# Patient Record
Sex: Female | Born: 1965
Health system: Northeastern US, Community
[De-identification: ages and names within clinical notes are randomized; demographics above are authoritative.]

## PROBLEM LIST (undated history)

## (undated) ENCOUNTER — Ambulatory Visit (HOSPITAL_BASED_OUTPATIENT_CLINIC_OR_DEPARTMENT_OTHER): Payer: Self-pay | Source: Ambulatory Visit | Admitting: Gastroenterology

## (undated) DIAGNOSIS — I471 Supraventricular tachycardia: Secondary | ICD-10-CM

## (undated) DIAGNOSIS — H5200 Hypermetropia, unspecified eye: Secondary | ICD-10-CM

## (undated) DIAGNOSIS — Z973 Presence of spectacles and contact lenses: Secondary | ICD-10-CM

## (undated) DIAGNOSIS — K219 Gastro-esophageal reflux disease without esophagitis: Secondary | ICD-10-CM

## (undated) DIAGNOSIS — N926 Irregular menstruation, unspecified: Secondary | ICD-10-CM

## (undated) DIAGNOSIS — H524 Presbyopia: Secondary | ICD-10-CM

## (undated) DIAGNOSIS — Z331 Pregnant state, incidental: Secondary | ICD-10-CM

## (undated) DIAGNOSIS — I519 Heart disease, unspecified: Secondary | ICD-10-CM

## (undated) DIAGNOSIS — H52203 Unspecified astigmatism, bilateral: Secondary | ICD-10-CM

## (undated) DIAGNOSIS — H31012 Macula scars of posterior pole (postinflammatory) (post-traumatic), left eye: Principal | ICD-10-CM

## (undated) DIAGNOSIS — H5203 Hypermetropia, bilateral: Secondary | ICD-10-CM

## (undated) HISTORY — DX: Supraventricular tachycardia: I47.1

## (undated) HISTORY — DX: Presbyopia: H52.4

## (undated) HISTORY — DX: Unspecified astigmatism, bilateral: H52.203

## (undated) HISTORY — DX: Hypermetropia, bilateral: H52.03

## (undated) HISTORY — DX: Macula scars of posterior pole (postinflammatory) (post-traumatic), left eye: H31.012

## (undated) HISTORY — DX: Hypermetropia, unspecified eye: H52.00

## (undated) HISTORY — PX: MA BREAST SPECIMEN: RAD3008

## (undated) HISTORY — DX: Heart disease, unspecified: I51.9

## (undated) HISTORY — PX: SEPTOPLASTY/SUBMUCOUS RESECJ W/WO CARTILAGE GRF: ENT31

## (undated) HISTORY — PX: BREAST MASS EXCISION: SHX1267

## (undated) HISTORY — PX: CESAREAN DELIVERY ONLY: REP300

## (undated) HISTORY — DX: Pregnant state, incidental: Z33.1

## (undated) HISTORY — DX: Irregular menstruation, unspecified: N92.6

## (undated) HISTORY — DX: Presence of spectacles and contact lenses: Z97.3

---
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
## 1999-04-20 ENCOUNTER — Ambulatory Visit (HOSPITAL_BASED_OUTPATIENT_CLINIC_OR_DEPARTMENT_OTHER): Payer: Self-pay

## 1999-04-20 LAB — URINALYSIS
BILIRUBIN, URINE: NEGATIVE
GLUCOSE, URINE: NEGATIVE MG/DL
KETONE, URINE: NEGATIVE MG/DL
LEUKOCYTE ESTERASE: NEGATIVE
NITRITE, URINE: NEGATIVE
OCCULT BLOOD, URINE: NEGATIVE
PH URINE: 5 (ref 5.0–8.0)
PROTEIN, URINE: NEGATIVE MG/DL
SPECIFIC GRAVITY URINE: 1.015 (ref 1.003–1.030)

## 1999-04-20 LAB — AUTOMATED WBC DIFF
BASOPHIL %: 0.5 % (ref 0–2)
EOSINOPHIL %: 0.8 % (ref 0–7)
LYMPHOCYTE %: 25.1 % (ref 12–39)
MONOCYTE %: 3.7 % (ref 1–12)
NEUTROPHIL %: 69.9 % (ref 46–79)

## 1999-04-20 LAB — BLOOD COUNT COMPLETE AUTOMATED
HEMATOCRIT: 40.9 % (ref 37.0–47.0)
HEMOGLOBIN: 14 g/dL (ref 12.0–16.0)
MEAN CORP HGB CONC: 34.3 g/dL (ref 32.0–36.0)
MEAN CORPUSCULAR HGB: 31.3 pg — ABNORMAL HIGH (ref 27.0–31.0)
MEAN CORPUSCULAR VOL: 91.3 fL (ref 81.0–99.0)
MEAN PLATELET VOLUME: 9.3 fL (ref 8.0–12.0)
PLATELET COUNT: 213 10*3/uL (ref 150–400)
RBC DISTRIBUTION WIDTH: 11.6 % (ref 11.5–14.3)
RED BLOOD CELL COUNT: 4.48 MIL/uL (ref 4.20–5.40)
WHITE BLOOD CELL COUNT: 9.4 10*3/uL (ref 4.8–10.8)

## 1999-04-20 LAB — HCG QUALITATIVE SERUM: HCG QUALITATIVE SERUM: NEGATIVE

## 1999-04-20 LAB — CHOLESTEROL SERUM/WHOLE BLOOD TOTAL: Cholesterol: 180 mg/dl (ref ?–200)

## 1999-04-20 LAB — RPR: RPR: NONREACTIVE

## 1999-04-20 LAB — THYROID SCREEN TSH REFLEX FT4: THYROID SCREEN TSH REFLEX FT4: 0.38 u[IU]/mL (ref 0.34–5.60)

## 1999-04-20 LAB — URINE CULTURE/COLONY COUNT: URINE CULTURE/COLONY COUNT: NO GROWTH

## 1999-04-27 ENCOUNTER — Ambulatory Visit (HOSPITAL_BASED_OUTPATIENT_CLINIC_OR_DEPARTMENT_OTHER): Payer: Self-pay

## 1999-04-27 LAB — DHEA SULFATE  (SENDOUT): DHEA SULFATE: 128 ug/dL (ref 45–270)

## 1999-04-27 LAB — IADNA CHLAMYDIA TRACHOMATIS DIRECT PROBE TQ: GENPROBE CHLAMYDIA: NEGATIVE

## 1999-04-27 LAB — ASSAY OF TESTOSTERONE  (SENDOUT)

## 1999-04-27 LAB — IADNA NEISSERIA GONORRHOEAE DIRECT PROBE TQ: GENPROBE GC: NEGATIVE

## 1999-04-27 LAB — CHG CYTP SLIDES CERV/VAG MNL SCRN PHYSICIAN SUPV

## 1999-04-27 LAB — ESTRADIOL  (SENDOUT): ESTRADIOL: 148 pg/ml (ref 19–528)

## 1999-04-27 LAB — CHG GONADOTROPIN LUTEINIZING HORMONE: LUTEINIZING HORMONE (LH): 75.81 m[IU]/mL

## 1999-04-27 LAB — HCG QUALITATIVE SERUM: HCG QUALITATIVE SERUM: NEGATIVE

## 1999-04-27 LAB — CHG GONADOTROPIN FOLLICLE STIMULATING HORMONE: FOLLICLE STIMULATING HORMONE: 33.34 m[IU]/mL

## 1999-10-06 ENCOUNTER — Ambulatory Visit (HOSPITAL_BASED_OUTPATIENT_CLINIC_OR_DEPARTMENT_OTHER): Payer: Self-pay

## 1999-10-06 LAB — RUBELLA IGG ANTIBODY: RUBELLA: NON-IMMUNE/NOT IMMUNE

## 1999-10-06 LAB — RPR: RPR: NONREACTIVE

## 2000-01-04 ENCOUNTER — Other Ambulatory Visit: Payer: Self-pay

## 2000-01-04 ENCOUNTER — Emergency Department (HOSPITAL_BASED_OUTPATIENT_CLINIC_OR_DEPARTMENT_OTHER): Payer: Self-pay

## 2000-01-04 LAB — AUTOMATED WBC DIFF
BASOPHIL %: 0.5 % (ref 0–2)
EOSINOPHIL %: 1.3 % (ref 0–7)
LYMPHOCYTE %: 27.2 % (ref 12–39)
MONOCYTE %: 4.5 % (ref 1–12)
NEUTROPHIL %: 66.5 % (ref 46–79)

## 2000-01-04 LAB — COMPLETE BLOOD COUNT
HEMATOCRIT: 35.5 % — ABNORMAL LOW (ref 37.0–47.0)
HEMOGLOBIN: 12.5 g/dL (ref 12.0–16.0)
MEAN CORP HGB CONC: 35.2 g/dL (ref 32.0–36.0)
MEAN CORPUSCULAR HGB: 32.2 pg — ABNORMAL HIGH (ref 27.0–31.0)
MEAN CORPUSCULAR VOL: 91.5 fL (ref 81.0–99.0)
RBC DISTRIBUTION WIDTH: 11.6 % (ref 11.5–14.3)
RED BLOOD CELL COUNT: 3.88 MIL/uL — ABNORMAL LOW (ref 4.20–5.40)
WHITE BLOOD CELL COUNT: 11.5 10*3/uL — ABNORMAL HIGH (ref 4.8–10.8)

## 2000-01-04 LAB — BASIC METABOLIC PANEL
BUN (UREA NITROGEN): 13 mg/dl (ref 10–20)
CALCIUM: 9.4 mg/dl (ref 8.5–10.5)
CARBON DIOXIDE: 23 mEQ/L (ref 22–32)
CHLORIDE: 106 mEQ/L (ref 98–110)
CREATININE: 0.5 mg/dl — ABNORMAL LOW (ref 0.8–1.2)
Glucose Random: 98 mg/dl (ref 65–160)
POTASSIUM: 3.8 mEQ/L (ref 3.5–5.0)
SODIUM: 137 mEQ/L (ref 135–145)

## 2000-01-04 LAB — TYPE AND SCREEN

## 2000-01-04 LAB — HCG QUALITATIVE SERUM: HCG QUALITATIVE SERUM: POSITIVE — ABNORMAL HIGH

## 2000-01-04 LAB — PATIENT ABO/RH CONFIRM

## 2000-01-04 LAB — HCG QUANTITATIVE: HCG QUANTITATIVE: 30654 m[IU]/mL — ABNORMAL HIGH (ref 1–3)

## 2000-01-05 ENCOUNTER — Other Ambulatory Visit: Payer: Self-pay | Admitting: Obstetrics & Gynecology

## 2000-01-11 ENCOUNTER — Ambulatory Visit (HOSPITAL_BASED_OUTPATIENT_CLINIC_OR_DEPARTMENT_OTHER): Payer: Self-pay | Admitting: Obstetrics & Gynecology

## 2000-01-11 LAB — IADNA CHLAMYDIA TRACHOMATIS DIRECT PROBE TQ: GENPROBE CHLAMYDIA: NEGATIVE

## 2000-01-11 LAB — CHG CYTP SLIDES CERV/VAG MNL SCRN PHYSICIAN SUPV

## 2000-01-11 LAB — URINE CULTURE/COLONY COUNT: PROCEDURE PROMPT: NO GROWTH

## 2000-01-11 LAB — IADNA NEISSERIA GONORRHOEAE DIRECT PROBE TQ: GENPROBE GC: NEGATIVE

## 2000-01-20 ENCOUNTER — Ambulatory Visit (HOSPITAL_BASED_OUTPATIENT_CLINIC_OR_DEPARTMENT_OTHER): Payer: Self-pay | Admitting: Obstetrics & Gynecology

## 2000-01-20 LAB — ALPHA FETOPROTEIN PLUS PROFILE
AFP MoM VALUE: 0.49
AFP Value (EIA): 16.6 ng/ml
DOWN SYND 2ND TRIMESTER RISK: 1:1218 {titer}
DOWN SYND MATERNAL AGE RISK: 1:223 {titer}
GESTATIONAL AGE: 15.3 WEEKS
HCG MoM: 0.59
HCG Value: 21351 m[IU]/mL
MATERNAL AGE AT EDD: 36.3 YEARS
MATERNAL WEIGHT: 157 [lb_av]
OPEN SPINA BIFIDA RISK FACTOR: 1:10000 {titer}
UNCONJ ESTRIOL MoM: 0.43
UNCONJUGATED ESTRIOL VALUE: 1 ng/ml

## 2000-01-20 LAB — URINE CULTURE/COLONY COUNT: URINE CULTURE/COLONY COUNT: NO GROWTH

## 2000-01-27 ENCOUNTER — Ambulatory Visit (HOSPITAL_BASED_OUTPATIENT_CLINIC_OR_DEPARTMENT_OTHER): Payer: Self-pay | Admitting: Obstetrics & Gynecology

## 2000-02-02 ENCOUNTER — Ambulatory Visit (HOSPITAL_BASED_OUTPATIENT_CLINIC_OR_DEPARTMENT_OTHER): Payer: Self-pay | Admitting: Obstetrics & Gynecology

## 2000-02-15 ENCOUNTER — Ambulatory Visit (HOSPITAL_BASED_OUTPATIENT_CLINIC_OR_DEPARTMENT_OTHER): Payer: Self-pay | Admitting: Obstetrics & Gynecology

## 2000-03-14 ENCOUNTER — Ambulatory Visit (HOSPITAL_BASED_OUTPATIENT_CLINIC_OR_DEPARTMENT_OTHER): Payer: Self-pay | Admitting: Obstetrics & Gynecology

## 2000-04-13 ENCOUNTER — Ambulatory Visit (HOSPITAL_BASED_OUTPATIENT_CLINIC_OR_DEPARTMENT_OTHER): Payer: Self-pay | Admitting: Obstetrics & Gynecology

## 2000-04-25 ENCOUNTER — Ambulatory Visit (HOSPITAL_BASED_OUTPATIENT_CLINIC_OR_DEPARTMENT_OTHER): Payer: Self-pay | Admitting: Obstetrics & Gynecology

## 2000-05-07 ENCOUNTER — Ambulatory Visit: Payer: Self-pay | Admitting: Orthopaedic Surgery

## 2000-05-23 ENCOUNTER — Ambulatory Visit (HOSPITAL_BASED_OUTPATIENT_CLINIC_OR_DEPARTMENT_OTHER): Payer: Self-pay | Admitting: Obstetrics & Gynecology

## 2000-06-06 ENCOUNTER — Other Ambulatory Visit (HOSPITAL_BASED_OUTPATIENT_CLINIC_OR_DEPARTMENT_OTHER): Payer: Self-pay | Admitting: Obstetrics & Gynecology

## 2000-06-08 ENCOUNTER — Ambulatory Visit (HOSPITAL_BASED_OUTPATIENT_CLINIC_OR_DEPARTMENT_OTHER): Payer: Self-pay | Admitting: Obstetrics & Gynecology

## 2000-06-13 ENCOUNTER — Ambulatory Visit (HOSPITAL_BASED_OUTPATIENT_CLINIC_OR_DEPARTMENT_OTHER): Payer: Self-pay | Admitting: Obstetrics & Gynecology

## 2000-06-15 ENCOUNTER — Ambulatory Visit (HOSPITAL_BASED_OUTPATIENT_CLINIC_OR_DEPARTMENT_OTHER): Payer: Self-pay | Admitting: Obstetrics & Gynecology

## 2000-06-22 ENCOUNTER — Ambulatory Visit (HOSPITAL_BASED_OUTPATIENT_CLINIC_OR_DEPARTMENT_OTHER): Payer: Self-pay | Admitting: Obstetrics & Gynecology

## 2000-06-27 ENCOUNTER — Ambulatory Visit (HOSPITAL_BASED_OUTPATIENT_CLINIC_OR_DEPARTMENT_OTHER): Payer: Self-pay | Admitting: Obstetrics & Gynecology

## 2000-07-02 ENCOUNTER — Ambulatory Visit (HOSPITAL_BASED_OUTPATIENT_CLINIC_OR_DEPARTMENT_OTHER): Payer: Self-pay | Admitting: Obstetrics & Gynecology

## 2000-07-02 ENCOUNTER — Other Ambulatory Visit (HOSPITAL_BASED_OUTPATIENT_CLINIC_OR_DEPARTMENT_OTHER): Payer: Self-pay | Admitting: Obstetrics & Gynecology

## 2000-07-02 LAB — TYPE AND SCREEN

## 2000-07-02 LAB — ~~LOC~~-OPERATIVE REPORT

## 2000-07-06 LAB — PERFUSION LUNG IMAGES

## 2000-07-06 LAB — XR CHEST 2 VIEWS

## 2000-07-10 ENCOUNTER — Ambulatory Visit (HOSPITAL_BASED_OUTPATIENT_CLINIC_OR_DEPARTMENT_OTHER): Payer: Self-pay

## 2000-07-11 ENCOUNTER — Ambulatory Visit (HOSPITAL_BASED_OUTPATIENT_CLINIC_OR_DEPARTMENT_OTHER): Payer: Self-pay | Admitting: Plastic Surgery

## 2000-07-13 ENCOUNTER — Ambulatory Visit (HOSPITAL_BASED_OUTPATIENT_CLINIC_OR_DEPARTMENT_OTHER): Payer: Self-pay | Admitting: Obstetrics & Gynecology

## 2000-07-13 ENCOUNTER — Ambulatory Visit (HOSPITAL_BASED_OUTPATIENT_CLINIC_OR_DEPARTMENT_OTHER): Payer: Self-pay

## 2000-07-16 LAB — XR CHEST 2 VIEWS

## 2000-07-18 ENCOUNTER — Ambulatory Visit (HOSPITAL_BASED_OUTPATIENT_CLINIC_OR_DEPARTMENT_OTHER): Payer: Self-pay | Admitting: Obstetrics & Gynecology

## 2000-07-31 ENCOUNTER — Ambulatory Visit (HOSPITAL_BASED_OUTPATIENT_CLINIC_OR_DEPARTMENT_OTHER): Payer: Self-pay

## 2000-07-31 ENCOUNTER — Emergency Department (HOSPITAL_BASED_OUTPATIENT_CLINIC_OR_DEPARTMENT_OTHER): Payer: Self-pay | Admitting: Emergency Medical Services

## 2000-08-01 ENCOUNTER — Ambulatory Visit (HOSPITAL_BASED_OUTPATIENT_CLINIC_OR_DEPARTMENT_OTHER): Payer: Self-pay | Admitting: Plastic Surgery

## 2000-08-15 ENCOUNTER — Other Ambulatory Visit (HOSPITAL_BASED_OUTPATIENT_CLINIC_OR_DEPARTMENT_OTHER): Payer: Self-pay

## 2000-08-15 ENCOUNTER — Ambulatory Visit (HOSPITAL_BASED_OUTPATIENT_CLINIC_OR_DEPARTMENT_OTHER): Payer: Self-pay | Admitting: Obstetrics & Gynecology

## 2000-08-15 LAB — IADNA NEISSERIA GONORRHOEAE DIRECT PROBE TQ: GENPROBE GC: NEGATIVE

## 2000-08-15 LAB — CYTOPATH, C/V, THIN LAYER

## 2000-08-15 LAB — IADNA CHLAMYDIA TRACHOMATIS DIRECT PROBE TQ: GENPROBE CHLAMYDIA: NEGATIVE

## 2000-08-22 ENCOUNTER — Ambulatory Visit (HOSPITAL_BASED_OUTPATIENT_CLINIC_OR_DEPARTMENT_OTHER): Payer: Self-pay | Admitting: Plastic Surgery

## 2000-08-24 ENCOUNTER — Ambulatory Visit (HOSPITAL_BASED_OUTPATIENT_CLINIC_OR_DEPARTMENT_OTHER): Payer: Self-pay

## 2000-09-19 ENCOUNTER — Ambulatory Visit (HOSPITAL_BASED_OUTPATIENT_CLINIC_OR_DEPARTMENT_OTHER): Payer: Self-pay | Admitting: Plastic Surgery

## 2000-10-19 ENCOUNTER — Ambulatory Visit (HOSPITAL_BASED_OUTPATIENT_CLINIC_OR_DEPARTMENT_OTHER): Payer: Self-pay

## 2001-01-03 ENCOUNTER — Ambulatory Visit (HOSPITAL_BASED_OUTPATIENT_CLINIC_OR_DEPARTMENT_OTHER): Payer: Self-pay

## 2001-01-15 ENCOUNTER — Ambulatory Visit (HOSPITAL_BASED_OUTPATIENT_CLINIC_OR_DEPARTMENT_OTHER): Payer: Self-pay

## 2001-03-04 ENCOUNTER — Ambulatory Visit (HOSPITAL_BASED_OUTPATIENT_CLINIC_OR_DEPARTMENT_OTHER): Payer: Self-pay | Admitting: Plastic Surgery

## 2001-07-30 ENCOUNTER — Ambulatory Visit (HOSPITAL_BASED_OUTPATIENT_CLINIC_OR_DEPARTMENT_OTHER): Payer: Self-pay

## 2001-07-30 LAB — IADNA CHLAMYDIA TRACHOMATIS DIRECT PROBE TQ: GENPROBE CHLAMYDIA: NEGATIVE

## 2001-07-30 LAB — CYTOPATH, C/V, THIN LAYER

## 2001-07-30 LAB — IADNA NEISSERIA GONORRHOEAE DIRECT PROBE TQ: GENPROBE GC: NEGATIVE

## 2001-08-15 ENCOUNTER — Ambulatory Visit (HOSPITAL_BASED_OUTPATIENT_CLINIC_OR_DEPARTMENT_OTHER): Payer: Self-pay | Admitting: Internal Medicine

## 2001-08-16 ENCOUNTER — Emergency Department (HOSPITAL_BASED_OUTPATIENT_CLINIC_OR_DEPARTMENT_OTHER): Payer: Self-pay

## 2001-08-26 ENCOUNTER — Ambulatory Visit (HOSPITAL_BASED_OUTPATIENT_CLINIC_OR_DEPARTMENT_OTHER): Payer: Self-pay

## 2001-08-30 ENCOUNTER — Other Ambulatory Visit (HOSPITAL_BASED_OUTPATIENT_CLINIC_OR_DEPARTMENT_OTHER): Payer: Self-pay

## 2001-09-03 LAB — US PELVIC NON-PREGNANT

## 2001-09-03 LAB — US TRANSVAGINAL NON-OB

## 2001-12-11 ENCOUNTER — Ambulatory Visit (HOSPITAL_BASED_OUTPATIENT_CLINIC_OR_DEPARTMENT_OTHER): Payer: Self-pay | Admitting: Plastic Surgery

## 2001-12-25 ENCOUNTER — Ambulatory Visit (HOSPITAL_BASED_OUTPATIENT_CLINIC_OR_DEPARTMENT_OTHER): Payer: Self-pay | Admitting: Plastic Surgery

## 2002-01-02 ENCOUNTER — Ambulatory Visit (HOSPITAL_BASED_OUTPATIENT_CLINIC_OR_DEPARTMENT_OTHER): Payer: Self-pay | Admitting: Family Medicine

## 2002-01-02 ENCOUNTER — Encounter (HOSPITAL_BASED_OUTPATIENT_CLINIC_OR_DEPARTMENT_OTHER): Payer: Self-pay

## 2002-01-02 ENCOUNTER — Encounter (HOSPITAL_BASED_OUTPATIENT_CLINIC_OR_DEPARTMENT_OTHER): Payer: Self-pay | Admitting: Family Medicine

## 2002-01-10 ENCOUNTER — Ambulatory Visit: Payer: Self-pay

## 2002-01-22 ENCOUNTER — Ambulatory Visit (HOSPITAL_BASED_OUTPATIENT_CLINIC_OR_DEPARTMENT_OTHER): Payer: Self-pay | Admitting: Plastic Surgery

## 2002-04-15 ENCOUNTER — Other Ambulatory Visit (HOSPITAL_BASED_OUTPATIENT_CLINIC_OR_DEPARTMENT_OTHER): Payer: Self-pay

## 2002-05-17 LAB — BLOOD COUNT COMPLETE AUTO&AUTO DIFRNTL WBC
BASOPHIL %: 0.1 % (ref 0–2)
EOSINOPHIL %: 3.1 % (ref 0–7)
HEMATOCRIT: 39.3 % (ref 37.0–47.0)
HEMOGLOBIN: 12.7 g/dL (ref 12.0–16.0)
LYMPHOCYTE %: 25.4 % (ref 12–39)
MEAN CORP HGB CONC: 32.4 g/dL (ref 32.0–36.0)
MEAN CORPUSCULAR HGB: 28.1 pg (ref 27.0–31.0)
MEAN CORPUSCULAR VOL: 86.8 fL (ref 81.0–99.0)
MEAN PLATELET VOLUME: 8.5 fL (ref 8.0–12.0)
MONOCYTE %: 12.6 % — ABNORMAL HIGH (ref 1–12)
NEUTROPHIL %: 58.8 % (ref 46–79)
PLATELET COUNT: 240 10*3/uL (ref 150–400)
RBC DISTRIBUTION WIDTH: 12.6 % (ref 11.5–14.3)
RED BLOOD CELL COUNT: 4.52 MIL/uL (ref 4.20–5.40)
WHITE BLOOD CELL COUNT: 6.2 10*3/uL (ref 4.8–10.8)

## 2002-05-17 LAB — GLUCOSE 1 HR POST 50 GRAM DOSE: GLUCOSE 1 HR POST 50 GRAM DOSE: 108 mg/dl (ref 65–135)

## 2002-05-17 LAB — DIFFERENTIAL WBC COUNT
ATYPICAL LYMPHOCYTES %: 10 % (ref 0–12)
BAND NEUTROPHILS %: 2 % (ref 0–8)
EOSINOPHILS %: 1 % (ref 0–6)
LYMPHOCYTES %: 22 % (ref 12–40)
MONOCYTES %: 2 % (ref 1–11)
PLATELET ESTIMATE: NORMAL
POLYMORPHONUCLEAR (SEGS) %: 63 % (ref 41–77)

## 2002-05-17 LAB — CHG HEPATIC FUNCTION PANEL
ALANINE AMINOTRANSFERASE: 25 IU/L (ref 3–36)
ALBUMIN: 4 g/dl (ref 3.5–5.0)
ALKALINE PHOSPHATASE: 96 IU/L (ref 50–136)
ASPARTATE AMINOTRANSFERASE: 28 U/L (ref 7–29)
BILIRUBIN DIRECT: 0 mg/dl (ref 0–0.36)
BILIRUBIN TOTAL: 0.4 mg/dl (ref 0.15–1.0)
TOTAL PROTEIN: 7.3 g/dl (ref 6.2–8.5)

## 2002-05-17 LAB — COMPLETE BLOOD COUNT
HEMATOCRIT: 35.3 % — ABNORMAL LOW (ref 37.0–47.0)
HEMATOCRIT: 39.4 % (ref 37.0–47.0)
HEMOGLOBIN: 12.2 g/dL (ref 12.0–16.0)
HEMOGLOBIN: 13.8 g/dL (ref 12.0–16.0)
MEAN CORP HGB CONC: 34.7 g/dL (ref 32.0–36.0)
MEAN CORP HGB CONC: 35 g/dL (ref 32.0–36.0)
MEAN CORPUSCULAR HGB: 29.9 pg (ref 27.0–31.0)
MEAN CORPUSCULAR HGB: 30.6 pg (ref 27.0–31.0)
MEAN CORPUSCULAR VOL: 85.5 fL (ref 81.0–99.0)
MEAN CORPUSCULAR VOL: 88.2 fL (ref 81.0–99.0)
RBC DISTRIBUTION WIDTH: 12.7 % (ref 11.5–14.3)
RBC DISTRIBUTION WIDTH: 13 % (ref 11.5–14.3)
RED BLOOD CELL COUNT: 4 MIL/uL — ABNORMAL LOW (ref 4.20–5.40)
RED BLOOD CELL COUNT: 4.6 MIL/uL (ref 4.20–5.40)
WHITE BLOOD CELL COUNT: 12.3 10*3/uL — ABNORMAL HIGH (ref 4.8–10.8)
WHITE BLOOD CELL COUNT: 6.4 10*3/uL (ref 4.8–10.8)

## 2002-05-17 LAB — BLOOD COUNT COMPLETE AUTOMATED
HEMATOCRIT: 31.1 % — ABNORMAL LOW (ref 37.0–47.0)
HEMATOCRIT: 45.2 % (ref 37.0–47.0)
HEMOGLOBIN: 10.9 g/dL — ABNORMAL LOW (ref 12.0–16.0)
HEMOGLOBIN: 15.3 g/dL (ref 12.0–16.0)
MEAN CORP HGB CONC: 33.8 g/dL (ref 32.0–36.0)
MEAN CORP HGB CONC: 34.9 g/dL (ref 32.0–36.0)
MEAN CORPUSCULAR HGB: 30.2 pg (ref 27.0–31.0)
MEAN CORPUSCULAR HGB: 31.9 pg — ABNORMAL HIGH (ref 27.0–31.0)
MEAN CORPUSCULAR VOL: 89.3 fL (ref 81.0–99.0)
MEAN CORPUSCULAR VOL: 91.1 fL (ref 81.0–99.0)
MEAN PLATELET VOLUME: 8.5 fL (ref 8.0–12.0)
MEAN PLATELET VOLUME: 9.6 fL (ref 6.4–10.8)
PLATELET COUNT: 232 10*3/uL (ref 150–400)
PLATELET COUNT: 233 10*3/uL (ref 150–400)
RBC DISTRIBUTION WIDTH: 11.3 % — ABNORMAL LOW (ref 11.5–14.3)
RBC DISTRIBUTION WIDTH: 12.2 % (ref 11.5–14.3)
RED BLOOD CELL COUNT: 3.41 MIL/uL — ABNORMAL LOW (ref 4.20–5.40)
RED BLOOD CELL COUNT: 5.06 MIL/uL (ref 4.20–5.40)
WHITE BLOOD CELL COUNT: 11 10*3/uL — ABNORMAL HIGH (ref 4.8–10.8)
WHITE BLOOD CELL COUNT: 11.5 10*3/uL — ABNORMAL HIGH (ref 4.8–10.8)

## 2002-05-17 LAB — BASIC METABOLIC PANEL
BUN (UREA NITROGEN): 13 mg/dl (ref 10–20)
BUN (UREA NITROGEN): 8 mg/dl — ABNORMAL LOW (ref 10–20)
CALCIUM: 9.1 mg/dl (ref 8.5–10.5)
CALCIUM: 9.7 mg/dl (ref 8.5–10.5)
CARBON DIOXIDE: 22 mEQ/L (ref 22–32)
CARBON DIOXIDE: 26 mEQ/L (ref 22–32)
CHLORIDE: 108 mEQ/L (ref 98–110)
CHLORIDE: 108 mEQ/L (ref 98–110)
CREATININE: 0.7 mg/dl — ABNORMAL LOW (ref 0.8–1.2)
CREATININE: 0.7 mg/dl — ABNORMAL LOW (ref 0.8–1.2)
Glucose Random: 100 mg/dl (ref 65–160)
Glucose Random: 89 mg/dl (ref 65–160)
POTASSIUM: 3.5 mEQ/L (ref 3.5–5.0)
POTASSIUM: 4.3 mEQ/L (ref 3.5–5.0)
SODIUM: 137 mEQ/L (ref 135–145)
SODIUM: 138 mEQ/L (ref 135–145)

## 2002-05-17 LAB — ASSAY OF PHOSPHATASE ALKALINE: ALKALINE PHOSPHATASE: 100 IU/L (ref 50–136)

## 2002-05-17 LAB — HELICOBACTER PYLORI IGG: HELICOBACTER PYLORI IGG: NEGATIVE

## 2002-05-17 LAB — CHG SEDIMENTATION RATE RBC NON-AUTOMATED: RBC SEDIMENTATION RATE: 1 MM/HR (ref 0–15)

## 2002-05-17 LAB — AMYLASE
AMYLASE: 47 U/L (ref 25–130)
AMYLASE: 52 U/L (ref 25–130)

## 2002-05-17 LAB — CHG ASSAY OF FERRITIN: FERRITIN: 32 ng/ml (ref 11–307)

## 2002-05-17 LAB — HCG QUALITATIVE SERUM: HCG QUALITATIVE SERUM: NEGATIVE

## 2002-05-17 LAB — ANTINUCLEAR ANTIBODY: ANTINUCLEAR ANTIBODY: NEGATIVE

## 2002-05-17 LAB — TRANSFERASE ALANINE AMINO ALT SGPT: ALANINE AMINOTRANSFERASE: 26 IU/L (ref 3–36)

## 2002-05-17 LAB — HOLD BLUE TOP TUBE

## 2002-05-17 LAB — TSH (THYROID STIMULATING HORMONE): TSH (THYROID STIM HORMONE): 0.72 u[IU]/mL (ref 0.34–5.60)

## 2002-05-17 LAB — RPR: RPR: NONREACTIVE

## 2002-05-17 LAB — LIPASE: LIPASE: 22 U/L (ref 8–57)

## 2002-07-15 ENCOUNTER — Encounter (HOSPITAL_BASED_OUTPATIENT_CLINIC_OR_DEPARTMENT_OTHER): Payer: Self-pay

## 2002-07-17 ENCOUNTER — Encounter (HOSPITAL_BASED_OUTPATIENT_CLINIC_OR_DEPARTMENT_OTHER): Payer: Self-pay | Admitting: Internal Medicine

## 2002-07-17 ENCOUNTER — Ambulatory Visit (HOSPITAL_BASED_OUTPATIENT_CLINIC_OR_DEPARTMENT_OTHER): Payer: Self-pay | Admitting: Internal Medicine

## 2002-07-17 NOTE — Progress Notes (Signed)
abstracted by [email or web address] 09/03/02

## 2002-08-05 ENCOUNTER — Ambulatory Visit (HOSPITAL_BASED_OUTPATIENT_CLINIC_OR_DEPARTMENT_OTHER): Payer: Self-pay

## 2002-08-05 LAB — HEMOGLOBIN: HEMOGLOBIN: 13.1 g/dL (ref 12.0–16.0)

## 2002-08-05 LAB — CHOLESTEROL SERUM/WHOLE BLOOD TOTAL: Cholesterol: 194 mg/dl (ref 0–200)

## 2002-08-05 LAB — CYTOPATH, C/V, THIN LAYER

## 2002-08-05 LAB — BASIC METABOLIC PANEL
BUN (UREA NITROGEN): 10 mg/dl (ref 10–20)
CALCIUM: 9.3 mg/dl (ref 8.5–10.5)
CARBON DIOXIDE: 27 mEQ/L (ref 22–32)
CHLORIDE: 107 mEQ/L (ref 98–110)
CREATININE: 0.7 mg/dl — ABNORMAL LOW (ref 0.8–1.2)
Glucose Random: 77 mg/dl (ref 65–160)
POTASSIUM: 4.1 mEQ/L (ref 3.5–5.0)
SODIUM: 139 mEQ/L (ref 135–145)

## 2002-08-05 LAB — HEMATOCRIT: HEMATOCRIT: 37.6 % (ref 37.0–47.0)

## 2002-08-05 LAB — CHG LIPOPROTEIN DIR MEAS HIGH DENSITY CHOLESTEROL: HIGH DENSITY LIPOPROTEIN: 51 mg/dl (ref 35–95)

## 2002-08-05 LAB — CHG LIPOPROTEIN DIRECT MEASUREMENT LDL CHOLESTEROL: LOW DENSITY LIPOPROTEIN DIRECT: 127 mg/dl (ref ?–130)

## 2002-08-06 LAB — IADNA CHLAMYDIA TRACHOMATIS DIRECT PROBE TQ: GENPROBE CHLAMYDIA: NEGATIVE

## 2002-08-06 LAB — IADNA NEISSERIA GONORRHOEAE DIRECT PROBE TQ: GENPROBE GC: NEGATIVE

## 2002-09-03 ENCOUNTER — Encounter (HOSPITAL_BASED_OUTPATIENT_CLINIC_OR_DEPARTMENT_OTHER): Payer: Self-pay | Admitting: Internal Medicine

## 2002-10-20 ENCOUNTER — Ambulatory Visit (HOSPITAL_BASED_OUTPATIENT_CLINIC_OR_DEPARTMENT_OTHER): Payer: Self-pay | Admitting: Nurse Practitioner

## 2002-10-21 LAB — BLOOD COUNT COMPLETE AUTOMATED
HEMATOCRIT: 37.8 % (ref 37.0–47.0)
HEMOGLOBIN: 12.9 g/dL (ref 12.0–16.0)
MEAN CORP HGB CONC: 34.2 g/dL (ref 32.0–36.0)
MEAN CORPUSCULAR HGB: 31 pg (ref 27.0–31.0)
MEAN CORPUSCULAR VOL: 90.8 fL (ref 81.0–99.0)
MEAN PLATELET VOLUME: 9 fL (ref 6.4–10.8)
PLATELET COUNT: 216 10*3/uL (ref 150–400)
RBC DISTRIBUTION WIDTH: 11.1 % — ABNORMAL LOW (ref 11.5–14.3)
RED BLOOD CELL COUNT: 4.16 MIL/uL — ABNORMAL LOW (ref 4.20–5.40)
WHITE BLOOD CELL COUNT: 8.5 10*3/uL (ref 4.8–10.8)

## 2002-10-21 LAB — TSH (THYROID STIMULATING HORMONE): TSH (THYROID STIM HORMONE): 0.79 u[IU]/mL (ref 0.34–5.60)

## 2002-11-24 ENCOUNTER — Ambulatory Visit (HOSPITAL_BASED_OUTPATIENT_CLINIC_OR_DEPARTMENT_OTHER): Payer: Self-pay | Admitting: Nurse Practitioner

## 2002-12-22 ENCOUNTER — Encounter (HOSPITAL_BASED_OUTPATIENT_CLINIC_OR_DEPARTMENT_OTHER): Payer: Self-pay

## 2003-02-07 ENCOUNTER — Emergency Department (HOSPITAL_BASED_OUTPATIENT_CLINIC_OR_DEPARTMENT_OTHER): Payer: Self-pay | Admitting: Emergency Medicine

## 2003-11-03 ENCOUNTER — Encounter (HOSPITAL_BASED_OUTPATIENT_CLINIC_OR_DEPARTMENT_OTHER): Payer: Self-pay

## 2003-11-03 ENCOUNTER — Ambulatory Visit (HOSPITAL_BASED_OUTPATIENT_CLINIC_OR_DEPARTMENT_OTHER): Payer: Charity

## 2003-11-03 VITALS — BP 102/78 | HR 90 | Ht 61.0 in | Wt 159.0 lb

## 2003-11-03 DIAGNOSIS — Z01419 Encounter for gynecological examination (general) (routine) without abnormal findings: Secondary | ICD-10-CM

## 2003-11-03 DIAGNOSIS — N912 Amenorrhea, unspecified: Secondary | ICD-10-CM

## 2003-11-03 LAB — CYTOPATH, C/V, THIN LAYER

## 2003-11-03 LAB — URINE PREGNANCY TEST (POINT OF CARE): HCG QUALITATIVE URINE: NEGATIVE

## 2003-11-03 NOTE — Progress Notes (Signed)
Due to language barrier, an interpreter was present during the history-taking and subsequent discussion (and for part of the physical exam) with this patient.    SUBJECTIVE:   38 year old female for annual routine pap and checkup. Stopped taking OCP's two months ago and has had no menstual period yet. This happened last year too and resolved spontaneously.    Current outpatient prescriptions:  PROTONIX 40 MG OR TBEC, 1 q hs, Disp: 30, Rfl: 0  MOTRIN 800 MG OR TABS, 1 t.i.d. p.r.n., Disp: 90, Rfl: 0  ALESSE (21) 0.1-20 MG-MCG OR TABS, 1 TABLET DAILY FOR 21 DAYS, Disp: 3, Rfl: 0      Allergies: Fluconazole   Patient's last menstrual period was 09/09/2003.    ROS: Feeling well. No dyspnea or chest pain on exertion. No abdominal pain, change in bowel habits, black or bloody stools. No urinary tract symptoms. GYN ROS: no breast pain or new or enlarging lumps on self exam, no discharge or pelvic pain, amenorhea.    OBJECTIVE:   The patient appears well, in NAD.   BP 102/78   Pulse 90   Ht 5\' 1"  (1.29m)   Wt 159 lbs (72.1kg)   LMP 09/09/2003  ENT normal. Neck supple. No adenopathy or thyromegaly. PERLA. Clear, good air entry, no wheezes, rhonci or rales. S1 and S2 normal, no murmurs, regular rate and rhythm. No edema. Abdomen soft without tenderness, guarding, mass or organomegaly.    BREAST EXAM: normal without suspicious masses, skin or nipple changes or axillary nodes, symmetric fibrous changes in both upper outer quadrants, self-exam is taught and encouraged    PELVIC EXAM: exam chaperoned by nurse, EGBUS within normal limits, normal cervix without lesions, polyps or tenderness, uterus normal size, shape, consistency, no mass or tenderness, adnexa normal in size without mass or tenderness    Urine preg- negative    ASSESSMENT:   well woman    PLAN:     pap smear  return annually or prn  If amenorhea continues beyond another month RTC.

## 2003-11-20 ENCOUNTER — Encounter (HOSPITAL_BASED_OUTPATIENT_CLINIC_OR_DEPARTMENT_OTHER): Payer: Self-pay

## 2004-02-18 ENCOUNTER — Ambulatory Visit (HOSPITAL_BASED_OUTPATIENT_CLINIC_OR_DEPARTMENT_OTHER): Payer: Self-pay | Admitting: Registered Nurse

## 2004-02-18 VITALS — Temp 99.6°F

## 2004-02-18 DIAGNOSIS — Z23 Encounter for immunization: Secondary | ICD-10-CM

## 2004-02-25 ENCOUNTER — Other Ambulatory Visit (HOSPITAL_BASED_OUTPATIENT_CLINIC_OR_DEPARTMENT_OTHER): Payer: Self-pay | Admitting: Family Medicine

## 2004-04-26 ENCOUNTER — Encounter (HOSPITAL_BASED_OUTPATIENT_CLINIC_OR_DEPARTMENT_OTHER): Payer: Self-pay

## 2004-04-26 ENCOUNTER — Ambulatory Visit (HOSPITAL_BASED_OUTPATIENT_CLINIC_OR_DEPARTMENT_OTHER): Payer: Charity

## 2004-04-26 VITALS — BP 112/82

## 2004-04-26 DIAGNOSIS — Z3009 Encounter for other general counseling and advice on contraception: Secondary | ICD-10-CM | POA: Insufficient documentation

## 2004-04-26 MED ORDER — ALESSE (28) 0.1-20 MG-MCG PO TABS
ORAL_TABLET | ORAL | Status: DC
Start: 2004-04-26 — End: 2004-11-10

## 2004-04-26 NOTE — Progress Notes (Signed)
Progress note and plan reviewed  Approved  Daivik Overley, MD

## 2004-04-26 NOTE — Progress Notes (Signed)
ORAL CONTRACEPTIVE REVISIT  Counseling Notes     Since last annual visit, any significant changes in medical history (hospitalizations or surgery (past or planned), serious illnesses, new medications, smoking habits, social or sexual history, etc.)? No    Any problems/side effects? No.    Warning signs ("A C H E S") reviewed: yes    Taking pills correctly and consistently? Yes  Satisfied with method/wants to continue? Yes  Any questions or concerns about the method? No  Method specific counseling done at previous visit (s)? yes  STD/HIV counseling and risk assessment done/updated: yes  Consent forms signed and up-to-date: yes  Emergency contraception education/counseling done: yes  EC consent signed? N/A  Comments: Deborah [name] is a 39 year old female  requesting OCP refill. Pt. denies any problem or side-effects.  Pt. states late start OCP. Supposed start new pack last Sunday 04/24/2004. Pt. was instructed to start new OCP pack today and doubled for two days after take one pill every day at same time and use condoms as a back up method for one week.  Pt. Last pap 11/03/2003 - negative  non smoker - quit 2 yrs ago  Pt. case discussed with Dr. Ihor Dow  7 packs of Alesse were given to the pt.   Pt. can call [facility] if has any questions or concerns.

## 2004-06-13 ENCOUNTER — Ambulatory Visit (HOSPITAL_BASED_OUTPATIENT_CLINIC_OR_DEPARTMENT_OTHER): Payer: Charity

## 2004-06-13 ENCOUNTER — Ambulatory Visit (HOSPITAL_BASED_OUTPATIENT_CLINIC_OR_DEPARTMENT_OTHER): Payer: Self-pay

## 2004-06-13 ENCOUNTER — Encounter (HOSPITAL_BASED_OUTPATIENT_CLINIC_OR_DEPARTMENT_OTHER): Payer: Self-pay

## 2004-06-13 VITALS — BP 110/80 | HR 83 | Wt 161.0 lb

## 2004-06-13 DIAGNOSIS — M549 Dorsalgia, unspecified: Secondary | ICD-10-CM

## 2004-06-13 LAB — XR LUMBAR SPINE 2 OR 3 VIEWS

## 2004-06-13 MED ORDER — INDOMETHACIN 50 MG PO CAPS
ORAL_CAPSULE | ORAL | Status: DC
Start: 2004-06-13 — End: 2004-11-10

## 2004-06-13 NOTE — Progress Notes (Signed)
Due to language barrier, an interpreter was present during the history-taking and subsequent discussion (and for part of the physical exam) with this patient.    SUBJECTIVE: Deborah [name] is a 39 year old female who complains of back pain for the last 2 weeks.    SUBJECTIVE:   Deborah [name] is a 39 year old female who complains of low back pain for 2 weeks, positional with bending or lifting, without radiation down the legs. If she doesn't move around it seems to get worse. Precipitating factors: none recalled by the patient. Prior history of back problems: recurrent self limited episodes of low back pain in the past. There is no numbness in the legs. Pain meds- ibuprofen 400mg  TID, doesn't help much. 9.5/10. 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    OBJECTIVE:  BP 110/70   Pulse 83   Wt 161 lbs (73.0kg)   LMP 06/10/2004   Patient appears to be in mild to moderate pain, antalgic gait noted. Lumbosacral spine area reveals no local tenderness or mass. Painful and reduced LS ROM noted. Straight leg raise is negative at 45 degrees on bilateral. DTR's, motor strength and sensation normal, including heel and toe gait. Peripheral pulses are palpable. X-Ray: ordered, but results not yet available.    ASSESSMENT:   lumbar strain    PLAN:  For acute pain, rest, intermittent application of heat (do not sleep on heating pad), analgesics and muscle relaxants are recommended. Discussed longer term treatment plan of prn NSAID's and discussed a home back care exercise program with flexion exercise routine. Proper lifting with avoidance of heavy lifting discussed. Consider Physical Therapy if not improving. Call or return to clinic prn if these symptoms worsen or fail to improve as anticipated.

## 2004-06-14 ENCOUNTER — Encounter (HOSPITAL_BASED_OUTPATIENT_CLINIC_OR_DEPARTMENT_OTHER): Payer: Self-pay

## 2004-06-21 ENCOUNTER — Ambulatory Visit (HOSPITAL_BASED_OUTPATIENT_CLINIC_OR_DEPARTMENT_OTHER): Payer: Charity

## 2004-06-21 ENCOUNTER — Encounter (HOSPITAL_BASED_OUTPATIENT_CLINIC_OR_DEPARTMENT_OTHER): Payer: Self-pay

## 2004-06-21 VITALS — BP 102/70 | HR 72

## 2004-06-21 DIAGNOSIS — K219 Gastro-esophageal reflux disease without esophagitis: Secondary | ICD-10-CM

## 2004-06-21 DIAGNOSIS — G43919 Migraine, unspecified, intractable, without status migrainosus: Secondary | ICD-10-CM

## 2004-06-21 DIAGNOSIS — Z0000 Encounter for general adult medical examination without abnormal findings: Secondary | ICD-10-CM

## 2004-06-21 MED ORDER — PROTONIX 40 MG PO TBEC
DELAYED_RELEASE_TABLET | ORAL | Status: DC
Start: 2004-06-21 — End: 2004-07-05

## 2004-06-21 MED ORDER — BUTALBITAL-APAP-CAFFEINE 50-325-40 MG PO TABS
ORAL_TABLET | ORAL | Status: DC
Start: 2004-06-21 — End: 2004-11-10

## 2004-06-21 NOTE — Progress Notes (Signed)
Due to language barrier, an interpreter was present during the history-taking and subsequent discussion (and for part of the physical exam) with this patient.    Deborah [name] is a 39 year old female who presents with headaches 4-5 days in duration. Headache "throbbing" in Frontal lobe with localization over L eye 10/10. Advil 2 tabs without much improvement. Denies prev Hx of significant migranes. Denies visual Sx including photophobia, Flashing lights. Unable to move around or do necessary ADLs d/t migrane.     Also c/o heartburn Sx not taking any meds for it. Not taking Protonix as listed in the computer. Heartburn has worsened since beginning Indomethacin for back pain and taking Ibuprofen for back pain as well. Denies emesis of any kind or dark or tarry stools. Denies TOB, ETOH, irriative spicy foods.     Social History   Marital Status: Single Spouse Name:    Years of Education: Number of children:   Occupational History   None on file    Social History Main Topics   Tobacco Use: Quit Packs/Day: .5 Years: 61    Quit date: 06/28/2002   Alcohol Use: No    Drug Use: Not on file    Sexual Activity: Yes Partners with: Female   Birth Control/Protection: Pill    Other Topics Concern   None on file    Social History Narrative   Works as a Financial trader. Married.       Does wear glasses. Last eye exam > 1 year. Does not wear them that often. Family Hx of glaucoma.     Patient Active Problem List:   CONTRACEPTIVE MANGMT NEC [V25.09]    Review of Patient's Allergies indicates:   Fluconazole   Current outpatient prescriptions:  PROTONIX 40 MG OR TBEC, 1 q hs, Disp: 30, Rfl: 0  MOTRIN 800 MG OR TABS, 1 t.i.d. p.r.n., Disp: 90, Rfl: 0  ALESSE (21) 0.1-20 MG-MCG OR TABS, 1 TABLET DAILY FOR 21 DAYS, Disp: 3, Rfl: 0  INDOMETHACIN 50 MG OR CAPS, 1 CAPSULE TWICE DAILY, Disp: 60, Rfl: 1  ALESSE (28) 0.1-20 MG-MCG OR TABS, 1 TABLET DAILY, Disp: 7 packs, Rfl: 0    OBJECTIVE:  BP 102/70   Pulse 72 RR 18  Gen- Alert, pleasant,  woman in NAD.   HEENT- TMs pearly, nares patent, Perrla, non-enlarged thyroid.  CV- RRR, no murmur, clicks, gallops, rubs. No JVD/LLE. 2+ radial, fem, distal pulses bil.  R- vesicular BS bil. No cough adventitious BS.   Neuro- CN2-12 intact, nonfocal  Psych- normal insight, and affect. Denies depressive Sx.     ASSESSMENT:  Lumbago.   GERD.  Migrane intractable.     PLAN:  346.91 MIGRAINE NOS INTRACTABLE (primary encounter diagnosis)  Note: Plan to try rescue therapy. Unsure of etiology of migranes. Per Up to Date, indomethacin is often used to treat migranes as abortive therapy, cause could also be organic. Screen vision to rule out and try meds, re-eval next week. Cautioned warning SSx to seek medical attn.   Plan: BUTALBITAL-APAP-CAFFEINE 50-325-40 MG OR TABS,    REFERRAL TO OPHTHALMOLOGY ([facility])       530.81 ESOPHAGEAL REFLUX  Note: Hx of GERD< but not taking med for it. Also now on Indomethacin daily with exacerbation of Sx. Plan to maintain on PPI to prevent erosive esoph or further complications.   Plan: PROTONIX 40 MG OR TBEC     V70.0 ROUTINE MEDICAL EXAM  Note: No recent record of these tests screen for baseline.  Plan: LIPID PANEL, ROUTINE VENIPUNCTURE,    COMPREHENSIVE METABOLIC PANEL, THYROID STIM    HORMONE, COMPLETE CBC W/AUTO DIFF WBC       RTC next week for eval of lumbago, migranes, GERD.   Will get screening labs prior to that visit.

## 2004-06-24 ENCOUNTER — Ambulatory Visit: Payer: Self-pay | Admitting: Ophthalmology

## 2004-06-24 ENCOUNTER — Other Ambulatory Visit (HOSPITAL_BASED_OUTPATIENT_CLINIC_OR_DEPARTMENT_OTHER): Payer: Charity

## 2004-06-24 DIAGNOSIS — Z0000 Encounter for general adult medical examination without abnormal findings: Secondary | ICD-10-CM

## 2004-06-24 LAB — BLOOD COUNT COMPLETE AUTO&AUTO DIFRNTL WBC
BASOPHIL %: 0.4 % (ref 0–2)
EOSINOPHIL %: 1.1 % (ref 0–7)
HEMATOCRIT: 37.4 % (ref 37.0–47.0)
HEMOGLOBIN: 12.6 g/dL (ref 12.0–16.0)
LYMPHOCYTE %: 35.5 % (ref 12–39)
MEAN CORP HGB CONC: 33.8 g/dL (ref 32.0–36.0)
MEAN CORPUSCULAR HGB: 30.3 pg (ref 27.0–31.0)
MEAN CORPUSCULAR VOL: 89.8 fL (ref 81.0–99.0)
MEAN PLATELET VOLUME: 9.2 fL (ref 6.4–10.8)
MONOCYTE %: 6.1 % (ref 1–12)
NEUTROPHIL %: 56.9 % (ref 46–79)
PLATELET COUNT: 263 10*3/uL (ref 150–400)
RBC DISTRIBUTION WIDTH: 11.6 % (ref 11.5–14.3)
RED BLOOD CELL COUNT: 4.17 MIL/uL — ABNORMAL LOW (ref 4.20–5.40)
WHITE BLOOD CELL COUNT: 8.4 10*3/uL (ref 4.8–10.8)

## 2004-06-24 LAB — COMPREHENSIVE METABOLIC PANEL
ALANINE AMINOTRANSFERASE: 15 IU/L (ref 7–35)
ALBUMIN: 4 g/dl (ref 3.4–4.8)
ALKALINE PHOSPHATASE: 68 IU/L (ref 25–106)
ASPARTATE AMINOTRANSFERASE: 19 IU/L (ref 8–34)
BILIRUBIN TOTAL: 0.3 mg/dl (ref 0.2–1.1)
BUN (UREA NITROGEN): 14 mg/dl (ref 6–20)
CALCIUM: 9.7 mg/dl (ref 8.6–10.0)
CARBON DIOXIDE: 27 mmol/L (ref 22–32)
CHLORIDE: 105 mmol/L (ref 101–111)
CREATININE: 0.7 mg/dl (ref 0.4–1.2)
Glucose Random: 82 mg/dl (ref 74–160)
POTASSIUM: 4.5 mmol/L (ref 3.5–5.1)
SODIUM: 139 mmol/L (ref 135–144)
TOTAL PROTEIN: 7.2 g/dl (ref 5.9–7.5)

## 2004-06-24 LAB — CHG LIPID PANEL
Cholesterol: 188 mg/dl (ref 0–200)
HIGH DENSITY LIPOPROTEIN: 53 mg/dl (ref 35–85)
LOW DENSITY LIPOPROTEIN DIRECT: 131 mg/dl — ABNORMAL HIGH (ref 0–100)
RISK FACTOR: 3.6 (ref ?–4.4)
TRIGLYCERIDES: 99 mg/dl (ref 0–150)

## 2004-06-24 LAB — TSH (THYROID STIMULATING HORMONE): TSH (THYROID STIM HORMONE): 0.84 u[IU]/mL (ref 0.34–5.60)

## 2004-06-24 NOTE — Nursing Note (Signed)
>>   [name], Deborah     06/24/2004   10:12 am  LABS DRAW CM.

## 2004-06-27 ENCOUNTER — Encounter (HOSPITAL_BASED_OUTPATIENT_CLINIC_OR_DEPARTMENT_OTHER): Payer: Self-pay

## 2004-07-04 ENCOUNTER — Other Ambulatory Visit (HOSPITAL_BASED_OUTPATIENT_CLINIC_OR_DEPARTMENT_OTHER): Payer: Charity

## 2004-07-05 ENCOUNTER — Ambulatory Visit (HOSPITAL_BASED_OUTPATIENT_CLINIC_OR_DEPARTMENT_OTHER): Payer: Charity

## 2004-07-05 ENCOUNTER — Encounter (HOSPITAL_BASED_OUTPATIENT_CLINIC_OR_DEPARTMENT_OTHER): Payer: Self-pay

## 2004-07-05 ENCOUNTER — Emergency Department (HOSPITAL_BASED_OUTPATIENT_CLINIC_OR_DEPARTMENT_OTHER): Payer: Self-pay | Admitting: Emergency Medicine

## 2004-07-05 VITALS — BP 110/70 | HR 76

## 2004-07-05 DIAGNOSIS — K219 Gastro-esophageal reflux disease without esophagitis: Secondary | ICD-10-CM

## 2004-07-05 MED ORDER — PROTONIX 40 MG PO TBEC
DELAYED_RELEASE_TABLET | ORAL | Status: DC
Start: 2004-07-05 — End: 2006-04-04

## 2004-07-05 NOTE — Progress Notes (Signed)
Due to language barrier, an interpreter was present during the history-taking and subsequent discussion (and for part of the physical exam) with this patient.    Deborah [name] is a 39 year old female who presents s/p ED visit yesterday for 9/10 stomach pain. Compliant taking Protonix qhs, compliant taking Indomethacin qd, not bid as prescribed, but the back pain has resolved with qd dosing of med. Nausea since taking hydrocodone s/p ED last night. Denies bloody stools, emesis, guiac neg at ED yest, per pt.       Patient Active Problem List:   CONTRACEPTIVE MANGMT NEC [V25.09]    Review of Patient's Allergies indicates:   Fluconazole   Current outpatient prescriptions:  PROTONIX 40 MG OR TBEC, 1 q hs, Disp: 30, Rfl: 12  BUTALBITAL-APAP-CAFFEINE 50-325-40 MG OR TABS, 1 OR 2 TABLETS EVERY 4 HOURS AS NEEDED, Disp: 28, Rfl: 0  INDOMETHACIN 50 MG OR CAPS, 1 CAPSULE TWICE DAILY, Disp: 60, Rfl: 1  ALESSE (28) 0.1-20 MG-MCG OR TABS, 1 TABLET DAILY, Disp: 7 packs, Rfl: 0  MOTRIN 800 MG OR TABS, 1 t.i.d. p.r.n., Disp: 90, Rfl: 0  ALESSE (21) 0.1-20 MG-MCG OR TABS, 1 TABLET DAILY FOR 21 DAYS, Disp: 3, Rfl: 0    OBJECTIVE:  BP 110/70   Pulse 76   LMP 06/14/2004 RR 18  Gen- Alert, pleasant, ill appearing woman in NAD.   CV- RRR, no murmur, clicks, gallops, rubs. No JVD/LLE. 2+ radial, fem, distal pulses bil.  R- vesicular BS bil. No cough adventitious BS.   ABD- + BS protuberant, no tenderness to light deep palp, no HSM, no hernias. No CVA tenderness.   Muscle- normal tone, full ROM, no C/C/E  Skin- pale, no rash, lesions, ulcers  Neuro- CN2-12 intact, nonfocal  Psych- normal insight, and affect.       ASSESSMENT:  GERD.  H Pylori.  Lumbago  Hyperlipidemia.     PLAN:  530.81 ESOPHAGEAL REFLUX (primary encounter diagnosis)  Note: Sx consistent with GERD, presence of heartburn without emesis, mild nausea since taking Hydrocodone from ED last evening. Screen for H Py and admin PPI BID, abstain from irritating foods, TOB,  Caffeine. Advised of SSx for emergent care.   Plan: PROTONIX 40 MG OR TBEC, HELICOBACTER PYLORI,    IGG, ROUTINE VENIPUNCTURE     Hyperlipidemia  3/06 labs reveal Cholesterol 188 TRIGLYCERIDES 99 HDL 53 LDL 131  Educated and provided info about low chol diet and exercise.     Pt verbalizes agreement with this Plan of Care.   RTC next week for lab review.

## 2004-07-07 LAB — HELICOBACTER PYLORI IGG: HELICOBACTER PYLORI IGG: NEGATIVE

## 2004-07-08 ENCOUNTER — Other Ambulatory Visit (HOSPITAL_BASED_OUTPATIENT_CLINIC_OR_DEPARTMENT_OTHER): Payer: Charity

## 2004-07-11 NOTE — Progress Notes (Addendum)
Quick Note:    Rev'd result.   Will notify pt at f up appt.     ______

## 2004-07-14 ENCOUNTER — Other Ambulatory Visit (HOSPITAL_BASED_OUTPATIENT_CLINIC_OR_DEPARTMENT_OTHER): Payer: Charity

## 2004-07-22 ENCOUNTER — Ambulatory Visit (HOSPITAL_BASED_OUTPATIENT_CLINIC_OR_DEPARTMENT_OTHER): Payer: Charity

## 2004-07-22 VITALS — BP 100/72 | HR 87 | Wt 158.0 lb

## 2004-07-22 DIAGNOSIS — M549 Dorsalgia, unspecified: Secondary | ICD-10-CM

## 2004-07-22 MED ORDER — FLEXERIL 10 MG PO TABS
ORAL_TABLET | ORAL | Status: DC
Start: 2004-07-22 — End: 2005-06-21

## 2004-07-22 NOTE — Progress Notes (Signed)
Due to language barrier, an interpreter was present during the history-taking and subsequent discussion (and for part of the physical exam) with this patient.      SUBJECTIVE:   Deborah [name] is a 39 year old female who complains of low back pain starting 6 weeks ago, positional with bending or lifting, without radiation down the legs. It's not as acute at her visit last month, but still having significant pain. Started getting stomach pain after starting the indocin. Went to ED, started on PPI, continues to have intermittent pain. Precipitating factors: none recalled by the patient. Prior history of back problems: recurrent self limited episodes of low back pain in the past. There is no numbness in the legs. Pain meds- none now. Pain with abrupt movement, 8.5/10. 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      OBJECTIVE:   BP 100/72   Pulse 87   Wt 158 lbs (71.7kg)   LMP 06/17/2004   Patient appears to be in mild to moderate pain, antalgic gait noted. Lumbosacral spine area reveals no local tenderness or mass. Painful and reduced LS ROM noted. Straight leg raise is negative at 45 degrees on bilateral. DTR's, motor strength and sensation normal, including heel and toe gait. Peripheral pulses are palpable. X-Ray: Normal     ASSESSMENT:   lumbar strain      PLAN:   For acute pain, rest, intermittent application of heat (do not sleep on heating pad), analgesics and muscle relaxants are recommended. Discussed longer term treatment plan of prn NSAID's and discussed a home back care exercise program with flexion exercise routine. Proper lifting with avoidance of heavy lifting discussed. Physical Therapy. RTC in 2 weeks for re-eval.

## 2004-08-05 ENCOUNTER — Other Ambulatory Visit (HOSPITAL_BASED_OUTPATIENT_CLINIC_OR_DEPARTMENT_OTHER): Payer: Charity

## 2004-08-10 ENCOUNTER — Ambulatory Visit (HOSPITAL_BASED_OUTPATIENT_CLINIC_OR_DEPARTMENT_OTHER): Payer: Charity

## 2004-08-10 DIAGNOSIS — M545 Low back pain, unspecified: Secondary | ICD-10-CM

## 2004-08-16 ENCOUNTER — Ambulatory Visit (HOSPITAL_BASED_OUTPATIENT_CLINIC_OR_DEPARTMENT_OTHER): Payer: Charity

## 2004-08-16 DIAGNOSIS — M545 Low back pain, unspecified: Secondary | ICD-10-CM

## 2004-08-22 ENCOUNTER — Ambulatory Visit (HOSPITAL_BASED_OUTPATIENT_CLINIC_OR_DEPARTMENT_OTHER): Payer: Charity | Admitting: Rehabilitative and Restorative Service Providers"

## 2004-08-23 ENCOUNTER — Other Ambulatory Visit (HOSPITAL_BASED_OUTPATIENT_CLINIC_OR_DEPARTMENT_OTHER): Payer: Charity

## 2004-08-24 ENCOUNTER — Ambulatory Visit (HOSPITAL_BASED_OUTPATIENT_CLINIC_OR_DEPARTMENT_OTHER): Payer: Charity

## 2004-08-24 DIAGNOSIS — M545 Low back pain, unspecified: Secondary | ICD-10-CM

## 2004-09-02 ENCOUNTER — Ambulatory Visit (HOSPITAL_BASED_OUTPATIENT_CLINIC_OR_DEPARTMENT_OTHER): Payer: Charity

## 2004-09-06 ENCOUNTER — Ambulatory Visit (HOSPITAL_BASED_OUTPATIENT_CLINIC_OR_DEPARTMENT_OTHER): Payer: Charity

## 2004-09-09 ENCOUNTER — Ambulatory Visit (HOSPITAL_BASED_OUTPATIENT_CLINIC_OR_DEPARTMENT_OTHER): Payer: Charity

## 2004-09-09 VITALS — BP 100/80 | HR 72

## 2004-09-09 DIAGNOSIS — M549 Dorsalgia, unspecified: Secondary | ICD-10-CM

## 2004-09-09 NOTE — Progress Notes (Signed)
Due to language barrier, an interpreter was present during the history-taking and subsequent discussion (and for part of the physical exam) with this patient.      SUBJECTIVE:   Deborah [name] is a 39 year old female who is here for f/u for back pain. Improved with NSAIDs and PT, but now for the last 2 weeks has had worsening pain. Had good effect when taking flexeril over the first couple of weeks. Had some stomach pain with NSAIDs.     OBJECTIVE:   BP 100/72   Pulse 87   Wt 158 lbs (71.7kg)   LMP 06/17/2004   Patient appears to be in mild to moderate pain, antalgic gait noted. Lumbosacral spine area reveals no local tenderness or mass. Painful and reduced LS ROM noted. Straight leg raise is negative at 45 degrees on bilateral. DTR's, motor strength and sensation normal, including heel and toe gait. Peripheral pulses are palpable. X-Ray: Normal      ASSESSMENT:   lumbar strain      PLAN:   Continue with PT. Restart flexeril 10mg  BID and NSAID the same. May also take PPI with NSAID to prevent stomach sx, but if they start regardless should d/c NSAID. RTC in 3 weeks for f/u.

## 2004-09-16 ENCOUNTER — Ambulatory Visit (HOSPITAL_BASED_OUTPATIENT_CLINIC_OR_DEPARTMENT_OTHER): Payer: Charity | Admitting: Rehabilitative and Restorative Service Providers"

## 2004-09-21 ENCOUNTER — Ambulatory Visit (HOSPITAL_BASED_OUTPATIENT_CLINIC_OR_DEPARTMENT_OTHER): Payer: Charity

## 2004-09-21 DIAGNOSIS — M545 Low back pain, unspecified: Secondary | ICD-10-CM

## 2004-09-29 ENCOUNTER — Ambulatory Visit (HOSPITAL_BASED_OUTPATIENT_CLINIC_OR_DEPARTMENT_OTHER): Payer: Charity

## 2004-09-29 DIAGNOSIS — M545 Low back pain, unspecified: Secondary | ICD-10-CM

## 2004-10-03 ENCOUNTER — Ambulatory Visit (HOSPITAL_BASED_OUTPATIENT_CLINIC_OR_DEPARTMENT_OTHER): Payer: Charity

## 2004-10-03 DIAGNOSIS — M545 Low back pain, unspecified: Secondary | ICD-10-CM

## 2004-10-06 ENCOUNTER — Ambulatory Visit (HOSPITAL_BASED_OUTPATIENT_CLINIC_OR_DEPARTMENT_OTHER): Payer: Charity

## 2004-10-07 ENCOUNTER — Ambulatory Visit (HOSPITAL_BASED_OUTPATIENT_CLINIC_OR_DEPARTMENT_OTHER): Payer: Charity

## 2004-10-07 VITALS — BP 110/70 | HR 72 | Resp 18 | Wt 158.0 lb

## 2004-10-07 DIAGNOSIS — M549 Dorsalgia, unspecified: Secondary | ICD-10-CM

## 2004-10-07 NOTE — Progress Notes (Signed)
Due to language barrier, an interpreter was present during the history-taking and subsequent discussion (and for part of the physical exam) with this patient.      SUBJECTIVE:   Deborah [name] is a 39 year old female who is here for f/u for back pain. Improved with NSAIDs and PT, but now for the last 5 weeks has had worsening pain. Unable to lay down flat. Legs feel very 'tired', no numbness or weakness. Does not radiate to legs.     OBJECTIVE:   BP 110/70   Pulse 72   Resp 18   Wt 158 lbs (71.7kg)   LMP 10/07/2004   Patient appears to be in mild to moderate pain, antalgic gait noted. Lumbosacral spine area reveals no local tenderness or mass. Painful and reduced LS ROM noted. Straight leg raise is negative at 45 degrees on bilateral. DTR's, motor strength and sensation normal. X-Ray: degenerative changes of facet joints in L4 and L5     ASSESSMENT:   724.5 BACKACHE NOS (primary encounter diagnosis)  Note: worsening pain without radiculopathy, possibly due to degenerative changes seen on x-ray  Plan: REFERRAL TO ORTHOPEDICS ([facility])   For eval and treatment.

## 2004-10-17 ENCOUNTER — Ambulatory Visit (HOSPITAL_BASED_OUTPATIENT_CLINIC_OR_DEPARTMENT_OTHER): Payer: Charity

## 2004-10-19 ENCOUNTER — Ambulatory Visit (HOSPITAL_BASED_OUTPATIENT_CLINIC_OR_DEPARTMENT_OTHER): Payer: Charity

## 2004-10-20 ENCOUNTER — Encounter (HOSPITAL_BASED_OUTPATIENT_CLINIC_OR_DEPARTMENT_OTHER): Payer: Charity | Admitting: Orthopaedic Surgery

## 2004-10-20 DIAGNOSIS — Q762 Congenital spondylolisthesis: Secondary | ICD-10-CM

## 2004-10-24 ENCOUNTER — Ambulatory Visit (HOSPITAL_BASED_OUTPATIENT_CLINIC_OR_DEPARTMENT_OTHER): Payer: Charity

## 2004-10-24 LAB — ORTHOPEDIC OFFICE NOTE

## 2004-10-25 ENCOUNTER — Other Ambulatory Visit (HOSPITAL_BASED_OUTPATIENT_CLINIC_OR_DEPARTMENT_OTHER): Payer: Self-pay | Admitting: Orthopaedic Surgery

## 2004-10-25 DIAGNOSIS — M519 Unspecified thoracic, thoracolumbar and lumbosacral intervertebral disc disorder: Secondary | ICD-10-CM

## 2004-10-25 DIAGNOSIS — M5137 Other intervertebral disc degeneration, lumbosacral region: Secondary | ICD-10-CM

## 2004-10-25 DIAGNOSIS — M545 Low back pain, unspecified: Secondary | ICD-10-CM

## 2004-10-25 DIAGNOSIS — M51379 Other intervertebral disc degeneration, lumbosacral region without mention of lumbar back pain or lower extremity pain: Secondary | ICD-10-CM

## 2004-10-25 LAB — CT LUMBAR SPINE WO CONTRAST

## 2004-10-26 ENCOUNTER — Ambulatory Visit (HOSPITAL_BASED_OUTPATIENT_CLINIC_OR_DEPARTMENT_OTHER): Payer: Charity

## 2004-10-27 ENCOUNTER — Ambulatory Visit (HOSPITAL_BASED_OUTPATIENT_CLINIC_OR_DEPARTMENT_OTHER): Payer: Charity | Admitting: Orthopaedic Surgery

## 2004-10-27 DIAGNOSIS — M545 Low back pain, unspecified: Secondary | ICD-10-CM

## 2004-10-29 LAB — ORTHOPEDIC OFFICE NOTE

## 2004-10-31 ENCOUNTER — Encounter (HOSPITAL_BASED_OUTPATIENT_CLINIC_OR_DEPARTMENT_OTHER): Payer: Charity

## 2004-11-02 ENCOUNTER — Ambulatory Visit (HOSPITAL_BASED_OUTPATIENT_CLINIC_OR_DEPARTMENT_OTHER): Payer: Charity

## 2004-11-02 VITALS — BP 108/76

## 2004-11-02 DIAGNOSIS — Z3009 Encounter for other general counseling and advice on contraception: Secondary | ICD-10-CM

## 2004-11-02 MED ORDER — ALESSE (21) 0.1-20 MG-MCG OR TABS
ORAL_TABLET | ORAL | Status: DC
Start: 2004-11-02 — End: 2004-11-10

## 2004-11-02 NOTE — Progress Notes (Signed)
Agree with plan; dispense one pack of Alesse  J. Chidera Thivierge,CNM

## 2004-11-02 NOTE — Progress Notes (Signed)
ORAL CONTRACEPTIVE REVISIT  Counseling Notes     Since last annual visit, any significant changes in medical history (hospitalizations or surgery (past or planned), serious illnesses, new medications, smoking habits, social or sexual history, etc.)? No    Any problems/side effects? No.    Warning signs ("A C H E S") reviewed: yes    Taking pills correctly and consistently? Yes  Satisfied with method/wants to continue? Yes  Any questions or concerns about the method? No  Method specific counseling done at previous visit (s)? yes  STD/HIV counseling and risk assessment done/updated: yes  Consent forms signed and up-to-date: yes  Emergency contraception education/counseling done: yes  EC consent signed? N/A    Comments: Deborah [name] is a 39 year old female requesting OCP refill. Taking Alesse x 4 yrs, denies any problems or side effects. Happy with method, wants to continue.  Married x 5 yrs, monogamous  G1 P1, has a 51 yo son  Non smoker  Last Pap 11/04/03 - neg, has appt for Pe/Pap on 11/10/04.    1 pack of Alesse given, F/UP as scheduled for Pe/Pap.

## 2004-11-09 ENCOUNTER — Encounter (HOSPITAL_BASED_OUTPATIENT_CLINIC_OR_DEPARTMENT_OTHER): Payer: Charity | Admitting: Physical Medicine & Rehabilitation

## 2004-11-09 DIAGNOSIS — M545 Low back pain, unspecified: Secondary | ICD-10-CM

## 2004-11-09 DIAGNOSIS — M5380 Other specified dorsopathies, site unspecified: Secondary | ICD-10-CM

## 2004-11-10 ENCOUNTER — Encounter (HOSPITAL_BASED_OUTPATIENT_CLINIC_OR_DEPARTMENT_OTHER): Payer: Self-pay

## 2004-11-10 ENCOUNTER — Ambulatory Visit (HOSPITAL_BASED_OUTPATIENT_CLINIC_OR_DEPARTMENT_OTHER): Payer: Charity

## 2004-11-10 VITALS — BP 100/60 | HR 76

## 2004-11-10 DIAGNOSIS — N63 Unspecified lump in unspecified breast: Secondary | ICD-10-CM

## 2004-11-10 DIAGNOSIS — Z0000 Encounter for general adult medical examination without abnormal findings: Secondary | ICD-10-CM

## 2004-11-10 DIAGNOSIS — Z01419 Encounter for gynecological examination (general) (routine) without abnormal findings: Secondary | ICD-10-CM

## 2004-11-10 DIAGNOSIS — Z3009 Encounter for other general counseling and advice on contraception: Secondary | ICD-10-CM

## 2004-11-10 HISTORY — DX: Unspecified lump in unspecified breast: N63.0

## 2004-11-10 LAB — BLOOD COUNT COMPLETE AUTO&AUTO DIFRNTL WBC
BASOPHIL %: 0.4 % (ref 0–2)
EOSINOPHIL %: 0.6 % (ref 0–7)
HEMATOCRIT: 36.7 % — ABNORMAL LOW (ref 37.0–47.0)
HEMOGLOBIN: 12.3 g/dL (ref 12.0–16.0)
LYMPHOCYTE %: 31.7 % (ref 12–39)
MEAN CORP HGB CONC: 33.6 g/dL (ref 32.0–36.0)
MEAN CORPUSCULAR HGB: 30.3 pg (ref 27.0–31.0)
MEAN CORPUSCULAR VOL: 90.1 fL (ref 81.0–99.0)
MEAN PLATELET VOLUME: 9.2 fL (ref 6.4–10.8)
MONOCYTE %: 6 % (ref 1–12)
NEUTROPHIL %: 61.3 % (ref 46–79)
PLATELET COUNT: 265 10*3/uL (ref 150–400)
RBC DISTRIBUTION WIDTH: 12.1 % (ref 11.5–14.3)
RED BLOOD CELL COUNT: 4.07 MIL/uL — ABNORMAL LOW (ref 4.20–5.40)
WHITE BLOOD CELL COUNT: 10.3 10*3/uL (ref 4.8–10.8)

## 2004-11-10 LAB — TSH (THYROID STIMULATING HORMONE): TSH (THYROID STIM HORMONE): 1.05 u[IU]/mL (ref 0.34–5.60)

## 2004-11-10 LAB — CYTOPATH, C/V, THIN LAYER

## 2004-11-10 LAB — ORTHOPEDIC OFFICE NOTE

## 2004-11-10 MED ORDER — PAROXETINE HCL 20 MG PO TABS
ORAL_TABLET | ORAL | Status: DC
Start: 2004-11-10 — End: 2005-06-21

## 2004-11-10 MED ORDER — ALESSE (28) 0.1-20 MG-MCG PO TABS
ORAL_TABLET | ORAL | Status: DC
Start: 2004-11-10 — End: 2005-04-12

## 2004-11-10 NOTE — Progress Notes (Signed)
Due to language barrier, an interpreter was present during the history-taking and subsequent discussion (and for part of the physical exam) with this patient.    Deborah [name] is a 39 year old female who presents for PE PAP.     Patient Active Problem List:   CONTRACEPTIVE MANGMT NEC [V25.09]    Review of Patient's Allergies indicates:  Ibuprofen   Current outpatient prescriptions:  ALESSE (21) 0.1-20 MG-MCG OR TABS, 1 TABLET DAILY FOR 21 DAYS, Disp: 1 pack, Rfl: 0  FLEXERIL 10 MG OR TABS, 1/2 to 1 TABLET 3 TIMES DAILY, Disp: 90, Rfl: 0  PROTONIX 40 MG OR TBEC, 1 tab bid, 1 por manana e 1 por noche, Disp: 60, Rfl: 12    Review Of Systems  Gen: doing "okay today"  Skin: neg rash, lesions, open areas  Eyes: Glasses yes Last eye exam 4 mos  Ears/Nose/Throat: neg ear pain, neg throat pain  Respiratory: neg SOB, cough, wheezing  Cardiovascular: h/a improved without medications. denies fatigue neg CP, palps, dizziness, syncope.   Breast- 1 small lump noted in each breast for the past few months, at 6 o'clock about 1" below nipple, tender with bathing and touch, s/p fibroadenoma in the L breast in the past.   Gastrointestinal: Protonix daily for heartburn, takes as needed. neg cramping, constipation, diarrhea, blood in stools  Genitourinary: G1 P1 No Abn PAP or STIs, neg dysuria, hematuria, polyuria, menarche 10 First live birth 16y. Breastfed 10 mos, OCPs ~20 y.   Musculoskeletal: Saw a spine specialist yesterday and was given a medication, unsure of the name, pain is ongoing for the spine. CT revealed Minimal degenerative changes of the L4-L5 and L5-S1 facet joints. Mild disc bulging L4-L5 and L5-S1.   Denies radiculopathy or BB Sx today.   Neurologic: neg numbness tingling fingers/toes, memory problems,   Psychiatric: + supports include husband. Notes intense irritability with menses, prior to menses. Reports that it is diff to control mood" Denies SI HI teariness helpless hopeless feelings. neg depressive Sx  including diet, exercise sleep changes.   Hematologic/Lymphatic/Immunologic: neg bleeding problems, no lymph nodes noted  Endocrine: neg excess fatigue or excitability, neg wt changes,     Social History   Marital Status: Single Spouse Name:    Years of Education: Number of children:     Occupational History   None on file    Social History Main Topics   Tobacco Use: Quit Packs/Day: .5 Years: 52    Quit date: 06/28/2002   Alcohol Use: No    Drug Use: No    Sexual Activity: Yes Partners with: Female   Birth Control/Protection: Pill    Other Topics Concern   None on file    Social History Narrative   Works as a Financial trader. Married.   Review of patient's family history indicates:   Hypertension Mother    Comment: alive.    Hypertension Father     OBJECTIVE:  BP 100/60   Pulse 76   LMP 11/01/04 RR 20   Gen- Alert, pleasant, woman in NAD.   HEENT- TMs pearly, nares patent, Perrla, non-enlarged thyroid.  CV- RRR, no murmur, clicks, gallops, rubs. No JVD/LLE. 2+ radial, fem, distal pulses bil.  R- vesicular BS bil. No cough adventitious BS.   Breasts- Reviewed SBE. Diffusely dense cystic breasts with 0.2cm mobile tender lumps palpated at approx 6 o'clock inferior to the nipple 1" bil. no masses b/l, no ax nodes, nipples nl, everted b/l.  GU: EGBUS  gross nl, no lesions,   VAG: tone nl, scant white d/c  CX: pink, closed no CMT   UT: sm, midplane, n/t  AD: n/t, no masses b/l  ABD- + BS, no HSM, no hernias. No CVA Tenderness.   Lymph- no supra, axillary, inguinal nodes.  Muscle- normal tone, full ROM, no C/C/E  Skin- no rash, lesions, ulcers  Neuro- CN2-12 intact, nonfocal  Psych- normal insight, and affect. Coping well     ASSESSMENT:  39 yo woman with contraception needs, h/o fibroadenoma L, with new lumps palpated by pt on exam.     PLAN:  V70.0 ROUTINE MEDICAL EXAM (primary encounter diagnosis)  Note: screen for baseline evalulation.   Plan: COMPLETE CBC W/AUTO DIFF WBC, THYROID STIM    HORMONE     V25.09 CONTRACEPTIVE  MANGMT NEC  Note: Stable on OCP x 1 week, refill 5 mos. Encouraged barrier method until 1 mos completed.   Plan: ALESSE (28) 0.1-20 MG-MCG OR TABS     V72.31 ROUTINE GYN EXAMINATION  Note: screen for baseline evalulation. For Premenstrual syndrome, provided SSRIs for the week prior to and during menses.   Plan: AMPLIFIED GENPROBE CHLAM/GC   PAP was obtained and sent.     611.72 LUMP OR MASS IN BREAST  Note: h/o fibroadenoma L, dense cystic breasts on exam with new lumps palpated by pt. Tried to sched appt, but [facility] closed at 1645 11/10/04.  Plan: REFERRAL TO [facility] - [facility] ([facility])    Mammogram referral.      Pt verbalizes agreement with this Plan of Care.   RTC after appt with breast center.

## 2004-11-11 LAB — CHLAMYDIA GC NAAT
GENPROBE CHLAMYDIA: NEGATIVE
GENPROBE GC: NEGATIVE

## 2004-11-15 NOTE — Progress Notes (Addendum)
Quick Note:    Reviewed Result. No new orders.     ______

## 2004-11-16 ENCOUNTER — Encounter (HOSPITAL_BASED_OUTPATIENT_CLINIC_OR_DEPARTMENT_OTHER): Payer: Self-pay

## 2004-11-16 ENCOUNTER — Ambulatory Visit (HOSPITAL_BASED_OUTPATIENT_CLINIC_OR_DEPARTMENT_OTHER): Payer: Self-pay | Admitting: Surgery

## 2004-11-16 DIAGNOSIS — Z1231 Encounter for screening mammogram for malignant neoplasm of breast: Secondary | ICD-10-CM

## 2004-11-16 DIAGNOSIS — N63 Unspecified lump in unspecified breast: Secondary | ICD-10-CM

## 2004-11-16 LAB — MA SCREENING MAMMO BILATERAL WITH CAD

## 2004-11-16 NOTE — Progress Notes (Signed)
Email abstraction from 11/16/04     Saw MCS in clinic today for bilateral breast masses. On physical exam, I did not appreciate any discrete masses although she has rather nodular breasts. She had a bilateral dx mammogram today that was benign (BIRADS 3) with a recommendation for a follow-up 6 month study. I reassured her and have scheduled this. We will see her the same day for results.    Curt Jews, M.D.    Breast Surgery Fellow  [facility]  [facility]  [address], [place] [address]    Phone: [phone]  Fax: [phone]  Pager: [phone]  Mobile: [phone]

## 2004-11-22 LAB — TCH BREAST CTR CLINIC NOTE

## 2004-12-14 ENCOUNTER — Encounter (HOSPITAL_BASED_OUTPATIENT_CLINIC_OR_DEPARTMENT_OTHER): Payer: Self-pay

## 2005-01-13 NOTE — Progress Notes (Signed)
Pt left without being seen.d

## 2005-01-20 ENCOUNTER — Ambulatory Visit (HOSPITAL_BASED_OUTPATIENT_CLINIC_OR_DEPARTMENT_OTHER): Payer: Self-pay

## 2005-01-30 ENCOUNTER — Ambulatory Visit (HOSPITAL_BASED_OUTPATIENT_CLINIC_OR_DEPARTMENT_OTHER): Payer: Self-pay | Admitting: Internal Medicine

## 2005-02-13 ENCOUNTER — Ambulatory Visit (HOSPITAL_BASED_OUTPATIENT_CLINIC_OR_DEPARTMENT_OTHER): Payer: Charity

## 2005-02-13 VITALS — Temp 97.7°F

## 2005-02-13 DIAGNOSIS — Z23 Encounter for immunization: Secondary | ICD-10-CM

## 2005-02-13 NOTE — Progress Notes (Signed)
Influenza Vaccine Procedure  February 13, 2005    1. Has the patient received the information for the influenza vaccine? Yes    2. Does the patient have any of the following contraindications?  Allergy to eggs? No  Allergic reaction to previous influenza vaccines? No  Any other problems to previous influenza vaccines? No  Paralyzed by Guillain-Barre syndrome? No  Currently pregnant? No  Current moderate or severe illness? No  Allergy to contact lens solution? No    3. The vaccine has been administered in the usual fashion and the patient/guardian was instructed to wait 20 minutes before leaving the building in the event of an allergic reaction:     Immunization information and VIS for flu vaccine(s) for 2006-2007 reviewed; verbal consent given by patient/guardian.

## 2005-04-12 ENCOUNTER — Ambulatory Visit (HOSPITAL_BASED_OUTPATIENT_CLINIC_OR_DEPARTMENT_OTHER): Payer: Charity

## 2005-04-12 ENCOUNTER — Other Ambulatory Visit (HOSPITAL_BASED_OUTPATIENT_CLINIC_OR_DEPARTMENT_OTHER): Payer: Self-pay

## 2005-04-12 ENCOUNTER — Encounter (HOSPITAL_BASED_OUTPATIENT_CLINIC_OR_DEPARTMENT_OTHER): Payer: Self-pay

## 2005-04-12 VITALS — BP 110/64

## 2005-04-12 DIAGNOSIS — Z3009 Encounter for other general counseling and advice on contraception: Secondary | ICD-10-CM

## 2005-04-12 MED ORDER — LEVLITE (28) 0.1-20 MG-MCG PO TABS
ORAL_TABLET | ORAL | Status: DC
Start: 2005-04-12 — End: 2006-04-04

## 2005-04-12 NOTE — Progress Notes (Signed)
ORAL CONTRACEPTIVE REVISIT  Counseling Notes     Since last annual visit, any significant changes in medical history (hospitalizations or surgery (past or planned), serious illnesses, new medications, smoking habits, social or sexual history, etc.)? No    Any problems/side effects? No.    Warning signs ("A C H E S") reviewed: yes    Taking pills correctly and consistently? Yes  Satisfied with method/wants to continue? Yes  Any questions or concerns about the method? No  Method specific counseling done at previous visit (s)? yes  STD/HIV counseling and risk assessment done/updated: yes  Consent forms signed and up-to-date: yes  Emergency contraception education/counseling done: yes  EC consent signed? N/A  Comments: Mattie Nordell is a 40 year old female requesting ocp refill. Has been taking Alesse for about 5 yrs, denies any problems or side effects. Pt informed that we don't carry samples of Alesse anymore - given the option to get a generic at [facility] or change to a different sample. Pt wants generic.  Married x 5.5 yrs, monogamous  G1 P1 has a 109.5 yo son.  Non smoker.  Last Pap 11/10/04 - neg.    Discussed with Orvan July, ok to switch to Levlite - request sent to [facility] (please see orders only encounter by Orvan July - 3 packs, 4 refills). Pt to RTC Aug/07 for Pe/Pap, sooner if needed.

## 2005-04-12 NOTE — Progress Notes (Signed)
Discussed with Family Planning specialist and agree with Plan.

## 2005-05-17 ENCOUNTER — Ambulatory Visit (HOSPITAL_BASED_OUTPATIENT_CLINIC_OR_DEPARTMENT_OTHER): Payer: Self-pay | Admitting: Surgery

## 2005-05-17 DIAGNOSIS — Z1231 Encounter for screening mammogram for malignant neoplasm of breast: Secondary | ICD-10-CM

## 2005-05-17 LAB — MA DIAGNOSTIC MAMMO BILATERAL WITH CAD

## 2005-05-20 ENCOUNTER — Emergency Department (HOSPITAL_BASED_OUTPATIENT_CLINIC_OR_DEPARTMENT_OTHER): Payer: Self-pay | Admitting: Emergency Medicine

## 2005-05-20 LAB — COMPREHENSIVE METABOLIC PANEL
ALANINE AMINOTRANSFERASE: 17 IU/L (ref 7–35)
ALBUMIN: 3.9 g/dl (ref 3.4–4.8)
ALKALINE PHOSPHATASE: 65 IU/L (ref 25–106)
ASPARTATE AMINOTRANSFERASE: 31 IU/L (ref 8–34)
BILIRUBIN TOTAL: 1.5 mg/dl — ABNORMAL HIGH (ref 0.2–1.1)
BUN (UREA NITROGEN): 13 mg/dl (ref 6–20)
CALCIUM: 8.8 mg/dl (ref 8.6–10.0)
CARBON DIOXIDE: 26 mmol/L (ref 22–32)
CHLORIDE: 105 mmol/L (ref 98–107)
CREATININE: 0.6 mg/dl (ref 0.4–1.2)
Glucose Random: 108 mg/dl (ref 74–160)
POTASSIUM: 4.9 mmol/L (ref 3.5–5.1)
SODIUM: 139 mmol/L (ref 135–144)
TOTAL PROTEIN: 6.8 g/dl (ref 5.9–7.5)

## 2005-05-20 LAB — BLOOD COUNT COMPLETE AUTO&AUTO DIFRNTL WBC
BASOPHIL %: 0.4 % (ref 0–2)
EOSINOPHIL %: 1 % (ref 0–7)
HEMATOCRIT: 40.4 % (ref 37.0–47.0)
HEMOGLOBIN: 13.6 g/dL (ref 12.0–16.0)
LYMPHOCYTE %: 16.8 % (ref 12–39)
MEAN CORP HGB CONC: 33.7 g/dL (ref 32.0–36.0)
MEAN CORPUSCULAR HGB: 30 pg (ref 27.0–31.0)
MEAN CORPUSCULAR VOL: 89.1 fL (ref 81.0–99.0)
MEAN PLATELET VOLUME: 8.2 fL (ref 6.4–10.8)
MONOCYTE %: 7.1 % (ref 1–12)
NEUTROPHIL %: 74.7 % (ref 46–79)
PLATELET COUNT: 230 10*3/uL (ref 150–400)
RBC DISTRIBUTION WIDTH: 11.1 % — ABNORMAL LOW (ref 11.5–14.3)
RED BLOOD CELL COUNT: 4.53 MIL/uL (ref 4.20–5.40)
WHITE BLOOD CELL COUNT: 7.9 10*3/uL (ref 4.8–10.8)

## 2005-05-20 LAB — LIPASE: LIPASE: 21 U/L (ref 10–50)

## 2005-05-20 LAB — AMYLASE: AMYLASE: 79 U/L (ref 15–133)

## 2005-05-30 ENCOUNTER — Encounter (HOSPITAL_BASED_OUTPATIENT_CLINIC_OR_DEPARTMENT_OTHER): Payer: Self-pay

## 2005-06-21 ENCOUNTER — Ambulatory Visit (HOSPITAL_BASED_OUTPATIENT_CLINIC_OR_DEPARTMENT_OTHER): Payer: Charity

## 2005-06-21 ENCOUNTER — Encounter (HOSPITAL_BASED_OUTPATIENT_CLINIC_OR_DEPARTMENT_OTHER): Payer: Self-pay

## 2005-06-21 VITALS — BP 112/68 | HR 76 | Temp 98.5°F | Wt 148.0 lb

## 2005-06-21 DIAGNOSIS — Z0000 Encounter for general adult medical examination without abnormal findings: Secondary | ICD-10-CM

## 2005-06-21 DIAGNOSIS — E785 Hyperlipidemia, unspecified: Secondary | ICD-10-CM | POA: Insufficient documentation

## 2005-06-21 DIAGNOSIS — K219 Gastro-esophageal reflux disease without esophagitis: Secondary | ICD-10-CM

## 2005-06-21 DIAGNOSIS — Z3009 Encounter for other general counseling and advice on contraception: Secondary | ICD-10-CM

## 2005-06-21 NOTE — Progress Notes (Signed)
Due to language barrier, an interpreter was present during the history-taking and subsequent discussion (and for part of the physical exam) with this patient.    Deborah [name] is a 40 year old female who presents for stomach issues.   1. GI- reports long Hx stomach issues, constant pain 12/10 with like someone is punching in the stomach, pain does not vary with or without food. Has made diet changed, less acidic foods, and has also recently went to the ED Was given meds from the ED, taking qod, last dose 2 days ago. Unsure of the medications- one was for pain and has remained on the PPI qd. Denies bowel changes wt loss. Stress in family includes the loss of job for spouse and large mortgage    2. Contraception- taking Levlite without SE.     3. Hyperlipidemia- stable no diet or exercise changes.   Denies CP palps h/a dizziness nausea vomiting diarr constip fever chills.   Review of patient's family history indicates:   Hypertension Mother    Comment: alive.    Hypertension Father     Social History   Marital Status: Single Spouse Name:    Years of Education: Number of children:     Occupational History   None on file    Social History Main Topics   Tobacco Use: Quit Packs/Day: .5 Years: 12    Quit date: 06/28/2002   Comment: stopped 2005 Had smoked for 27 years!   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      Past Surgical History:   CESAREAN DELIVERY ONLY    Comment: breech   BREAST BIOPSY SPECIMEN    Comment: s/p fibroadenoma on the L,     atient Active Problem List:   CONTRACEPTIVE MANGMT NEC [V25.09]   LUMP OR MASS IN BREAST [611.72]    Review of Patient's Allergies  indicates:   Ibuprofen Swelling  Current outpatient prescriptions:  LEVLITE (28) 0.1-20 MG-MCG OR TABS, 1 TABLET DAILY, Disp: 3 packs, Rfl: 4  PROTONIX 40 MG OR TBEC, 1 tab bid, 1 por manana e 1 por noche, Disp: 60, Rfl: 12    OBJECTIVE:  BP 112/68   Pulse 76   Temp (Src) 98.5 (Temporal)   Wt 148 lbs (67.1kg)   LMP 06/09/2005 RR 18  Most Recent Weight Reading(s)   Date: Wt:   06/21/2005 148 lbs (67.132 kg)   11/10/2004 129 lbs 8.0 oz (58.741 kg)   10/07/2004 158 lbs (71.668 kg)   07/22/2004 158 lbs (71.668 kg)   06/13/2004 161 lbs (73.029 kg)  Gen- Alert, pleasant middle aged woman in NAD.   CV- RRR, no murmur, clicks, gallops, rubs.  R- vesicular BS bil. No cough adventitious BS.   ABD- fatty abd + BS, no HSM, no hernias. Neg murphys no rebound tenderness. No CVA Tenderness.   Lymph- no supra, axillary, inguinal nodes.  Muscle- normal tone, full ROM, no C/C/E  Skin- no rash, lesions, ulcers  Neuro- CN2-12 intact, nonfocal  Psych-  normal insight, and pleasant affect. Coping well     ASSESSMENT:  40 yo woman with > 5 year Hx of stomach Sx, recently worsened, hyperlipidemia contraception needs.        PLAN:  530.81 ESOPHAGEAL REFLUX (primary encounter diagnosis)  Note: 5 year hx stomach pain with no imaging, needs endoscopy. Screening for H Py Neg with blood recently, rescreen stool for specificity and guiac cards also provided. Sig wt gain since last appt, stomach pain is certainly not limiting appetite or bowel changes.   Plan: REFERRAL TO GENERAL SURGERY (INT), HELICOBACTER   PYLORI STOOL AG (INHOUSE)     V25.09 CONTRACEPTIVE MANGMT NEC  Note Plan: Asymptomatic and stable on OCPs, cont.     V70.0 ROUTINE MEDICAL EXAM  Note: screen for baseline evalulation.   Plan: REFERRAL TO OPHTHALMOLOGY (INT)     272.4 HYPERLIPIDEMIA NEC/NOS  Note: mild lipidemia  06/24/2004 Cholestel: 188 TRIS: 99 HDL: 53 LDL: 131  Plan: Reviewed importance of healthy lifestyle including diet low cholesterol foods an Saturated fat, aerobic  exercise >3 x weekly.      Pt verbalizes agreement with this Plan of Care.   RTC 2 mos for eval of Stomach Sx, sooner prn.   Will likely be in touch sooner about results.

## 2005-06-22 ENCOUNTER — Other Ambulatory Visit (HOSPITAL_BASED_OUTPATIENT_CLINIC_OR_DEPARTMENT_OTHER): Payer: Charity

## 2005-06-22 DIAGNOSIS — K219 Gastro-esophageal reflux disease without esophagitis: Secondary | ICD-10-CM

## 2005-06-22 NOTE — Nursing Note (Signed)
>>   Deborah [name], Deborah [name]     06/22/2005   11:42 am  Sent stool OE

## 2005-06-23 LAB — HELICOBACTER PYLORI STOOL AG

## 2005-07-04 ENCOUNTER — Ambulatory Visit (HOSPITAL_BASED_OUTPATIENT_CLINIC_OR_DEPARTMENT_OTHER): Payer: Charity

## 2005-07-04 ENCOUNTER — Other Ambulatory Visit (HOSPITAL_BASED_OUTPATIENT_CLINIC_OR_DEPARTMENT_OTHER): Payer: Self-pay

## 2005-07-04 VITALS — BP 110/64

## 2005-07-04 DIAGNOSIS — Z3009 Encounter for other general counseling and advice on contraception: Secondary | ICD-10-CM

## 2005-07-04 NOTE — Progress Notes (Signed)
ORAL CONTRACEPTIVE REVISIT  Counseling Notes     Since last annual visit, any significant changes in medical history (hospitalizations or surgery (past or planned), serious illnesses, new medications, smoking habits, social or sexual history, etc.)? No    Any problems/side effects? No.    Warning signs ("A C H E S") reviewed: yes    Taking pills correctly and consistently? Yes  Satisfied with method/wants to continue? Yes  Any questions or concerns about the method? No  Method specific counseling done at previous visit (s)? yes  STD/HIV counseling and risk assessment done/updated: yes  Consent forms signed and up-to-date: yes  Emergency contraception education/counseling done: yes  EC consent signed? N/A  Comments: Deborah [name] is a 40 year old female  Requesting OCP Rx. Pt. Is taking Levlite and PCP gave to pt. Rx for [facility] last visit. Pt. Free care is not covering OCP and pt. Wants Rx to a regular Pharmacy.  Nurse will call [facility] for [employer] on [address].  Pt. Last pap : 11/10/2004 - negative  Pt. Can call [facility] if has any question or concerns.

## 2005-07-04 NOTE — Telephone Encounter (Signed)
PER PT. REQUEST, LEVLITE 28 CALLED INTO [facility] IN [place], [phone].

## 2005-07-05 ENCOUNTER — Other Ambulatory Visit (HOSPITAL_BASED_OUTPATIENT_CLINIC_OR_DEPARTMENT_OTHER): Payer: Charity

## 2005-07-05 DIAGNOSIS — Z1212 Encounter for screening for malignant neoplasm of rectum: Secondary | ICD-10-CM

## 2005-07-05 LAB — FECAL OCCULT (POINT OF CARE) HOME TEST 3 CARDS
LOT #: 1160
OCCULT BLOOD: NEGATIVE
OCCULT BLOOD: NEGATIVE
OCCULT BLOOD: NEGATIVE

## 2005-07-05 NOTE — Nursing Note (Signed)
>>   [name], Deborah     07/05/2005   11:50 am  Received guiac cards x3, all negative.

## 2005-07-07 ENCOUNTER — Telehealth (HOSPITAL_BASED_OUTPATIENT_CLINIC_OR_DEPARTMENT_OTHER): Payer: Self-pay

## 2005-07-07 NOTE — Telephone Encounter (Signed)
TC to pt, reports that she's been having pain in her stomach "for a long time", saw Marchelle Folks Opdyke 2 wks ago and was sent to a specialist. Appt for specialist not until May.  Pt reports that pt is on stomach and is very sharp.  Pt is able to eat, but sometimes it hurts a lot after eating.  Per pt was given Pepcid at ER and it was helpful.  Pain scale 11/10.  Denies any N/V, not blood or anything else, but very painful.  Advised pt to seek Emergency Care.  Advised to call back directly if there are further questions, or if these symptoms fail to improve as anticipated or worsen.  The patient indicates understanding of these issues and agrees with the plan.

## 2005-07-07 NOTE — Telephone Encounter (Signed)
Staff Message copied by Lia Foyer on 07/07/2005 at 3:36 PM  ------   Message from: Alleen Borne   Created: 07/07/2005 at 3:27 PM   Regarding: Triage   Contact: [phone]    Deborah [name] [id number], 40 year old, female, [phone] (home) [phone] (work)    [name] NUMBER: [phone]  Cell phone:   Other phone:    Available times:    Patient's language of care: [place]    Patient needs a [place] interpreter.    Patient's PCP: .pcp    Person calling on behalf of patient: patient (self)    Calls today with questions and concerns. Pt has a lot of stomach pain.    Tx,  Lewie Chamber

## 2005-07-10 ENCOUNTER — Encounter (HOSPITAL_BASED_OUTPATIENT_CLINIC_OR_DEPARTMENT_OTHER): Payer: Self-pay

## 2005-07-10 ENCOUNTER — Telehealth (HOSPITAL_BASED_OUTPATIENT_CLINIC_OR_DEPARTMENT_OTHER): Payer: Self-pay | Admitting: Registered Nurse

## 2005-07-10 NOTE — Telephone Encounter (Signed)
Its fine with me if she goes to ED

## 2005-07-10 NOTE — Telephone Encounter (Signed)
TC to pt. Reports has seen her PCP already for her stomach issues and does have f/u with specialist in 2 mos but can not wait until then. Reports pain "is like a ball in my stomach". Reports is taking Protonix BID with NO relief of sx. Was seen in ER recently and given Cimetidine with some relief. Requesting script for same. Does not have health insurance. Will pay out of pocket. RN to d/w PCP and call pt with plan.    Deborah [name]:  Pls advise of plan. Shall we give her a little Cimetidine for now? Tx

## 2005-07-10 NOTE — Telephone Encounter (Signed)
Deborah [name]-   1. Be sure she is taking the protonix as ordered, 15-30 min prior to meal twice daily.   2. I know she stopped tob in 2005, reconfirm this since tob can worsen gerd  3. Caution for caffeine products since these can worsen gerd- coffee tea sodas  4. Have her try freq dosing of TUMs or rolaids    If not better in one week, she can see me.

## 2005-07-10 NOTE — Telephone Encounter (Signed)
Pt reports is already doing all as below. Thinking of going to ER now b/c "pain is too strong". Will again d/w PCP and call pt.    Amanda:  Pt already doing all as below (no tobacco in 3+yrs, NO caffeine etc.) Wants URGENT eval. ER? Tx

## 2005-07-10 NOTE — Progress Notes (Addendum)
Quick Note:    Reviewed Result. No new orders.     ______

## 2005-07-10 NOTE — Telephone Encounter (Signed)
Pt informed. Will go to ER for urgent eval and call this RN after w/plan.

## 2005-07-10 NOTE — Telephone Encounter (Signed)
Staff Message copied by Westly Pam, Temitope Griffing on 07/10/2005 at 9:20 AM  ------   Message from: Adele Dan   Created: 07/10/2005 at 9:13 AM   Regarding: triage   Contact: [phone]    Meriel Kelliher [id number], 40 year old, female, [phone] (home) [phone] (work)    [name] NUMBER: [phone]  Cell phone:   Other phone:    Available times:    Patient's language of care: [place]    Patient needs a [place] interpreter.    Patient's PCP: Orvan July, APRN, ANP    Person calling on behalf of patient: patient (self)    Calls today with a sick call.Pt. needs to talk to the nurse about medication.  Thanks,  Elease Hashimoto.

## 2005-07-18 ENCOUNTER — Other Ambulatory Visit (HOSPITAL_BASED_OUTPATIENT_CLINIC_OR_DEPARTMENT_OTHER): Payer: Charity | Admitting: Ophthalmology

## 2005-08-14 ENCOUNTER — Other Ambulatory Visit (HOSPITAL_BASED_OUTPATIENT_CLINIC_OR_DEPARTMENT_OTHER): Payer: Charity | Admitting: Surgery

## 2005-08-21 ENCOUNTER — Other Ambulatory Visit (HOSPITAL_BASED_OUTPATIENT_CLINIC_OR_DEPARTMENT_OTHER): Payer: Charity

## 2005-08-21 LAB — SURG SPEC CLINIC NOTE

## 2005-08-29 ENCOUNTER — Ambulatory Visit (HOSPITAL_BASED_OUTPATIENT_CLINIC_OR_DEPARTMENT_OTHER): Payer: Self-pay | Admitting: Internal Medicine

## 2005-09-07 ENCOUNTER — Encounter: Payer: Self-pay | Admitting: Surgery

## 2005-09-07 LAB — WHIDDEN-OPERATIVE REPORT

## 2005-09-07 LAB — HCG QUALITATIVE SERUM: HCG QUALITATIVE SERUM: NEGATIVE

## 2005-09-11 LAB — SURGICAL PATH SPECIMEN

## 2005-09-19 ENCOUNTER — Encounter (HOSPITAL_BASED_OUTPATIENT_CLINIC_OR_DEPARTMENT_OTHER): Payer: Self-pay

## 2005-09-19 ENCOUNTER — Ambulatory Visit (HOSPITAL_BASED_OUTPATIENT_CLINIC_OR_DEPARTMENT_OTHER): Payer: Charity

## 2005-09-19 VITALS — BP 100/60 | HR 94 | Temp 98.3°F | Wt 150.0 lb

## 2005-09-19 DIAGNOSIS — K219 Gastro-esophageal reflux disease without esophagitis: Secondary | ICD-10-CM

## 2005-09-19 DIAGNOSIS — Z0000 Encounter for general adult medical examination without abnormal findings: Secondary | ICD-10-CM

## 2005-09-19 DIAGNOSIS — N943 Premenstrual tension syndrome: Secondary | ICD-10-CM

## 2005-09-19 DIAGNOSIS — E785 Hyperlipidemia, unspecified: Secondary | ICD-10-CM

## 2005-09-19 MED ORDER — CALTRATE 600 + D 600-200 MG-IU OR TABS
ORAL_TABLET | ORAL | Status: DC
Start: 2005-09-19 — End: 2006-04-04

## 2005-09-19 MED ORDER — FLUOXETINE HCL 10 MG PO CAPS
ORAL_CAPSULE | ORAL | Status: DC
Start: 2005-09-19 — End: 2006-04-04

## 2005-09-19 NOTE — Progress Notes (Signed)
Deborah [name] is a 40 year old female who presents for     1. GERD- taking protonix 2-3 x daily, but does not take every day. Does NOT drink coffee or tea, occ sodas. - TOB. Reports occ heartburn type Sx for which she takes an extra PPI tablet, does have TUMS at home, but does not use them.     2. Is not taking calcium- not really exercising, not really eating healthier.     3. Contraception- taking OCPs with + SE PMS the 3rd week of the pills, - TOB, ACHES, denies SE. Monogamous denies DV risk.     Denies CP palps h/a dizziness nausea vomiting diarr constip fever chills.     Patient Active Problem List:   CONTRACEPTIVE MANGMT NEC [V25.09]   LUMP OR MASS IN BREAST [611.72]   HYPERLIPIDEMIA NEC/NOS [272.4]    Review of Patient's Allergies indicates:   Ibuprofen Swelling  Current outpatient prescriptions:  LEVLITE (28) 0.1-20 MG-MCG OR TABS, 1 TABLET DAILY, Disp: 3 packs, Rfl: 4  PROTONIX 40 MG OR TBEC, 1 tab bid, 1 por manana e 1 por noche, Disp: 60, Rfl: 12  CALTRATE 600 + D 600-200 MG-IU OR TABS, 1 TABLET DAILY, Disp: 30, Rfl: 0  FLUOXETINE HCL 10 MG OR CAPS, 1 pill daily the week prior to the menses, Disp: 30, Rfl: 3    Review of patient's family history indicates:   Hypertension Mother    Comment: alive.    Hypertension Father     Social History   Marital Status: Married Spouse Name:    Years of Education: Number of children:     Occupational History   None on file    Social History Main Topics   Tobacco Use: Quit Packs/Day: .5 Years: 10    Quit date: 06/28/2002   Comment: stopped 2005 Had smoked for 27 years!   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             Past  Surgical History:   CESAREAN DELIVERY ONLY    Comment: breech   BREAST BIOPSY SPECIMEN    Comment: s/p fibroadenoma on the L,   OBJECTIVE:  BP 100/60   Pulse 94   Temp (Src) 98.3 (Temporal)   Wt 150 lbs (68.0kg)   LMP 08/30/2005  Repeat BP 126/66 66 18  Gen- Alert, pleasant overweight woman in NAD.   HEENT- TMs pearly, nares patent, Perrla, non-enlarged thyroid.  CV- RRR, no murmur, clicks, gallops, rubs. No JVD/LLE. 2+ radial, fem, distal pulses bil.  R- vesicular BS bil. No cough adventitious BS.   AbD- soft rounded neg HSM, + BS, No CVA tenderness.   Skin- no rash, lesions, ulcers  Neuro- CN2-12 intact, nonfocal  Psych- normal insight, and affect. Coping well     ASSESSMENT:  40 yo woman with gerd s/p endoscopy pertinent only for benign gastritis, hyperlipidemia and PMS    PLAN:  530.81 ESOPHAGEAL REFLUX (primary encounter diagnosis)  OPERATIVE FINDINGS:  1. Generalized  mild gastritis, more prominent in the antrum without evidence of ulcerations. 2. No evidence of hiatal hernia. 3. No evidence of duodenitis or esophagitis.  DICTATED BY: [name]  ASSESSMENT AND PLAN  This 40 year old woman has a history and exam that is consistent with gastritis or ulcer disease. She does not have any concerning risk factors for malignancy. Given that the Helicobacter pylori test recently and was negative, I recommend proceeding with an upper endoscopy with biopsy, both to evaluate for any ulcer disease as well as to re-biopsy for Helicobacter pylori. Results for KENZEY, BIRKLAND CRISTINA (ZO#[id number]) as of 09/19/2005 3:48:50 PM  07/05/2004 serum HELICOBACTER PYLORI IGG: NEGATIVE  Reviewed and Electronically Signed By: [name]  Plan: cont on PPI bid, reviewed irritant triggers, and advised TUMS instead of extra dosing PPI.     272.4 HYPERLIPIDEMIA NEC/NOS  Note: mild lipidemia  06/24/2004 Cholesterol: 188 TRIS: 99 HDL: 53 LDL: 131  Plan: LIPID PANEL, HEMOGLOBIN A1C, BASIC METABOLIC    PANEL, ROUTINE  VENIPUNCTURE     V70.0 ROUTINE MEDICAL EXAM  Note: screen for baseline evalulation.   Plan: CALTRATE 600 + D 600-200 MG-IU OR TABS     625.4 PREMENSTRUAL TENSION  Note: Asymptomatic and stable except for anxiety prior to menses.   Plan: FLUOXETINE HCL 10 MG OR CAPS     Pt verbalizes agreement with this Plan of Care.   RTC 3 mos for PE PAP, sooner as needed.  Fasting labs prior to appt.

## 2005-09-20 ENCOUNTER — Ambulatory Visit: Payer: Charity | Admitting: Ophthalmology

## 2005-09-20 ENCOUNTER — Other Ambulatory Visit (HOSPITAL_BASED_OUTPATIENT_CLINIC_OR_DEPARTMENT_OTHER): Payer: Charity

## 2005-09-20 DIAGNOSIS — H5200 Hypermetropia, unspecified eye: Secondary | ICD-10-CM

## 2005-09-20 DIAGNOSIS — E785 Hyperlipidemia, unspecified: Secondary | ICD-10-CM

## 2005-09-20 DIAGNOSIS — H31009 Unspecified chorioretinal scars, unspecified eye: Secondary | ICD-10-CM

## 2005-09-20 DIAGNOSIS — H01009 Unspecified blepharitis unspecified eye, unspecified eyelid: Secondary | ICD-10-CM

## 2005-09-20 LAB — BASIC METABOLIC PANEL
BUN (UREA NITROGEN): 13 mg/dl (ref 6–20)
CALCIUM: 9.7 mg/dl (ref 8.6–10.0)
CARBON DIOXIDE: 28 mmol/L (ref 22–32)
CHLORIDE: 102 mmol/L (ref 101–111)
CREATININE: 0.7 mg/dl (ref 0.4–1.2)
Glucose Random: 83 mg/dl (ref 74–160)
POTASSIUM: 4.3 mmol/L (ref 3.5–5.1)
SODIUM: 136 mmol/L (ref 135–144)

## 2005-09-20 LAB — HEMOGLOBIN A1C: HEMOGLOBIN A1C: 4.9 % (ref 0–6.0)

## 2005-09-20 LAB — CHG LIPID PANEL
Cholesterol: 202 mg/dl — ABNORMAL HIGH (ref 0–200)
HIGH DENSITY LIPOPROTEIN: 51 mg/dl (ref 35–85)
LOW DENSITY LIPOPROTEIN DIRECT: 135 mg/dl — ABNORMAL HIGH (ref 0–100)
RISK FACTOR: 4 (ref ?–4.4)
TRIGLYCERIDES: 149 mg/dl (ref 0–150)

## 2005-09-20 NOTE — Nursing Note (Signed)
>>   Deborah [name], Deborah [name]     09/20/2005   8:54 am  Labs drawn.  By MR

## 2005-09-27 NOTE — Progress Notes (Addendum)
Quick Note:    Hyperlipidemiea education provided, plan to retest ~ 12 mos  ______

## 2005-10-10 ENCOUNTER — Other Ambulatory Visit (HOSPITAL_BASED_OUTPATIENT_CLINIC_OR_DEPARTMENT_OTHER): Payer: Self-pay

## 2005-10-10 ENCOUNTER — Telehealth (HOSPITAL_BASED_OUTPATIENT_CLINIC_OR_DEPARTMENT_OTHER): Payer: Self-pay

## 2005-10-10 DIAGNOSIS — K219 Gastro-esophageal reflux disease without esophagitis: Secondary | ICD-10-CM

## 2005-10-10 MED ORDER — PROTONIX 40 MG PO TBEC
DELAYED_RELEASE_TABLET | ORAL | Status: DC
Start: 2005-10-10 — End: 2006-04-04

## 2005-10-10 NOTE — Telephone Encounter (Signed)
With interpreter assistance, advised pt of the following:    1. Good stomach test results.   2. Dr Veto Kemps recommends twice daily stomach medication Protonix which I sent to the [facility].   3. Take before meals morning and evening.   4. See me November for eval stomach.     Pt verbalizes agreement with this Plan of Care.

## 2005-12-04 ENCOUNTER — Encounter (HOSPITAL_BASED_OUTPATIENT_CLINIC_OR_DEPARTMENT_OTHER): Payer: Self-pay

## 2005-12-12 ENCOUNTER — Ambulatory Visit (HOSPITAL_BASED_OUTPATIENT_CLINIC_OR_DEPARTMENT_OTHER): Payer: Charity

## 2005-12-12 ENCOUNTER — Encounter (HOSPITAL_BASED_OUTPATIENT_CLINIC_OR_DEPARTMENT_OTHER): Payer: Self-pay

## 2005-12-12 VITALS — BP 108/60 | HR 63 | Temp 98.3°F | Ht 61.5 in | Wt 154.5 lb

## 2005-12-12 DIAGNOSIS — Z01419 Encounter for gynecological examination (general) (routine) without abnormal findings: Secondary | ICD-10-CM

## 2005-12-12 NOTE — Progress Notes (Signed)
Declined interpreter.     Deborah [name] is a 40 year old female who presents for   Review Of Systems  Gen: doing ok  Skin: neg rash, lesions, open areas  Eyes: Glasses yes Last eye exam 2 mos ago.   Ears/Nose/Throat: neg ear pain, neg throat pain  Respiratory: neg SOB, cough, wheezing  Cardiovascular: denies fatigue neg CP, palps, headaches, dizziness, syncope.   Gastrointestinal: Takes PPI with relief of Sx, neg cramping, constipation, diarrhea, blood in stools  Genitourinary: G 1 P 1 normal menses on OCPs, neg dysuria, hematuria, polyuria  Musculoskeletal: neg joint complaints, physical limitations  Neurologic: neg numbness tingling fingers/toes, memory problems,   Psychiatric: PHQ 9 = 0  Emotional supports include family, friends, spouse. Does NOT exercise Sleeping Well   Hematologic/Lymphatic/Immunologic: neg bleeding problems, no lymph nodes noted  Endocrine: neg excess fatigue or excitability, neg wt changes,      Patient Active Problem List:   CONTRACEPTIVE MANGMT NEC [V25.09]   LUMP OR MASS IN BREAST [611.72]   HYPERLIPIDEMIA NEC/NOS [272.4]   ESOPHAGEAL REFLUX [530.81]    Review of Patient's Allergies indicates:   Ibuprofen Swelling  Current outpatient prescriptions:  PROTONIX 40 MG OR TBEC, 1 tablet morning and 1 tablet evening, Disp: 60, Rfl: 4  LEVLITE (28) 0.1-20 MG-MCG OR TABS, 1 TABLET DAILY, Disp: 3 packs, Rfl: 4  PROTONIX 40 MG OR TBEC, 1 tab bid, 1 por manana e 1 por noche, Disp: 60, Rfl: 12  CALTRATE 600 + D 600-200 MG-IU OR TABS, 1 TABLET DAILY, Disp: 30, Rfl: 0  FLUOXETINE HCL 10 MG OR CAPS, 1 pill daily the week prior to the menses, Disp: 30, Rfl: 3    Review of patient's family history indicates:   Hypertension Mother    Comment: alive.    Hypertension Father     Social History   Marital Status: Married Spouse Name:    Years of Education: Number of children:     Occupational History   None on file    Social History Main Topics   Tobacco Use: Quit Packs/Day: .5 Years: 73    Quit date:  06/28/2002   Comment: stopped 2005 Had smoked for 27 years!   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          Past Surgical History:   CESAREAN DELIVERY ONLY    Comment: breech   BREAST BIOPSY SPECIMEN    Comment: s/p fibroadenoma on the L,   OBJECTIVE:  BP 108/60   Pulse 63   Temp (Src) 98.3 (Temporal)   Ht 5' 1.5" (1.20m)   Wt 154 lbs 8.0 oz (70.1kg)   LMP 11/18/2005  Repeat BP 126/76 68 20  Most Recent Weight Reading(s)   Date: Wt:   12/12/2005 154 lbs 8.0 oz (70.081 kg)   09/19/2005 150 lbs (68.040 kg)   06/21/2005 148 lbs (67.132 kg)   11/10/2004 129 lbs 8.0 oz (58.741 kg)   10/07/2004 158 lbs (71.668 kg)    Gen-  Alert, pleasant, woman in NAD.   HEENT- TMs pearly, nares patent, Perrla, non-enlarged thyroid.  CV- RRR, no murmur, clicks, gallops, rubs.  R- vesicular BS bil. No cough adventitious BS.   GU: EGBUS gross nl, no lesions,   VAG: tone nl, scant white d/c  CX: pink, no CMT, closed   UT: sm, midplane, n/t  AD: n/t, no masses b/l  ABD- + BS, no HSM, no hernias. No CVA Tenderness.   Lymph- no supra, axillary, inguinal nodes.  Muscle- normal tone, full ROM, no C/C/E  Skin- no rash, lesions, ulcers  Neuro- CN2-12 intact, nonfocal  Psych- normal insight, and affect. Coping well    ASSESSMENT:  40 yo woman here for GYN exam.     PLAN:  V72.31 ROUTINE GYN EXAMINATION (primary encounter diagnosis)  Note: screen for baseline evalulation.   Plan: AMPLIFIED GENPROBE CHLAM/GC, PAP SMEAR THIN    PREP       Pt verbalizes agreement with this Plan of Care.   RTC as needed.

## 2005-12-14 LAB — CHLAMYDIA GC NAAT
GENPROBE CHLAMYDIA: NEGATIVE
GENPROBE GC: NEGATIVE

## 2005-12-14 NOTE — Progress Notes (Addendum)
Quick Note:    Reviewed Result. No new orders.     ______

## 2006-01-01 LAB — CYTOPATH, C/V, THIN LAYER

## 2006-04-04 ENCOUNTER — Ambulatory Visit (HOSPITAL_BASED_OUTPATIENT_CLINIC_OR_DEPARTMENT_OTHER): Payer: No Typology Code available for payment source | Admitting: Internal Medicine

## 2006-04-04 ENCOUNTER — Encounter (HOSPITAL_BASED_OUTPATIENT_CLINIC_OR_DEPARTMENT_OTHER): Payer: Self-pay | Admitting: Internal Medicine

## 2006-04-04 VITALS — BP 110/60 | HR 73 | Temp 97.2°F | Wt 157.0 lb

## 2006-04-04 DIAGNOSIS — T783XXA Angioneurotic edema, initial encounter: Secondary | ICD-10-CM

## 2006-04-04 DIAGNOSIS — Z3009 Encounter for other general counseling and advice on contraception: Secondary | ICD-10-CM

## 2006-04-04 DIAGNOSIS — K2960 Other gastritis without bleeding: Secondary | ICD-10-CM | POA: Insufficient documentation

## 2006-04-04 DIAGNOSIS — K219 Gastro-esophageal reflux disease without esophagitis: Secondary | ICD-10-CM

## 2006-04-04 DIAGNOSIS — R1013 Epigastric pain: Secondary | ICD-10-CM

## 2006-04-04 DIAGNOSIS — Z886 Allergy status to analgesic agent status: Secondary | ICD-10-CM | POA: Insufficient documentation

## 2006-04-04 HISTORY — DX: Epigastric pain: R10.13

## 2006-04-04 MED ORDER — LEVLITE (28) 0.1-20 MG-MCG PO TABS
ORAL_TABLET | ORAL | Status: DC
Start: 2006-04-04 — End: 2006-09-11

## 2006-04-04 MED ORDER — PROTONIX 40 MG PO TBEC
DELAYED_RELEASE_TABLET | ORAL | Status: DC
Start: 2006-04-04 — End: 2007-07-19

## 2006-04-04 NOTE — Progress Notes (Signed)
Deborah [name] is a 40 year old female    Review of Patient's Allergies indicates:   Ibuprofen Swelling   Nsaids Rash, Swelling   Comment: Labial edema with alleve, rash as well    Current outpatient prescriptions:  LEVLITE (28) 0.1-20 MG-MCG OR TABS, 1 TABLET DAILY, Disp: 3 packs, Rfl: 4    Patient Active Problem List:   CONTRACEPTIVE MANGMT NEC [V25.09]   LUMP OR MASS IN BREAST [611.72]   HYPERLIPIDEMIA NEC/NOS [272.4]   ESOPHAGEAL REFLUX [530.81]    Social History   Marital Status: Married Spouse Name:    Years of Education: Number of children:     Occupational History   None on file    Social History Main Topics   Tobacco Use: Quit Packs/Day: .5 Years: 44    Quit date: 06/28/2002   Comment: stopped 2005 Had smoked for 27 years!   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       Previous pt of A Opdyke, APRN here for urgent care visit.    Notes epigastric pain x 1 day, intermittent, 5-6/10, "colic like", non-radiating, without N/V/D or blood in stool. Notes previous epigastric pain with endoscopy in 5/07. Using PPI irregularly only, with dose today, with pt noting little effect once pain starts. Also notes bloating at abdomen.    Notes that this pain is different than previous, feeling "inflamed when I stand." notes history of unclear type of hepatitis with unclear treatment in [place]. Denies any EtOH use.    Also concerned about back pain, s/p PT with question of vert fx with pt opting against repair.    Filed Vitals:   ----------------------       04/04/2006        2:20 PM     ----------------------     BP:   110/60      Pulse:   73      Temp:   97.2 F      TempSrc:  Temporal       Weight:   157 lbs     ----------------------   PE: WN WD in NAD  HEENT - NCAT EOMI orophar nml no oral lesions, upper lip mildly edematous  Neck - supple no LAD no masses  CV - RRR s1s2 no MRGs  Abd - soft NT ND bs nml x 4 no G/R or masses  Neuro - A&O x3      Results for REBECAH, DANGERFIELD CRISTINA (ZO#[id number]) as of 04/04/2006 3:32:18 PM    07/05/2004 2:34:00 PM  HELICOBACTER PYLORI IGG: NEGATIVE    ---  PREOPERATIVE DIAGNOSIS(ES):  Epigastric pain.    POSTOPERATIVE DIAGNOSIS(ES):  Gastritis.    OPERATION:  Upper endoscopy with biopsy.    SURGEON:  Jonny Ruiz, MD    ASSISTANT:  None.    ANESTHESIA:  Nursing conscious sedation.    INDICATIONS:  This is a 40 year old woman who was seen and evaluated in the clinic for a   history of epigastric and left upper  quadrant abdominal pain. She had been   H. pylori tested and was negative. The risks and benefits of an upper   endoscopy with biopsy were discussed with her and she wished to proceed.  OPERATIVE FINDINGS:  1. Generalized mild gastritis, more prominent in the antrum without   evidence of ulcerations. 2. No evidence of hiatal hernia.  3. No evidence of duodenitis or esophagitis.    PROCEDURE DESCRIPTION:  After having obtained the proper consent and after routine identification   of the patient, she was brought to the endoscopy suite and the back of her   throat was anesthetized with viscus lidocaine and topical Cetacaine. She   was placed under nursing conscious sedation without difficulty. The   endoscope was passed through a right block and down into the esophagus under  direct vision. It was advanced into the stomach, which was insufflated   with air. It was then passed through the pylorus into the first portion of   the duodenum. The duodenum appeared normal and there was no evidence of   ulceration or inflammation. The scope was withdrawn back into the antrum.   Biopsies were taken in the antrum to rule out H. pylori. There was some   generalized mild  gastritis in the stomach, which was more prominent in the   antrum. Retroflexion showed a normal appearing general endotracheal   junction without evidence of hiatal hernia. There was no evidence of   ulceration. The scope was withdrawn to the GE junction and two biopsies   were taken of the GE junction. The Z-line appeared to have a few very   mildly prominent tongues of mucosa extending more proximally, but clinically  did not appear suspicious for Barrett's esophagus. The scope was then   withdrawn, completely and the stomach was desufflated there. The scope was   withdrawn and to brought to the recovery room in excellent condition. There  were no complications. The patient tolerated the procedure well. Dr.   Veto Kemps was present to perform the entire procedure.    ZO:[id number]  D: 09/07/05 11:54 T: 09/07/05 12:29 DOCUMENT: [id number]  DICTATED BY: [name]  ----    789.06 ABDOMINAL PAIN EPIGASTRIC (primary encounter diagnosis)  Note: concern for gastritis, untreated with pt not using PPI regularly vs. Possible hepatitis or sequelae with unclear history vs. Less likely pancreatitis vs. Less likely malignancy  Plan: HEP B SURFACE ANTIGEN, HEP B SURFACE ANTIBODY,    HEP A ANTIBODY, IGM, HEPATITIS C ANTIBODY,    HEPATIC FUNCTION PANEL, AMYLASE, ROUTINE    VENIPUNCTURE   Advised to use PPI daily; agrees    530.81 ESOPHAGEAL REFLUX  Note: as above  Plan: PROTONIX 40 MG OR TBEC       535.40 GASTRITIS NEC W/O HEMORRH  Note: as above  Plan: as above    995.1 ANGIONEUROTIC EDEMA  Note: sxs, now resolving  Plan: advised to avoid all NSAIDs; if needs meds for back pain, to use acetaminophen only once HFTs resulted as normal; agrees    Back pain; will review chart further for history of previously treated back pain    Renewed OCP    F/u c PCP prn

## 2006-04-05 ENCOUNTER — Encounter (HOSPITAL_BASED_OUTPATIENT_CLINIC_OR_DEPARTMENT_OTHER): Payer: Self-pay | Admitting: Internal Medicine

## 2006-04-14 LAB — HEPATITIS B SURFACE ANTIBODY: HEPATITIS B SURFACE ANTIBODY: NEGATIVE

## 2006-04-14 LAB — CHG HEPATIC FUNCTION PANEL
ALANINE AMINOTRANSFERASE: 15 IU/L (ref 7–35)
ALBUMIN: 4.1 g/dl (ref 3.4–4.8)
ALKALINE PHOSPHATASE: 64 IU/L (ref 25–106)
ASPARTATE AMINOTRANSFERASE: 21 IU/L (ref 8–34)
BILIRUBIN DIRECT: 0.1 mg/dl (ref 0.0–0.2)
BILIRUBIN TOTAL: 0.2 mg/dl (ref 0.2–1.1)
INDIRECT BILIRUBIN: 0.1 mg/dl
TOTAL PROTEIN: 7.4 g/dl (ref 5.9–7.5)

## 2006-04-14 LAB — HEPATITIS A ANTIBODY IGM: HEPATITIS A ANTIBODY IGM: NEGATIVE

## 2006-04-14 LAB — HEPATITIS B SURFACE ANTIGEN: HEPATITIS B SURFACE ANTIGEN: NONREACTIVE

## 2006-04-14 LAB — AMYLASE: AMYLASE: 80 U/L (ref 15–133)

## 2006-04-14 LAB — HEPATITIS C ANTIBODY: HEPATITIS C ANTIBODY: NEGATIVE

## 2006-04-17 ENCOUNTER — Encounter (HOSPITAL_BASED_OUTPATIENT_CLINIC_OR_DEPARTMENT_OTHER): Payer: Self-pay | Admitting: Internal Medicine

## 2006-04-17 ENCOUNTER — Ambulatory Visit (HOSPITAL_BASED_OUTPATIENT_CLINIC_OR_DEPARTMENT_OTHER): Payer: No Typology Code available for payment source | Admitting: Internal Medicine

## 2006-04-17 VITALS — BP 100/62 | HR 81 | Temp 99.4°F | Wt 155.0 lb

## 2006-04-17 DIAGNOSIS — R1013 Epigastric pain: Secondary | ICD-10-CM

## 2006-04-17 DIAGNOSIS — K2960 Other gastritis without bleeding: Secondary | ICD-10-CM

## 2006-04-17 NOTE — Progress Notes (Signed)
Deborah [name] is a 41 year old female    Review of Patient's Allergies indicates:   Ibuprofen Swelling   Nsaids Rash, Swelling   Comment: Labial edema with alleve, rash as well   Acetomenaphthone (m* Rash   Comment: Rash at face and torso    Current outpatient prescriptions:  PROTONIX 40 MG OR TBEC, 1 tablet morning and 1 tablet evening, Disp: 30, Rfl: 11  LEVLITE (28) 0.1-20 MG-MCG OR TABS, 1 TABLET DAILY, Disp: 1 pack, Rfl: 5    Patient Active Problem List:   CONTRACEPTIVE MANGMT NEC [V25.09]   LUMP OR MASS IN BREAST [611.72]   HYPERLIPIDEMIA NEC/NOS [272.4]   ESOPHAGEAL REFLUX [530.81]   GASTRITIS NEC W/O HEMORRH [535.40]   ABDOMINAL PAIN EPIGASTRIC [789.06]   ANGIONEUROTIC EDEMA [995.1]    Social History   Marital Status: Married Spouse Name:    Years of Education: Number of children:     Occupational History   None on file    Social History Main Topics   Tobacco Use: Quit Packs/Day: .5 Years: 55    Quit date: 06/28/2002   Comment: stopped 2005 Had smoked for 27 years!   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       Pt returns today with continued abdominal pain.    Notes that she took tylenol recently and soon after developed erythema with itching at R face and below R breast that resolved with benadryl. She also developed worsening epigastric pain. Notes baseline 5/10, "contraction like", at epigastrium that is intermittently punctuated by more severe pain that is similar. Denies N/V/D. Denies any PO triggers for sxs. Taking PPI regularly. Wondering if Lansoprazole, which she took previously, might help more than this. Notes pain so bad that she considered going to the ED  for evaluation. Denies any exacerbation or improvement with PO intake. COncerned about GB dx.    Filed Vitals:   ----------------------       04/17/2006        4:00 PM     ----------------------     BP:   100/62      Pulse:   81      Temp:   99.4 F      TempSrc:  Temporal      Weight:   155 lbs     ----------------------   PE: WN WD in NAD  HEENT - NCAT EOMI orophar nml  Abd - soft NT ND bs nml x 4 no HSM, G/R or masses  Neuro - A&O x 3    Results for Deborah [name] (ZO#[id number]) as of 04/17/2006 4:51:11 PM    07/05/2004 2:34:00 PM  HELICOBACTER PYLORI IGG: NEGATIVE      Results for Deborah [name] (WJ#[id number]) as of 04/17/2006 4:51:11 PM    04/04/2006 3:47:00 PM  TOTAL PROTEIN: 7.4  ALBUMIN: 4.1  BILIRUBIN TOTAL: 0.2  ALKALINE PHOSPHATASE: 64  ASPARTATE AMINOTRANSFERAS: 21  ALANINE AMINOTRANSFERASE: 15  AMYLASE: 80  BILIRUBIN DIRECT: 0.1  BILIRUBIN INDIRECT:  0.1      -------  [facility] REPORT:   [facility]  [address]  [place], [place] [address]    [facility]  Medical Records Department    [facility] REPORT    Status: Draft    Name : Deborah [name]  Account #: [id number]  Unit #: [id number]  DOB: Aug 23, 1965 Sex: F  Location: [facility] Status: REG SDC    CC List:  [name]      DATE OF OPERATION:  Sep 07, 2005    PREOPERATIVE DIAGNOSIS(ES):  Epigastric pain.    POSTOPERATIVE DIAGNOSIS(ES):  Gastritis.    OPERATION:  Upper endoscopy with biopsy.    SURGEON:  Jonny Ruiz, MD    ASSISTANT:  None.    ANESTHESIA:  Nursing conscious sedation.    INDICATIONS:  This is a 41 year old woman who was seen and evaluated in the clinic for a   history of epigastric and left upper quadrant abdominal pain. She had been   H. pylori tested and was negative. The risks and benefits of an upper   endoscopy with biopsy were discussed with her and she wished to proceed.  OPERATIVE FINDINGS:  1. Generalized mild gastritis, more prominent in the antrum  without   evidence of ulcerations. 2. No evidence of hiatal hernia.  3. No evidence of duodenitis or esophagitis.    PROCEDURE DESCRIPTION:  After having obtained the proper consent and after routine identification   of the patient, she was brought to the endoscopy suite and the back of her   throat was anesthetized with viscus lidocaine and topical Cetacaine. She   was placed under nursing conscious sedation without difficulty. The   endoscope was passed through a right block and down into the esophagus under  direct vision. It was advanced into the stomach, which was insufflated   with air. It was then passed through the pylorus into the first portion of   the duodenum. The duodenum appeared normal and there was no evidence of   ulceration or inflammation. The scope was withdrawn back into the antrum.   Biopsies were taken in the antrum to rule out H. pylori. There was some   generalized mild gastritis in the stomach, which was more prominent in the   antrum. Retroflexion showed a normal appearing general endotracheal   junction without evidence of hiatal hernia. There was no evidence of   ulceration. The scope was withdrawn to the GE junction and two biopsies   were taken of the GE junction. The Z-line appeared to have a few very   mildly prominent tongues of mucosa extending more proximally, but clinically  did not appear suspicious for Barrett's esophagus. The scope was then   withdrawn, completely and the stomach was desufflated there. The scope was   withdrawn and to brought to the recovery room in excellent condition. There  were no complications. The patient tolerated the procedure well. Dr.   Veto Kemps was present to perform the entire procedure.    UE:[id number]  D: 09/07/05 11:54 T: 09/07/05 12:29 DOCUMENT: [id number]  DICTATED BY: [name]  -----    789.06 ABDOMINAL PAIN EPIGASTRIC (primary encounter diagnosis)  Note: unclear etiology with no improvement with PPI bid - gastritis, mild  on endoscopy, nml LFTs - no exacerbation with or without PO intake - evaluate for possible biliary tract disease  Plan: REFERRAL TO GASTROENTEROLOGY (INT)   U/S RUQ    535.40 GASTRITIS NEC W/O HEMORRH  Note:  as above  Plan: REFERRAL TO GASTROENTEROLOGY (INT)   As above    F/u c PCP prn

## 2006-04-18 ENCOUNTER — Other Ambulatory Visit (HOSPITAL_BASED_OUTPATIENT_CLINIC_OR_DEPARTMENT_OTHER): Payer: Self-pay | Admitting: Internal Medicine

## 2006-04-18 ENCOUNTER — Telehealth (HOSPITAL_BASED_OUTPATIENT_CLINIC_OR_DEPARTMENT_OTHER): Payer: Self-pay | Admitting: Internal Medicine

## 2006-04-18 LAB — US ABDOMEN COMPLETE

## 2006-04-18 NOTE — Telephone Encounter (Signed)
Please inform patients of RUQ U/S results 04/18/06:    Normal    Thanks,    Rob

## 2006-04-19 ENCOUNTER — Encounter (HOSPITAL_BASED_OUTPATIENT_CLINIC_OR_DEPARTMENT_OTHER): Payer: Self-pay | Admitting: Internal Medicine

## 2006-04-25 ENCOUNTER — Encounter (HOSPITAL_BASED_OUTPATIENT_CLINIC_OR_DEPARTMENT_OTHER): Payer: Self-pay | Admitting: Internal Medicine

## 2006-05-21 ENCOUNTER — Other Ambulatory Visit (HOSPITAL_BASED_OUTPATIENT_CLINIC_OR_DEPARTMENT_OTHER): Payer: Self-pay

## 2006-05-21 DIAGNOSIS — Z1231 Encounter for screening mammogram for malignant neoplasm of breast: Secondary | ICD-10-CM

## 2006-05-21 LAB — MA SCREENING MAMMO BILATERAL WITH CAD

## 2006-06-27 ENCOUNTER — Ambulatory Visit (HOSPITAL_BASED_OUTPATIENT_CLINIC_OR_DEPARTMENT_OTHER): Payer: PRIVATE HEALTH INSURANCE | Admitting: Gastroenterology

## 2006-06-27 LAB — CHG HEPATIC FUNCTION PANEL
ALANINE AMINOTRANSFERASE: 13 IU/L (ref 7–35)
ALBUMIN: 3.7 g/dl (ref 3.4–4.8)
ALKALINE PHOSPHATASE: 65 IU/L (ref 25–106)
ASPARTATE AMINOTRANSFERASE: 19 IU/L (ref 8–34)
BILIRUBIN DIRECT: 0.1 mg/dl (ref 0.0–0.2)
BILIRUBIN TOTAL: 0.4 mg/dl (ref 0.2–1.1)
INDIRECT BILIRUBIN: 0.3 mg/dl
TOTAL PROTEIN: 7 g/dl (ref 5.9–7.5)

## 2006-07-01 ENCOUNTER — Encounter (HOSPITAL_BASED_OUTPATIENT_CLINIC_OR_DEPARTMENT_OTHER): Payer: Self-pay | Admitting: Internal Medicine

## 2006-07-02 LAB — GI CENTER CLINIC NOTE

## 2006-07-03 ENCOUNTER — Ambulatory Visit (HOSPITAL_BASED_OUTPATIENT_CLINIC_OR_DEPARTMENT_OTHER): Payer: Self-pay

## 2006-07-03 NOTE — Progress Notes (Signed)
41 year old woman presents with recurrrence of intermittent left heel pain x 3 days.    Last episode was last year and resolved spontaneously.   Presently, pain is moderate and is helped with tylenol 2/day (she denies allergy to tylenol)    She wears sneakers most of the time with good heel support. Unclear as to why this would come on.    Current outpatient prescriptions prior to encounter:  PROTONIX 40 MG OR TBEC, 1 tablet morning and 1 tablet evening, Disp: 30, Rfl: 11  LEVLITE (28) 0.1-20 MG-MCG OR TABS, 1 TABLET DAILY, Disp: 1 pack, Rfl: 5      Patient Active Problem List:   CONTRACEPTIVE MANGMT NEC [V25.09]   LUMP OR MASS IN BREAST [611.72]   HYPERLIPIDEMIA NEC/NOS [272.4]   ESOPHAGEAL REFLUX [530.81]   GASTRITIS NEC W/O HEMORRH [535.40]   ABDOMINAL PAIN EPIGASTRIC [789.06]   ANGIONEUROTIC EDEMA [995.1]        Px    Left foot appears normal - no inflammation, no tenderness    I/P    Left heel pain recurrence - cause unclear  Xray foot, refer to podiatry, consider heel pad prn, tylenol up to max of 8 regular tabs or 4 extra-strength.

## 2006-07-12 ENCOUNTER — Other Ambulatory Visit (HOSPITAL_BASED_OUTPATIENT_CLINIC_OR_DEPARTMENT_OTHER): Payer: Self-pay | Admitting: Internal Medicine

## 2006-07-12 LAB — XR FOOT LEFT MINIMUM 3 VIEWS

## 2006-07-25 ENCOUNTER — Other Ambulatory Visit (HOSPITAL_BASED_OUTPATIENT_CLINIC_OR_DEPARTMENT_OTHER): Payer: PRIVATE HEALTH INSURANCE

## 2006-07-26 ENCOUNTER — Ambulatory Visit (HOSPITAL_BASED_OUTPATIENT_CLINIC_OR_DEPARTMENT_OTHER): Payer: PRIVATE HEALTH INSURANCE | Admitting: Gastroenterology

## 2006-07-27 ENCOUNTER — Encounter (HOSPITAL_BASED_OUTPATIENT_CLINIC_OR_DEPARTMENT_OTHER): Payer: Self-pay | Admitting: Internal Medicine

## 2006-07-27 DIAGNOSIS — M722 Plantar fascial fibromatosis: Secondary | ICD-10-CM

## 2006-07-27 HISTORY — DX: Plantar fascial fibromatosis: M72.2

## 2006-07-27 LAB — SURG SPEC CLINIC NOTE

## 2006-08-22 ENCOUNTER — Ambulatory Visit (HOSPITAL_BASED_OUTPATIENT_CLINIC_OR_DEPARTMENT_OTHER): Payer: PRIVATE HEALTH INSURANCE

## 2006-09-11 ENCOUNTER — Ambulatory Visit (HOSPITAL_BASED_OUTPATIENT_CLINIC_OR_DEPARTMENT_OTHER): Payer: PRIVATE HEALTH INSURANCE

## 2006-09-11 VITALS — BP 104/68 | HR 77 | Temp 98.1°F | Wt 158.0 lb

## 2006-09-11 DIAGNOSIS — R531 Weakness: Secondary | ICD-10-CM

## 2006-09-11 DIAGNOSIS — IMO0001 Reserved for inherently not codable concepts without codable children: Secondary | ICD-10-CM

## 2006-09-11 LAB — BLOOD COUNT COMPLETE AUTO&AUTO DIFRNTL WBC
BASOPHIL %: 0.3 % (ref 0.0–2.0)
EOSINOPHIL %: 1.6 % (ref 0.0–7.0)
HEMATOCRIT: 37.9 % (ref 36.0–48.0)
HEMOGLOBIN: 13 g/dl (ref 12.0–16.0)
LYMPHOCYTE %: 30.7 % (ref 13.0–39.0)
MEAN CORP HGB CONC: 34.2 g/dl (ref 32.0–36.0)
MEAN CORPUSCULAR HGB: 31.7 pg (ref 27.0–33.0)
MEAN CORPUSCULAR VOL: 92.6 fl (ref 80.0–100.0)
MEAN PLATELET VOLUME: 8.9 fl (ref 6.4–10.8)
MONOCYTE %: 6.9 % (ref 1.0–12.0)
NEUTROPHIL %: 60.5 % (ref 46.0–79.0)
PLATELET COUNT: 287 10*3/uL (ref 150–400)
RBC DISTRIBUTION WIDTH: 12.1 % (ref 11.5–14.3)
RED BLOOD CELL COUNT: 4.1 M/uL — ABNORMAL LOW (ref 4.50–5.10)
WHITE BLOOD CELL COUNT: 10.8 10*3/uL (ref 4.0–10.8)

## 2006-09-11 LAB — URINE DIP (POINT OF CARE)
BILIRUBIN, URINE: NEGATIVE mg/dl (ref 0–0)
GLUCOSE, URINE: NORMAL mg/dl (ref 0–0)
KETONE, URINE: NEGATIVE mg/dl (ref 0–0)
LEUKOCYTE ESTERASE: 75 Leu/mcl — AB (ref 0–0)
NITRITE, URINE: NEGATIVE
PH URINE: 5.5 (ref 5.0–8.0)
SPECIFIC GRAVITY URINE: >1.03 (ref 1.003–1.030)
UROBILINOGEN URINE: NORMAL mg/dl (ref 0.2–1.0)

## 2006-09-11 LAB — BASIC METABOLIC PANEL
ANION GAP: 10 mmol/L (ref 2–25)
BUN (UREA NITROGEN): 12 mg/dl (ref 6–20)
CALCIUM: 9.8 mg/dl (ref 8.6–10.0)
CARBON DIOXIDE: 27 mmol/L (ref 22–32)
CHLORIDE: 102 mmol/L (ref 101–111)
CREATININE: 0.6 mg/dl (ref 0.4–1.2)
ESTIMATED GLOMERULAR FILT RATE: >60 mL/min (ref 60–116)
Glucose Random: 88 mg/dl (ref 74–160)
POTASSIUM: 4.3 mmol/L (ref 3.5–5.1)
SODIUM: 139 mmol/L (ref 135–144)

## 2006-09-11 LAB — THYROID SCREEN TSH REFLEX FT4: THYROID SCREEN TSH REFLEX FT4: 1.45 u[IU]/mL (ref 0.34–5.60)

## 2006-09-11 LAB — URINE PREGNANCY TEST (POINT OF CARE): HCG QUALITATIVE URINE: NEGATIVE

## 2006-09-11 NOTE — Progress Notes (Signed)
41 year old Deborah [name] presents to primary care clinic reporting generalized and "complete" weakness x 2 weeks, feeling "faint." Reports stomach pains before this sensation. Usually self-limiting. Last time happened 3 days ago, at times can happen 2-3x day.    No recent illness. Only notes seasonal allergies. No ear pain, dizziness or light lightheaded.    Not skipping meal/no diet pills. Reports dringing 4-5cups fluid a day.    LMP 08/29/06, reports taking OCPs QD regularly. Currently not sexually active.    Works 7 days a week as a [employer]. Not sleeping well, does not feel rested.    ROS  Denies fevers, chills, chest pain, nausea, vomiting, dysuria, or changes in bowel pattern.    Patient Active Problem List:   CONTRACEPTIVE MANGMT NEC [V25.09]   LUMP OR MASS IN BREAST [611.72]   HYPERLIPIDEMIA NEC/NOS [272.4]   ESOPHAGEAL REFLUX [530.81]   GASTRITIS NEC W/O HEMORRH [535.40]   ABDOMINAL PAIN EPIGASTRIC [789.06]   ANGIONEUROTIC EDEMA [995.1]   Plantar Fasciitis, L [728.39F]    Review of Patient's Allergies indicates:   Ibuprofen Swelling   Nsaids Rash, Swelling   Comment: Labial edema with alleve, rash as well   Pollen extract/tree* Runny Nose   Comment: HA, itchy watery eyes    Current outpatient prescriptions:   [facility] OCP- "nordette 1 tab pO QD" since 3/08  PROTONIX 40 MG OR TBEC, 1 tablet morning and 1 tablet evening    PHYSICAL EXAM:  GENERAL SURVEY/PSYCH: 41 year old [name], NAD. Alert, cooperative, & appropriately dressed for weather. Maintains eye contact. Appears to be a reliable historian.     VS: BP 104/68   Pulse 77   Temp (Src) 98.1 F (36.7 C) (Temporal)   Wt 158 lb (71.668 kg)   SpO2 99%   LMP 08/29/2006    HEAD: AT/NC, head erect & midline; scalp pink, freely movable & without lesions or tenderness; well-spaced, symmetric facial features without edema or puffiness. No frontal or maxillary sinus tenderness with palpation bil.    EYES: Bil lids & globes  symmetrical, without ptosis. Sclera white, conjunctiva pink & moist. Vision fields full by confrontation. Bil EOMs intact & full without nystagmus. Bil irides brown with PERRL OU.     EARS: Auricles in proper alignment without lesions, masses or tenderness; auditory canals with small amount of cerumen. TMs-grey, translucent with light reflex & bony prominences present bil. No perforations, retractions, fluid or bulging noted to bil TMs. Conversational hearing appropriate.     NOSE: Skin intact, septum midline & patent bil, turbinates unremarkable with mucosa pink & moist; no polyps or discharge noted bil.     MOUTH & THROAT: Lips intact. MMM, pink & intact without ulcerations. Teeth in good repair; tongue midline without deviation, tremor, fasciculation or lesions; Pharynx without erythema, uvula midline with elevation of soft palate with phonation; tonsils 1+ bil without exudates.     NECK: Neck supple with trachea midline. Thyroid & cartilage move with swallowing. No TM, or nodules noted bil. FROM with appropriate strength.     LYMPH: No palpable auricular, cervical, supraclavicular, axillary, or inguinal lymphadenopathy noted bil.    RESP: Breathing even, quiet, & unlabored. No cough observed. (A:P) LS CTA bil without adventitious breath sounds upon auscultation.     CV: HR-RRR, No M/R/G.     PLAN:  780.79G Weakness Generalized (primary encounter diagnosis)  Comment: Needs further w/u. R/O pregnancy, dehydration, thyroid dsfxn, electrolyte imbalance or anemia.  Plan: RTC 2-3 weeks.  1) drink plenty of fluids AT LEAST 2 liters/day [warm fluids/tea/soup]  2) get as much REST as possible  3) hand hygiene  4) diet, high in fruits and veggies    V25.9N Contraception- SELF-MEDICATING OCP from [place]  Comment: updated med list. MAde aware may not be for everyone-   Most Recent BP Reading(s)   Date: BP:   09/11/2006 104/68  Plan: assess BP regularly.    RTC: 2-3 weeks to reassess weakness if NO improvement / sooner  PRN    OK to call pt if lab results ABN  Telephone Information:  Home Phone [phone]  Work Phone [phone]

## 2006-09-11 NOTE — Patient Instructions (Signed)
1) drink plenty of fluids AT LEAST 2 liters/day [warm fluids/tea/soup]  2) get as much REST as possible  3) hand hygiene  4) diet, high in fruits and veggies

## 2006-09-11 NOTE — Progress Notes (Addendum)
Addended by: Harle Battiest on: 09/11/2006 8:21:52 PM     Modules accepted: Orders

## 2006-09-12 ENCOUNTER — Ambulatory Visit (HOSPITAL_BASED_OUTPATIENT_CLINIC_OR_DEPARTMENT_OTHER): Payer: PRIVATE HEALTH INSURANCE | Admitting: Gastroenterology

## 2006-09-12 LAB — URINE CULTURE/COLONY COUNT: URINE CULTURE/COLONY COUNT: NO GROWTH

## 2006-09-17 ENCOUNTER — Encounter (HOSPITAL_BASED_OUTPATIENT_CLINIC_OR_DEPARTMENT_OTHER): Payer: Self-pay

## 2006-09-26 ENCOUNTER — Ambulatory Visit (HOSPITAL_BASED_OUTPATIENT_CLINIC_OR_DEPARTMENT_OTHER): Payer: PRIVATE HEALTH INSURANCE

## 2006-11-13 ENCOUNTER — Encounter (HOSPITAL_BASED_OUTPATIENT_CLINIC_OR_DEPARTMENT_OTHER): Payer: Self-pay | Admitting: Internal Medicine

## 2006-11-13 ENCOUNTER — Ambulatory Visit (HOSPITAL_BASED_OUTPATIENT_CLINIC_OR_DEPARTMENT_OTHER): Payer: PRIVATE HEALTH INSURANCE | Admitting: Internal Medicine

## 2006-11-13 VITALS — BP 96/62 | HR 63 | Temp 98.3°F | Ht 62.01 in | Wt 152.5 lb

## 2006-11-13 DIAGNOSIS — M79606 Pain in leg, unspecified: Secondary | ICD-10-CM

## 2006-11-13 DIAGNOSIS — Z0000 Encounter for general adult medical examination without abnormal findings: Secondary | ICD-10-CM

## 2006-11-13 DIAGNOSIS — Z3009 Encounter for other general counseling and advice on contraception: Secondary | ICD-10-CM

## 2006-11-13 DIAGNOSIS — Z1159 Encounter for screening for other viral diseases: Secondary | ICD-10-CM

## 2006-11-13 DIAGNOSIS — K0889 Other specified disorders of teeth and supporting structures: Secondary | ICD-10-CM

## 2006-11-13 DIAGNOSIS — Z124 Encounter for screening for malignant neoplasm of cervix: Secondary | ICD-10-CM

## 2006-11-13 LAB — CHG SEDIMENTATION RATE RBC NON-AUTOMATED: RBC SEDIMENTATION RATE: 36 MM/HR — ABNORMAL HIGH (ref 0–15)

## 2006-11-13 LAB — FECAL OCCULT (POINT OF CARE) OFFICE/INPATIENT TEST 1 CARD
LOT #: 1170
OCCULT BLOOD: NEGATIVE

## 2006-11-13 MED ORDER — SRONYX 0.1-20 MG-MCG PO TABS
ORAL_TABLET | ORAL | Status: DC
Start: 2006-11-13 — End: 2007-07-19

## 2006-11-13 NOTE — Patient Instructions (Signed)
[facility]  [facility]  [address]  [place], [place] [address]  Clinical Nutrition Services    Calcium and Your Health    Calcium is a mineral that helps build strong bones and teeth, helps prevent brittle bones (osteoporosis) and hip fractures, and helps maintain a normal blood pressure. It is very important to get enough calcium each day.    * CALCIUM NEEDS AT ALL AGES: (RDI 2000)  Infants birth to 12 months  is 210 -270 mg/day          Child 1-8 years  is 500-.800 mg/day                     Adolescent 9-18years is 1300 mg/day                   Adults 19-30 years is 1000 mg/day  Adults 31-50 years is 1000 mg/day  Adults 51 and older is 1200 mg/day    * TO MAINTAIN GOOD BONE HEALTH, EAT A CALCIUM RICH DIET:    FOODS                                      Yogurt, plain, nonfat: 1 cup serving is 450 mgs of calcium    Ricotta cheese, part skim: 1/2 cup serving is 340 mgs of calcium    Milk, skim: 1 cup serving is 300 mgs of calcium    Milk, whole: 1 cup serving is 290 mgs of calcium    Sardines, canned, with bones: 3 oz serving is 280 mgs of calcium    Yogurt, plain, whole milk:1 cup serving is 275 mgs of calcium    Orange juice, calcium fortified:1 cup serving is 270 mgs of calcium    Swiss cheese: 1 oz serving is 270 mgs of calcium    Spinach, cooked: 1 cup serving is 240 mgs of calcium   Turnip greens, rhubarb, cooked:1 cup serving is 200 mgs of calcium   Salmon, canned, with bones: 3 oz serving is 180 mgs of calcium   Ice Cream: 1 cup serving is 175 mgs of calcium   Pudding, instant mix: 1/2 cup serving is 150 mgs of calcium   Almonds:  /2 cup serving is 150 mgs of calcium   Kale, cooked: 1 cup serving is 100 mgs of calcium   Lobster, cooked: 6 oz serving is 100 mgs of calcium   Tofu, with calcium sulfate:  3oz serving is 100 mgs of calcium     EXERCISE: Weight bearing exercises, such as walking, jogging, racquet sports, aerobics, and weight training  help make bones stronger.     VITAMIN D:  Vitamin D is essential to help your body absorb calcium. A healthy body can make its own vitamin D with the help of sunlight. But in the winter months, you may not get enough sun, and should make   sure you get it from another source, like milk or a multivitamin.     HORMONES: The hormone estrogen helps the female body and bones to use calcium. After menopause women need to discuss hormone treatment with their doctor.    * This is just a first step in helping improve your health.  * For help with a meal plan for you, make an appointment with the Nutritionist.  * For more information, you can also contact the National Nutrition Hotline   1-800-366-1655                                                              9/95 MAS 1/96, 12/97eqj, 01/02, 4/02

## 2006-11-13 NOTE — Progress Notes (Addendum)
Chief Complaint:  Le Ferraz is a 41 year old female who presents for a physical exam and c/o mxl pain. Patient notes intermittent global leg pain with accompanying subjective weakness, occurring on average every few weeks, lasting a few days when it occurs, x 4 months or so. No prior episodes. Denies any accompanying weight loss, sweats, falls, trauma, or change in bowel or bladder habits. No med therapy attempted. Notes last episode started 11/12/06 and now resolving.    Notes previous dental bone infection at maxilla with TTP at jaw below L nostril. Concerned about possible continued infection. Denies fever.    Patient Active Problem List:   CONTRACEPTIVE MANGMT NEC [V25.09]   LUMP OR MASS IN BREAST [611.72]   HYPERLIPIDEMIA NEC/NOS [272.4]   ESOPHAGEAL REFLUX [530.81]   GASTRITIS NEC W/O HEMORRH [535.40]   ABDOMINAL PAIN EPIGASTRIC [789.06]   ANGIONEUROTIC EDEMA [995.1]   Plantar Fasciitis, L [728.22F]      Current outpatient prescriptions:  OTHER MEDICATION, [facility] OCP- "nordette 1 tab pO QD" since 3/08, Disp: 0, Rfl: 0  PROTONIX 40 MG OR TBEC, 1 tablet morning and 1 tablet evening, Disp: 30, Rfl: 11      Allergies:  Review of Patient's Allergies indicates:   Ibuprofen Swelling   Nsaids Rash, Swelling   Comment: Labial edema with alleve, rash as well   Pollen extract/tree* Runny Nose   Comment: HA, itchy watery eyes    Health Maintenance:  PHYSICAL EXAM (AGE 56-49) due on 11/11/2006   PAP SMEAR due on 12/13/2006   MAMMOGRAPHY( YEARLY) due on 05/22/2007   TETANUS (16 AND OVER) due on 11/12/2009     Immunizations:    Immunization History   FLU VACCINE,3 YEARS AND ABOVE 0.50 ML   02/18/2004 02/13/2005     Histories:  Past Medical History:   Irregular Menstrual Cycle    Pregnant State, Incidental    Comment: c/s breech  Past Surgical History:   CESAREAN DELIVERY ONLY    Comment: breech   BREAST BIOPSY SPECIMEN    Comment: s/p fibroadenoma on the L,   Social History   Marital Status: Single Spouse Name:     Years of Education: Number of children:     Occupational History   None on file    Social History Main Topics   Tobacco Use: Quit Packs/Day: 0.5 Years: 37    Quit date: 06/28/2003   Comment: stopped 2005 Had smoked for 27 years!   Alcohol Use: No    Drug Use: No    Sexual Activity: Not Currently Partners with: Female   Birth Control/Protection: Pill   Comment: no STI hx; HIV neg 2002    Other Topics Concern  STRESS CONCERN Yes   Comment: rev  WEIGHT CONCERN Yes   Comment: rev  DIET Yes   Comment: rev  SEAT BELT Yes   Comment: rev  SELF EXAMS Yes   Comment: rev    Social History Narrative   Works as a Financial trader. Married.      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       Review of patient's family history indicates:   Hypertension Mother    Hypertension Father    Diabetes Mother    Diabetes Maternal Uncle    Diabetes Maternal Uncle    Cancer - Other FamHxNeg    Cancer - Breast FamHxNeg    Cancer - Colon FamHxNeg    Cancer - Lung FamHxNeg    Cancer - Ovarian FamHxNeg    Heart  Maternal Grandmother    Heart Maternal Uncle    Heart Maternal Uncle       Review of Systems:   Skin: negative  Eyes: glasses or contacts  Ears/Nose/Throat: negative; see HPI re mouth  Respiratory: when has leg pain/weakness  Cardiovascular: negative  Gastrointestinal: stable on PPI  Genitourinary: negative  Gyn:  normal menses, no abnormal bleeding, pelvic pain or discharge  no breast pain or new or enlarging lumps on self exam  Musculoskeletal: see HPI  Neurologic: negative  Endocrine: negative  Psychiatric: negative  Hematologic/Lymphatic/Immunologic: negative    Physical:  BP 96/62   Pulse 63   Temp (Src) 98.3 F (36.8 C) (Temporal)   Ht 5' 2.01" (1.575 m)   Wt 152 lb 8 oz (69.174 kg)   SpO2 99%   LMP 10/17/2006  General appearance: healthy, alert, well developed, well nourished  Eyes: conjunctivae/corneas clear. PERRL, EOM's intact. Fundi with questionable dilated vessels  Skin: skin color, texture, turgor are normal  Head: Normocephalic. No masses, lesions,  tenderness or abnormalities  Ears: External ears normal. Canals clear. TM's normal.  Nose/Sinuses: Nares normal. Septum midline. Mucosa normal. No drainage or sinus tenderness.  Oropharynx: Lips, mucosa, and tongue normal. Teeth and gums normal. Oropharynx moist and without lesion  Neck: Neck supple. No adenopathy. Thyroid symmetric, normal size, and without nodularity  Back: Back symmetric, no curvature. ROM normal. No CVA tenderness.  Lungs: Percussion normal. Good diaphragmatic excursion. Lungs clear to auscultation bilaterally  Heart: PMI normal. No lifts, heaves, or thrills. RRR. No murmurs, clicks, gallops or rubs  Breast: breasts symmetric, no dominant or suspicious mass, no skin or nipple changes, no axillary adenopathy and self exam in taught and encouraged  Abdomen: Abdomen soft, non-tender. BS normal. No masses, no organomegaly  Extremities: Extremities normal. No deformities, edema, or skin discoloration  Musculoskeletal: Spine ROM normal. Muscular strength intact.  Peripheral pulses: radial=4/4, dorsalis pedis=4/4  Neuro: Gait normal. Reflexes normal and symmetric. Sensation grossly normal   Pelvic: External genitalia and vagina normal. Bimanual and rectovaginal exam normal  Rectal: negative, no tenderness noted, QCOK neg hemoccult 1170 6 r 4/10    Health Counseling:  Smoking: Reviewed and Discussed  Substance Use Issues: Reviewed and Discussed   Seat Belts: Reviewed and Discussed  Smoke Dectectors: Reviewed and Discussed  Diet, Exercise, Wt. Control: Reviewed and Discussed  Dental Health: Reviewed and Discussed  Vision Health: Reviewed and Discussed  Mental Health: Reviewed and Discussed  Domestic Violence: Reviewed and Discussed  Sexual Health: Reviewed and Discussed      ASSESSMENT/PLAN:  V70.0 Routine General Medical Examination at a Health Care Facility (primary encounter diagnosis)  Comment: well female  Plan: next CPE >11/13/07    V25.09H General Counseling and Advice for Contraceptive  Management  Comment: previously on same medication without problem  Plan: SRONYX 0.1-20 MG-MCG OR TABS    V73.89 Special Screening Examination for Other Specified Viral Diseases  Comment: asx, neg pap  Plan: HUMAN PAPILLOMAVIRUS       V76.2 Screening for Malignant Neoplasm of the Cervix  Comment: asx, nml pap in past  Plan: AMPLIFIED GENPROBE CHLAM/GC, PAP SMEAR THIN    PREP       729.5H Leg Pain  Comment: unclear etiology with concern for systemic inflammation with nml TSH vs. Primary mxl d/o less likely vs. Vasculitis vs. AI dx vs. Other systemic illness   Plan: ROUTINE VENIPUNCTURE, ALDOLASE, ASSAY OF    FERRITIN, RBC SED RATE, NONAUTOMATED, C    REACTIVE  PROTEIN, ANTINUCLEAR ANTIBODY; CXR to evaluate for hilar LAD       525.8F Dental Disorder NEC  Comment: unclear etiology with concern for chronic bone infection  Plan: REFERRAL TO DENTAL (INT)       I have reviewed the past medical, surgical, social and family history and updated these sections of EpicCare as relevant. All interim labs, test results, and consult notes were reviewed and discussed with Jerilynn Mages. Medications were reconciled during this visit and a current medication list was given to the patient at the end of the visit.      Spent >50% of >36mins visit counseling re possible etiologies of current weakness/fatigue, need for dental evaluation

## 2006-11-14 ENCOUNTER — Encounter (HOSPITAL_BASED_OUTPATIENT_CLINIC_OR_DEPARTMENT_OTHER): Payer: Self-pay | Admitting: Internal Medicine

## 2006-11-14 DIAGNOSIS — M79606 Pain in leg, unspecified: Secondary | ICD-10-CM | POA: Insufficient documentation

## 2006-11-14 HISTORY — DX: Pain in leg, unspecified: M79.606

## 2006-11-14 LAB — CHLAMYDIA GC NAAT
GENPROBE CHLAMYDIA: NEGATIVE
GENPROBE GC: NEGATIVE

## 2006-11-15 LAB — ALDOLASE: ALDOLASE: 4.5 U/L (ref 1.2–7.6)

## 2006-11-15 LAB — C-REACTIVE PROTEIN: C-REACTIVE PROTEIN: 0.8 mg/dL (ref 0–1.8)

## 2006-11-15 LAB — ANTINUCLEAR ANTIBODY: ANTINUCLEAR ANTIBODY: NEGATIVE

## 2006-11-15 LAB — CHG ASSAY OF FERRITIN: FERRITIN: 139 ng/mL (ref 11–307)

## 2006-11-20 ENCOUNTER — Ambulatory Visit (HOSPITAL_BASED_OUTPATIENT_CLINIC_OR_DEPARTMENT_OTHER): Payer: Self-pay | Admitting: Internal Medicine

## 2006-11-20 LAB — HUMAN PAPILLOMAVIRUS (HPV): HUMAN PAPILLOMAVIRUS: NEGATIVE

## 2006-11-20 LAB — XR CHEST 2 VIEWS

## 2006-11-22 ENCOUNTER — Telehealth (HOSPITAL_BASED_OUTPATIENT_CLINIC_OR_DEPARTMENT_OTHER): Payer: Self-pay | Admitting: Internal Medicine

## 2006-11-22 NOTE — Telephone Encounter (Signed)
Staff Message copied by Gwenith Spitz on Thu Nov 22, 2006 5:28 PM  ------   Message from: Dineen Kid   Created: Thu Nov 22, 2006 9:54 AM    Called req. Lab results, please advise. Has a F\U in 9\9,but doesn't want to wait for results. Darel Hong

## 2006-11-22 NOTE — Telephone Encounter (Signed)
Chart reviewed. No lab letter sent to patient as not all resulted (pap smear).    Nml CXR result sent to pt by mail     Ferritin - normal  ESR - mildly elevated (non-specific)  CRP - nml  ANA - negative  Chlamydia/GC - negative  Aldolase - negative    Will send full lab letter when pap resulted.    Thanks,    Rob

## 2006-11-26 LAB — CYTOPATH, C/V, THIN LAYER

## 2006-11-30 NOTE — Telephone Encounter (Signed)
Staff Message copied by Alvino Blood on Fri Nov 30, 2006 10:33 AM  ------   Message from: Thea Silversmith   Created: Wed Nov 21, 2006 2:50 PM   Regarding: test results    Deborah [name] [id number], 41 year old, female, Telephone Information:  Home Phone [phone]  Work Phone [phone]      [name] NUMBER: [phone]      Available times:all day    Patient's language of care: [place]      Patient's PCP: Gwenith Spitz, MD    Person calling on behalf of patient: self    Calls today: pt had exam done @ [facility] and she would like to have her test results    Patient's Preferred Pharmacy:   No Pharmacies Listed

## 2006-11-30 NOTE — Telephone Encounter (Signed)
TC   NO answer. LVM to let pt knoe MD sent out a letter with reults

## 2006-12-18 ENCOUNTER — Ambulatory Visit (HOSPITAL_BASED_OUTPATIENT_CLINIC_OR_DEPARTMENT_OTHER): Payer: PRIVATE HEALTH INSURANCE | Admitting: Internal Medicine

## 2007-07-19 ENCOUNTER — Ambulatory Visit (HOSPITAL_BASED_OUTPATIENT_CLINIC_OR_DEPARTMENT_OTHER): Payer: PRIVATE HEALTH INSURANCE | Admitting: Internal Medicine

## 2007-07-19 ENCOUNTER — Encounter (HOSPITAL_BASED_OUTPATIENT_CLINIC_OR_DEPARTMENT_OTHER): Payer: Self-pay | Admitting: Internal Medicine

## 2007-07-19 VITALS — BP 102/60 | HR 84 | Temp 98.6°F | Wt 150.0 lb

## 2007-07-19 DIAGNOSIS — K219 Gastro-esophageal reflux disease without esophagitis: Secondary | ICD-10-CM

## 2007-07-19 DIAGNOSIS — R1013 Epigastric pain: Secondary | ICD-10-CM

## 2007-07-19 DIAGNOSIS — K2960 Other gastritis without bleeding: Secondary | ICD-10-CM

## 2007-07-19 DIAGNOSIS — M25561 Pain in right knee: Secondary | ICD-10-CM

## 2007-07-19 DIAGNOSIS — F17201 Nicotine dependence, unspecified, in remission: Secondary | ICD-10-CM

## 2007-07-19 DIAGNOSIS — N951 Menopausal and female climacteric states: Secondary | ICD-10-CM

## 2007-07-19 NOTE — Progress Notes (Signed)
Deborah [name] is a 42 year old female    Review of Patient's Allergies indicates:   Ibuprofen               Swelling   Nsaids                  Rash, Swelling    Comment: Labial edema with alleve, rash as well   Pollen extract/tree*    Runny Nose    Comment: HA, itchy watery eyes      No current outpatient prescriptions on file.  Patient Active Problem List:     CONTRACEPTIVE MANGMT NEC [V25.09]     LUMP OR MASS IN BREAST [611.72]     HYPERLIPIDEMIA NEC/NOS [272.4]     ESOPHAGEAL REFLUX [530.81]     GASTRITIS NEC W/O HEMORRH [535.40]     ABDOMINAL PAIN EPIGASTRIC [789.06]     ANGIONEUROTIC EDEMA [995.1]     Plantar Fasciitis, L [728.53F]     Leg Pain [729.5H]     Tobacco Abuse, in Remission [305.1AH]      Social History   Marital Status: Single  Spouse Name: N/A    Years of Education: N/A  Number of Children: 1     Occupational History  housecleaning       Social History Main Topics   Tobacco Use: Quit  0.5 Packs/Day  For 27 Years     Quit date:  06/28/2003      Alcohol Use: No    Drug Use: No    Sexually Active: Not Currently  Partner(s): Female    Birth Control/ Protection: Pill      Comment: no STI hx; HIV neg 2002       Other Topics Concern    STRESS CONCERN Yes    Comment: rev    WEIGHT CONCERN Yes    Comment: rev    DIET Yes    Comment: rev    SEAT BELT Yes    Comment: rev    SELF EXAMS Yes    Comment: rev     Social History Narrative    Deborah [name], [place]. To [place] 2000.    Lives with her son and her parents. Divorced from husband.    Denies DV. No guns in home.     Pt here with multiple concerns.    Notes that she has not menstruated x 3 months, with no interval bleeding, and has been experiencing cold drenching sweats at night, with a sensation of extreme heat. Hot flushes associated with transient episodes of feeling as if she is going to faint, but without presyncopal or syncopal episodes.    Epigastric pain resolved after divorce finalized, with pt not using and not requiring any med therapy for  GERD.    Notes x 15 days or recurrent R knee pain, exacerbated by descending stairs, rising up from chair, at inframedial region, as well as post knee radiating up post thigh. No prior trauma. No swelling. Tylenol OTC attempted with some relief at night.    Filed Vitals:    07/19/2007   2:40 PM   BP: 102/60   Pulse: 84   Temp: 98.6 F (37 C)   TempSrc: Temporal   Weight: 150 lb (68.04 kg)       PE: WN WD in NAD  HEENT - NCAT EOMI  Ext - R knee: no TTP at joint lines lat/med, no TTP post, no edema, no skin changes, no crepitus, decreased ROM due to pain at  infralat region  Neuro - A&O x 3    719.46X Right Knee Pain  (primary encounter diagnosis)  Comment: cocnern for internal derangement vs. DJD  Plan: REFERRAL TO ORTHOPEDICS (INT)        Tylenol OTC only    305.1AH Tobacco Abuse, in Remission  Comment: x yrs  Plan: counseled for cont cessation    535.40 GASTRITIS NEC W/O HEMORRH  Comment: sxs resolved  Plan: advised to RTC for recurrence    789.06 ABDOMINAL PAIN EPIGASTRIC  Comment: as above  Plan: as above    530.81 Esophageal Reflux  Comment: as above  Plan: as above    627.2AK Menopausal Syndrome (Hot Flushes)  Comment: relatively young age, likely vs. Ovarian failure  Plan: REFERRAL TO GYNECOLOGY (INT)          I have reviewed the past medical, surgical, social and family history and updated these sections of EpicCare as relevant. All interim labs, test results, and consult notes were reviewed and discussed with Jerilynn Mages. Medications were reconciled during this visit and a current medication list was given to the patient at the end of the visit.    F/u c PCP prn

## 2007-07-24 ENCOUNTER — Ambulatory Visit (HOSPITAL_BASED_OUTPATIENT_CLINIC_OR_DEPARTMENT_OTHER): Payer: Self-pay | Admitting: Internal Medicine

## 2007-08-08 ENCOUNTER — Ambulatory Visit (HOSPITAL_BASED_OUTPATIENT_CLINIC_OR_DEPARTMENT_OTHER): Payer: PRIVATE HEALTH INSURANCE | Admitting: Orthopaedic Surgery

## 2007-08-08 ENCOUNTER — Encounter (HOSPITAL_BASED_OUTPATIENT_CLINIC_OR_DEPARTMENT_OTHER): Payer: Self-pay | Admitting: Internal Medicine

## 2007-08-08 DIAGNOSIS — M2240 Chondromalacia patellae, unspecified knee: Secondary | ICD-10-CM | POA: Insufficient documentation

## 2007-08-08 DIAGNOSIS — M545 Low back pain, unspecified: Secondary | ICD-10-CM | POA: Insufficient documentation

## 2007-08-08 DIAGNOSIS — M6530 Trigger finger, unspecified finger: Secondary | ICD-10-CM | POA: Insufficient documentation

## 2007-08-08 DIAGNOSIS — M25511 Pain in right shoulder: Secondary | ICD-10-CM | POA: Insufficient documentation

## 2007-08-08 HISTORY — DX: Pain in right shoulder: M25.511

## 2007-08-08 HISTORY — DX: Trigger finger, unspecified finger: M65.30

## 2007-08-09 LAB — XR KNEE RIGHT MINIMUM 4 VIEWS

## 2007-08-09 LAB — ORTHOPEDIC OFFICE NOTE

## 2007-08-09 LAB — XR KNEES STANDING AP ONLY BILATERAL

## 2007-08-13 LAB — MA SCREENING MAMMO BILATERAL WITH CAD

## 2007-09-17 ENCOUNTER — Ambulatory Visit (HOSPITAL_BASED_OUTPATIENT_CLINIC_OR_DEPARTMENT_OTHER): Payer: Self-pay | Admitting: Hand Surgery

## 2007-10-22 ENCOUNTER — Ambulatory Visit (HOSPITAL_BASED_OUTPATIENT_CLINIC_OR_DEPARTMENT_OTHER): Payer: Self-pay | Admitting: Physical Medicine & Rehabilitation

## 2007-10-23 ENCOUNTER — Encounter (HOSPITAL_BASED_OUTPATIENT_CLINIC_OR_DEPARTMENT_OTHER): Payer: Self-pay | Admitting: Internal Medicine

## 2007-10-23 ENCOUNTER — Ambulatory Visit (HOSPITAL_BASED_OUTPATIENT_CLINIC_OR_DEPARTMENT_OTHER): Payer: PRIVATE HEALTH INSURANCE | Admitting: Physical Medicine & Rehabilitation

## 2007-10-23 LAB — BLOOD COUNT COMPLETE AUTO&AUTO DIFRNTL WBC
BASOPHIL %: 0.3 % (ref 0.0–2.0)
EOSINOPHIL %: 1.3 % (ref 0.0–7.0)
HEMATOCRIT: 39.6 % (ref 36.0–48.0)
HEMOGLOBIN: 13.6 g/dl (ref 12.0–16.0)
LYMPHOCYTE %: 34.6 % (ref 13.0–39.0)
MEAN CORP HGB CONC: 34.3 g/dl (ref 32.0–36.0)
MEAN CORPUSCULAR HGB: 30.5 pg (ref 27.0–33.0)
MEAN CORPUSCULAR VOL: 88.9 fl (ref 80.0–100.0)
MEAN PLATELET VOLUME: 8.5 fl (ref 6.4–10.8)
MONOCYTE %: 10 % (ref 1.0–12.0)
NEUTROPHIL %: 53.8 % (ref 46.0–79.0)
PLATELET COUNT: 232 10*3/uL (ref 150–400)
RBC DISTRIBUTION WIDTH: 11.5 % (ref 11.5–14.3)
RED BLOOD CELL COUNT: 4.45 M/uL — ABNORMAL LOW (ref 4.50–5.10)
WHITE BLOOD CELL COUNT: 8.9 10*3/uL (ref 4.0–10.8)

## 2007-10-23 LAB — XR LUMBAR SPINE WITH OBLIQUES, FLEXION & EXTENSION

## 2007-10-23 LAB — XR SHOULDER RIGHT MINIMUM 2 VIEWS

## 2007-10-23 NOTE — Progress Notes (Signed)
Abstraction of email from physiatry provider.    Apparently, pt was prescribed Medrol dosepak previously by ortho in 4/09 and pharmacy was continuing to give refills. Physiatry provider canceled the rx.

## 2007-10-24 ENCOUNTER — Ambulatory Visit (HOSPITAL_BASED_OUTPATIENT_CLINIC_OR_DEPARTMENT_OTHER): Payer: PRIVATE HEALTH INSURANCE

## 2007-10-24 ENCOUNTER — Telehealth (HOSPITAL_BASED_OUTPATIENT_CLINIC_OR_DEPARTMENT_OTHER): Payer: Self-pay | Admitting: Internal Medicine

## 2007-10-24 VITALS — BP 92/70 | Wt 156.0 lb

## 2007-10-24 DIAGNOSIS — N912 Amenorrhea, unspecified: Secondary | ICD-10-CM

## 2007-10-24 LAB — TSH (THYROID STIMULATING HORMONE): TSH (THYROID STIM HORMONE): 0.62 u[IU]/mL (ref 0.34–5.60)

## 2007-10-24 LAB — URINE PREGNANCY TEST (POINT OF CARE): HCG QUALITATIVE URINE: NEGATIVE

## 2007-10-24 LAB — CHG GONADOTROPIN FOLLICLE STIMULATING HORMONE: FOLLICLE STIMULATING HORMONE: 80.18 m[IU]/mL

## 2007-10-24 NOTE — Telephone Encounter (Signed)
Pt returned call to phone staff. Appt booked for 10/29/07 w/NP.

## 2007-10-24 NOTE — Telephone Encounter (Signed)
Pt seen by physiatry 10/23/07 with provider noting that pt had been on Medrol dosepak for approximately 3 months in error (should not have been refilled).    At physiatrist's request, pls contact pt to schedule an appt next week with PCP or NP (if PCP has no openings) to check for any SEs. Pls confirm with pt that she has not experienced any change in mental status or infections.    Thanks,    Rob

## 2007-10-24 NOTE — Telephone Encounter (Signed)
TC to pt.  LM on VM to return [facility] RN to book f/u appt w/PCP/covering provider per physiatrist's request.

## 2007-10-24 NOTE — Progress Notes (Signed)
42 yo G1P1001  Referred for  secondary amenorrhea      ObHx: C/S X 1    GynHx:  Pt was on OC's for 10 years with reg menses; she had peiods where she would go off and menses were regular or twice monthly  Now with 7 months with no menses  SA  Last SA 1 year ago  No FP  ++ hot flashes  Diff sleeping secondary to hot flashes    ROS: normal BM; no dry skin; did have hair loss when period stopped but no longer; pt feels fatigued      Patient Active Problem List:     CONTRACEPTIVE MANGMT NEC [V25.09]     LUMP OR MASS IN BREAST [611.72]     HYPERLIPIDEMIA NEC/NOS [272.4]     ESOPHAGEAL REFLUX [530.81]     GASTRITIS NEC W/O HEMORRH [535.40]     ABDOMINAL PAIN EPIGASTRIC [789.06]     ANGIONEUROTIC EDEMA [995.1]     Plantar Fasciitis, L [728.49F]     Leg Pain [729.5H]     Tobacco Abuse, in Remission [305.1AH]     Triggering of Finger [727.03G]     Right Shoulder Pain [719.41X]     Chondromalacia Patellae [717.7A]     Lower Back Pain [724.2U]    Reviewed pathophys of secondary amenorrhea    A/P  1)amenorrhea: TSH/FSH                            RTC for results and plan/mgmt of vasomotor sx's    > 50% of 30 min visit spent counseling

## 2007-10-25 LAB — BASIC METABOLIC PANEL
ANION GAP: 14 mmol/L (ref 2–25)
BUN (UREA NITROGEN): 16 mg/dl (ref 6–20)
CALCIUM: 9.7 mg/dl (ref 8.6–10.3)
CARBON DIOXIDE: 23 mmol/L (ref 22–32)
CHLORIDE: 105 mmol/L (ref 101–111)
CREATININE: 0.7 mg/dl (ref 0.4–1.2)
ESTIMATED GLOMERULAR FILT RATE: >60 mL/min (ref 60–116)
Glucose Random: 83 mg/dl (ref 74–160)
POTASSIUM: 4.4 mmol/L (ref 3.5–5.1)
SODIUM: 142 mmol/L (ref 135–144)

## 2007-10-25 LAB — VITAMIN D,25 HYDROXY: VITAMIN D,25 HYDROXY: 25.9 ng/ml — ABNORMAL LOW (ref 30.0–100.0)

## 2007-10-25 LAB — CHG RHEUMATOID FACTOR QUALITATIVE: RHEUMATOID FACTOR: 12.4 IU/mL (ref 0.0–13.9)

## 2007-10-28 LAB — CYCLIC CITRULLINATED PEPT AB: CYCLIC CITRULLINATED PEPTIDE IGG ANTIBODY: 1 U/mL (ref 0–5)

## 2007-10-29 ENCOUNTER — Ambulatory Visit (HOSPITAL_BASED_OUTPATIENT_CLINIC_OR_DEPARTMENT_OTHER): Payer: PRIVATE HEALTH INSURANCE

## 2007-10-29 VITALS — BP 110/70 | HR 89 | Temp 97.7°F | Wt 160.0 lb

## 2007-10-29 DIAGNOSIS — M79606 Pain in leg, unspecified: Secondary | ICD-10-CM

## 2007-10-29 DIAGNOSIS — M545 Low back pain, unspecified: Secondary | ICD-10-CM

## 2007-10-29 DIAGNOSIS — T887XXA Unspecified adverse effect of drug or medicament, initial encounter: Secondary | ICD-10-CM

## 2007-10-29 NOTE — Progress Notes (Signed)
Has questions about orthopedic Dr. Boneta Lucks and medication that was Rx ,  She is on her 6th medrol dose pack. After the apt with Dr. Danelle Earthly she stopped the medicine.( the note states she was to continue that week)  chief complaint  She was asked to come to review medications  Patient Active Problem List:     CONTRACEPTIVE MANGMT NEC [V25.09]     LUMP OR MASS IN BREAST [611.72]     HYPERLIPIDEMIA NEC/NOS [272.4]     ESOPHAGEAL REFLUX [530.81]     GASTRITIS NEC W/O HEMORRH [535.40]     ABDOMINAL PAIN EPIGASTRIC [789.06]     ANGIONEUROTIC EDEMA [995.1]     Plantar Fasciitis, L [728.71F]     Leg Pain [729.5H]     Tobacco Abuse, in Remission [305.1AH]     Triggering of Finger [727.03G]     Right Shoulder Pain [719.41X]     Chondromalacia Patellae [717.7A]     Lower Back Pain [724.2U]      Current outpatient prescriptions prior to encounter:  OTHER MEDICATION prednisone Disp:  Rfl:        SUBJECTIVE:  Deborah [name] is here as her primary care requested her to come about her mediations. She has been taking medrol pack, she was on the 6th pack last week when she saw Dr. Danelle Earthly who told her to stop after last week and she has done so. She is tired, she describes this as  her mind wants to do things but her body is too tired to do them. She also notes she feels swollen and like her breasts are full  Her joints feel swollen, she points to her hands. She has the pain in the back and the knee,although the knee is  Better than it was. She has had no fever, n, vomiting, b and b are normal, no sob, palpitations  Her stomach is bothering her some with the medication that Dr. Danelle Earthly gave her (diclofenac 75 mg 1 tablet twice )   OBJECTIVE:  BP 110/70   Pulse 89   Temp (Src) 97.7 F (36.5 C) (Temporal)   Wt 160 lb (72.576 kg)   SpO2 99%   LMP Unsure of Dates  10 lbs weight gain since April  She is in distress, gait nl and she is able to climb on the exam table without a problem  Skin is warm and dry  HEENT is neg  Neck is supple  Chest is clear  RRR  without murmur  Abd exam neg  There is no visible swelling in joints, no tenderness or warmth in fingers, wrists, ankles  ASSESSMENT:  995.20B Medication Side Effects  (primary encounter diagnosis)  Comment:  There are no acute side effect noted of the longer than planned prednisone treatment  Plan: discussed with Dr. Maggie Schwalbe  She will keep f/u with Dr. Danelle Earthly  She will go to PT  Encouraged to call heath center for any worrisome symptoms, to ask to speak to nurse if there are no appointment or if she has any questions or concerns. She may also leave a message for Dr. Maggie Schwalbe or myself.

## 2007-11-01 ENCOUNTER — Other Ambulatory Visit (HOSPITAL_BASED_OUTPATIENT_CLINIC_OR_DEPARTMENT_OTHER): Payer: Self-pay

## 2007-11-01 DIAGNOSIS — K219 Gastro-esophageal reflux disease without esophagitis: Secondary | ICD-10-CM

## 2007-11-01 NOTE — Telephone Encounter (Signed)
Chart reviewed. Rx declined initially, requiring more information from pt, urgently.    PPI was previously stopped by GI provider as previous pain evaluated as not c/w gastritis/ulcer, etc...    However, pt canceled f/u GI appt and then [facility] a further GI appt.    Concern for GI ulceration HIGH given recent extended erroneous use of medrol dosepak. Pt to clarify current pain level, location, associated sxs ( N/V/D, blood in stool, etc...).    Placing referral back to GI and pt encouraged to keep this appt (to call [facility] to schedule appt).    Once pt provides above information, PCP will determine therapy for current sxs.

## 2007-11-01 NOTE — Telephone Encounter (Signed)
Staff Message copied by Alvino Blood on Fri Nov 01, 2007 10:01 AM  ------   Message from: Larena Glassman   Created: Fri Nov 01, 2007 9:23 AM   Regarding: Stomach pain   Contact: [phone]    Deborah [name] [id number], 42 year old, female, Telephone Information:  Home Phone [phone]  Work Phone [phone]      [name] NUMBER: [phone]  Best time to call back:   Cell phone:   Other phone:    Available times:    Patient's language of care: [place]    Patient does not need an interpreter.    Patient's PCP: Gwenith Spitz, MD    Person calling on behalf of patient: Patient (self)    Calls today   Stomach pain,wants to know if pcp can give her a rx for Protonix?  Has taken medication before.    Patient's Preferred Pharmacy:   [facility]  Phone: [phone] Fax: [phone]

## 2007-11-01 NOTE — Telephone Encounter (Signed)
Pt is calling c/o stomachache, out of PROTONIX forgeot to ask NP to given RX  Pt is asking to get refill today. She uses [facility]

## 2007-11-12 LAB — ORTHOPEDIC OFFICE NOTE

## 2007-11-25 ENCOUNTER — Ambulatory Visit (HOSPITAL_BASED_OUTPATIENT_CLINIC_OR_DEPARTMENT_OTHER): Payer: Self-pay | Admitting: Physical Medicine & Rehabilitation

## 2007-12-18 ENCOUNTER — Ambulatory Visit (HOSPITAL_BASED_OUTPATIENT_CLINIC_OR_DEPARTMENT_OTHER): Payer: PRIVATE HEALTH INSURANCE

## 2007-12-18 VITALS — BP 100/76 | Wt 156.0 lb

## 2007-12-18 DIAGNOSIS — K219 Gastro-esophageal reflux disease without esophagitis: Secondary | ICD-10-CM

## 2007-12-18 DIAGNOSIS — N951 Menopausal and female climacteric states: Secondary | ICD-10-CM

## 2007-12-18 MED ORDER — PROTONIX 40 MG PO TBEC
DELAYED_RELEASE_TABLET | ORAL | Status: DC
Start: 2007-12-18 — End: 2008-06-30

## 2007-12-18 MED ORDER — CALCIUM CARBONATE-VIT D-MIN 600-400 MG-UNIT PO CHEW
CHEWABLE_TABLET | ORAL | Status: DC
Start: 2007-12-18 — End: 2009-05-19

## 2007-12-18 MED ORDER — PREMPRO 0.3-1.5 MG PO TABS
ORAL_TABLET | ORAL | Status: DC
Start: 2007-12-18 — End: 2008-06-30

## 2007-12-18 NOTE — Progress Notes (Signed)
Pt here for results  Reviewed labs c/w menopause  Pt reports that hot flashes were better this last month  Still no menses  Still with sleeping difficulty    Reviewed menopause and overall health consequences    A/P  1)Menopause: start HRT; B/SE/C reviewed                          RTC 3 months for f/u                          Calcium                           exercise

## 2008-02-10 ENCOUNTER — Ambulatory Visit (HOSPITAL_BASED_OUTPATIENT_CLINIC_OR_DEPARTMENT_OTHER): Payer: PRIVATE HEALTH INSURANCE | Admitting: Family Medicine

## 2008-02-10 VITALS — BP 90/70 | HR 80 | Temp 97.8°F | Wt 155.0 lb

## 2008-02-10 DIAGNOSIS — G5600 Carpal tunnel syndrome, unspecified upper limb: Secondary | ICD-10-CM

## 2008-02-10 NOTE — Progress Notes (Signed)
Pt of Dr Theophilus Kinds, here c/o b/l hand pain and numbness x approx several months, now worsening. Worse at noc, awakens  W/ numbness , esp in  First 2 fingers and thumb on both.  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 Feels hands are weak at noc.    Review of Patient's Allergies indicates:   Ibuprofen               Swelling   Nsaids                  Rash, Swelling    Comment: Labial edema with alleve, rash as well   Pollen extract/tree*    Runny Nose    Comment: HA, itchy watery eyes      Current outpatient prescriptions:  PROTONIX 40 MG OR TBEC 1 tablet morning and 1 tablet evening Disp: 30 Rfl: 0   PREMPRO 0.3-1.5 MG OR TABS one daily Disp: 100 Rfl: 6   CALCIUM CARBONATE-VIT D-MIN 600-400 MG-UNIT OR CHEW two tablets daily Disp: 60 Rfl: 11   TYLENOL EXTRA STRENGTH 500 MG OR TABS per ortho Disp:  Rfl: 0   OTHER MEDICATION prednisone Disp:  Rfl:        Patient Active Problem List:     CONTRACEPTIVE MANGMT NEC [V25.09]     LUMP OR MASS IN BREAST [611.72]     HYPERLIPIDEMIA NEC/NOS [272.4]     ESOPHAGEAL REFLUX [530.81]     GASTRITIS NEC W/O HEMORRH [535.40]     ABDOMINAL PAIN EPIGASTRIC [789.06]     ANGIONEUROTIC EDEMA [995.1]     Plantar Fasciitis, L [728.87F]     Leg Pain [729.5H]     Tobacco Abuse, in Remission [V15.82K]     Triggering of Finger [727.03G]     Right Shoulder Pain [719.41X]     Chondromalacia Patellae [717.7A]     Lower Back Pain [724.2U]    ZOX:WRUEA but well looking young woman, NAD  HEENT: Eyes clear, +PERRLA, EOMIS, thyroid gross nl  EXTREMS: Nl CSM ,-c/c/e, +2 distal pulses, +5/5 strength UEs, hand grasps, +Phelan's, neg Finkelstein's, neg Tinel's tests    A:B/L hand numbness  Likely CTS    P: Rest from rep mvmt, NSAIDs.Trial cocks up splint. Ibuprofen up to 800 mg po tid w / food prn pain  Protonix 40 mg po qd while on NSIADs, separate from other meds x 2-4 hrs  Med ed,se, worry sx, supp meas disc'd fully  RTC PRN concerns, unimproved, worse, worry sx (if hand weakness, inc pain, discoloration,  seek urg med attn advised) disc'd  Referral ortho hand specialist if no improvement or worse disc'd.  Pt stated she understood and agreed to do.

## 2008-02-21 ENCOUNTER — Ambulatory Visit (HOSPITAL_BASED_OUTPATIENT_CLINIC_OR_DEPARTMENT_OTHER): Payer: PRIVATE HEALTH INSURANCE | Admitting: Physician Assistant

## 2008-02-21 DIAGNOSIS — M654 Radial styloid tenosynovitis [de Quervain]: Secondary | ICD-10-CM

## 2008-02-21 DIAGNOSIS — M6530 Trigger finger, unspecified finger: Secondary | ICD-10-CM

## 2008-02-21 DIAGNOSIS — G5600 Carpal tunnel syndrome, unspecified upper limb: Secondary | ICD-10-CM

## 2008-02-21 LAB — XR HAND RIGHT MINIMUM 3 VIEWS

## 2008-02-21 MED ORDER — BUPIVACAINE HCL 0.5 % IJ SOLN
INTRAMUSCULAR | Status: AC
Start: 2008-02-21 — End: 2008-02-22

## 2008-02-21 MED ORDER — DEPO-MEDROL 20 MG/ML IJ SUSP
INTRAMUSCULAR | Status: AC
Start: 2008-02-21 — End: 2008-02-22

## 2008-02-21 NOTE — Progress Notes (Signed)
The primary progress note for this visit has been dictated through E-Scription. It can be viewed as an attachment to this encounter or through Chart Review under the Other Tab as an Orthopedic Office Note.      MT ACCT #:  [id number] PATIENT/PROCEDURE VERIFICATION DOCUMENTATION    Correct patient: Yes  Correct procedure: Yes  Correct site, mark visible if applicable: Yes    Pre-procedure Vital Signs:  BP: N/A  P: N/A  R: N/A    Risks and Benefits reviewed: Yes  Side: Right middle trigger  Correct position: Yes  Special equipment/implant(s) present, if applicable: Yes    Post-procedure Vitals Signs:  BP: N/A  P: N/A  R: N/A    MEDICATION DOSE VOLUME   Xylocaine 1% - epinephrine free ------------- ml   Bupivicaine 0.5% - epinephrine free ------------- 0.25 ml   Depo-medrol 20mg /ml mg 0.25 ml   Depo-medrol 40mg /ml mg ml   Depo-medrol 80mg /ml mg ml   Kenalog 40mg /ml mg ml   Dexamethasone 10mg /ml mg ml   Other:  mg ml       Time-out completed, documented by provider doing procedure or designated team member:  Harlon Flor    02/21/2008    3:38 PM

## 2008-02-22 ENCOUNTER — Encounter (HOSPITAL_BASED_OUTPATIENT_CLINIC_OR_DEPARTMENT_OTHER): Payer: Self-pay | Admitting: Internal Medicine

## 2008-02-22 DIAGNOSIS — M654 Radial styloid tenosynovitis [de Quervain]: Secondary | ICD-10-CM | POA: Insufficient documentation

## 2008-02-22 DIAGNOSIS — G5600 Carpal tunnel syndrome, unspecified upper limb: Secondary | ICD-10-CM

## 2008-02-22 HISTORY — DX: Carpal tunnel syndrome, unspecified upper limb: G56.00

## 2008-02-22 HISTORY — DX: Radial styloid tenosynovitis (de quervain): M65.4

## 2008-03-16 ENCOUNTER — Ambulatory Visit (HOSPITAL_BASED_OUTPATIENT_CLINIC_OR_DEPARTMENT_OTHER): Payer: PRIVATE HEALTH INSURANCE | Admitting: Rehabilitative and Restorative Service Providers"

## 2008-03-16 DIAGNOSIS — M654 Radial styloid tenosynovitis [de Quervain]: Secondary | ICD-10-CM

## 2008-03-16 NOTE — Progress Notes (Signed)
BASE EVALUATION - UPPER EXTREMITY    AGE: 42 year old  SEX: female  CHILDREN?: Yes     RELATIONSHIP: Divorced ETHNICITY: [place] INTERPRETER?:  Yes     LEISURE/HOBBIES: sing in church, fishing     DIAGNOSIS/REASON FOR REFERRAL: R Dequervains and B CTS     Hx OF PRESENT ILLNESS: 6 months ago onset of B hand numbness and R wrist pain. Patient reports 4 1/2 years ago, had cortisone injection for R dequervians with no problems until 6 months ago.    MEDICAL Hx: none   SURGICAL Hx: csection   SOCIAL HISTORY/HOME SITUATION: Patient lives w/ family  Works as Land   DOES PATIENT FEEL SAFE AT HOME: Yes LEARNS BEST: verbal   PAIN AT WORST: 7/10 PAIN AT BEST: 0/10 AVERAGE: 4/10   PAIN DESCRIPTION: Aching   LOCATION OF PAIN (Use Images activity for visual diagram):  R 1st dorsal compartment   SKIN INTEGRITY/COLOR: WNL   SUTURES: OTHER: none MEASUREMENTS: na   SENSATION: WNL? No  Patient c/o B hand numbness at night at times   DOMINANCE: right   EDEMA: none noted     Please Note: Only populated fields were assessed by provider, fields left blank were not assessed.    GIRTH RIGHT LEFT GIRTH RIGHT LEFT GIRTH RIGHT LEFT   WRIST   MP CIRCUMF.   ELBOWS       SPECIAL TEST RIGHT LEFT SPECIAL TEST RIGHT LEFT SPECIAL TEST RIGHT LEFT   FINKELSTEINS neg  PHALENS + + LONG FINGER     TINELS TEST             STRENGTH AND ROM:  RIGHT RIGHT LEFT LEFT COMMENTS    ROM MMT ROM MMT    SHOULD FLEX 0-180     BUE AROM is WNL    EXT 0-50         ABDUCTION 0-160         INTERNAL ROTATION 0-70         EXTERNAL ROTATION 0-90        ELBOW FLEX/EXT 0-145        FOREAR PRONATION 0-85         SUPINATION 0-85        WRIST FLEX 0-70         EXT 0-70         ULNAR DEVIATION 0-30         RADIAL DEVIATION 0-20          ROM:  RIGHT RIGHT LEFT LEFT COMMENTS    ROM MMT ROM MMT    THUMB CM FLEX 0-15     B UE AROM is WNL but pain in 1st dorsal compartment on R with thumb extension    CM EXT 0-20         MP FLEX/EXT 0-50         IP FLEX/EXT 0-80          ABDUCTION 0-70         OPPOSITION (CM)        D2 MP FLEX 0-90         PIP FLEX/EXT 0-100         DIP FLEX/EXT 0-90        D3 MP FLEX 0-90         PIP FLEX/EXT 0-100         DIP FLEX/EXT 0-90        D4 MP FLEX 0-90  PIP FLEX/EXT 0-100         DIP FLEX/EXT 0-90        D5 MP FLEX 0-90         PIP FLEX/EXT 0-100         DIP FLEX/EXT 0-90        GRIP GRIP 50 lbs  45 lbs     PINCH LATERAL 6  10      TRIPOD 5  12      TIP 5  9                FUNCTIONAL STATUS  COMMENTS   Difficulty with all ADLs when numbness present     FEEDING INDEPENDENT Difficulty  With opening jars   PERSONAL CARE INDEPENDENT    DRESSING INDEPENDENT    LIFTING INDEPENDENT    HOUSEHOLD CHORES INDEPENDENT      Occupational Therapy Plan of Care    ZO:[id number] Marlin, MD  Referring Provider: Michela Pitcher, MD  Diagnosis: R radial styloid tenosynovitis    Assessment/Objective Findings: 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 Patient currently presents with average pain 4/10 although no pain today except with R thumb extension. Finklestein negative on R. Phalen's test positive bilaterally.  Pinch strength on R limited vs. L. Patient is I with all ADL/IADL tasks but has difficulty with all tasks when numbness is present. See OT database above for details of evaluation.   Patient will benefit from skilled OT to address the rehabilitation goals outlined below.    Rehabilitation Goals  Patient to be Independent with Home Exercise Program.  Duration: 2 weeks  Average pain will be reduced from a 4/10 to a 0/10. Duration: 6 weeks  Pinch on R will ^ by 4-5 lbs to ^ ease of ADLs. Duration: 6 weeks    Long Term Goal: Reduce or eliminate pain. Duration: 6 weeks    Treatment Plan:   ** Stretching/ROM Exercise  ** Therapeutic Exercise  ** Home Exercise Program  ** Soft Tissue Mobilization  ** Ultrasound  ** Iontophoresis 4mg /ml Dex.  ** Hot/Cold Rx  ** Functional  Activities  ** Patient Education  ** Splinting      Recommend Occupational Therapy be continued 2 times per week for 6 weeks.  The rehabilitation potential for this patient is good    Patient is agreeable to treatment plan and is aware of attendance policy.    Floydene Flock, OTR/L

## 2008-03-18 LAB — ORTHOPEDIC OFFICE NOTE

## 2008-03-18 NOTE — Patient Instructions (Signed)
Rehabilitation Treatment Flowsheet    Precautions: none    Date: 03/16/08         Initials: DB         Visit #: 1         Time:          HEP X wrist and thumb stretches         Treatment(s)            EVALUATION     x           splinting     R long opponens splint and L volar wrist splint           R wrist and thumb stretches     x           MHP R wrist                [place] 1.0 w/cm2, cont, 3 mHz, 5-7 min R 1st dorsal compartment                R hand strengthening

## 2008-03-19 NOTE — Progress Notes (Addendum)
I certify that the documented Treatment Plan is reasonable and necessary.    03/19/2008  Ka-Yi Aryani Daffern, PA-C

## 2008-03-20 ENCOUNTER — Ambulatory Visit (HOSPITAL_BASED_OUTPATIENT_CLINIC_OR_DEPARTMENT_OTHER): Payer: PRIVATE HEALTH INSURANCE | Admitting: Rehabilitative and Restorative Service Providers"

## 2008-03-20 DIAGNOSIS — M654 Radial styloid tenosynovitis [de Quervain]: Secondary | ICD-10-CM

## 2008-03-20 NOTE — Patient Instructions (Addendum)
Rehabilitation Treatment Flowsheet    Precautions: none    Date: 03/16/08 03/20/08        Initials: DB DB        Visit #: 1 2        Time:          HEP X wrist and thumb stretches x        Treatment(s)            EVALUATION     x           splinting     R long opponens splint and L volar wrist splint R splint "helpful" with numbnessNo problems-neoprene worn at work          R wrist and thumb stretches     x x          MHP R wrist      x          [place] 1.0 w/cm2, cont, 3 mHz, 5-7 min R 1st dorsal compartment                R hand strengthening                Ionto R 1st dorsal compartment  2cc dex      x        MHP R wrist        x

## 2008-03-23 ENCOUNTER — Ambulatory Visit (HOSPITAL_BASED_OUTPATIENT_CLINIC_OR_DEPARTMENT_OTHER): Payer: PRIVATE HEALTH INSURANCE | Admitting: Hand Surgery

## 2008-03-23 ENCOUNTER — Ambulatory Visit (HOSPITAL_BASED_OUTPATIENT_CLINIC_OR_DEPARTMENT_OTHER): Payer: PRIVATE HEALTH INSURANCE | Admitting: Rehabilitative and Restorative Service Providers"

## 2008-03-23 DIAGNOSIS — M654 Radial styloid tenosynovitis [de Quervain]: Secondary | ICD-10-CM

## 2008-03-25 ENCOUNTER — Ambulatory Visit (HOSPITAL_BASED_OUTPATIENT_CLINIC_OR_DEPARTMENT_OTHER): Payer: PRIVATE HEALTH INSURANCE

## 2008-03-26 ENCOUNTER — Ambulatory Visit (HOSPITAL_BASED_OUTPATIENT_CLINIC_OR_DEPARTMENT_OTHER): Payer: PRIVATE HEALTH INSURANCE

## 2008-03-26 VITALS — Temp 97.6°F

## 2008-03-26 DIAGNOSIS — Z23 Encounter for immunization: Secondary | ICD-10-CM

## 2008-03-26 NOTE — Progress Notes (Signed)
Influenza Vaccine Procedure  March 26, 2008    1. Has the patient received the information for the influenza vaccine? Yes    2. Does the patient have any of the following contraindications?  Allergy to eggs? No  Allergic reaction to previous influenza vaccines? No  Any other problems to previous influenza vaccines? No  Paralyzed by Guillain-Barre syndrome?  No  Current moderate or severe illness? No  Allergy to contact lens solution? No    3. The vaccine has been administered in the usual fashion.     Immunization information reviewed. Current VIS reviewed and given to patient/ guardian. Verbal assent obtained from patient/ guardian.  See immunization/Injection module or chart review for date of publication and additional information. Verbal assent obtained from patient/guardian. Comfort measures for possible side effects reviewed.

## 2008-03-30 ENCOUNTER — Ambulatory Visit (HOSPITAL_BASED_OUTPATIENT_CLINIC_OR_DEPARTMENT_OTHER): Payer: PRIVATE HEALTH INSURANCE | Admitting: Rehabilitative and Restorative Service Providers"

## 2008-03-30 DIAGNOSIS — M654 Radial styloid tenosynovitis [de Quervain]: Secondary | ICD-10-CM

## 2008-03-30 NOTE — Progress Notes (Signed)
S: "The splint helps with numbness but not my thumb."  O: Refer to Rehabilitation Treatment Flowsheet  A: Pain persists in R thumb but numbness improved with splint wear.   P: Continue with OT per POC.

## 2008-03-30 NOTE — Progress Notes (Signed)
S: "I still have pain in my thumb and wrist."  O: Refer to Rehabilitation Treatment Flowsheet  A: Pain persists in R thumb and wrist. Patient seems compliant with splint wear including neoprene at work.   P: Continue with OT per POC.

## 2008-03-30 NOTE — Patient Instructions (Addendum)
Rehabilitation Treatment Flowsheet    Precautions: none    Date: 03/16/08 03/20/08 03/23/08       Initials: DB DB DB       Visit #: 1 2 3        Time:          HEP X wrist and thumb stretches x x       Treatment(s)            EVALUATION     x           splinting     R long opponens splint and L volar wrist splint R splint "helpful" with numbnessNo problems-neoprene worn at work x         R wrist and thumb stretches     x x x         MHP R wrist      x x         [place] 1.0 w/cm2, cont, 3 mHz, 5-7 min R 1st dorsal compartment                R hand strengthening                Ionto R 1st dorsal compartment  2cc dex      x x       MHP R wrist        x x

## 2008-04-01 ENCOUNTER — Ambulatory Visit (HOSPITAL_BASED_OUTPATIENT_CLINIC_OR_DEPARTMENT_OTHER): Payer: PRIVATE HEALTH INSURANCE | Admitting: Rehabilitative and Restorative Service Providers"

## 2008-04-06 ENCOUNTER — Ambulatory Visit (HOSPITAL_BASED_OUTPATIENT_CLINIC_OR_DEPARTMENT_OTHER): Payer: PRIVATE HEALTH INSURANCE | Admitting: Rehabilitative and Restorative Service Providers"

## 2008-04-06 DIAGNOSIS — M654 Radial styloid tenosynovitis [de Quervain]: Secondary | ICD-10-CM

## 2008-04-06 NOTE — Progress Notes (Signed)
S: "I had bad pain last night. I couldn't sleep it was so bad."  O: Refer to Rehabilitation Treatment Flowsheet. + Finklestein test on R remains.  A: Pain persists in R wrist and thumb but patient states the machine is helpful when the sessions are close together. Will continue ionto rx.  P: Continue with OT per POC.

## 2008-04-06 NOTE — Progress Notes (Signed)
S: "My thumb hurts and goes up my arm." Patient referring to volar FA as well as 1st dorsal compartment.  O: Refer to Rehabilitation Treatment Flowsheet  A: Pain R thumb/wrist continues and FA symptoms today. Patient has appointment with physiatry in February to do EMG. Patient continues with work and splint wear.  P: Continue with OT per POC.

## 2008-04-06 NOTE — Patient Instructions (Signed)
Rehabilitation Treatment Flowsheet    Precautions: none    Date: 03/16/08 03/20/08 03/23/08 03/30/08 04/06/08     Initials: DB DB DB DB DB     Visit #: 1 2 3 4 5      Time:          HEP X wrist and thumb stretches x x x x     Treatment(s)            EVALUATION     x           splinting     R long opponens splint and L volar wrist splint R splint "helpful" with numbnessNo problems-neoprene worn at work x x x       R wrist and thumb stretches     x x x x x       MHP R wrist      x x x x       [place] 1.0 w/cm2, cont, 3 mHz, 5-7 min R 1st dorsal compartment                R hand strengthening                Ionto R 1st dorsal compartment  2cc dex      x x x x     MHP R wrist        x x x x

## 2008-04-06 NOTE — Patient Instructions (Signed)
Rehabilitation Treatment Flowsheet    Precautions: none    Date: 03/16/08 03/20/08 03/23/08 03/30/08      Initials: DB DB DB DB      Visit #: 1 2 3 4       Time:          HEP X wrist and thumb stretches x x x      Treatment(s)            EVALUATION     x           splinting     R long opponens splint and L volar wrist splint R splint "helpful" with numbnessNo problems-neoprene worn at work x x        R wrist and thumb stretches     x x x x        MHP R wrist      x x x        [place] 1.0 w/cm2, cont, 3 mHz, 5-7 min R 1st dorsal compartment                R hand strengthening                Ionto R 1st dorsal compartment  2cc dex      x x x      MHP R wrist        x x x

## 2008-04-08 ENCOUNTER — Ambulatory Visit (HOSPITAL_BASED_OUTPATIENT_CLINIC_OR_DEPARTMENT_OTHER): Payer: PRIVATE HEALTH INSURANCE | Admitting: Rehabilitative and Restorative Service Providers"

## 2008-04-08 DIAGNOSIS — M654 Radial styloid tenosynovitis [de Quervain]: Secondary | ICD-10-CM

## 2008-04-13 ENCOUNTER — Ambulatory Visit (HOSPITAL_BASED_OUTPATIENT_CLINIC_OR_DEPARTMENT_OTHER): Payer: PRIVATE HEALTH INSURANCE | Admitting: Rehabilitative and Restorative Service Providers"

## 2008-04-13 DIAGNOSIS — M654 Radial styloid tenosynovitis [de Quervain]: Secondary | ICD-10-CM

## 2008-04-13 NOTE — Patient Instructions (Signed)
Rehabilitation Treatment Flowsheet    Precautions: none    Date: 03/16/08 03/20/08 03/23/08 03/30/08 04/06/08 04/08/08    Initials: DB DB DB DB DB DB    Visit #: 1 2 3 4 5 6     Time:          HEP X wrist and thumb stretches x x x x x    Treatment(s)            EVALUATION     x           splinting     R long opponens splint and L volar wrist splint R splint "helpful" with numbnessNo problems-neoprene worn at work x x x x      R wrist and thumb stretches     x x x x x x      MHP R wrist      x x x x x      [place] 1.0 w/cm2, cont, 3 mHz, 5-7 min R 1st dorsal compartment                R hand strengthening                Ionto R 1st dorsal compartment  2cc dex      x x x x x

## 2008-04-13 NOTE — Progress Notes (Signed)
S: "I am good. I have no pain." Patient reports no pain over holiday weekend except when extended pinching.  O: Refer to Rehabilitation Treatment Flowsheet.  A: Pain continues to improve. Will initiate strengthening next visit if pain remains controlled.   P: Continue with OT per POC.

## 2008-04-13 NOTE — Progress Notes (Signed)
S: "It is no too bad today but it hurts in the night."  O: Refer to Rehabilitation Treatment Flowsheet.  A: Pain improved today. Patient seems pleased. Ionto seems beneficial.  P: Continue with OT per POC.

## 2008-04-13 NOTE — Patient Instructions (Signed)
Rehabilitation Treatment Flowsheet    Precautions: none    Date: 03/16/08 03/20/08 03/23/08 03/30/08 04/06/08 04/08/08 04/13/08   Initials: DB DB DB DB DB DB DB   Visit #: 1 2 3 4 5 6 7    Time:          HEP X wrist and thumb stretches x x x x x x   Treatment(s)            EVALUATION     x           splinting     R long opponens splint and L volar wrist splint R splint "helpful" with numbnessNo problems-neoprene worn at work x x x x x     R wrist and thumb stretches     x x x x x x x     MHP R wrist      x x x x x x     [place] 1.0 w/cm2, cont, 3 mHz, 5-7 min R 1st dorsal compartment                R hand strengthening                Ionto R 1st dorsal compartment  2cc dex      x x x x x x

## 2008-04-15 ENCOUNTER — Ambulatory Visit (HOSPITAL_BASED_OUTPATIENT_CLINIC_OR_DEPARTMENT_OTHER): Payer: PRIVATE HEALTH INSURANCE | Admitting: Rehabilitative and Restorative Service Providers"

## 2008-04-20 ENCOUNTER — Ambulatory Visit (HOSPITAL_BASED_OUTPATIENT_CLINIC_OR_DEPARTMENT_OTHER): Payer: PRIVATE HEALTH INSURANCE | Admitting: Rehabilitative and Restorative Service Providers"

## 2008-04-20 DIAGNOSIS — M654 Radial styloid tenosynovitis [de Quervain]: Secondary | ICD-10-CM

## 2008-04-20 NOTE — Patient Instructions (Addendum)
Rehabilitation Treatment Flowsheet    Precautions: none    Date: 03/16/08 03/20/08 03/23/08 03/30/08 04/06/08 04/08/08 04/13/08 04/20/08   Initials: DB DB DB DB DB DB DB DB   Visit #: 1 2 3 4 5 6 7 8    Time:           HEP X wrist and thumb stretches x x x x x x x   Treatment(s)             EVALUATION     x            splinting     R long opponens splint and L volar wrist splint R splint "helpful" with numbnessNo problems-neoprene worn at work x x x x x X worn at night     R wrist and thumb stretches     x x x x x x x x     MHP R wrist      x x x x x x x     [place] 1.0 w/cm2, cont, 3 mHz, 5-7 min R 1st dorsal compartment                 R hand strengthening                 Ionto R 1st dorsal compartment  2cc dex      x x x x x x X #7                                       Rehabilitation Treatment Flowsheet    Precautions:    Date:          Initials:          Visit #:          Time:          HEP          Treatment(s)

## 2008-04-20 NOTE — Progress Notes (Signed)
S: "I am good but I can't pinch to turn a page without getting pain."   O: Refer to Rehabilitation Treatment Flowsheet. Patient unable to stay for complete ex portion today due to time constraints. Strengthening deferred.  A: Pain limited to pinching only but has had good results from ionto. Patient progressing toward goal achievement.   P: Continue with OT per POC.

## 2008-04-22 ENCOUNTER — Ambulatory Visit (HOSPITAL_BASED_OUTPATIENT_CLINIC_OR_DEPARTMENT_OTHER): Payer: PRIVATE HEALTH INSURANCE | Admitting: Rehabilitative and Restorative Service Providers"

## 2008-04-29 ENCOUNTER — Ambulatory Visit (HOSPITAL_BASED_OUTPATIENT_CLINIC_OR_DEPARTMENT_OTHER): Payer: PRIVATE HEALTH INSURANCE | Admitting: Rehabilitative and Restorative Service Providers"

## 2008-04-29 DIAGNOSIS — M654 Radial styloid tenosynovitis [de Quervain]: Secondary | ICD-10-CM

## 2008-05-04 ENCOUNTER — Ambulatory Visit (HOSPITAL_BASED_OUTPATIENT_CLINIC_OR_DEPARTMENT_OTHER): Payer: PRIVATE HEALTH INSURANCE | Admitting: Rehabilitative and Restorative Service Providers"

## 2008-05-08 NOTE — Progress Notes (Signed)
S: "I get pain when I pinch for a long time."   O: Refer to Rehabilitation Treatment Flowsheet. Strengtheing initiated.  A: Pain limited to prolonged pinching only. Patient approaching goal achievement.   P: Continue with OT per POC.

## 2008-05-08 NOTE — Patient Instructions (Signed)
Rehabilitation Treatment Flowsheet    Precautions: none    Date: 03/16/08 03/20/08 03/23/08 03/30/08 04/06/08 04/08/08 04/13/08 04/20/08   Initials: DB DB DB DB DB DB DB DB   Visit #: 1 2 3 4 5 6 7 8    Time:           HEP X wrist and thumb stretches x x x x x x x   Treatment(s)             EVALUATION     x            splinting     R long opponens splint and L volar wrist splint R splint "helpful" with numbnessNo problems-neoprene worn at work x x x x x X worn at night     R wrist and thumb stretches     x x x x x x x x     MHP R wrist      x x x x x x x     [place] 1.0 w/cm2, cont, 3 mHz, 5-7 min R 1st dorsal compartment                 R hand strengthening                 Ionto R 1st dorsal compartment  2cc dex      x x x x x x X #7                                       Rehabilitation Treatment Flowsheet    Precautions:    Date: 04/29/08         Initials: DB         Visit #: 9         Time:          HEP X putty ex         Treatment(s)            Re-evaluation                MHP R wrist     x           R wrist/thumb stretches     x           R hand strengthening     Red putty ex         Iontophoresis  R 1st dorsal compartment       X # 8

## 2008-05-11 ENCOUNTER — Ambulatory Visit (HOSPITAL_BASED_OUTPATIENT_CLINIC_OR_DEPARTMENT_OTHER): Payer: PRIVATE HEALTH INSURANCE | Admitting: Rehabilitative and Restorative Service Providers"

## 2008-05-11 DIAGNOSIS — M654 Radial styloid tenosynovitis [de Quervain]: Secondary | ICD-10-CM

## 2008-05-11 NOTE — Patient Instructions (Signed)
Rehabilitation Treatment Flowsheet    Precautions: none    Date: 03/16/08 03/20/08 03/23/08 03/30/08 04/06/08 04/08/08 04/13/08 04/20/08   Initials: DB DB DB DB DB DB DB DB   Visit #: 1 2 3 4 5 6 7 8    Time:           HEP X wrist and thumb stretches x x x x x x x   Treatment(s)             EVALUATION     x            splinting     R long opponens splint and L volar wrist splint R splint "helpful" with numbnessNo problems-neoprene worn at work x x x x x X worn at night     R wrist and thumb stretches     x x x x x x x x     MHP R wrist      x x x x x x x     [place] 1.0 w/cm2, cont, 3 mHz, 5-7 min R 1st dorsal compartment                 R hand strengthening                 Ionto R 1st dorsal compartment  2cc dex      x x x x x x X #7                                       Rehabilitation Treatment Flowsheet    Precautions:    Date: 04/29/08 05/11/08        Initials: DB DB        Visit #: 9 10        Time:          HEP X putty ex X review        Treatment(s)            Re-evaluation                MHP R wrist     x x          R wrist/thumb stretches     x x          R hand strengthening     Red putty ex Red putty ex.        Iontophoresis  R 1st dorsal compartment       X # 8 X #9

## 2008-05-11 NOTE — Progress Notes (Signed)
S: "I get pain when I pinch thin or small things...like turning the pages of a book."   O: Refer to Nurse, adult.   A: Pain limited to prolonged pinching only. Anticipate d/c with ionto completion in one more visit. Patient approaching goal achievement.   P: Continue with OT per POC.

## 2008-05-18 ENCOUNTER — Ambulatory Visit (HOSPITAL_BASED_OUTPATIENT_CLINIC_OR_DEPARTMENT_OTHER): Payer: PRIVATE HEALTH INSURANCE | Admitting: Rehabilitative and Restorative Service Providers"

## 2008-05-19 ENCOUNTER — Ambulatory Visit (HOSPITAL_BASED_OUTPATIENT_CLINIC_OR_DEPARTMENT_OTHER): Payer: PRIVATE HEALTH INSURANCE | Admitting: Physical Medicine & Rehabilitation

## 2008-05-27 ENCOUNTER — Ambulatory Visit (HOSPITAL_BASED_OUTPATIENT_CLINIC_OR_DEPARTMENT_OTHER): Payer: PRIVATE HEALTH INSURANCE | Admitting: Rehabilitative and Restorative Service Providers"

## 2008-05-27 DIAGNOSIS — M654 Radial styloid tenosynovitis [de Quervain]: Secondary | ICD-10-CM

## 2008-05-28 NOTE — Progress Notes (Signed)
OCCUPATIONAL THERAPY  DISCHARGE REPORT    DIAGNOSIS: R radial styloid tenosynovitis  MD: Michela Pitcher, MD    Your patient, Deborah [name], has been discharged from [facility] after a total of 11 visits.    REASON(S) FOR DISCHARGE:    This patient has: Met therapy goals and returned to prior functional level.    RECOMMENDATIONS:    This patient should: Continue with HEP and use pain control measures as instructed upon return of pain.    COMMENTS:    If you have any questions please feel free to contact the department at [facility], [place] .    Therapist: Floydene Flock, OTR/L

## 2008-05-28 NOTE — Patient Instructions (Signed)
Rehabilitation Treatment Flowsheet    Precautions: none    Date: 03/16/08 03/20/08 03/23/08 03/30/08 04/06/08 04/08/08 04/13/08 04/20/08   Initials: DB DB DB DB DB DB DB DB   Visit #: 1 2 3 4 5 6 7 8    Time:           HEP X wrist and thumb stretches x x x x x x x   Treatment(s)             EVALUATION     x            splinting     R long opponens splint and L volar wrist splint R splint "helpful" with numbnessNo problems-neoprene worn at work x x x x x X worn at night     R wrist and thumb stretches     x x x x x x x x     MHP R wrist      x x x x x x x     [place] 1.0 w/cm2, cont, 3 mHz, 5-7 min R 1st dorsal compartment                 R hand strengthening                 Ionto R 1st dorsal compartment  2cc dex      x x x x x x X #7                                       Rehabilitation Treatment Flowsheet    Precautions:    Date: 04/29/08 05/11/08 05/27/08       Initials: DB DB DB       Visit #: 9 10 11        Time:          HEP X putty ex X review X review HEP        Treatment(s)            Re-evaluation                MHP R wrist     x x x         R wrist/thumb stretches     x x X review for HEP          R hand strengthening     Red putty ex Red putty ex. X to continue with HEP       Iontophoresis  R 1st dorsal compartment       X # 8 X #9 Deferred as pain resolved         splinting       Discussed use if pain returns

## 2008-06-08 ENCOUNTER — Ambulatory Visit (HOSPITAL_BASED_OUTPATIENT_CLINIC_OR_DEPARTMENT_OTHER): Payer: PRIVATE HEALTH INSURANCE | Admitting: Physical Medicine & Rehabilitation

## 2008-06-08 DIAGNOSIS — I471 Supraventricular tachycardia, unspecified: Secondary | ICD-10-CM

## 2008-06-08 HISTORY — DX: Supraventricular tachycardia: I47.1

## 2008-06-08 HISTORY — DX: Supraventricular tachycardia, unspecified: I47.10

## 2008-06-15 ENCOUNTER — Inpatient Hospital Stay (HOSPITAL_BASED_OUTPATIENT_CLINIC_OR_DEPARTMENT_OTHER)
Admission: RE | Admit: 2008-06-15 | Disposition: A | Discharge status: Home / Self Care | Payer: Self-pay | Source: Emergency Department | Attending: Emergency Medicine | Admitting: Emergency Medicine

## 2008-06-15 ENCOUNTER — Encounter (HOSPITAL_BASED_OUTPATIENT_CLINIC_OR_DEPARTMENT_OTHER): Payer: Self-pay

## 2008-06-15 HISTORY — DX: Gastro-esophageal reflux disease without esophagitis: K21.9

## 2008-06-15 LAB — HOLD BLUE TOP TUBE

## 2008-06-15 LAB — SERUM DRUG SCREEN 6 DRUGS
ACETAMINOPHEN: <10 ug/ml — ABNORMAL LOW (ref 10–30)
BARBITURATE: NEGATIVE
BENZODIAZEPINE: NEGATIVE
ETHANOL: <10 mg/dl (ref <10)
SALICYLATE: <4 mg/dl — ABNORMAL LOW (ref 4–29)
TRICYCLIC ANTIDEPRESSANTS: NEGATIVE

## 2008-06-15 LAB — BLOOD COUNT COMPLETE AUTO&AUTO DIFRNTL WBC
BASOPHIL %: 0.4 % (ref 0.0–2.0)
EOSINOPHIL %: 0.6 % (ref 0.0–7.0)
HEMATOCRIT: 38.2 % (ref 36.0–48.0)
HEMOGLOBIN: 13.1 g/dl (ref 12.0–16.0)
LYMPHOCYTE %: 25.3 % (ref 13.0–39.0)
MEAN CORP HGB CONC: 34.3 g/dl (ref 32.0–36.0)
MEAN CORPUSCULAR HGB: 31.4 pg (ref 27.0–33.0)
MEAN CORPUSCULAR VOL: 91.3 fl (ref 80.0–100.0)
MEAN PLATELET VOLUME: 8.8 fl (ref 6.4–10.8)
MONOCYTE %: 8.2 % (ref 1.0–12.0)
NEUTROPHIL %: 65.5 % (ref 46.0–79.0)
PLATELET COUNT: 212 10*3/uL (ref 150–400)
RBC DISTRIBUTION WIDTH: 11.8 % (ref 11.5–14.3)
RED BLOOD CELL COUNT: 4.18 M/uL — ABNORMAL LOW (ref 4.50–5.10)
WHITE BLOOD CELL COUNT: 10.1 10*3/uL (ref 4.0–10.8)

## 2008-06-15 LAB — URINE DRUG SCREEN 7 DRUGS
AMPHETAMINES URINE: NEGATIVE
BARBITURATES URINE: NEGATIVE
BENZODIAZEPINES URINE: NEGATIVE
CANNABINOIDS URINE: NEGATIVE
COCAINE METABOLITES URINE: NEGATIVE
ETHANOL URINE: NEGATIVE
OPIATES URINE: NEGATIVE

## 2008-06-15 LAB — TSH (THYROID STIMULATING HORMONE): TSH (THYROID STIM HORMONE): 2.49 u[IU]/mL (ref 0.34–5.60)

## 2008-06-15 LAB — HCG QUALITATIVE SERUM: HCG QUALITATIVE SERUM: NEGATIVE

## 2008-06-15 LAB — BASIC METABOLIC PANEL
ANION GAP: 11 mmol/L (ref 2–25)
BUN (UREA NITROGEN): 17 mg/dl (ref 6–20)
CALCIUM: 9.3 mg/dl (ref 8.6–10.3)
CARBON DIOXIDE: 26 mmol/L (ref 22–32)
CHLORIDE: 104 mmol/L (ref 101–111)
CREATININE: 0.7 mg/dl (ref 0.4–1.2)
ESTIMATED GLOMERULAR FILT RATE: >60 mL/min (ref 60–116)
Glucose Random: 111 mg/dl (ref 74–160)
POTASSIUM: 3.6 mmol/L (ref 3.5–5.1)
SODIUM: 141 mmol/L (ref 135–144)

## 2008-06-15 LAB — XR CHEST PORTABLE

## 2008-06-15 LAB — PROTHROMBIN TIME
INR: 1 — ABNORMAL LOW (ref 2.0–3.5)
PROTHROMBIN TIME: 10 SECONDS (ref 9.5–11.5)

## 2008-06-15 LAB — CTA CHEST W &/OR WO CONTRAST

## 2008-06-15 LAB — CHG ASSAY OF THYROXINE TOTAL: THYROXINE (T4): 9.9 ug/dl (ref 6.09–12.23)

## 2008-06-15 LAB — D-DIMER PE/DVT, QUANTITATIVE: D-DIMER PE/DVT, QUANTITATIVE: 20.16 mg/L FEU (ref 0.00–0.99)

## 2008-06-15 LAB — EKG

## 2008-06-15 LAB — CK-MB PROFILE
CK INDEX: 4.5 %
CKMB: 3.8 ng/mL (ref 0.0–4.0)
CREATINE KINASE TOTAL: 84 IU/L (ref 21–215)

## 2008-06-15 LAB — HOLD PURPLE TOP TUBE

## 2008-06-15 LAB — CHG ASSAY OF MAGNESIUM: MAGNESIUM: 1.9 mg/dl (ref 1.8–2.6)

## 2008-06-15 LAB — APTT: APTT: 25.1 SECONDS (ref 23.7–33.3)

## 2008-06-15 LAB — TROPONIN I: TROPONIN I: 0.6 ng/mL (ref 0.00–0.04)

## 2008-06-15 NOTE — ED Notes (Signed)
Pt states feels dizzy.States tongue feels heavy and left arm feels cold. Also states had a headache last night and this am. Pt denies any chest pain at this time.

## 2008-06-16 ENCOUNTER — Other Ambulatory Visit (HOSPITAL_BASED_OUTPATIENT_CLINIC_OR_DEPARTMENT_OTHER): Payer: Self-pay | Admitting: Internal Medicine

## 2008-06-16 ENCOUNTER — Encounter (HOSPITAL_BASED_OUTPATIENT_CLINIC_OR_DEPARTMENT_OTHER): Payer: Self-pay | Admitting: Internal Medicine

## 2008-06-16 DIAGNOSIS — I471 Supraventricular tachycardia, unspecified: Secondary | ICD-10-CM | POA: Insufficient documentation

## 2008-06-16 LAB — BASIC METABOLIC PANEL
ANION GAP: 6 mmol/L (ref 2–25)
BUN (UREA NITROGEN): 9 mg/dl (ref 6–20)
CALCIUM: 9.1 mg/dl (ref 8.6–10.3)
CARBON DIOXIDE: 29 mmol/L (ref 22–32)
CHLORIDE: 106 mmol/L (ref 101–111)
CREATININE: 0.6 mg/dl (ref 0.4–1.2)
ESTIMATED GLOMERULAR FILT RATE: >60 mL/min (ref >60)
Glucose Random: 90 mg/dl (ref 74–160)
POTASSIUM: 3.4 mmol/L — ABNORMAL LOW (ref 3.5–5.1)
SODIUM: 141 mmol/L (ref 135–144)

## 2008-06-16 LAB — EKG-12 ** TO BE DONE BY EKG **

## 2008-06-16 LAB — CHG ASSAY OF MAGNESIUM: MAGNESIUM: 2.1 mg/dl (ref 1.8–2.6)

## 2008-06-16 LAB — CK-MB PROFILE
CK INDEX: 3.3 %
CK INDEX: 3.3 %
CKMB: 3.8 ng/mL (ref 0.0–4.0)
CKMB: 3.1 ng/mL (ref 0.0–4.0)
CREATINE KINASE TOTAL: 114 IU/L (ref 21–215)
CREATINE KINASE TOTAL: 95 IU/L (ref 21–215)

## 2008-06-16 LAB — BLOOD COUNT COMPLETE AUTOMATED
HEMATOCRIT: 34.7 % — ABNORMAL LOW (ref 36.0–48.0)
HEMOGLOBIN: 11.9 g/dl — ABNORMAL LOW (ref 12.0–16.0)
MEAN CORP HGB CONC: 34.3 g/dl (ref 32.0–36.0)
MEAN CORPUSCULAR HGB: 31.6 pg (ref 27.0–33.0)
MEAN CORPUSCULAR VOL: 92.1 fl (ref 80.0–100.0)
MEAN PLATELET VOLUME: 8.4 fl (ref 6.4–10.8)
PLATELET COUNT: 200 10*3/uL (ref 150–400)
RBC DISTRIBUTION WIDTH: 12.1 % (ref 11.5–14.3)
RED BLOOD CELL COUNT: 3.77 M/uL — ABNORMAL LOW (ref 4.50–5.10)
WHITE BLOOD CELL COUNT: 6.2 10*3/uL (ref 4.0–10.8)

## 2008-06-16 LAB — CHG ASSAY OF PHOSPHORUS INORGANIC: PHOSPHORUS: 3.9 mg/dl (ref 2.7–4.7)

## 2008-06-16 LAB — CHG LIPID PANEL
Cholesterol: 120 mg/dl (ref 0–200)
HIGH DENSITY LIPOPROTEIN: 42 mg/dl (ref 35–85)
LOW DENSITY LIPOPROTEIN DIRECT: 67 mg/dl (ref 0–100)
RISK FACTOR: 2.9 (ref ?–4.4)
TRIGLYCERIDES: 47 mg/dl (ref 0–150)

## 2008-06-16 LAB — TROPONIN I
TROPONIN I: 0.42 ng/mL — ABNORMAL HIGH (ref 0.00–0.04)
TROPONIN I: 0.26 ng/mL — ABNORMAL HIGH (ref 0.00–0.04)

## 2008-06-16 LAB — EKG

## 2008-06-17 ENCOUNTER — Encounter (HOSPITAL_BASED_OUTPATIENT_CLINIC_OR_DEPARTMENT_OTHER): Payer: Self-pay | Admitting: Registered Nurse

## 2008-06-17 ENCOUNTER — Encounter (HOSPITAL_BASED_OUTPATIENT_CLINIC_OR_DEPARTMENT_OTHER): Payer: Self-pay | Admitting: Internal Medicine

## 2008-06-22 LAB — CONSULTATION

## 2008-06-23 ENCOUNTER — Ambulatory Visit (HOSPITAL_BASED_OUTPATIENT_CLINIC_OR_DEPARTMENT_OTHER): Payer: Self-pay

## 2008-06-23 ENCOUNTER — Ambulatory Visit (HOSPITAL_BASED_OUTPATIENT_CLINIC_OR_DEPARTMENT_OTHER): Payer: PRIVATE HEALTH INSURANCE | Admitting: Internal Medicine

## 2008-06-23 ENCOUNTER — Encounter (HOSPITAL_BASED_OUTPATIENT_CLINIC_OR_DEPARTMENT_OTHER): Payer: Self-pay | Admitting: Internal Medicine

## 2008-06-23 DIAGNOSIS — I471 Supraventricular tachycardia, unspecified: Secondary | ICD-10-CM

## 2008-06-23 LAB — EMERGENCY ROOM NOTE

## 2008-06-23 MED ORDER — METOPROLOL SUCCINATE ER 50 MG PO TB24
ORAL_TABLET | ORAL | Status: DC
Start: 2008-06-23 — End: 2009-08-30

## 2008-06-23 NOTE — Progress Notes (Signed)
x

## 2008-06-24 ENCOUNTER — Ambulatory Visit (HOSPITAL_BASED_OUTPATIENT_CLINIC_OR_DEPARTMENT_OTHER): Payer: PRIVATE HEALTH INSURANCE | Admitting: Internal Medicine

## 2008-06-24 LAB — EMERGENCY ROOM NOTE

## 2008-06-25 ENCOUNTER — Telehealth (HOSPITAL_BASED_OUTPATIENT_CLINIC_OR_DEPARTMENT_OTHER): Payer: Self-pay | Admitting: Registered Nurse

## 2008-06-25 NOTE — Telephone Encounter (Signed)
Staff Message copied by Olevia Bowens on Thu Jun 25, 2008 12:58 PM  ------   Message from: Onnie Graham   Created: Thu Jun 25, 2008 12:50 PM   Regarding: Pt needs appt for next week   Contact: [phone]    Ilene Witcher [id number], 43 year old, female, Telephone Information:  Home Phone [phone]  Work Phone [phone]      [name] NUMBER: [phone]  Best time to call back: Anytime  Cell phone:   Other phone:    Available times:    Patient's language of care: [place]    Patient needs a [place] interpreter.    Patient's PCP: Gwenith Spitz, MD    Person calling on behalf of patient: Patient (self)    Pt called to reschedule missed appt on 06/24/08. Pt need appt [facility] - er f/u heartburn-post hosp f/u for early next week.    Thanks      Patient's Preferred Pharmacy:   [facility] ([facility])  Phone: [phone] Fax: [phone]

## 2008-06-25 NOTE — Telephone Encounter (Signed)
TC to pt. Requesting appt for next week for sev'l day hx of bilat leg "tiredness". Denies "Pain" persay. Appt next Tue 06/30/08 w/PCP, pt agrees. Will call in interim prn.

## 2008-06-30 ENCOUNTER — Ambulatory Visit: Payer: Self-pay | Admitting: Internal Medicine

## 2008-06-30 ENCOUNTER — Encounter (HOSPITAL_BASED_OUTPATIENT_CLINIC_OR_DEPARTMENT_OTHER): Payer: Self-pay | Admitting: Internal Medicine

## 2008-06-30 ENCOUNTER — Ambulatory Visit (HOSPITAL_BASED_OUTPATIENT_CLINIC_OR_DEPARTMENT_OTHER): Payer: PRIVATE HEALTH INSURANCE | Admitting: Internal Medicine

## 2008-06-30 VITALS — BP 120/70 | HR 68 | Temp 98.5°F | Wt 158.0 lb

## 2008-06-30 DIAGNOSIS — Z833 Family history of diabetes mellitus: Secondary | ICD-10-CM

## 2008-06-30 DIAGNOSIS — E559 Vitamin D deficiency, unspecified: Secondary | ICD-10-CM

## 2008-06-30 DIAGNOSIS — E876 Hypokalemia: Secondary | ICD-10-CM

## 2008-06-30 DIAGNOSIS — I471 Supraventricular tachycardia, unspecified: Secondary | ICD-10-CM

## 2008-06-30 DIAGNOSIS — K219 Gastro-esophageal reflux disease without esophagitis: Secondary | ICD-10-CM

## 2008-06-30 DIAGNOSIS — M79606 Pain in leg, unspecified: Secondary | ICD-10-CM

## 2008-06-30 DIAGNOSIS — E786 Lipoprotein deficiency: Secondary | ICD-10-CM

## 2008-06-30 LAB — BASIC METABOLIC PANEL
ANION GAP: 9 mmol/L (ref 2–25)
BUN (UREA NITROGEN): 13 mg/dl (ref 6–20)
CALCIUM: 9.6 mg/dl (ref 8.6–10.3)
CARBON DIOXIDE: 28 mmol/L (ref 22–32)
CHLORIDE: 105 mmol/L (ref 101–111)
CREATININE: 0.6 mg/dl (ref 0.4–1.2)
ESTIMATED GLOMERULAR FILT RATE: >60 mL/min (ref >60)
Glucose Random: 81 mg/dl (ref 74–160)
POTASSIUM: 3.9 mmol/L (ref 3.5–5.1)
SODIUM: 142 mmol/L (ref 135–144)

## 2008-06-30 MED ORDER — OMEPRAZOLE 20 MG PO TBEC
DELAYED_RELEASE_TABLET | ORAL | Status: DC
Start: 2008-07-16 — End: 2008-07-08

## 2008-06-30 NOTE — Progress Notes (Signed)
Deborah [name] is a 43 year old female    Review of Patient's Allergies indicates:   Ibuprofen               Swelling   Nsaids                  Rash, Swelling    Comment: Labial edema with alleve, rash as well   Pollen extract/tree*    Runny Nose    Comment: HA, itchy watery eyes      Current outpatient prescriptions:  OMEPRAZOLE 20 MG OR TBEC 1 TABLET DAILY Disp: 0 Rfl: 0   METOPROLOL SUCCINATE 50 MG OR TB24 1 TABLET DAILY Disp: 30 Rfl: 11   CALCIUM CARBONATE-VIT D-MIN 600-400 MG-UNIT OR CHEW two tablets daily Disp: 60 Rfl: 11       Patient Active Problem List:     LUMP OR MASS IN BREAST [611.72]     ESOPHAGEAL REFLUX [530.81]     GASTRITIS NEC W/O HEMORRH [535.40]     ABDOMINAL PAIN EPIGASTRIC [789.06]     ANGIONEUROTIC EDEMA [995.1]     Plantar Fasciitis, L [728.35F]     Leg Pain [729.5H]     Tobacco Abuse, in Remission [V15.82K]     Triggering of Finger, R 3rd [727.03G]     Right Shoulder Pain [719.41X]     Chondromalacia Patellae [717.7A]     Lower Back Pain [724.2U]     De Quervain's Tenosynovitis, R [727.04L]     CTS (Carpal Tunnel Syndrome), b/l [354.0J]     SVT (Supraventricular Tachycardia) [427.0E]     Family History of Diabetes Mellitus (DM) [V18.0B]     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]      Social History   Marital Status: Married  Spouse Name: N/A    Years of Education: N/A  Number of Children: 1     Occupational History  housecleaning       Social History Main Topics   Tobacco Use: Quit  0.5 Packs/Day  For 27 Years     Quit date:  06/28/2002      Alcohol Use: No    Drug Use: No    Sexually Active: Not Currently  Partner(s): Female    Birth Control/ Protection: Pill      Comment: no STI hx; HIV neg 2002       Other Topics Concern    Stress Concern Yes    Comment: rev    Weight Concern Yes    Comment: rev    Special Diet Yes    Comment: rev    Exercise No    Seat Belt Yes    Comment: rev    Self-Exams Yes    Comment: rev     Social History Narrative    [place], [place]. To [place] 2000.    Lives with her son  and her parents. Divorced from husband.    Denies DV. No guns in home.     Pt here for f/u re SVT, leg pain, low HDL and FHx DM after recent [facility] admission.    Recently admitted to [facility] for SVT. Seen by T Risser, MD in f/u with neg evaluation to date with pt currently denying any CP, but noting that she recently had an episode of CP feeling like "a cut across my chest," with no associated SOB, tachycardia or palpitations.    Notes b/l leg pain recurrent, with pt noting that after she left [facility], she had R upper posterior leg pain radiating  to calf, "tired" pain, that caused her to stop in the street and not resolving until later that night. Notes continued now b/l posterior leg pain, 6/10, improved since [facility] d/c. Exacerbated by seated position, walking and standing. Denies any trauma to back, bowel or bladder habits, or loss of strength or sensation at LEs b/l.    Denies polyuria/dipsia.  Denies any regular exercise.    Filed Vitals:    06/30/2008   2:00 PM   BP: 120/70   Pulse: 68   Temp: 98.5 F (36.9 C)   TempSrc: Temporal   Weight: 158 lb (71.668 kg)   SpO2: 100%       PE: WN WD in NAD  HEENT - NCAT EOMI   CV - RRR s1s 2no MRGs  Back - no TTP at vert/paraspinal mxls  Neuro - A&O x 3, nml gait; nml LE strength b/l, no sensation to light touch b/l    729.5H Leg Pain  (primary encounter diagnosis)  Comment: unclear etiology but previous rheum and metabolic eval neg - possible stenosis vs. Facet arthropathy vs. Low Fe vs. Thyroid nml recently vs. Metabolic def'y  Plan: ROUTINE VENIPUNCTURE, HEMOGLOBIN A1C, VITAMIN         D,25 HYDROXY, VITAMIN B-12, IRON, IRON BINDING         TEST, FERRITIN, REFERRAL TO PHYSIATRY (INT)        F/u c PCP after physiatry    530.81 Esophageal Reflux  Comment: stable  Plan: OMEPRAZOLE 20 MG OR TBEC            427.0E SVT (Supraventricular Tachycardia)  Comment: unclear etiology, evaluation with cards pending, currently asx  Plan: f/u c cards    V18.0B Family History of Diabetes Mellitus  (DM)  Comment: mother; overwt  Plan: A1c    272.5AK Hypolipoproteinemia  Comment: prev noted  Plan: ROUTINE VENIPUNCTURE, LIPID PANEL        Needs to exercise regularly; f/u after physiatry    276.8A Hypokalemia  Comment: prev noted, asx  Plan: ROUTINE VENIPUNCTURE, POTASSIUM    I have reviewed the past medical, surgical, social and family history and updated these sections of EpicCare as relevant. All interim labs, test results, and consult notes were reviewed and discussed with Deborah [name]. Medications were reconciled during this visit and a current medication list was given to the patient at the end of the visit.    F/u c PCP prn    I have spent 25 minutes in face to face time with this patient/patient proxy of which > 50% was in counseling or coordination of care regarding above issues/Dx.

## 2008-07-01 LAB — EKG

## 2008-07-02 LAB — ECHOCARDIOGRAM W/ DOPPLER

## 2008-07-07 ENCOUNTER — Ambulatory Visit (HOSPITAL_BASED_OUTPATIENT_CLINIC_OR_DEPARTMENT_OTHER): Payer: PRIVATE HEALTH INSURANCE | Admitting: Internal Medicine

## 2008-07-07 DIAGNOSIS — I471 Supraventricular tachycardia, unspecified: Secondary | ICD-10-CM

## 2008-07-07 NOTE — Progress Notes (Signed)
x

## 2008-07-08 ENCOUNTER — Ambulatory Visit (HOSPITAL_BASED_OUTPATIENT_CLINIC_OR_DEPARTMENT_OTHER): Payer: PRIVATE HEALTH INSURANCE | Admitting: Physical Medicine & Rehabilitation

## 2008-07-08 ENCOUNTER — Encounter (HOSPITAL_BASED_OUTPATIENT_CLINIC_OR_DEPARTMENT_OTHER): Payer: Self-pay | Admitting: Internal Medicine

## 2008-07-08 DIAGNOSIS — K219 Gastro-esophageal reflux disease without esophagitis: Secondary | ICD-10-CM

## 2008-07-08 DIAGNOSIS — G9519 Other vascular myelopathies: Secondary | ICD-10-CM | POA: Insufficient documentation

## 2008-07-08 DIAGNOSIS — M67919 Unspecified disorder of synovium and tendon, unspecified shoulder: Secondary | ICD-10-CM

## 2008-07-08 DIAGNOSIS — I739 Peripheral vascular disease, unspecified: Secondary | ICD-10-CM

## 2008-07-08 MED ORDER — ACETAMINOPHEN 500 MG PO CAPS
ORAL_CAPSULE | ORAL | Status: AC
Start: 2008-07-08 — End: 2008-09-07

## 2008-07-08 MED ORDER — KENALOG 40 MG/ML IJ SUSP
INTRAMUSCULAR | Status: AC
Start: 2008-07-08 — End: 2008-07-09

## 2008-07-08 MED ORDER — OMEPRAZOLE 20 MG PO TBEC
DELAYED_RELEASE_TABLET | ORAL | Status: DC
Start: 2008-07-08 — End: 2008-12-30

## 2008-07-08 MED ORDER — LIDOCAINE HCL (LOCAL ANESTH.) 1 % IJ SOLN
INTRAMUSCULAR | Status: AC
Start: 2008-07-08 — End: 2008-07-09

## 2008-07-08 NOTE — Progress Notes (Addendum)
Addended byLennart Pall on: 07/08/2008 1:46:52 PM     Modules accepted: Level of Service

## 2008-07-08 NOTE — Progress Notes (Signed)
The primary progress note for this visit has been dictated through E-Scription. It can be viewed as an attachment to this encounter or through Chart Review under the Other Tab as an Orthopedic Office Note.      Review of Systems: Constitutional, Eyes, ENT/Mouth, Cardiovascular, Respiratory, GI, GU, Neuro, Psych, Heme/Lymph, Skin, Musculoskeletal was reviewed and is NEGATIVE except for what is dictated in the note.    MT ACCT #:  [id number] PATIENT/PROCEDURE VERIFICATION DOCUMENTATION    Correct patient: Yes  Correct procedure: Yes  Correct site, mark visible if applicable: Yes    Pre-procedure Vital Signs:  BP: N/A  P: N/A  R: N/A    Risks and Benefits reviewed: Yes  Side: Right  Correct position: Yes  Special equipment/implant(s) present, if applicable: Yes    Post-procedure Vitals Signs:  BP: N/A  P: N/A  R: N/A    MEDICATION DOSE VOLUME   Xylocaine 1% - epinephrine free ------------- Ml 4   Bupivicaine 0.5% - epinephrine free ------------- ml   Depo-medrol 20mg /ml mg ml   Depo-medrol 40mg /ml mg ml   Depo-medrol 80mg /ml mg ml   Kenalog 40mg /ml Mg 40 Ml 1   Dexamethasone 10mg /ml mg ml   Other:  mg ml       Time-out completed, documented by provider doing procedure or designated team member:  Lorena Clearman A. Danelle Earthly, MD    07/08/2008    12:39 PM

## 2008-07-08 NOTE — Progress Notes (Addendum)
Addended byLennart Pall on: 07/08/2008 1:41:07 PM     Modules accepted: Level of Service

## 2008-07-10 ENCOUNTER — Encounter (HOSPITAL_BASED_OUTPATIENT_CLINIC_OR_DEPARTMENT_OTHER): Payer: Self-pay

## 2008-07-10 DIAGNOSIS — E559 Vitamin D deficiency, unspecified: Secondary | ICD-10-CM | POA: Insufficient documentation

## 2008-07-10 LAB — HEMOGLOBIN A1C: HEMOGLOBIN A1C: 5.2 % (ref 0–6.0)

## 2008-07-10 LAB — IRON: IRON: 56 ug/dl (ref 28–170)

## 2008-07-10 LAB — CHG ASSAY OF FERRITIN: FERRITIN: 107 ng/mL (ref 11–307)

## 2008-07-10 LAB — CHG LIPID PANEL
Cholesterol: 161 mg/dl (ref 0–200)
HIGH DENSITY LIPOPROTEIN: 47 mg/dl (ref 35–85)
LOW DENSITY LIPOPROTEIN DIRECT: 87 mg/dl (ref 0–100)
RISK FACTOR: 3.4 (ref ?–4.4)
TRIGLYCERIDES: 296 mg/dl — ABNORMAL HIGH (ref 0–150)

## 2008-07-10 LAB — CHG IRON BINDING CAPACITY: TOTAL IRON BIND CAPACITY CALC: 323 ug/dL (ref 282–427)

## 2008-07-10 LAB — POTASSIUM: POTASSIUM: 3.8 mmol/L (ref 3.5–5.1)

## 2008-07-10 LAB — VITAMIN D,25 HYDROXY: VITAMIN D,25 HYDROXY: 15 ng/ml — CL (ref 30.0–100.0)

## 2008-07-10 LAB — CYANOCOBALAMIN VITAMIN B-12: VITAMIN B12: 408 pg/mL (ref 180–914)

## 2008-07-12 LAB — ORTHOPEDIC OFFICE NOTE

## 2008-07-14 ENCOUNTER — Encounter (HOSPITAL_BASED_OUTPATIENT_CLINIC_OR_DEPARTMENT_OTHER): Payer: Self-pay | Admitting: Physical Medicine & Rehabilitation

## 2008-07-14 ENCOUNTER — Ambulatory Visit: Payer: Self-pay | Admitting: Physical Medicine & Rehabilitation

## 2008-07-14 LAB — CHG SEDIMENTATION RATE RBC NON-AUTOMATED: RBC SEDIMENTATION RATE: 15 MM/HR (ref 0–15)

## 2008-07-14 LAB — C-REACTIVE PROTEIN: C-REACTIVE PROTEIN: 0.5 mg/dL (ref 0–1.8)

## 2008-07-20 LAB — MEDICAL SPECIALTIES LETTER

## 2008-07-29 ENCOUNTER — Ambulatory Visit (HOSPITAL_BASED_OUTPATIENT_CLINIC_OR_DEPARTMENT_OTHER): Payer: PRIVATE HEALTH INSURANCE

## 2008-08-24 ENCOUNTER — Ambulatory Visit (HOSPITAL_BASED_OUTPATIENT_CLINIC_OR_DEPARTMENT_OTHER): Payer: PRIVATE HEALTH INSURANCE

## 2008-08-24 DIAGNOSIS — M79606 Pain in leg, unspecified: Secondary | ICD-10-CM

## 2008-08-24 NOTE — Progress Notes (Signed)
OUTPATIENT EVALUATION    REFERRING PROVIDER:     Magaly A. Danelle Earthly, MD  [address]  [place], [place] [address]     Hx OF PRESENT ILLNESS: Pt presents to physical therapy with reports of pain throughout her body. Pt arrrives with script for both rotator cuff and intermittent claudication.  She reports she got a cortizone shot in her right shoulder and he arm feels better since then.  She states that she a lot of leg pain in both legs but the right is worse than the left. It travels down from her buttock into the posterior part of both legs down into the heels. Pain in the calf muscles is worse when walking up a hill, and pain in the knee is worsened when walking down the hill. Pain is worsened by walking, varying distances can create the pain. Squatting is also painful, and difficult to arise from.  Going up stairs pt reports she has more pain and feels more tired.    Pt has a history of low back pain x 4-5 years, which is worsened when she lifts and goes from sit to stand (produces pain in the hips) Bending over hurts in the back as well. 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 Following work/driving home she has B hip pain, LBP and fatigue in her legs. Pt also reports that her B hip pain is horrible if she sits for a long time.      Pain at rest laying down: pt reports symptoms in anterior right thigh, and right calf muscle.    Vitals signs:   Blood pressure: 110/71 HR: 68    PRECAUTIONS: times one episode of Supraventricular tachycardia.(lightheadedness, increased HR)      MEDICATIONS (Rx Comments, concerns): For a list of current medications review the Medication activity.    LEARNS BEST:    MENTAL STATUS/COMMUNICATION: WNL     PRIMARY LANGUAGE: [place]     REQUIRES INTERPRETER: Yes   INTERPRETER PRESENT DURING EVAL: Yes   DRESSING/GROOMING: IMPAIRED: Pt reports putting on her shoes and socks is difficult.       DRIVING: IMPAIRED: difficulty getting out of the car at the end of the day.       SLEEPING: sometimes fatigue/pain in lower legs prevent her from falling asleep.      POSTURE/ALIGNMENT: increased lumbar lordosis     OTHER: flexion: pain in posterior legs bilaterally with bending forward, upon returning to standing pain in low back. Extension: hinges in low back with pain and decreased ROM by 50%. WNL for sidebending/rotation in both directions.      NEUROLOGICAL: WNL- 2+ in reflexes throughout, Myotomal strength testing: normal, Sensation normal.      PALPATION:   Pulses: normal pulse bilaterally in LEs.      GAIT: able to heel walk/toe walk without difficulty, normal heel toe gait pattern.      SKIN INTEGRITY: WNL       Please Note: Only populated fields were assessed by provider, fields left blank were not assessed.    GIRTH LEFT RIGHT GIRTH LEFT RIGHT GIRTH LEFT RIGHT                           OTHER:    USE IMAGES ACTIVITY. IMAGE AVAILABLE: No      LEFT RIGHT  LEFT RIGHT   SHOULD A/PROM MMT A/PROM MMT HIP A/PROM MMT A/PROM MMT   FLEX.  FLEX.  4pn in hip  3+pn in hip   EXT.     EXT.       IR     IR       ER     ER       ABD     ABD       H. ABD     ADD       H. ADD     KNEE       ELBOW     FLEX  5  4 pn in anterior knee and right thigh   FLEX.     EXT  5  5   EXT.     ANKLE       WRIST     DF       FLEX.     PF       EXT.     IV       SUP/  PRON     EV          SPECIAL TEST LEFT RIGHT SPECIAL TEST LEFT RIGHT SPECIAL TEST LEFT RIGHT   Slump test + + Repeated extension Incr pn in back       SLR  + +         Traction Alleviates back pn          Repeated extension: creates pain in the back, decreases pain in anterior thigh, no change in calf pain.   Poor core stability    Physical Therapy Plan of Care    ZO:XWRUEA Maggie Schwalbe, MD  Referring Provider: Lennart Pall, MD  Diagnosis:     Assessment/Objective Findings: Patient is a 43 year old female who reports to physical therapy with multiple reports of pain.  Tried to focus patient on one region of pain however difficult secondary to reports of pain  in right shoulder, low back, B hips, anterior thigh and bilateral calf pain.  Possibility of patient's multiple reports of pain coming from vitamin D deficiency for patient does not take any medication to correct this deficiency at this time. Instructed patient to call MD regarding vitamin D levels.  After examination, some of the pain in patient's legs is consistent with neural pain coming from the back as seen with positive SLR and slump tests bilaterally.  Traction performed in hooklying was sucessful in decreasing patient's pain levels. Pt sent to PT with script for intermittent claudication and signs of increased pain with increased walking uphill and going upstairs would be consistent with vascular claudication.  Treadmill test would be beneficial however pt wore inappropriate footwear for assessment on treadmill today.   Further examination is necessary to determine etiology of pain when patient wears appropriate shoewear. Pt was also sent with a script for right rotator cuff pain however after receiving an injection that pain has subsided and pt would like to focus on her legs.      Short Term Goals:  1. Pt will have decreased average leg pain from a 4/10 to <2/10 after 4 weeks.   2. Pt will be independent with HEP after 4 weeks.   3. Pt will be able to centralize symptoms independently after 4 weeks.   4. Pt will follow-up with MD regarding vitamin D defiency within 4 weeks.   5. Additional goals to be determined following additional evaluation.    Long Term Goals:   1. Pt will be able to get dressed, put on socks and shoes without difficulty with  proper modifications after 6 weeks.   2. Pt will demonstrate proper technique for getting in and out of the car without pain after 6 weeks.     Treatment Plan:   ** Stretching/ROM Exercise  ** Therapeutic Exercise  ** Home Exercise Program  ** Soft Tissue Mobilization  ** Hot/Cold Rx  ** Manual Traction  ** Functional Activities  ** Patient  Education      Recommend Physical Therapy be continued 1-2 times per week for 4 weeks.  The rehabilitation potential for this patient is fair secondary to difficulty discerning pain origin and multiple reports of pain throughout body.      Patient is agreeable to treatment plan and is aware of attendance policy.    Clide Dales. Khaila Velarde, DPT    .  .  .  [name]

## 2008-08-26 ENCOUNTER — Telehealth (HOSPITAL_BASED_OUTPATIENT_CLINIC_OR_DEPARTMENT_OTHER): Payer: Self-pay

## 2008-08-26 DIAGNOSIS — E559 Vitamin D deficiency, unspecified: Secondary | ICD-10-CM

## 2008-08-26 MED ORDER — VITAMIN D (ERGOCALCIFEROL) 1.25 MG (50000 UT) PO CAPS
ORAL_CAPSULE | ORAL | Status: DC
Start: 2008-08-26 — End: 2009-05-19

## 2008-08-26 NOTE — Telephone Encounter (Signed)
Pt on Calcium  Carb , Vit D 600-400 twice/day. Since 12/18/07.    Please advise. Low Vit D on 07/10/08 see result bellow.        Abnormal  Final result (07/10/2008 1:21 PM)  Reviewed                   Component Results                           Component  Value  Flag  Low  High  Units  Status      VITAMIN D,25-HYDROXY  15.0  LL  30.0  100.0  ng/ml  Fin                       Lab and Collection                            VITAMIN D,25 HYDROXY (Order #[id number]) on 06/30/08 - Lab and Collection

## 2008-08-26 NOTE — Telephone Encounter (Signed)
Call to patient to notify of new prescription  No answer. Il eft a voice message on patient machine to inform her.  Will try calling patient again tomorrow

## 2008-08-26 NOTE — Telephone Encounter (Signed)
Staff Message copied by Ova Freshwater on Wed Aug 26, 2008 11:41 AM  ------   Message from: Murvin Donning   Created: Wed Aug 26, 2008 10:03 AM   Regarding: requesting a Rx for Vitamin D    [facility]    Deborah [name] [id number], 43 year old, female, Telephone Information:   Home Phone [phone]  Work Phone [phone]      [name] NUMBER: [phone]  Best time to call back: anytime  Cell phone:   Other phone:    Available times:    Patient's language of care: [place]    Patient does not need an interpreter.    Patient's PCP: Gwenith Spitz, MD    Person calling on behalf of patient: Patient (self)    Calls today to speak to provider to requesting a Vitamin by her pcp. Pt said she saw the physical Therapy Yesterday and they found her vitamin D level very low.      Thanks      Patient's Preferred Pharmacy:   [facility] ([facility]  Phone: [phone] Fax: [phone]

## 2008-08-26 NOTE — Telephone Encounter (Signed)
Chart reviewed. Pt has vit D deficiency as noted in my 07/01/08 lab letter to her.    1) Pt to initiate vit D 50,000 IU WEEKLY x 12 weeks (rx sent to pharmacy)  2) pt to schedule lab visit 2-3 weeks after starting to check calcium level  3) at next visit with me, I will recheck vit D

## 2008-08-27 ENCOUNTER — Ambulatory Visit (HOSPITAL_BASED_OUTPATIENT_CLINIC_OR_DEPARTMENT_OTHER): Payer: PRIVATE HEALTH INSURANCE | Admitting: Physical Medicine & Rehabilitation

## 2008-08-27 NOTE — Patient Instructions (Signed)
Rehabilitation Treatment Flowsheet    Precautions:    Date: 08/27/08         Initials: DL         Visit #: 1         Time: 60         HEP X         Treatment(s)                 IE completed

## 2008-08-28 NOTE — Telephone Encounter (Signed)
Staff Message copied by Ova Freshwater on Fri Aug 28, 2008 10:07 AM  ------   Message from: Murvin Donning   Created: Fri Aug 28, 2008 9:26 AM   Regarding: Returning phonecall TO THE NURSE     [facility]    Jamyrah Saur [id number], 43 year old, female, Telephone Information:   Home Phone [phone]  Work Phone [phone]      CALL BACK NUMBER:[phone]  Best time to call back: ANYTIME  Cell phone:   Other phone:    Available times:    Patient's language of care: [place]    Patient does not need an interpreter.    Patient's PCP: Gwenith Spitz, MD    Person calling on behalf of patient: Patient (self)    Calls today Returning phonecall TO THE NURSE .    THANKS        Patient's Preferred Pharmacy:   [facility] ([facility]  Phone: [phone] Fax: [phone]

## 2008-08-28 NOTE — Telephone Encounter (Signed)
T/c returned w/ portuguese interpreter . Message from Dr. Maggie Schwalbe given to Pt.  Pt verbalized understanding. Reminded appt with Dr. Maggie Schwalbe on 7/10  Advised to call with any concerns.

## 2008-09-02 ENCOUNTER — Ambulatory Visit (HOSPITAL_BASED_OUTPATIENT_CLINIC_OR_DEPARTMENT_OTHER): Payer: Self-pay | Admitting: Internal Medicine

## 2008-09-09 ENCOUNTER — Ambulatory Visit (HOSPITAL_BASED_OUTPATIENT_CLINIC_OR_DEPARTMENT_OTHER): Payer: PRIVATE HEALTH INSURANCE

## 2008-09-09 DIAGNOSIS — M79606 Pain in leg, unspecified: Secondary | ICD-10-CM

## 2008-09-09 NOTE — Patient Instructions (Signed)
Rehabilitation Treatment Flowsheet    Precautions:    Date: 08/27/08 09/09/08        Initials: DL DL        Visit #: 1 2        Time: 60 30'        HEP X         Treatment(s)                 IE completed Treadmill test  1.  0%, 5%, 9% incline x 5 minutes                Traction in hooklying                  Bridges x 10 reps

## 2008-09-09 NOTE — Progress Notes (Signed)
S: Patient reports 3/10 pain in the posterior knee/calf area.  Fatigue throughout legs since waking up this am. Started taking Vitamin D medication.   O: Refer to Rehabilitation Treatment Flowsheet  A: Pt reported left hip pain when ambulating on treadmill. Symptoms of right posterior knee pain worsened with increased incline. Decr. Incline did not changes symptoms. Trial of manual tx, created increased pain in posterior calf muscle. With palpation pt reports additional increase in pain.  Tx also produced pain in right hip. Pt able to perform bridges. Question if patient should receive an U/S for posterior calf muscle.  Pt is limited in therapy 2/2 to several reports of pain throughout, in various regions of LEs. One etiology of symptoms is not susepcted at this time. Difficulty evaluating/assessing 2/2 to multiple reports.    P: Plan to call MD regarding patient's symptoms and discuss findings prior to additional therapy at this time.  Question appropriateness of treatment. Possible need for U/S based on point tenderness to posterior calf and PMH.

## 2008-09-14 ENCOUNTER — Ambulatory Visit (HOSPITAL_BASED_OUTPATIENT_CLINIC_OR_DEPARTMENT_OTHER): Payer: PRIVATE HEALTH INSURANCE

## 2008-09-18 ENCOUNTER — Ambulatory Visit (HOSPITAL_BASED_OUTPATIENT_CLINIC_OR_DEPARTMENT_OTHER): Payer: PRIVATE HEALTH INSURANCE

## 2008-09-21 ENCOUNTER — Ambulatory Visit (HOSPITAL_BASED_OUTPATIENT_CLINIC_OR_DEPARTMENT_OTHER): Payer: PRIVATE HEALTH INSURANCE

## 2008-09-28 ENCOUNTER — Ambulatory Visit (HOSPITAL_BASED_OUTPATIENT_CLINIC_OR_DEPARTMENT_OTHER): Payer: PRIVATE HEALTH INSURANCE

## 2008-09-28 ENCOUNTER — Ambulatory Visit (HOSPITAL_BASED_OUTPATIENT_CLINIC_OR_DEPARTMENT_OTHER): Payer: PRIVATE HEALTH INSURANCE | Admitting: Physical Medicine & Rehabilitation

## 2008-09-28 DIAGNOSIS — I739 Peripheral vascular disease, unspecified: Secondary | ICD-10-CM

## 2008-09-28 DIAGNOSIS — E559 Vitamin D deficiency, unspecified: Secondary | ICD-10-CM

## 2008-09-28 DIAGNOSIS — M545 Low back pain, unspecified: Secondary | ICD-10-CM

## 2008-09-28 NOTE — Progress Notes (Signed)
.  .  .        This office note has been dictated. Account number [id number]

## 2008-09-29 ENCOUNTER — Encounter (HOSPITAL_BASED_OUTPATIENT_CLINIC_OR_DEPARTMENT_OTHER): Payer: Self-pay | Admitting: Internal Medicine

## 2008-09-30 ENCOUNTER — Ambulatory Visit (HOSPITAL_BASED_OUTPATIENT_CLINIC_OR_DEPARTMENT_OTHER): Payer: Self-pay | Admitting: Physical Medicine & Rehabilitation

## 2008-10-01 LAB — MRI LUMBAR SPINE WO CONTRAST

## 2008-10-02 ENCOUNTER — Ambulatory Visit (HOSPITAL_BASED_OUTPATIENT_CLINIC_OR_DEPARTMENT_OTHER): Payer: PRIVATE HEALTH INSURANCE

## 2008-10-04 LAB — ORTHOPEDIC OFFICE NOTE

## 2008-10-07 ENCOUNTER — Encounter (HOSPITAL_BASED_OUTPATIENT_CLINIC_OR_DEPARTMENT_OTHER): Payer: Self-pay | Admitting: Internal Medicine

## 2008-10-07 ENCOUNTER — Encounter (HOSPITAL_BASED_OUTPATIENT_CLINIC_OR_DEPARTMENT_OTHER): Payer: Self-pay | Admitting: Physical Medicine & Rehabilitation

## 2008-10-07 ENCOUNTER — Ambulatory Visit (HOSPITAL_BASED_OUTPATIENT_CLINIC_OR_DEPARTMENT_OTHER): Payer: PRIVATE HEALTH INSURANCE | Admitting: Physical Medicine & Rehabilitation

## 2008-10-07 DIAGNOSIS — M5136 Other intervertebral disc degeneration, lumbar region: Secondary | ICD-10-CM

## 2008-10-07 DIAGNOSIS — M6530 Trigger finger, unspecified finger: Secondary | ICD-10-CM

## 2008-10-07 DIAGNOSIS — M545 Low back pain, unspecified: Secondary | ICD-10-CM

## 2008-10-07 DIAGNOSIS — M25559 Pain in unspecified hip: Secondary | ICD-10-CM

## 2008-10-07 DIAGNOSIS — M51369 Other intervertebral disc degeneration, lumbar region without mention of lumbar back pain or lower extremity pain: Secondary | ICD-10-CM

## 2008-10-07 DIAGNOSIS — M76899 Other specified enthesopathies of unspecified lower limb, excluding foot: Secondary | ICD-10-CM

## 2008-10-07 DIAGNOSIS — M7060 Trochanteric bursitis, unspecified hip: Secondary | ICD-10-CM | POA: Insufficient documentation

## 2008-10-07 DIAGNOSIS — E559 Vitamin D deficiency, unspecified: Secondary | ICD-10-CM

## 2008-10-07 HISTORY — DX: Trochanteric bursitis, unspecified hip: M70.60

## 2008-10-07 MED ORDER — LIDOCAINE HCL (LOCAL ANESTH.) 0.5 % IJ SOLN
INTRAMUSCULAR | Status: AC
Start: 2008-10-07 — End: 2008-10-08

## 2008-10-07 MED ORDER — DICLOFENAC SODIUM 75 MG PO TBEC
DELAYED_RELEASE_TABLET | ORAL | Status: AC
Start: 2008-10-07 — End: 2008-12-07

## 2008-10-07 MED ORDER — KENALOG 40 MG/ML IJ SUSP
INTRAMUSCULAR | Status: AC
Start: 2008-10-07 — End: 2008-10-08

## 2008-10-07 NOTE — Progress Notes (Signed)
.  .  .  This office note has been dictated. Account number [id number]  The primary progress note for this visit has been dictated through E-Scription. It can be viewed as an attachment to this encounter or through Chart Review under the Other Tab as an Orthopedic Office Note.      Review of Systems: Constitutional, Eyes, ENT/Mouth, Cardiovascular, Respiratory, GI, GU, Neuro, Psych, Heme/Lymph, Skin, Musculoskeletal was reviewed and is NEGATIVE except for what is dictated in the note.    MT ACCT #:  [id number] PATIENT/PROCEDURE VERIFICATION DOCUMENTATION    Correct patient: Yes  Correct procedure: Yes  Correct site, mark visible if applicable: Yes    Pre-procedure Vital Signs:  BP: N/A  P: N/A  R: N/A    Risks and Benefits reviewed: Yes  Side: Bilateral  Correct position: Yes  Special equipment/implant(s) present, if applicable: Yes    Post-procedure Vitals Signs:  BP: N/A  P: N/A  R: N/A    MEDICATION DOSE VOLUME   Xylocaine 1% - epinephrine free ------------- Ml    Bupivicaine 0.5% - epinephrine free ------------- Ml5.5   Depo-medrol 20mg /ml mg ml   Depo-medrol 40mg /ml mg ml   Depo-medrol 80mg /ml mg ml   Kenalog 40mg /ml mg Ml 0.75   Dexamethasone 10mg /ml mg ml   Other:  mg ml       Time-out completed, documented by provider doing procedure or designated team member:  Kristell Wooding A. Danelle Earthly, MD    10/07/2008    4:05 PM

## 2008-10-13 ENCOUNTER — Ambulatory Visit (HOSPITAL_BASED_OUTPATIENT_CLINIC_OR_DEPARTMENT_OTHER): Payer: PRIVATE HEALTH INSURANCE | Admitting: Internal Medicine

## 2008-10-13 LAB — ORTHOPEDIC OFFICE NOTE

## 2008-10-30 ENCOUNTER — Ambulatory Visit (HOSPITAL_BASED_OUTPATIENT_CLINIC_OR_DEPARTMENT_OTHER): Payer: PRIVATE HEALTH INSURANCE | Admitting: Rehabilitative and Restorative Service Providers"

## 2008-11-04 ENCOUNTER — Ambulatory Visit (HOSPITAL_BASED_OUTPATIENT_CLINIC_OR_DEPARTMENT_OTHER): Payer: PRIVATE HEALTH INSURANCE | Admitting: Physical Medicine & Rehabilitation

## 2008-11-23 ENCOUNTER — Ambulatory Visit (HOSPITAL_BASED_OUTPATIENT_CLINIC_OR_DEPARTMENT_OTHER): Payer: PRIVATE HEALTH INSURANCE

## 2008-11-23 DIAGNOSIS — M545 Low back pain, unspecified: Secondary | ICD-10-CM

## 2008-11-23 NOTE — Patient Instructions (Addendum)
Rehabilitation Treatment Flowsheet    Precautions:    Date: 11/23/08         Initials: DM         Visit #: 1         Time:          HEP          Treatment(s)            Review HEP   Bridging, SLR 3-way, self traction

## 2008-11-23 NOTE — Progress Notes (Signed)
OUTPATIENT EVALUATION    REFERRING PROVIDER: Magaly A. Danelle Earthly, MD  [address]  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 Low back pain x3 years or more per pt. No pain while patient is moving around and working, increases at end of the day while resting. Pain and "tiredness" also increases with stairs. Pain began after episode of tachycardia with ED admit 3/08. Pt trialed PT without relief 5/10. MRI showed lumbar disc degeneration, nothing significant in regards to s/s.    MENTAL STATUS/COMMUNICATION: WNL     PRIMARY LANGUAGE: [place]     REQUIRES INTERPRETER: Yes   INTERPRETER PRESENT DURING EVAL: Yes   DRESSING/GROOMING: WNL     DRIVING: IMPAIRED: pain while driving home after work     SLEEPING: IMPAIRED: pain at night, wakes up in pain (in B calf's)     POSTURE/ALIGNMENT: R hip higher, poor postural awareness.      PELVIC OBLIQUITY: R anterior inominate. L sacral rotation.     LUMBAR AROM: low back pain with flexion and extension end range. sidebending WFL.     MUSCLE FLEXIBILITY: decreased gastroc B and hamstrings.      NEUROLOGICAL: WNL     PALPATION: TTP B PSIS, lumbar paraspinals, L greater trochanter, B gastroc muscle belly     GAIT: B pronation     SKIN INTEGRITY: DRY & INTACT       Please Note: Only populated fields were assessed by provider, fields left blank were not assessed.    GIRTH LEFT RIGHT GIRTH LEFT RIGHT GIRTH LEFT RIGHT                           OTHER:     LEFT RIGHT  LEFT RIGHT   SHOULD A/PROM MMT A/PROM MMT HIP A/PROM MMT A/PROM MMT   FLEX.     FLEX.  3/5  3/5   EXT.     EXT.  3/5  3/5   IR     IR       ER     ER       ABD     ABD  3/5  3/5   H. ABD     ADD       H. ADD     KNEE       ELBOW     FLEX  3+/5  3+/5   FLEX.     EXT  4/5  4/5   EXT.     ANKLE       WRIST     DF  4/5  4/5   FLEX.     PF  4/5*  4/5*   EXT.     IV       SUP/  PRON     EV          SPECIAL TEST LEFT RIGHT SPECIAL TEST LEFT RIGHT  SPECIAL TEST LEFT RIGHT   Slump  + + SLR - -                              Physical Therapy Plan of Care    UE:AVWUJW Maggie Schwalbe, MD  Referring Provider: Danelle Earthly   Diagnosis: 724.2U Lower Back Pain  (primary encounter diagnosis)    Assessment/Objective Findings: Patient is a 43 year old female who reports  gradual onset of low back and B LE pain, chronic in nature. Pt reports increased pain at end of the day, driving home from work, negotiating stairs and sleeping. Pain is 8/10 on average. Pt denies radicular s/s. Pt presents with positive slump test B, poor core and hip stability and flexibility resulting in pain in her bilateral Lumbar spine.        Pain, Decreased Strength, Decreased Functional Mobility, Decreased Joint Mobility and Decreased Tolerance of ADLs  Patient will benefit from skilled PT to address the rehabilitation goals outlined below.    Rehabilitation Goals  Pt. will demonstrate symmetrical pelvic alignment.  Duration: 4 weeks  Pt. will report centralization of radiculopathy by 25% in right lower extremity.    Duration: 4 weeks  Pt. will demonstrate increased postural awareness without cues.   Duration: 4 weeks  Pt. will increased B LE strength by 1 MMT grade.   Duration: 4 weeks  Average low back pain will be reduced from a 8/10 to a 4/10. Duration: 4 weeks  Patient to be Independent with Home Exercise Program.  Duration: 2 weeks    Long Term Goal: Patient wishes to walk, stair climb painfree    Treatment Plan:   ** Stretching/ROM Exercise  ** Therapeutic Exercise  ** Home Exercise Program  ** Joint Mobilization  ** Soft Tissue Mobilization  ** Hot/Cold Rx  ** Manual Traction  ** Functional Activities  ** Patient Education    Recommend Physical Therapy be continued 2 times per week for 4-6 weeks.  The rehabilitation potential for this patient is good    Patient is agreeable to treatment plan and is aware of attendance policy.    Darci Akyah Lagrange MSPT

## 2008-12-04 ENCOUNTER — Ambulatory Visit (HOSPITAL_BASED_OUTPATIENT_CLINIC_OR_DEPARTMENT_OTHER): Payer: PRIVATE HEALTH INSURANCE

## 2008-12-07 ENCOUNTER — Ambulatory Visit (HOSPITAL_BASED_OUTPATIENT_CLINIC_OR_DEPARTMENT_OTHER): Payer: PRIVATE HEALTH INSURANCE | Admitting: Rehabilitative and Restorative Service Providers"

## 2008-12-07 DIAGNOSIS — M545 Low back pain, unspecified: Secondary | ICD-10-CM

## 2008-12-07 NOTE — Patient Instructions (Addendum)
Rehabilitation Treatment Flowsheet    Precautions:    Date: 11/23/08 12/07/08        Initials: DM csb        Visit #: 1 2        Time:          HEP          Treatment(s)  4/10          Review HEP   Bridging, SLR 3-way, self traction reviewed              R. Bike  5'              Leg press  3 pls  3 x 10              Body mechs   lifting              Fitter black  Flex RLE  10x;  Blue  / LLE 10x;  RLE  Blue  Flex, ex  10 x               Resisted gait  #65 5 x  attempted #80

## 2008-12-07 NOTE — Progress Notes (Signed)
S:  Pt reports compliance c HEP 2x daily.  O:  See flow sheet  A: Pt presents c tight ms in BLE and weak core ms.  Pt demo'd poor body mechs. Attempted educating pt on proper body mechs for lifting, but has difficulty d/t poor leg strength.  Performed LE strengthening to allow proper body mechs.  P:  Continue LE strengthening and body mechs instruc.  Continue per PT POC.

## 2008-12-11 ENCOUNTER — Ambulatory Visit (HOSPITAL_BASED_OUTPATIENT_CLINIC_OR_DEPARTMENT_OTHER): Payer: PRIVATE HEALTH INSURANCE

## 2008-12-11 DIAGNOSIS — M545 Low back pain, unspecified: Secondary | ICD-10-CM

## 2008-12-11 NOTE — Progress Notes (Signed)
S: Pt. Reports pain is in R low BAck and R Buttock. Pt. Relates she experienced radicular symptoms down back pf R LE to R Calf yesterday.  O: Pt. Seen for 30 mins for RX. As per flow sheet  A: Pt's LBP seems due mostly to irritation/malalignment RSacroilliac joint of R Illiac downslip, with significant guarding R Piriformis muscle, compressing R Sciatic nerve, resulting in radicular symptoms. Pt. educat on proper supine to sit transfer technique. Pt. Appears to be progressing towards goal 5  P: Con't with present treatment

## 2008-12-11 NOTE — Patient Instructions (Addendum)
Rehabilitation Treatment Flowsheet    Precautions:    Date: 11/23/08 12/07/08 12/11/08       Initials: [facility]       Visit #: 1 2 3        Time:   30 mins       HEP          Treatment(s)  4/10 5/10         Review HEP   Bridging, SLR 3-way, self traction reviewed Ultrasound, con't 1 MHZ, R L-S Spine, X 8 mins             R. Bike  5' R Piriformis stretch, 2x 30 sec              Leg press  3 pls  3 x 10 PAssive stretch R Hip flexor, 2 x 30 sec              Body mechs   lifting Strain-Counterstrain to correct R Illiac downslip, 1 X 90 sec                 Fitter black  Flex RLE  10x;  Blue  / LLE 10x;  RLE  Blue  Flex, ex  10 x  Poster Pelvic Tilts, 10 x 2 5 sec hold             Resisted gait  #65 5 x  attempted #80 Bridging, 10 x 1 3 sec hold

## 2008-12-18 ENCOUNTER — Ambulatory Visit (HOSPITAL_BASED_OUTPATIENT_CLINIC_OR_DEPARTMENT_OTHER): Payer: PRIVATE HEALTH INSURANCE

## 2008-12-18 DIAGNOSIS — M545 Low back pain, unspecified: Secondary | ICD-10-CM

## 2008-12-18 NOTE — Patient Instructions (Addendum)
Rehabilitation Treatment Flowsheet    Precautions:    Date: 11/23/08 12/07/08 12/11/08 12/18/08      Initials: [facility]      Visit #: 1 2 3 4       Time:   30 mins 22 mins      HEP          Treatment(s)  4/10 5/10 0/10- Treatment shortened due to pt. Arriving 10 mins late        Review HEP   Bridging, SLR 3-way, self traction reviewed Ultrasound, con't 1 MHZ, R L-S Spine, X 8 mins X            R. Bike  5' R Piriformis stretch, 2x 30 sec  X            Leg press  3 pls  3 x 10 PAssive stretch R Hip flexor, 2 x 30 sec  X            Body mechs   lifting Strain-Counterstrain to correct R Illiac downslip, 1 X 90 sec                 Fitter black  Flex RLE  10x;  Blue  / LLE 10x;  RLE  Blue  Flex, ex  10 x  Poster Pelvic Tilts, 10 x 2 5 sec hold X            Resisted gait  #65 5 x  attempted #80 Bridging, 10 x 1 3 sec hold

## 2008-12-18 NOTE — Progress Notes (Signed)
S: "I haven't had any pain the past 2 days. It has felt better since coming here last time"  O: Pt. Seen for 22 mins for RX. As per flow sheet  A: Pt. Presents with normal alignment R Sacrolliac joint today. Pt. Reports no longer experiencing pain down R LE. Thus has achieved goals 2 and 5.  P: Focus treatment on Core strength/stability. Re-Evaluate on 12/25/08

## 2008-12-21 ENCOUNTER — Ambulatory Visit (HOSPITAL_BASED_OUTPATIENT_CLINIC_OR_DEPARTMENT_OTHER): Payer: PRIVATE HEALTH INSURANCE | Admitting: Rehabilitative and Restorative Service Providers"

## 2008-12-21 ENCOUNTER — Ambulatory Visit (HOSPITAL_BASED_OUTPATIENT_CLINIC_OR_DEPARTMENT_OTHER): Payer: PRIVATE HEALTH INSURANCE | Admitting: Internal Medicine

## 2008-12-21 DIAGNOSIS — M545 Low back pain, unspecified: Secondary | ICD-10-CM

## 2008-12-21 DIAGNOSIS — I471 Supraventricular tachycardia, unspecified: Secondary | ICD-10-CM

## 2008-12-21 NOTE — Patient Instructions (Signed)
Rehabilitation Treatment Flowsheet    Precautions:    Date: 11/23/08 12/07/08 12/11/08 12/18/08 12/21/08     Initials: [facility]     Visit #: 1 2 3 4 5      Time:   30 mins 22 mins 35     HEP          Treatment(s)  4/10 5/10 0/10- Treatment shortened due to pt. Arriving 10 mins late        Review HEP   Bridging, SLR 3-way, self traction reviewed Ultrasound, con't 1 MHZ, R L-S Spine, X 8 mins X x           R. Bike  5' R Piriformis stretch, 2x 30 sec  X x           Leg press  3 pls  3 x 10 PAssive stretch R Hip flexor, 2 x 30 sec  X x           Body mechs   lifting Strain-Counterstrain to correct R Illiac downslip, 1 X 90 sec      x           Fitter black  Flex RLE  10x;  Blue  / LLE 10x;  RLE  Blue  Flex, ex  10 x  Poster Pelvic Tilts, 10 x 2 5 sec hold X Manual lumbar tx  8'           Resisted gait  #65 5 x  attempted #80 Bridging, 10 x 1 3 sec hold  x

## 2008-12-21 NOTE — Progress Notes (Signed)
.  .  .        xx

## 2008-12-21 NOTE — Progress Notes (Signed)
S:  Pt reports L hip pain & L hip fatigue today n- worse in AM or p prolonged sitting and that LB is feeling better.  O:  See flow sheet  A:  Pt continues to present c weak core and fatigued easily during bridges.  Pt demos understanding of all stretches.  Instructed pt in TA Bracing, pt requires verbal and tatile cueing.  P:  Continue per PT poc

## 2008-12-25 ENCOUNTER — Ambulatory Visit (HOSPITAL_BASED_OUTPATIENT_CLINIC_OR_DEPARTMENT_OTHER): Payer: PRIVATE HEALTH INSURANCE

## 2008-12-25 DIAGNOSIS — M545 Low back pain, unspecified: Secondary | ICD-10-CM

## 2008-12-25 NOTE — Patient Instructions (Addendum)
Rehabilitation Treatment Flowsheet    Precautions:    Date: 11/23/08 12/07/08 12/11/08 12/18/08 12/21/08 12/25/08    Initials: [facility]    Visit #: 1 2 3 4 5 6     Time:   30 mins 22 mins 35 30 min    HEP          Treatment(s)  4/10 5/10 0/10- Treatment shortened due to pt. Arriving 10 mins late  Re-Evaluation DOne- Begin B HAmstring/B Hip flexor Stretching, Begin B Hip Abd/Ext Strengthening, Begin Lumbar Mechanical Traction and muscle energy to correct R Posterior Inominate.         Review HEP   Bridging, SLR 3-way, self traction reviewed Ultrasound, con't 1 MHZ, R L-S Spine, X 8 mins X x           R. Bike  5' R Piriformis stretch, 2x 30 sec  X x           Leg press  3 pls  3 x 10 PAssive stretch R Hip flexor, 2 x 30 sec  X x           Body mechs   lifting Strain-Counterstrain to correct R Ill[place] downslip, 1 X 90 sec      x           Fitter black  Flex RLE  10x;  Blue  / LLE 10x;  RLE  Blue  Flex, ex  10 x  Poster Pelvic Tilts, 10 x 2 5 sec hold X Manual lumbar tx  8'           Resisted gait  #65 5 x  attempted #80 Bridging, 10 x 1 3 sec hold  x

## 2008-12-25 NOTE — Progress Notes (Signed)
.    Physical Therapy Re-Evaluation    [id number] Deborah Schwalbe, MD  Referring Provider: Lennart Pall, MD  Diagnosis: 724.2 Lumbago    Assessment/Objective Findings: Patient is a 43 year old female who has been seen for 6 visits and has made min/mod progress, as pt. With min/mod decreased pain level, min/mod increased Trunk AROM, min increased strength B LE's, with less malalignment R Sacroilliac joint. Findings as follows: PAIN: Now- 0/10, Worst- 2/10, DRIVE/SLEEP/TRANSFERS: Min Pain, STAIRS: No PAin, AROM: LRot- 2/3 full, rRot- 3/4 full, LSB- 100% full, RSB- 7/8 full, FB- 7/8 full, BB- 1/2- 2/3 full, STRE$NGTH: B Hip Flex- 4-/5, L Hip Ext- 4/5, R- 3+/5, L Hip Abd- 4-/5, R- 3+/5, L Knee Ext- 4+/5, R- 5/5, B ANkle DF- 5/5, B Knee Flex- 3/5, FLEXIBILITY: LSLR- 55, RSLR- 51, L Piriformis- min/mod +, R- mod +, B Thomas Test- Slight +, SPECIAL TESTS: (-) B Lumbar Compression/(-) B SI Provocation, Leg Lengths: Supine- L = R, Long Sit- L slight shorter R, PALPATION: Min tender R PSIS, Mod tender R Lumbar FAcet joints, Mod guarding R Lumbar PAraspinals. Pt. Has achieved goals 2, 5, and 6, with some progress toward goals 1, 3, and 4. Pt.   currently presents with pain in her right Lumbar spine.        Pain, Decreased ROM, Decreased Strength and Decreased Functional Mobility  Patient will benefit from skilled PT to address the rehabilitation goals outlined below.    Rehabilitation Goals  Pt. will demonstrate increased L-spine ROM to 7/8 full throughout.  Duration: 4 weeks  Average low back pain will be reduced from a 2/10 to a 0/10. Duration: 4 weeks  Patient will demonstrate normal Alignment R Sacroilliac Joint. Duration: 4 Weeks  Pt. Will demonstrate increased flexibility B Hips 50%, ANd B Hamstrings 10 Duration: 4 Weeks  Pt. Will demonstrate increased B Hip/Hamstring strength 1/2 grade. Duration: 4 Weeks  Pt. Will demonstrate decreased muscle guarding to min. Duration: 4 Weeks  Pt. Will demonstrate less irritation B Lumbar Facet  joints, ie., min TTP. Duration: 4 Weeks  Pt. Will demonstrate no pain/difficulty with Sleep/Transfers. Duration: 4 Weeks  Patient to be Independent with Home Exercise Program.  Duration: 2 weeks  Long Term Goal: To have less pain with Driving    Treatment Plan: ** Stretching/ROM Exercise  ** Therapeutic Exercise  ** Home Exercise Program  ** Joint Mobilization  ** Ultrasound  ** Mechanical Traction  ** Functional Activities  ** Patient Education  **[employer]    Recommend Physical Therapy be continued 2 times per week for 4 weeks.  The rehabilitation potential for this patient is good    Patient is agreeable to treatment plan and is aware of attendance policy.    Deborah [name]

## 2008-12-30 ENCOUNTER — Encounter (HOSPITAL_BASED_OUTPATIENT_CLINIC_OR_DEPARTMENT_OTHER): Payer: Self-pay | Admitting: Physical Medicine & Rehabilitation

## 2008-12-30 ENCOUNTER — Ambulatory Visit (HOSPITAL_BASED_OUTPATIENT_CLINIC_OR_DEPARTMENT_OTHER): Payer: PRIVATE HEALTH INSURANCE | Admitting: Rehabilitative and Restorative Service Providers"

## 2008-12-30 ENCOUNTER — Ambulatory Visit (HOSPITAL_BASED_OUTPATIENT_CLINIC_OR_DEPARTMENT_OTHER): Payer: PRIVATE HEALTH INSURANCE | Admitting: Physical Medicine & Rehabilitation

## 2008-12-30 DIAGNOSIS — M51379 Other intervertebral disc degeneration, lumbosacral region without mention of lumbar back pain or lower extremity pain: Secondary | ICD-10-CM

## 2008-12-30 DIAGNOSIS — M545 Low back pain, unspecified: Secondary | ICD-10-CM

## 2008-12-30 DIAGNOSIS — R5383 Other fatigue: Secondary | ICD-10-CM

## 2008-12-30 DIAGNOSIS — F17201 Nicotine dependence, unspecified, in remission: Secondary | ICD-10-CM

## 2008-12-30 DIAGNOSIS — M7918 Myalgia, other site: Secondary | ICD-10-CM

## 2008-12-30 DIAGNOSIS — M5137 Other intervertebral disc degeneration, lumbosacral region: Secondary | ICD-10-CM

## 2008-12-30 NOTE — Progress Notes (Signed)
S; pt reports 5-6/10 pain prior to rx   O: rx as per flow sheet  A; pt has significant LE/HIP tightness with stretching with decreased tolerance holding stretch >20 seconds- initiated stretching to HS/hip flexors as per PT re- eval, also initiated hip abd/hip ext strengthening ex's with fatigue noted- trial of mech traction with setting noted on flow sheet  P: continue with current program per PT re-eval and pts tolerance with activities

## 2008-12-30 NOTE — Progress Notes (Signed)
This office note has been dictated. Account number [id number]

## 2008-12-30 NOTE — Patient Instructions (Addendum)
Rehabilitation Treatment Flowsheet    Precautions:    Date: 11/23/08 12/07/08 12/11/08 12/18/08 12/21/08 12/25/08 12/30/08   Initials: [facility] c[facility]   Visit #: 1 2 3 4 5 6 7    Time:   30 mins 22 mins 35 30 min    HEP          Treatment(s)  4/10 5/10 0/10- Treatment shortened due to pt. Arriving 10 mins late  Re-Evaluation DOne- Begin B HAmstring/B Hip flexor Stretching, Begin B Hip Abd/Ext Strengthening, Begin Lumbar Mechanical Traction and muscle energy to correct R Posterior Inominate.         Review HEP   Bridging, SLR 3-way, self traction reviewed Ultrasound, con't 1 MHZ, R L-S Spine, X 8 mins X x  Bike         R. Bike  5' R Piriformis stretch, 2x 30 sec  X x  Manual hamstring /manual hip flexor stretching         Leg press  3 pls  3 x 10 PAssive stretch R Hip flexor, 2 x 30 sec  X x  sidelying hip abd 3x10          Body mechs   lifting Strain-Counterstrain to correct R Ill[place] downslip, 1 X 90 sec      x  Prone hip ext 3x10          Fitter black  Flex RLE  10x;  Blue  / LLE 10x;  RLE  Blue  Flex, ex  10 x  Poster Pelvic Tilts, 10 x 2 5 sec hold X Manual lumbar tx  8'  mechanical tracton 65/40   40/10 rests x78min         Resisted gait  #65 5 x  attempted #80 Bridging, 10 x 1 3 sec hold  x  gastroc stretch with wedge                                                                     Rehabilitation Treatment Flowsheet    Precautions:    Date: 11/23/08 12/07/08 12/11/08 12/18/08 12/21/08 12/25/08    Initials: [facility]    Visit #: 1 2 3 4 5 6     Time:   30 mins 22 mins 35 30 min    HEP          Treatment(s)  4/10 5/10 0/10- Treatment shortened due to pt. Arriving 10 mins late  Re-Evaluation DOne- Begin B HAmstring/B Hip flexor Stretching, Begin B Hip Abd/Ext Strengthening, Begin Lumbar Mechanical Traction and muscle energy to correct R Posterior Inominate.         Review HEP   Bridging, SLR 3-way, self traction reviewed Ultrasound, con't 1 MHZ, R L-S Spine, X 8 mins X x           R. Bike  5' R Piriformis  stretch, 2x 30 sec  X x           Leg press  3 pls  3 x 10 PAssive stretch R Hip flexor, 2 x 30 sec  X x           Body mechs   lifting Strain-Counterstrain to correct R Illiac downslip,  1 X 90 sec      x           Fitter black  Flex RLE  10x;  Blue  / LLE 10x;  RLE  Blue  Flex, ex  10 x  Poster Pelvic Tilts, 10 x 2 5 sec hold X Manual lumbar tx  8'           Resisted gait  #65 5 x  attempted #80 Bridging, 10 x 1 3 sec hold  x

## 2009-01-01 ENCOUNTER — Ambulatory Visit (HOSPITAL_BASED_OUTPATIENT_CLINIC_OR_DEPARTMENT_OTHER): Payer: PRIVATE HEALTH INSURANCE

## 2009-01-01 DIAGNOSIS — M545 Low back pain, unspecified: Secondary | ICD-10-CM

## 2009-01-01 NOTE — Progress Notes (Signed)
S: "It is better today. I had a lot of pain after the last treatment"  O: Pt. Seen for 30 mins for RX. As per flow sheet  A: Pt. Cont's to present with R Posterior Inominate which did correct fully with muscle energy. Pt's increased pain after last treatment ? Due to Mechanical Traction, thus this not performed today. Pt. With anterior pelvic pain with Piriformis stretching, ? Due to decreased flexibility R Hip flexors, and ? Pelvic Malaignment.   P: Instruct in HEP B Hip flexor stretching next session

## 2009-01-01 NOTE — Patient Instructions (Addendum)
Rehabilitation Treatment Flowsheet    Precautions:    Date: 01/01/09         Initials: [facility]         Visit #: 8         Time:          HEP          Treatment(s) 1-2/10                Ultrasound, con't 1 MHZ, R L-S Spine, X 8 mins                DKC Stretch, 2 x 30 sec                 B Hamstring stretch stand, 2X B 30 sec                 PAssive stretch B Hip Flexors, 2 x 30 sec each                   Muscle Energy to correct R Posterior Inominate, 5X 20 sec hold                POster Pelvic Tilts. 10 x 2 5 sec hold                                                           Rehabilitation Treatment Flowsheet    Precautions:    Date: 11/23/08 12/07/08 12/11/08 12/18/08 12/21/08 12/25/08 12/30/08   Initials: DM csb [facility] CH csb CH RA   Visit #: 1 2 3 4 5 6 7    Time:   30 mins 22 mins 35 30 min    HEP          Treatment(s)  4/10 5/10 0/10- Treatment shortened due to pt. Arriving 10 mins late  Re-Evaluation DOne- Begin B HAmstring/B Hip flexor Stretching, Begin B Hip Abd/Ext Strengthening, Begin Lumbar Mechanical Traction and muscle energy to correct R Posterior Inominate.         Review HEP   Bridging, SLR 3-way, self traction reviewed Ultrasound, con't 1 MHZ, R L-S Spine, X 8 mins X x  Bike         R. Bike  5' R Piriformis stretch, 2x 30 sec  X x  Manual hamstring /manual hip flexor stretching         Leg press  3 pls  3 x 10 PAssive stretch R Hip flexor, 2 x 30 sec  X x  sidelying hip abd 3x10          Body mechs   lifting Strain-Counterstrain to correct R [place] downslip, 1 X 90 sec      x  Prone hip ext 3x10          Fitter black  Flex RLE  10x;  Blue  / LLE 10x;  RLE  Blue  Flex, ex  10 x  Poster Pelvic Tilts, 10 x 2 5 sec hold X Manual lumbar tx  8'  mechanical tracton 65/40   40/10 rests x40min         Resisted gait  #65 5 x  attempted #80 Bridging, 10 x 1 3 sec hold  x  gastroc stretch with wedge  Rehabilitation Treatment  Flowsheet    Precautions:    Date: 11/23/08 12/07/08 12/11/08 12/18/08 12/21/08 12/25/08    Initials: [facility] c[facility]    Visit #: 1 2 3 4 5 6     Time:   30 mins 22 mins 35 30 min    HEP          Treatment(s)  4/10 5/10 0/10- Treatment shortened due to pt. Arriving 10 mins late  Re-Evaluation DOne- Begin B HAmstring/B Hip flexor Stretching, Begin B Hip Abd/Ext Strengthening, Begin Lumbar Mechanical Traction and muscle energy to correct R Posterior Inominate.         Review HEP   Bridging, SLR 3-way, self traction reviewed Ultrasound, con't 1 MHZ, R L-S Spine, X 8 mins X x           R. Bike  5' R Piriformis stretch, 2x 30 sec  X x           Leg press  3 pls  3 x 10 PAssive stretch R Hip flexor, 2 x 30 sec  X x           Body mechs   lifting Strain-Counterstrain to correct R [place] downslip, 1 X 90 sec      x           Fitter black  Flex RLE  10x;  Blue  / LLE 10x;  RLE  Blue  Flex, ex  10 x  Poster Pelvic Tilts, 10 x 2 5 sec hold X Manual lumbar tx  8'           Resisted gait  #65 5 x  attempted #80 Bridging, 10 x 1 3 sec hold  x

## 2009-01-06 ENCOUNTER — Ambulatory Visit (HOSPITAL_BASED_OUTPATIENT_CLINIC_OR_DEPARTMENT_OTHER): Payer: PRIVATE HEALTH INSURANCE

## 2009-01-08 ENCOUNTER — Ambulatory Visit (HOSPITAL_BASED_OUTPATIENT_CLINIC_OR_DEPARTMENT_OTHER): Payer: PRIVATE HEALTH INSURANCE

## 2009-01-08 DIAGNOSIS — M545 Low back pain, unspecified: Secondary | ICD-10-CM

## 2009-01-08 NOTE — Progress Notes (Signed)
S: "I haven't had any pain in the back since Wednesday. I am getting a lot of pain in the back of my legs". Pt. Points to distal posterior thigh and Proximal Calf muscles  O: Pt. Seen for 30 mins for RX. As per flow sheet  A: Pt's increased Posterior LE pain seems due to guarding/decreased flexibility B Hamstring/B GAstroc muscles. Thus pt. Instructed in HEP for stretching these. Pt. Appears to be progressing towards goal 2. Pt. Presents with normal alignment R Sacroilliac joint, thus appears to be progressing towards goal 3  P: Con't with present treatment

## 2009-01-08 NOTE — Patient Instructions (Addendum)
Rehabilitation Treatment Flowsheet    Precautions:    Date: 01/01/09 01/08/09        Initials: [facility]        Visit #: 8 9        Time:          HEP  B HAmstring stretch stand, B Gastroc stretch with towel        Treatment(s) 1-2/10 0/10               Ultrasound, con't 1 MHZ, R L-S Spine, X 8 mins X               DKC Stretch, 2 x 30 sec  X                  B Hamstring stretch stand, 2X B 30 sec  X               PAssive stretch B Hip Flexors, 2 x 30 sec each    X               Muscle Energy to correct R Posterior Inominate, 5X 20 sec hold                POster Pelvic Tilts. 10 x 2 5 sec hold 10 x 1 5 sec hold                B GAstroc stretch with towel, 2x 30 sec                                           Rehabilitation Treatment Flowsheet    Precautions:    Date: 11/23/08 12/07/08 12/11/08 12/18/08 12/21/08 12/25/08 12/30/08   Initials: DM csb CH CH csb CH RA   Visit #: 1 2 3 4 5 6 7    Time:   30 mins 22 mins 35 30 min    HEP          Treatment(s)  4/10 5/10 0/10- Treatment shortened due to pt. Arriving 10 mins late  Re-Evaluation DOne- Begin B HAmstring/B Hip flexor Stretching, Begin B Hip Abd/Ext Strengthening, Begin Lumbar Mechanical Traction and muscle energy to correct R Posterior Inominate.         Review HEP   Bridging, SLR 3-way, self traction reviewed Ultrasound, con't 1 MHZ, R L-S Spine, X 8 mins X x  Bike         R. Bike  5' R Piriformis stretch, 2x 30 sec  X x  Manual hamstring /manual hip flexor stretching         Leg press  3 pls  3 x 10 PAssive stretch R Hip flexor, 2 x 30 sec  X x  sidelying hip abd 3x10          Body mechs   lifting Strain-Counterstrain to correct R [place] downslip, 1 X 90 sec      x  Prone hip ext 3x10          Fitter black  Flex RLE  10x;  Blue  / LLE 10x;  RLE  Blue  Flex, ex  10 x  Poster Pelvic Tilts, 10 x 2 5 sec hold X Manual lumbar tx  8'  mechanical tracton 65/40   40/10 rests x4min         Resisted gait  #65 5 x  attempted #80 Bridging, 10 x 1 3 sec hold  x  gastroc stretch with  wedge                                                                     Rehabilitation Treatment Flowsheet    Precautions:    Date: 11/23/08 12/07/08 12/11/08 12/18/08 12/21/08 12/25/08    Initials: DM csb CH CH csb CH    Visit #: 1 2 3 4 5 6     Time:   30 mins 22 mins 35 30 min    HEP          Treatment(s)  4/10 5/10 0/10- Treatment shortened due to pt. Arriving 10 mins late  Re-Evaluation DOne- Begin B HAmstring/B Hip flexor Stretching, Begin B Hip Abd/Ext Strengthening, Begin Lumbar Mechanical Traction and muscle energy to correct R Posterior Inominate.         Review HEP   Bridging, SLR 3-way, self traction reviewed Ultrasound, con't 1 MHZ, R L-S Spine, X 8 mins X x           R. Bike  5' R Piriformis stretch, 2x 30 sec  X x           Leg press  3 pls  3 x 10 PAssive stretch R Hip flexor, 2 x 30 sec  X x           Body mechs   lifting Strain-Counterstrain to correct R [place] downslip, 1 X 90 sec      x           Fitter black  Flex RLE  10x;  Blue  / LLE 10x;  RLE  Blue  Flex, ex  10 x  Poster Pelvic Tilts, 10 x 2 5 sec hold X Manual lumbar tx  8'           Resisted gait  #65 5 x  attempted #80 Bridging, 10 x 1 3 sec hold  x

## 2009-01-11 ENCOUNTER — Ambulatory Visit (HOSPITAL_BASED_OUTPATIENT_CLINIC_OR_DEPARTMENT_OTHER): Payer: Self-pay | Admitting: Internal Medicine

## 2009-01-11 ENCOUNTER — Ambulatory Visit: Payer: Self-pay | Admitting: Internal Medicine

## 2009-01-11 DIAGNOSIS — R002 Palpitations: Secondary | ICD-10-CM

## 2009-01-11 LAB — EKG-12 ** TO BE DONE BY EKG **

## 2009-01-13 ENCOUNTER — Ambulatory Visit (HOSPITAL_BASED_OUTPATIENT_CLINIC_OR_DEPARTMENT_OTHER): Payer: PRIVATE HEALTH INSURANCE

## 2009-01-13 DIAGNOSIS — M545 Low back pain, unspecified: Secondary | ICD-10-CM

## 2009-01-13 LAB — MEDICAL SPECIALTIES LETTER

## 2009-01-13 NOTE — Progress Notes (Signed)
S: "Yesterday I went for a really fast walk and I got a lot of pain in the back of both of my legs, more the right one, but my back wasn't painful"  O: Pt. Seen for 20 mins for RX. As per flow sheet  A: Pt. Presents with con't normal alignment R Sacroilliac joint, thus has now achieved goal 3. Pt. Reports performing HAmstring stretches at home after walking with slight relief. Pt. Required min VC's for proper performance of HEP.  P: Try Mechanical Traction next session

## 2009-01-13 NOTE — Patient Instructions (Addendum)
Rehabilitation Treatment Flowsheet    Precautions:    Date: 01/01/09 01/08/09 01/13/09       Initials: [facility] CH       Visit #: 8 9 10        Time:   20 min       HEP  B HAmstring stretch stand, B Gastroc stretch with towel Treatment shortened due to pt. Arriving 10 mins late       Treatment(s) 1-2/10 0/10 0/10              Ultrasound, con't 1 MHZ, R L-S Spine, X 8 mins X X              DKC Stretch, 2 x 30 sec  X    X              B Hamstring stretch stand, 2X B 30 sec  X X              PAssive stretch B Hip Flexors, 2 x 30 sec each    X X              Muscle Energy to correct R Posterior Inominate, 5X 20 sec hold                POster Pelvic Tilts. 10 x 2 5 sec hold 10 x 1 5 sec hold X               B GAstroc stretch with towel, 2x 30 sec                                           Rehabilitation Treatment Flowsheet    Precautions:    Date: 11/23/08 12/07/08 12/11/08 12/18/08 12/21/08 12/25/08 12/30/08   Initials: DM csb CH CH csb CH RA   Visit #: 1 2 3 4 5 6 7    Time:   30 mins 22 mins 35 30 min    HEP          Treatment(s)  4/10 5/10 0/10- Treatment shortened due to pt. Arriving 10 mins late  Re-Evaluation DOne- Begin B HAmstring/B Hip flexor Stretching, Begin B Hip Abd/Ext Strengthening, Begin Lumbar Mechanical Traction and muscle energy to correct R Posterior Inominate.         Review HEP   Bridging, SLR 3-way, self traction reviewed Ultrasound, con't 1 MHZ, R L-S Spine, X 8 mins X x  Bike         R. Bike  5' R Piriformis stretch, 2x 30 sec  X x  Manual hamstring /manual hip flexor stretching         Leg press  3 pls  3 x 10 PAssive stretch R Hip flexor, 2 x 30 sec  X x  sidelying hip abd 3x10          Body mechs   lifting Strain-Counterstrain to correct R [place] downslip, 1 X 90 sec      x  Prone hip ext 3x10          Fitter black  Flex RLE  10x;  Blue  / LLE 10x;  RLE  Blue  Flex, ex  10 x  Poster Pelvic Tilts, 10 x 2 5 sec hold X Manual lumbar tx  8'  mechanical tracton 65/40   40/10 rests x81min  Resisted  gait  #65 5 x  attempted #80 Bridging, 10 x 1 3 sec hold  x  gastroc stretch with wedge                                                                     Rehabilitation Treatment Flowsheet    Precautions:    Date: 11/23/08 12/07/08 12/11/08 12/18/08 12/21/08 12/25/08    Initials: DM csb CH CH csb CH    Visit #: 1 2 3 4 5 6     Time:   30 mins 22 mins 35 30 min    HEP          Treatment(s)  4/10 5/10 0/10- Treatment shortened due to pt. Arriving 10 mins late  Re-Evaluation DOne- Begin B HAmstring/B Hip flexor Stretching, Begin B Hip Abd/Ext Strengthening, Begin Lumbar Mechanical Traction and muscle energy to correct R Posterior Inominate.         Review HEP   Bridging, SLR 3-way, self traction reviewed Ultrasound, con't 1 MHZ, R L-S Spine, X 8 mins X x           R. Bike  5' R Piriformis stretch, 2x 30 sec  X x           Leg press  3 pls  3 x 10 PAssive stretch R Hip flexor, 2 x 30 sec  X x           Body mechs   lifting Strain-Counterstrain to correct R [place] downslip, 1 X 90 sec      x           Fitter black  Flex RLE  10x;  Blue  / LLE 10x;  RLE  Blue  Flex, ex  10 x  Poster Pelvic Tilts, 10 x 2 5 sec hold X Manual lumbar tx  8'           Resisted gait  #65 5 x  attempted #80 Bridging, 10 x 1 3 sec hold  x

## 2009-01-14 LAB — VITAMIN D,25 HYDROXY: VITAMIN D,25 HYDROXY: 21.1 ng/ml — ABNORMAL LOW (ref 30.0–100.0)

## 2009-01-15 ENCOUNTER — Ambulatory Visit (HOSPITAL_BASED_OUTPATIENT_CLINIC_OR_DEPARTMENT_OTHER): Payer: PRIVATE HEALTH INSURANCE

## 2009-01-15 DIAGNOSIS — M545 Low back pain, unspecified: Secondary | ICD-10-CM

## 2009-01-15 NOTE — Patient Instructions (Signed)
Rehabilitation Treatment Flowsheet    Precautions:    Date: 01/01/09 01/08/09 01/13/09 01/15/09      Initials: [facility] CH      Visit #: 8 9 10 11       Time:   20 min 40 mins      HEP  B HAmstring stretch stand, B Gastroc stretch with towel Treatment shortened due to pt. Arriving 10 mins late       Treatment(s) 1-2/10 0/10 0/10 0/10             Ultrasound, con't 1 MHZ, R L-S Spine, X 8 mins X X X             DKC Stretch, 2 x 30 sec  X    X X             B Hamstring stretch stand, 2X B 30 sec  X X X             PAssive stretch B Hip Flexors, 2 x 30 sec each    X X X             Muscle Energy to correct R Posterior Inominate, 5X 20 sec hold                POster Pelvic Tilts. 10 x 2 5 sec hold 10 x 1 5 sec hold X X              B GAstroc stretch with towel, 2x 30 sec   Mechanical Traction, 40/10, 80# on/40# off, X 15 mins                                        Rehabilitation Treatment Flowsheet    Precautions:    Date: 11/23/08 12/07/08 12/11/08 12/18/08 12/21/08 12/25/08 12/30/08   Initials: DM csb CH CH csb CH RA   Visit #: 1 2 3 4 5 6 7    Time:   30 mins 22 mins 35 30 min    HEP          Treatment(s)  4/10 5/10 0/10- Treatment shortened due to pt. Arriving 10 mins late  Re-Evaluation DOne- Begin B HAmstring/B Hip flexor Stretching, Begin B Hip Abd/Ext Strengthening, Begin Lumbar Mechanical Traction and muscle energy to correct R Posterior Inominate.         Review HEP   Bridging, SLR 3-way, self traction reviewed Ultrasound, con't 1 MHZ, R L-S Spine, X 8 mins X x  Bike         R. Bike  5' R Piriformis stretch, 2x 30 sec  X x  Manual hamstring /manual hip flexor stretching         Leg press  3 pls  3 x 10 PAssive stretch R Hip flexor, 2 x 30 sec  X x  sidelying hip abd 3x10          Body mechs   lifting Strain-Counterstrain to correct R [place] downslip, 1 X 90 sec      x  Prone hip ext 3x10          Fitter black  Flex RLE  10x;  Blue  / LLE 10x;  RLE  Blue  Flex, ex  10 x  Poster Pelvic Tilts, 10 x 2 5 sec hold X Manual  lumbar tx  8'  mechanical tracton 65/40  40/10 rests x58min         Resisted gait  #65 5 x  attempted #80 Bridging, 10 x 1 3 sec hold  x  gastroc stretch with wedge                                                                     Rehabilitation Treatment Flowsheet    Precautions:    Date: 11/23/08 12/07/08 12/11/08 12/18/08 12/21/08 12/25/08    Initials: DM csb CH CH csb CH    Visit #: 1 2 3 4 5 6     Time:   30 mins 22 mins 35 30 min    HEP          Treatment(s)  4/10 5/10 0/10- Treatment shortened due to pt. Arriving 10 mins late  Re-Evaluation DOne- Begin B HAmstring/B Hip flexor Stretching, Begin B Hip Abd/Ext Strengthening, Begin Lumbar Mechanical Traction and muscle energy to correct R Posterior Inominate.         Review HEP   Bridging, SLR 3-way, self traction reviewed Ultrasound, con't 1 MHZ, R L-S Spine, X 8 mins X x           R. Bike  5' R Piriformis stretch, 2x 30 sec  X x           Leg press  3 pls  3 x 10 PAssive stretch R Hip flexor, 2 x 30 sec  X x           Body mechs   lifting Strain-Counterstrain to correct R [place] downslip, 1 X 90 sec      x           Fitter black  Flex RLE  10x;  Blue  / LLE 10x;  RLE  Blue  Flex, ex  10 x  Poster Pelvic Tilts, 10 x 2 5 sec hold X Manual lumbar tx  8'           Resisted gait  #65 5 x  attempted #80 Bridging, 10 x 1 3 sec hold  x

## 2009-01-15 NOTE — Progress Notes (Signed)
S: "I am not doing so good this morning. I am getting pain in both of my hands and in my right shoulder"(Pt. Denies any Low BAck pain, with con't pain in Posterior thighs and R Hip)  O: Pt. Seen for 40 mins for RX. As per flow sheet  A: Pt. Cont's to present without any LBP. Pt. Developing apparent multiple myalgias throughout her body, which appear unrelated to her Lumbar Spine dysfunction. Pt. Instructed to address these problems at next MD visit on 02/02/09. Inititaed Mchanical Traction today with good effect. Pt. Reports no significant relief from posterior thigh/calf pain with Hamstring/Gastroc stretching.  P: Re-Evaluate next session with probable discharge and referral back to MD.

## 2009-01-20 ENCOUNTER — Ambulatory Visit (HOSPITAL_BASED_OUTPATIENT_CLINIC_OR_DEPARTMENT_OTHER): Payer: PRIVATE HEALTH INSURANCE

## 2009-01-20 DIAGNOSIS — M545 Low back pain, unspecified: Secondary | ICD-10-CM

## 2009-01-20 NOTE — Progress Notes (Signed)
.    PHYSICAL THERAPY  DISCHARGE REPORT    DIAGNOSIS: Lumbago  MD: Lennart Pall, MD    Your patient, Deborah [name], has been discharged from Physical Therapy after a total of 10 visits.    REASON(S) FOR DISCHARGE:    This patient has: Not MAde Significant Progress with treatment                              Condition has worsened    RECOMMENDATIONS:    This patient should: Follow Up with Referring MD                                   Continue HEP    COMMENTS: Pt. Seen for Re-Evaluation. Pt. Has made slight/min progress since beginning treatment as pt. With slight/min decreased pain level, min increased B LE strength, with now normal alignment R Sacroilliac joint. However, pt. With no improvement in condition since last re-eval on 12/25/08 as pt. Presents with increased pain level, con't unchanged min/mod decreased Trunk AROM, con't unchanged mod decreased flexibility B Hip muscles, with con't significant guarding Lumbar PAraspinals and irritation Lumbar Facet joints, with report of increased pain with transfers and ambulating up/down stairs. Findings as follows: PAIN: Now- 4/10, Worst- 8/10, DRIVE/SLEEP/STAIRS: Min Pain, TRANSFERS: Mod Pain, AROM: LRot/LSB/FB- 7/8 full, RRot/RSB- 3/4 full, BB- 1/2 full, STRENGTH: L Hip Flex- 4-/5, R- 5/5, L Hip Ext- 3+/5, R- 4-/5, L Hip Abd- 4/5, R- 4-/5, B Knee Ext- 5/5, B Knee Flex- 3+/5, L Ankle DF- 4+/5, R- 5/5, SPECIAL TESTS: (-) B Lumbar Compression, Leg Length: = in supine/long sit. PALPATION: Min/mod tender R PSIS/R Lumbar Facet joints, Min/mod guarding R Lumbar PAraspinals. Pt. Has achieved goals 3 and 9, with no significant progress mad toward all other goals.  If you have any questions please feel free to contact the department at Dept: [phone].    Therapist: Corwin Levins

## 2009-01-20 NOTE — Patient Instructions (Addendum)
Rehabilitation Treatment Flowsheet    Precautions:    Date: 01/01/09 01/08/09 01/13/09 01/15/09 01/20/09     Initials: [facility] CH CH CH CH     Visit #: 8 9 10 11 12      Time:   20 min 40 mins 25 min     HEP  B HAmstring stretch stand, B Gastroc stretch with towel Treatment shortened due to pt. Arriving 10 mins late       Treatment(s) 1-2/10 0/10 0/10 0/10             Ultrasound, con't 1 MHZ, R L-S Spine, X 8 mins X X X Re-Evaluation Done- Pt. Is discharged            DKC Stretch, 2 x 30 sec  X    X X             B Hamstring stretch stand, 2X B 30 sec  X X X             PAssive stretch B Hip Flexors, 2 x 30 sec each    X X X             Muscle Energy to correct R Posterior Inominate, 5X 20 sec hold                POster Pelvic Tilts. 10 x 2 5 sec hold 10 x 1 5 sec hold X X              B GAstroc stretch with towel, 2x 30 sec   Mechanical Traction, 40/10, 80# on/40# off, X 15 mins                                        Rehabilitation Treatment Flowsheet    Precautions:    Date: 11/23/08 12/07/08 12/11/08 12/18/08 12/21/08 12/25/08 12/30/08   Initials: DM csb CH CH csb CH RA   Visit #: 1 2 3 4 5 6 7    Time:   30 mins 22 mins 35 30 min    HEP          Treatment(s)  4/10 5/10 0/10- Treatment shortened due to pt. Arriving 10 mins late  Re-Evaluation DOne- Begin B HAmstring/B Hip flexor Stretching, Begin B Hip Abd/Ext Strengthening, Begin Lumbar Mechanical Traction and muscle energy to correct R Posterior Inominate.         Review HEP   Bridging, SLR 3-way, self traction reviewed Ultrasound, con't 1 MHZ, R L-S Spine, X 8 mins X x  Bike         R. Bike  5' R Piriformis stretch, 2x 30 sec  X x  Manual hamstring /manual hip flexor stretching         Leg press  3 pls  3 x 10 PAssive stretch R Hip flexor, 2 x 30 sec  X x  sidelying hip abd 3x10          Body mechs   lifting Strain-Counterstrain to correct R [place] downslip, 1 X 90 sec      x  Prone hip ext 3x10          Fitter black  Flex RLE  10x;  Blue  / LLE 10x;  RLE  Blue  Flex,  ex  10 x  Poster Pelvic Tilts, 10 x 2 5 sec hold X Manual lumbar tx  8'  mechanical tracton 65/40   40/10 rests x105min         Resisted gait  #65 5 x  attempted #80 Bridging, 10 x 1 3 sec hold  x  gastroc stretch with wedge                                                                     Rehabilitation Treatment Flowsheet    Precautions:    Date: 11/23/08 12/07/08 12/11/08 12/18/08 12/21/08 12/25/08    Initials: DM csb CH CH csb CH    Visit #: 1 2 3 4 5 6     Time:   30 mins 22 mins 35 30 min    HEP          Treatment(s)  4/10 5/10 0/10- Treatment shortened due to pt. Arriving 10 mins late  Re-Evaluation DOne- Begin B HAmstring/B Hip flexor Stretching, Begin B Hip Abd/Ext Strengthening, Begin Lumbar Mechanical Traction and muscle energy to correct R Posterior Inominate.         Review HEP   Bridging, SLR 3-way, self traction reviewed Ultrasound, con't 1 MHZ, R L-S Spine, X 8 mins X x           R. Bike  5' R Piriformis stretch, 2x 30 sec  X x           Leg press  3 pls  3 x 10 PAssive stretch R Hip flexor, 2 x 30 sec  X x           Body mechs   lifting Strain-Counterstrain to correct R [place] downslip, 1 X 90 sec      x           Fitter black  Flex RLE  10x;  Blue  / LLE 10x;  RLE  Blue  Flex, ex  10 x  Poster Pelvic Tilts, 10 x 2 5 sec hold X Manual lumbar tx  8'           Resisted gait  #65 5 x  attempted #80 Bridging, 10 x 1 3 sec hold  x

## 2009-01-21 ENCOUNTER — Ambulatory Visit (HOSPITAL_BASED_OUTPATIENT_CLINIC_OR_DEPARTMENT_OTHER): Payer: Self-pay | Admitting: Physical Medicine & Rehabilitation

## 2009-01-22 ENCOUNTER — Ambulatory Visit (HOSPITAL_BASED_OUTPATIENT_CLINIC_OR_DEPARTMENT_OTHER): Payer: PRIVATE HEALTH INSURANCE | Admitting: Rehabilitative and Restorative Service Providers"

## 2009-02-02 ENCOUNTER — Ambulatory Visit (HOSPITAL_BASED_OUTPATIENT_CLINIC_OR_DEPARTMENT_OTHER): Payer: PRIVATE HEALTH INSURANCE | Admitting: Physical Medicine & Rehabilitation

## 2009-02-07 LAB — ORTHOPEDIC OFFICE NOTE

## 2009-02-24 ENCOUNTER — Ambulatory Visit (HOSPITAL_BASED_OUTPATIENT_CLINIC_OR_DEPARTMENT_OTHER): Payer: Self-pay | Admitting: Internal Medicine

## 2009-02-25 ENCOUNTER — Ambulatory Visit (HOSPITAL_BASED_OUTPATIENT_CLINIC_OR_DEPARTMENT_OTHER): Payer: PRIVATE HEALTH INSURANCE | Admitting: Physical Medicine & Rehabilitation

## 2009-02-25 LAB — MA SCREENING MAMMO BILATERAL WITH CAD

## 2009-04-02 ENCOUNTER — Emergency Department (HOSPITAL_BASED_OUTPATIENT_CLINIC_OR_DEPARTMENT_OTHER)
Admission: RE | Admit: 2009-04-02 | Disposition: A | Discharge status: Home / Self Care | Payer: Self-pay | Source: Emergency Department | Attending: Emergency Medicine | Admitting: Emergency Medicine

## 2009-04-02 ENCOUNTER — Encounter (HOSPITAL_BASED_OUTPATIENT_CLINIC_OR_DEPARTMENT_OTHER): Payer: Self-pay

## 2009-04-02 LAB — XR CHEST 2 VIEWS

## 2009-04-02 MED ORDER — PENICILLIN V POTASSIUM 500 MG PO TABS
ORAL_TABLET | ORAL | Status: AC
Start: 2009-04-02 — End: 2009-04-12

## 2009-04-02 MED ORDER — HYDROCODONE-ACETAMINOPHEN 5-500 MG PO TABS
ORAL_TABLET | ORAL | Status: AC
Start: 2009-04-02 — End: 2009-04-09

## 2009-04-02 NOTE — Discharge Instructions (Signed)
Please provide the following to the patient in [place]:    You have strep throat, which is contagious.    Take your antibiotics exactly as prescribed.  Start them today and finish them all, even if you feel better.    Stay out of work today and tomorrow.  Make sure you change your toothbrush in two days so you don't give yourself strep throat again.  Don't share glasses or food with anyone else.    Follow up with your primary care doctor if you are not back to normal in one week.    Please return here immediately if your symptoms change or worsen or if you have any new problems.

## 2009-04-02 NOTE — ED Notes (Signed)
Pt states having fever body aches and back pain rad down left leg beginning last night

## 2009-05-01 LAB — EMERGENCY ROOM NOTE

## 2009-05-19 ENCOUNTER — Encounter (HOSPITAL_BASED_OUTPATIENT_CLINIC_OR_DEPARTMENT_OTHER): Payer: Self-pay | Admitting: Internal Medicine

## 2009-05-19 ENCOUNTER — Ambulatory Visit (HOSPITAL_BASED_OUTPATIENT_CLINIC_OR_DEPARTMENT_OTHER): Payer: Medicaid Other | Admitting: Internal Medicine

## 2009-05-19 VITALS — BP 116/80 | HR 81 | Temp 97.6°F | Resp 20 | Ht 62.0 in | Wt 165.0 lb

## 2009-05-19 DIAGNOSIS — N951 Menopausal and female climacteric states: Secondary | ICD-10-CM

## 2009-05-19 DIAGNOSIS — G5600 Carpal tunnel syndrome, unspecified upper limb: Secondary | ICD-10-CM

## 2009-05-19 DIAGNOSIS — E669 Obesity, unspecified: Secondary | ICD-10-CM

## 2009-05-19 DIAGNOSIS — M6530 Trigger finger, unspecified finger: Secondary | ICD-10-CM

## 2009-05-19 DIAGNOSIS — Z0000 Encounter for general adult medical examination without abnormal findings: Secondary | ICD-10-CM

## 2009-05-19 DIAGNOSIS — E559 Vitamin D deficiency, unspecified: Secondary | ICD-10-CM

## 2009-05-19 DIAGNOSIS — Z124 Encounter for screening for malignant neoplasm of cervix: Secondary | ICD-10-CM

## 2009-05-19 DIAGNOSIS — Z833 Family history of diabetes mellitus: Secondary | ICD-10-CM

## 2009-05-19 MED ORDER — CALCIUM CARBONATE-VIT D-MIN 600-400 MG-UNIT PO CHEW
CHEWABLE_TABLET | ORAL | Status: AC
Start: 2009-05-19 — End: 2010-05-19

## 2009-05-19 NOTE — Patient Instructions (Signed)
[facility]  [facility]  [address]  [place] [address]  Clinical Nutrition Services    Calcium and Your Health    Calcium is a mineral that helps build strong bones and teeth, helps prevent brittle bones (osteoporosis) and hip fractures, and helps maintain a normal blood pressure. It is very important to get enough calcium each day.    * CALCIUM NEEDS AT ALL AGES: (RDI 2000)  Infants birth to 12 months  is 210 -270 mg/day          Child 1-8 years  is 500-.800 mg/day                     Adolescent 9-18years is 1300 mg/day                   Adults 19-30 years is 1000 mg/day  Adults 31-50 years is 1000 mg/day  Adults 51 and older is 1200 mg/day    * TO MAINTAIN GOOD BONE HEALTH, EAT A CALCIUM RICH DIET:    FOODS                                      Yogurt, plain, nonfat: 1 cup serving is 450 mgs of calcium    Ricotta cheese, part skim: 1/2 cup serving is 340 mgs of calcium    Milk, skim: 1 cup serving is 300 mgs of calcium    Milk, whole: 1 cup serving is 290 mgs of calcium    Sardines, canned, with bones: 3 oz serving is 280 mgs of calcium    Yogurt, plain, whole milk:1 cup serving is 275 mgs of calcium    Orange juice, calcium fortified:1 cup serving is 270 mgs of calcium    Swiss cheese: 1 oz serving is 270 mgs of calcium    Spinach, cooked: 1 cup serving is 240 mgs of calcium   Turnip greens, rhubarb, cooked:1 cup serving is 200 mgs of calcium   Salmon, canned, with bones: 3 oz serving is 180 mgs of calcium   Ice Cream: 1 cup serving is 175 mgs of calcium   Pudding, instant mix: 1/2 cup serving is 150 mgs of calcium   Almonds:  /2 cup serving is 150 mgs of calcium   Kale, cooked: 1 cup serving is 100 mgs of calcium   Lobster, cooked: 6 oz serving is 100 mgs of calcium   Tofu, with calcium sulfate:  3oz serving is 100 mgs of calcium     EXERCISE: Weight bearing exercises, such as walking, jogging, racquet sports, aerobics, and weight training  help make bones stronger.     VITAMIN D:  Vitamin D is essential to help your body absorb calcium. A healthy body can make its own vitamin D with the help of sunlight. But in the winter months, you may not get enough sun, and should make   sure you get it from another source, like milk or a multivitamin.     HORMONES: The hormone estrogen helps the female body and bones to use calcium. After menopause women need to discuss hormone treatment with their doctor.    * This is just a first step in helping improve your health.  * For help with a meal plan for you, make an appointment with the Nutritionist.  * For more information, you can also contact the National Nutrition Hotline   1-800-366-1655                                                              9/95 MAS 1/96, 12/97eqj, 01/02, 4/02

## 2009-05-19 NOTE — Progress Notes (Addendum)
Chief Complaint:  Deborah [name] is a 44 year old female who presents for a physical exam and f/u re finger pain.     Concerned over recently weight gain after initial weight loss, with pt noting enlargement at breasts that is associated with worsening back pain.     Patient Active Problem List:     LUMP OR MASS IN BREAST [611.72]     ESOPHAGEAL REFLUX [530.81]     GASTRITIS NEC W/O HEMORRH [535.40]     ABDOMINAL PAIN EPIGASTRIC [789.06]     ANGIONEUROTIC EDEMA [995.1]     Plantar Fasciitis, L [728.9F]     Leg Pain [729.5H]     Tobacco Abuse, in Remission [V15.82K]     Triggering of Finger, R 3rd [727.03G]     Right subacromial bursitis/rotator cuff tendinopathy [719.41X]     Chondromalacia Patellae [717.7A]     Lower Back Pain [724.2U]     De Quervain's Tenosynovitis, R [727.04L]     CTS (Carpal Tunnel Syndrome), b/l [354.0J]     SVT (Supraventricular Tachycardia) [427.89DZ]     Family History of Diabetes Mellitus (DM) [V18.0B]     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]     Intermittent Spinal Claudication [435.1BM]     Vitamin D Deficiency [268.9G]     Greater Trochanteric Bursitis, b/l [726.5S]     DDD (Degenerative Disc Disease), Lumbar [722.52G]     Obesity [278.00J]        Current outpatient prescriptions:  Calcium Carbonate-Vit D-Min 600-400 MG-UNIT CHEW 1 tablet twice daily Disp: 60 tablet Rfl: 11   METOPROLOL SUCCINATE 50 MG OR TB24 1 TABLET DAILY Disp: 30 Rfl: 11         Allergies:  Review of Patient's Allergies indicates:   Ibuprofen               Swelling   Nsaids                  Rash, Swelling    Comment:Labial edema with alleve, rash as well   Pollen extract/tree*    Runny Nose    Comment:HA, itchy watery eyes    Health Maintenance:  PHYSICAL EXAM (AGE 81-49) due on 11/12/2008  PAP SMEAR due on 11/12/2009  TETANUS (16 AND OVER) due on 11/12/2009  MAMMOGRAPHY( YEARLY) due on 02/25/2010  LIPID SCREENING due on 06/30/2013  HIV SCREENING Completed  HEP B HIGH RISK VACCINE EVAL (ONCE)  Completed    Immunizations:    Immunization History  Administered            Date(s) Administered    FLU VACCINE,3 YEARS AND ABOVE 0.50 ML                          02/18/2004  02/13/2005  03/26/2008      H1N1 0.47ml Intranasal                          03/26/2008      Histories:    Past Medical History    Irregular menstrual cycle     Pregnant state, incidental     Comment: c/s breech    Esophageal reflux     SVT (supraventricular tachycardia) 3/10    Comment: TCH         Past Surgical History    BREAST BIOPSY SPECIMEN     Comment s/p fibroadenoma on the L,     SEPTOP/SBMCSL RESCJ  C DLVR ONLY     Comment breech         Social History   Marital Status: Married  Spouse Name: N/A    Years of Education: N/A  Number of Children: 1     Occupational History  housecleaning       Social History Main Topics   Tobacco Use: Quit  0.5 Packs/Day  For 27.00 Years     Quit date: 06/28/2002    Alcohol Use: No    Drug Use: No    Sexually Active: Not Currently  Partner(s): Female    Birth Control/ Protection: Pill    Comment: no STI hx; HIV neg 2002     Other Topics Concern    Stress Concern Yes    Comment: rev    Weight Concern Yes    Comment: rev    Special Diet Yes    Comment: rev    Exercise No    Seat Belt Yes    Comment: rev    Self-Exams Yes    Comment: rev     Social History Narrative    [place], [place]. To [place] 2000.    Lives with her son and her parents. Divorced from husband.    Denies DV. No guns in home.       Family History    Hypertension Mother    Hypertension Father    Diabetes Mother    Diabetes Maternal Uncle    Diabetes Maternal Uncle    Cancer - Other FamHxNeg    Cancer - Breast FamHxNeg    Cancer - Colon FamHxNeg    Cancer - Lung FamHxNeg    Cancer - Ovarian FamHxNeg    Heart Maternal Grandmother    Heart Maternal Uncle    Heart Maternal Uncle         Review of Systems:                   Skin: denies rashes or lesions  Eyes: denies glasses in use or visual loss/disturbance  Ears/Nose/Throat: denies hearing loss,  nasal blockade or sore throats  Respiratory: denies SOB, cough  Cardiovascular: denies CP; notes occ palpitations  Gastrointestinal: denies abdominal pain, N/V/D/C or blood in stool  Genitourinary: denies dysuria  Gyn: denies abnormal bleeding, pelvic pain or discharge or breast pain or new or enlarging lumps on self exam  Musculoskeletal: notes chronic joint pain at knees and shoulders; 3rd R finger triggering again but declines surgical intervention  Neurologic: denies syncope or sz d/o; notes daily numbness at hands, 2-4th fingers upon awakening, resolving easily with massage; no splints in use  Endocrine: denies polyuria/dipsia; notes recent episodes of flushing  Psychiatric: denies MDD/anxiety  Hematologic/Lymphatic/Immunologic: notes recent weight gain after initial wt loss    Physical: pap/pelvic/breast exam chaperoned by [employer] [place], [place]  BP 116/80   Pulse 81   Temp(Src) 97.6 F (36.4 C) (Temporal)   Resp 20   Ht 5\' 2"  (1.575 m)   Wt 165 lb (74.844 kg)   SpO2 98%   LMP 10/17/2006  General appearance: healthy, alert, well developed, well nourished  Eyes: conjunctivae/corneas clear. PERRLA, EOM's intact. Fundi benign  Skin: skin color, texture, turgor are normal  Head: Normocephalic. No masses, lesions, tenderness or abnormalities  Ears: External ears normal. Canals clear. TM's normal.  Nose/Sinuses: Nares normal. Septum midline. Mucosa normal. No drainage or sinus tenderness.  Oropharynx: Lips, mucosa, and tongue normal. Teeth and gums normal. Oropharynx moist and without lesion  Neck:  Neck supple. No adenopathy. Thyroid symmetric, normal size, and without nodularity  Back: Back symmetric, no curvature. ROM normal. No CVA tenderness.  Lungs: Percussion normal. Good diaphragmatic excursion. Lungs clear to auscultation bilaterally  Heart: PMI normal. No lifts, heaves, or thrills. RRR. No murmurs, clicks, gallops or rubs  Breast: breasts symmetric, no dominant or suspicious mass, no skin or nipple changes, no  axillary adenopathy and self exam in taught and encouraged  Abdomen: Abdomen soft, non-tender. BS normal. No masses, no organomegaly  Extremities: Extremities normal. No deformities, edema, or skin discoloration  Musculoskeletal: Spine ROM normal. Muscular strength intact.  Peripheral pulses: radial pulse and DP pulse nml b  Neuro: Gait normal. Reflexes normal and symmetric. Sensation grossly normal   Pelvic: External genitalia and vagina normal. Bimanual and rectovaginal exam normal  Rectal: deferred    Health Counseling:  Smoking:  Reviewed and Discussed  Substance Use Issues:   Reviewed and Discussed   Seat Belts:  Reviewed and Discussed  Smoke Dectectors:  Reviewed and Discussed  Diet, Exercise, Wt. Control:  Reviewed and Discussed  Vision Health:  Reviewed and Discussed  Mental Health:  Reviewed and Discussed  Domestic Violence:  Reviewed and Discussed  Sexual Health:  Reviewed and Discussed  Osteoperosis: Reviewed and Discussed    ASSESSMENT/PLAN:  V70.0 Routine general medical examination at a health care facility  (primary encounter diagnosis)  Comment: healthy female  Plan: next CPE >05/19/10    627.2B Menopausal state  Comment: menopausal for a number of years, having been on OCPs previously  Plan: Calcium Carbonate-Vit D-Min 600-400 MG-UNIT         CHEW          V18.0B Family history of diabetes mellitus (DM)  Comment: asx, nml a1c 2010  Plan: counseled re wt loss    268.9G Vitamin D deficiency  Comment: prev noted; s/p supplementation  Plan: recheck today    278.00J Obesity  Comment: chronic; A1c nml 2010  Plan: counseled to lose weight; nutr consult; if unable to achieve wt loss and with continued back pain, will consider plastics referral for breast reduction    V76.2 Screening for malignant neoplasm of the cervix  Comment: asx, last pap (nml) 2008  Plan: CYTOPATH, C/V, THIN LAYER    727.03G Triggering of Finger, R 3rd  Comment: chronic, improved initially after PT; declines surgical intervention  Plan:  counseled pt re treatment options; f/u c ortho as wishes    354.0J CTS (Carpal Tunnel Syndrome), b/l  Comment: chronic, worsening without splints in use  Plan: counseled to use splints nightly; if worsening or not resolving, f/u ortho    I have reviewed the past medical, surgical, social and family history and updated these sections of EpicCare as relevant. All interim labs, test results, and consult notes were reviewed and discussed with Deborah [name]. Medications were reconciled during this visit and a current medication list was given to the patient at the end of the visit.    F/u c PCP prn

## 2009-05-21 LAB — VITAMIN D,25 HYDROXY: VITAMIN D,25 HYDROXY: 20 ng/ml — ABNORMAL LOW (ref 30.0–100.0)

## 2009-06-02 LAB — CYTOPATH, C/V, THIN LAYER

## 2009-06-08 MED ORDER — VITAMIN D 50 MCG (2000 UT) PO TABS
2000.0000 [IU] | ORAL_TABLET | Freq: Every day | ORAL | Status: DC
Start: 2009-06-08 — End: 2010-12-22

## 2009-06-08 NOTE — Progress Notes (Addendum)
Addended byGwenith Spitz on: 06/08/2009      Modules accepted: Orders

## 2009-06-21 ENCOUNTER — Ambulatory Visit (HOSPITAL_BASED_OUTPATIENT_CLINIC_OR_DEPARTMENT_OTHER): Payer: Medicaid Other | Admitting: Internal Medicine

## 2009-06-21 DIAGNOSIS — R002 Palpitations: Secondary | ICD-10-CM

## 2009-06-21 DIAGNOSIS — I471 Supraventricular tachycardia, unspecified: Secondary | ICD-10-CM

## 2009-06-21 NOTE — Progress Notes (Signed)
This office note has been dictated. Account number [id number]

## 2009-06-25 LAB — MEDICAL SPECIALTIES LETTER

## 2009-07-23 ENCOUNTER — Ambulatory Visit (HOSPITAL_BASED_OUTPATIENT_CLINIC_OR_DEPARTMENT_OTHER): Payer: Medicaid Other | Admitting: Registered"

## 2009-08-04 ENCOUNTER — Ambulatory Visit (HOSPITAL_BASED_OUTPATIENT_CLINIC_OR_DEPARTMENT_OTHER): Payer: Self-pay | Admitting: Internal Medicine

## 2009-08-04 LAB — CBC, PLATELET & DIFFERENTIAL
BASOPHIL %: 0.3 % (ref 0.0–2.0)
EOSINOPHIL %: 1.6 % (ref 0.0–7.0)
HEMATOCRIT: 37.2 % (ref 36.0–48.0)
HEMOGLOBIN: 12.7 g/dl (ref 12.0–16.0)
LYMPHOCYTE %: 34.5 % (ref 13.0–39.0)
MEAN CORP HGB CONC: 34.1 g/dl (ref 32.0–36.0)
MEAN CORPUSCULAR HGB: 30.2 pg (ref 27.0–33.0)
MEAN CORPUSCULAR VOL: 88.4 fl (ref 80.0–100.0)
MEAN PLATELET VOLUME: 9.3 fl (ref 6.4–10.8)
MONOCYTE %: 7.9 % (ref 1.0–12.0)
NEUTROPHIL %: 55.7 % (ref 46.0–79.0)
PLATELET COUNT: 194 10*3/uL (ref 150–400)
RBC DISTRIBUTION WIDTH: 12.3 % (ref 11.5–14.3)
RED BLOOD CELL COUNT: 4.21 M/uL — ABNORMAL LOW (ref 4.50–5.10)
WHITE BLOOD CELL COUNT: 7.8 10*3/uL (ref 4.0–10.8)

## 2009-08-04 LAB — COMPREHENSIVE METABOLIC PANEL
ALANINE AMINOTRANSFERASE: 23 IU/L (ref 7–35)
ALBUMIN: 4.2 g/dl (ref 3.4–4.8)
ALKALINE PHOSPHATASE: 93 IU/L (ref 25–106)
ANION GAP: 10 mmol/L (ref 2–25)
ASPARTATE AMINOTRANSFERASE: 30 IU/L (ref 8–34)
BILIRUBIN TOTAL: 0.4 mg/dl (ref 0.2–1.1)
BUN (UREA NITROGEN): 10 mg/dl (ref 6–20)
CALCIUM: 9.8 mg/dl (ref 8.6–10.3)
CARBON DIOXIDE: 28 mmol/L (ref 22–32)
CHLORIDE: 106 mmol/L (ref 101–111)
CREATININE: 0.7 mg/dl (ref 0.4–1.2)
ESTIMATED GLOMERULAR FILT RATE: >60 mL/min (ref >60)
Glucose Random: 87 mg/dl (ref 74–160)
POTASSIUM: 4.5 mmol/L (ref 3.5–5.1)
SODIUM: 144 mmol/L (ref 135–144)
TOTAL PROTEIN: 7.2 g/dl (ref 5.9–7.5)

## 2009-08-04 LAB — THYROID SCREEN TSH REFLEX FT4: THYROID SCREEN TSH REFLEX FT4: 0.97 u[IU]/mL (ref 0.34–5.60)

## 2009-08-05 LAB — CARDIAC STRESS TEST: EXERCISE/TREADMILL

## 2009-08-06 LAB — EKG

## 2009-08-12 LAB — HOLTER MONITORING

## 2009-08-16 ENCOUNTER — Ambulatory Visit (HOSPITAL_BASED_OUTPATIENT_CLINIC_OR_DEPARTMENT_OTHER): Payer: Medicaid Other | Admitting: Internal Medicine

## 2009-08-16 DIAGNOSIS — R002 Palpitations: Secondary | ICD-10-CM

## 2009-08-16 DIAGNOSIS — I471 Supraventricular tachycardia, unspecified: Secondary | ICD-10-CM

## 2009-08-16 NOTE — Progress Notes (Signed)
This office note has been dictated. Account number [id number]

## 2009-08-20 LAB — MEDICAL SPECIALTIES LETTER

## 2009-08-26 ENCOUNTER — Ambulatory Visit (HOSPITAL_BASED_OUTPATIENT_CLINIC_OR_DEPARTMENT_OTHER): Payer: Medicaid Other | Admitting: Registered"

## 2009-08-26 VITALS — Ht 62.0 in | Wt 165.0 lb

## 2009-08-26 DIAGNOSIS — E786 Lipoprotein deficiency: Secondary | ICD-10-CM

## 2009-08-26 DIAGNOSIS — Z833 Family history of diabetes mellitus: Secondary | ICD-10-CM

## 2009-08-26 DIAGNOSIS — E669 Obesity, unspecified: Secondary | ICD-10-CM

## 2009-08-26 NOTE — Progress Notes (Signed)
[name]  INITIAL NUTRITION ASSESSMENT / INTERVENTION    Beginning Time: 900 AM  End Time: 1000 AM     Total Minutes:       Deborah [name] is a 44 year old female who presents with obesity .  Visit via [place] interpreter,  The patient is from [place]. Ethnicity: [place].  Patient's language is [place]. Patient able to read [place].  She has been in the [place] for 1-5 years.  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  Patient lives with young children age  38 yearss parents .     Subjective (including lifestyle changes): Patient reports  i cannot lose weight         Family History    Hypertension Mother    Hypertension Father    Diabetes Mother    Diabetes Maternal Uncle    Diabetes Maternal Uncle    Cancer - Other FamHxNeg    Cancer - Breast FamHxNeg    Cancer - Colon FamHxNeg    Cancer - Lung FamHxNeg    Cancer - Ovarian FamHxNeg    Heart Maternal Grandmother    Heart Maternal Uncle    Heart Maternal Uncle           Patient has had obesity  for her entire life and has not had Medical Nutrition Therapy before today.      Current outpatient prescriptions:  Cholecalciferol (VITAMIN D) 2000 UNIT tablet Take 2,000 Units by mouth daily. Disp: 30 tablet Rfl: 11   Calcium Carbonate-Vit D-Min 600-400 MG-UNIT CHEW 1 tablet twice daily Disp: 60 tablet Rfl: 11   METOPROLOL SUCCINATE 50 MG OR TB24 1 TABLET DAILY Disp: 30 Rfl: 11         Vitamins/Minerals/Herbal Supplements: None       Tobacco Use: Quit  0.5 Packs/Day  For 27.00 Years     Quit date: 06/28/2002    Alcohol Use: No       Review of Patient's Allergies indicates:   Ibuprofen               Swelling   Nsaids                  Rash, Swelling    Comment:Labial edema with alleve, rash as well   Pollen extract/tree*    Runny Nose    Comment:HA, itchy watery eyes    Labs reviewed: Yes  (For list of labs type "dot" LLNUTA)    Vitals:  Most Recent BP Reading(s)  05/19/2009 : 116/80          Most Recent Height Reading(s)  05/19/2009 :  5\' 2"  (1.575 m)      Most Recent Weight Reading(s)  05/19/2009 : 165 lb (74.844 kg)     Weight change: No weight change.    Estimated Body mass index is 30.18 kg/(m^2) as calculated from the following:    Height as of 05/19/09: 5\' 2" (1.575 m).    Weight as of 05/19/09: 165 lb(74.844 kg).  BMI Category: 30-34.9 obesity class I    Highest weight:       Lowest weight:        Desired weight: 130        Physical Activity:  Type: not physically active     Barriers to physical activity include: lack of time    Activities of Daily Living: Independent    Chewing ability: no concerns   Swallowing ability: no concerns   Appetite:  Food Allergies: No known allergies  Food Intolerances:        Religious restrictions/Special diet: None  Food purchased by: self   Food prepared by:  Self   Meal sources: take out / restaurant,  times per week  Economic factors and food assist:   Psychosocial factors / Mental Status: anxious  Stress: Moderate  Readiness to learn: motivated     24 hour recall reveals patient attempts to follow no restrictions in diet.  She skips lunch and eats at irregular times.      Portion size is excessive.  Eating frequency is irregular  Eating habits are culturally based [place]    Estimated intake shows: excessive nutrients: calories, carbohydrates, fat, saturated fat and added sugar    Assessment/Plan/Conclusion:  Discussed with patient food portion sizes importance of  meal pattern what is a healthy diet      Barriers to dietary changes include: none    Client's goals: 1 choose high fiber cereals     Material provided:    Future educational needs: none     Referred to: None    Follow-up: 2 months.    Dannielle Karvonen

## 2009-08-30 ENCOUNTER — Other Ambulatory Visit (HOSPITAL_BASED_OUTPATIENT_CLINIC_OR_DEPARTMENT_OTHER): Payer: Self-pay | Admitting: Internal Medicine

## 2009-08-30 DIAGNOSIS — I471 Supraventricular tachycardia, unspecified: Secondary | ICD-10-CM

## 2009-08-30 MED ORDER — METOPROLOL SUCCINATE ER 50 MG PO TB24
50.0000 mg | ORAL_TABLET | Freq: Every day | ORAL | Status: DC
Start: 2009-08-30 — End: 2010-04-06

## 2009-09-30 ENCOUNTER — Ambulatory Visit (HOSPITAL_BASED_OUTPATIENT_CLINIC_OR_DEPARTMENT_OTHER): Payer: Medicaid Other | Admitting: Hand Surgery

## 2009-09-30 DIAGNOSIS — M6530 Trigger finger, unspecified finger: Secondary | ICD-10-CM

## 2009-09-30 NOTE — Progress Notes (Signed)
This office note has been dictated. Account number [id number]  The primary progress note for this visit has been dictated through E-Scription. It can be viewed as an attachment to this encounter or through Chart Review under the Other Tab as an Orthopedic Office Note.      Review of Systems: Constitutional, Eyes, ENT/Mouth, Cardiovascular, Respiratory, GI, GU, Neuro, Psych, Heme/Lymph, Skin, Musculoskeletal was reviewed and is NEGATIVE except for what is dictated in the note.    MT ACCT #:  [id number] PATIENT/PROCEDURE VERIFICATION DOCUMENTATION    Correct patient: Yes  Correct procedure: Yes  Correct site, mark visible if applicable: Yes    Pre-procedure Vital Signs:  BP: N/A  P: N/A  R: N/A    Risks and Benefits reviewed: Yes  Side: Right  Correct position: Yes  Special equipment/implant(s) present, if applicable: Yes    Post-procedure Vitals Signs:  BP: N/A  P: N/A  R: N/A    MEDICATION DOSE VOLUME   Xylocaine 1% - epinephrine free ------------- ml   Bupivicaine 0.5% - epinephrine free .3cc------------- ml   Depo-medrol 20mg /ml 20mg  ml   Depo-medrol 40mg /ml mg ml   Depo-medrol 80mg /ml mg ml   Kenalog 40mg /ml mg ml   Dexamethasone 10mg /ml mg ml   Other:  mg ml       Time-out completed, documented by provider doing procedure or designated team member:  Bessie Livingood A. Olam Idler, MD    09/30/2009    11:02 AM

## 2009-10-22 ENCOUNTER — Ambulatory Visit (HOSPITAL_BASED_OUTPATIENT_CLINIC_OR_DEPARTMENT_OTHER): Payer: Medicaid Other | Admitting: Registered"

## 2009-10-28 ENCOUNTER — Encounter (HOSPITAL_BASED_OUTPATIENT_CLINIC_OR_DEPARTMENT_OTHER): Payer: Medicaid Other | Admitting: Registered"

## 2009-11-01 ENCOUNTER — Ambulatory Visit (HOSPITAL_BASED_OUTPATIENT_CLINIC_OR_DEPARTMENT_OTHER): Payer: Medicaid Other | Admitting: Hand Surgery

## 2009-11-01 DIAGNOSIS — M6530 Trigger finger, unspecified finger: Secondary | ICD-10-CM

## 2009-11-01 MED ORDER — METHYLPREDNISOLONE ACETATE 20 MG/ML IJ SUSP
20.00 mg | INTRAMUSCULAR | Status: AC
Start: 2009-11-01 — End: 2009-11-02

## 2009-11-01 MED ORDER — BUPIVACAINE HCL 0.5 % IJ SOLN
0.50 mL | INTRAMUSCULAR | Status: AC
Start: 2009-11-01 — End: 2009-11-02

## 2009-11-01 NOTE — Progress Notes (Signed)
This office note has been dictated. Account number [id number]  The primary progress note for this visit has been dictated through E-Scription. It can be viewed as an attachment to this encounter or through Chart Review under the Other Tab as an Orthopedic Office Note.      Review of Systems: Constitutional, Eyes, ENT/Mouth, Cardiovascular, Respiratory, GI, GU, Neuro, Psych, Heme/Lymph, Skin, Musculoskeletal was reviewed and is NEGATIVE except for what is dictated in the note.    MT ACCT #:  [id number] PATIENT/PROCEDURE VERIFICATION DOCUMENTATION    Correct patient: Yes  Correct procedure: Yes  Correct site, mark visible if applicable: Yes    Pre-procedure Vital Signs:  BP: N/A  P: N/A  R: N/A    Risks and Benefits reviewed: Yes  Side: Right IF  Correct position: Yes  Special equipment/implant(s) present, if applicable: Yes    Post-procedure Vitals Signs:  BP: N/A  P: N/A  R: N/A    MEDICATION DOSE VOLUME   Xylocaine 1% - epinephrine free ------------- ml   Bupivicaine 0.5% - epinephrine free ------------- 0.42ml   Depo-medrol 20mg /ml 20mg  ml   Depo-medrol 40mg /ml mg ml   Depo-medrol 80mg /ml mg ml   Kenalog 40mg /ml mg ml   Dexamethasone 10mg /ml mg ml   Other:  mg ml       Time-out completed, documented by provider doing procedure or designated team member:  Lorette Peterkin D. Geralynn Rile    11/01/2009    12:47 PM

## 2009-11-09 ENCOUNTER — Encounter (HOSPITAL_BASED_OUTPATIENT_CLINIC_OR_DEPARTMENT_OTHER): Payer: Self-pay | Admitting: Emergency Medical Services

## 2009-11-09 ENCOUNTER — Emergency Department (HOSPITAL_BASED_OUTPATIENT_CLINIC_OR_DEPARTMENT_OTHER)
Admission: RE | Admit: 2009-11-09 | Disposition: A | Discharge status: Home / Self Care | Payer: Self-pay | Source: Emergency Department | Attending: Emergency Medical Services | Admitting: Emergency Medical Services

## 2009-11-09 LAB — FECAL LEUKOCYTE SCREEN

## 2009-11-09 LAB — CT ABDOMEN & PELVIS W IV CONTRAST

## 2009-11-09 LAB — CBC, PLATELET & DIFFERENTIAL
BASOPHIL %: 0.6 % (ref 0.0–2.0)
EOSINOPHIL %: 0.3 % (ref 0.0–7.0)
HEMATOCRIT: 40.9 % (ref 36.0–48.0)
HEMOGLOBIN: 13.8 g/dl (ref 12.0–16.0)
LYMPHOCYTE %: 13.8 % (ref 13.0–39.0)
MEAN CORP HGB CONC: 33.8 g/dl (ref 32.0–36.0)
MEAN CORPUSCULAR HGB: 30.3 pg (ref 27.0–33.0)
MEAN CORPUSCULAR VOL: 89.6 fl (ref 80.0–100.0)
MEAN PLATELET VOLUME: 8.8 fl (ref 6.4–10.8)
MONOCYTE %: 5.4 % (ref 1.0–12.0)
NEUTROPHIL %: 79.9 % — ABNORMAL HIGH (ref 46.0–79.0)
PLATELET COUNT: 195 10*3/uL (ref 150–400)
RBC DISTRIBUTION WIDTH: 12.8 % (ref 11.5–14.3)
RED BLOOD CELL COUNT: 4.57 M/uL (ref 4.50–5.10)
WHITE BLOOD CELL COUNT: 9.4 10*3/uL (ref 4.0–10.8)

## 2009-11-09 LAB — HOLD BLUE TOP TUBE

## 2009-11-09 LAB — BLOOD SUGAR FINGERSTICK (POINT OF CARE): FINGERSTICK GLUCOSE: 111 mg/dl (ref 74–160)

## 2009-11-09 LAB — C DIFFICILE PCR: C DIFFICILE TOXIN GENE PCR: NEGATIVE

## 2009-11-09 MED ORDER — FAMOTIDINE 10 MG/ML IV SOLN
INTRAVENOUS | Status: AC
Start: 2009-11-09 — End: 2009-11-09
  Filled 2009-11-09: qty 2

## 2009-11-09 MED ORDER — OMEPRAZOLE 20 MG PO CPDR
20.0000 mg | DELAYED_RELEASE_CAPSULE | Freq: Two times a day (BID) | ORAL | Status: DC
Start: 2009-11-09 — End: 2010-06-30

## 2009-11-09 MED ORDER — ONDANSETRON HCL 4 MG/2ML IJ SOLN
INTRAMUSCULAR | Status: AC
Start: 2009-11-09 — End: 2009-11-09
  Filled 2009-11-09: qty 2

## 2009-11-09 MED ORDER — MORPHINE SULFATE 4 MG/ML IJ SOLN
INTRAMUSCULAR | Status: AC
Start: 2009-11-09 — End: 2009-11-09
  Filled 2009-11-09: qty 1

## 2009-11-09 MED ORDER — ONDANSETRON HCL 4 MG PO TABS
4.00 mg | ORAL_TABLET | Freq: Four times a day (QID) | ORAL | Status: AC | PRN
Start: 2009-11-09 — End: 2009-12-09

## 2009-11-09 NOTE — ED Notes (Signed)
Stomach pain since yesterday morning. Diarrhea and vomiting since 8pm tonight.

## 2009-11-09 NOTE — Discharge Instructions (Signed)
Dieta para DRGE ou UP  (Bland Diet, Diet for GERD or Peptic Ulcer Disease)     Uma terapia de nutrio pode ajudar a aliviar o desconforto da doena do refluxo gastresofgico (DRGE) e doena da lcera pptica (UP).      INSTRUES PARA CUIDADOS EM CASA   Coma devagar suas refeies, num ambiente relaxante.   Coma de 5 a 6 refeies pequenas por dia.   Se um alimento causa desconforto, pare de com-lo por um tempo.     ALIMENTOS A EVITAR:   Caf, normal ou descafeinado.   Bebidas de cola, regulares ou de baixa caloria.    Ch normal ou descafeinado.   Pimenta.   Cacau.   Alimentos ricos em gordura, incluindo carnes.   Manteiga, margarina, leo hidrogenado (gorduras trans)   Hortel-pimenta ou hortel verde (se voc tiver DRGE).   Frutas e legumes, segundo a tolerncia.   Bebidas alcolicas.     Nicotina (de fumo ou Armed forces logistics/support/administrative officer), uma vez que este  [employer] potentes estimulantes para a produo de cido no trato gastrintestinal.    Patent attorney alimento que parea agravar sua condio.       Se voc tiver perguntas relativas  sua dieta, ligue para o seu mdico ou um nutricionista qualificado.      OUTRAS DICAS PARA QUEM TEM DRGE:   Deitar pode piorar os sintomas. Mantenha a cabea da cama elevada em 15-23cm usando um calo de ou blocos de espuma sob as pernas da cama.   No deite antes de 3 horas depois de comer uma refeio.   Atividades fsicas dirias ajudam a reduzir os sintomas.      GARANTA QUE VOC:    Entende essas instrues.    Ir monitorar sua condio.   Ir obter Abelina Bachelor imediatamente se no se sentir bem ou piorar.     Document Released: 03/27/2005  Document Re-Released: 01/21/2009  [facility] Patient Information 2010 [place], [place].Dor Abdominal  (Abdominal Pain)        A dor abdominal ou de estmago pode ser causada por muitas coisas. Seu mdico decidir sobre a gravidade da sua dor atravs de um exame e, possivelmente, de um hemograma e raios X. Muitos casos podem ser observados e  tratados em casa. A dor abdominal nas crianas , na [employer], funcional. Isto significa que ela no  causada por uma doena e provavelmente melhorar sem a necessidade de tratamento. No entanto, em muitos casos,  preciso esperar mais tempo para poder identificar claramente a Engineering geologist. Antes disso, pode no ser possvel determinar se so necessrios mais testes, ou se  preciso proceder  internao ou cirurgia.     INSTRUES PARA TRATAMENTO DOMICILIAR   No tome nem oferea laxantes, a menos que seu mdico solicite.   S tome remdio para dor se solicitado pelo mdico.    S tome de medicamentos de venda livre ou restrita para a dor, desconforto ou febre conforme orientaes mdicas.   Tente uma dieta de lquidos claros - caldos, ch ou gua durante 4 horas, ou conforme prescrito pelo mdico. Ento, voc pode gradualmente introduzir uma dieta branda, conforme tolerada pelo paciente.      PROCURE ASSISTNCIA MDICA IMEDIATAMENTE SE:   Se a dor no desaparecer.   Se a temperatura oral ultrapassar 38.9 C (102 F), ou conforme orientao mdica.    Se houver vmitos repetidos.    Se a dor tornar-se localizada no quadrante inferior direito do abdmen (possivelmente apendicite) ou, em um adulto, no  quadrante inferior esquerdo do abdmen (possivelmente colite ou diverticulite).   Se houver sangue nas fezes (fezes de cor vermelha brilhante ou de cor preta).      CERTIFIQUE-SE DE:   Compreende as instrues referentes  alta.    Ir monitorar sua condio.   Procurar assistncia mdica imediatamente, conforme indicado.     Document Released: 03/27/2005  Document Re-Released: 06/23/2008  [facility] Patient Information 2010 [place], [place].Dieta BRAT, adultos  (B.R.A.T. Diet, Adults)        A eliminao frequente de fezes lquidas (diarreia) tem muitas causas. A diarreia pode ser causada ou piorada pelo que voc como ou bebe. Essa condio pode ser [place] modificando seus hbitos alimentares.      Na [facility], continue se alimentando como de Research scientist (medical). O descanso do intestino j foi usado como tratamento. Porm, hoje acredita-se que continuar sua dieta normal resultar numa diarreia mais fcil de Academic librarian.     A dieta BRAT  um tratamento comum para a diarreia. Essa dieta consiste em:   Marin Olp (do ingls, Rice) ou outro alimento com amido   Molho de [place] (Applesauce em ingls)    Torrada      Reduza ou evite gorduras e doces ate que a diarreia diminua. Lquidos claros tambm ajudam muito nos primeiros dias. Lquidos claros so aqueles que apresentam certa transparncia no copo. Os lquidos so importantes porque  fcil perder fluido em excesso do corpo (desidratao). Os lquidos perdidos precisam ser repostos.     A dieta BRAT no possui calorias e outros nutrientes em quantidade suficiente. Isso pode piorar a diarreia e causar deficincias nutricionais.       PROCURE UM MDICO IMEDIATAMENTE SE:   Voc no conseguir controlar a perda de lquidos.   O vmito piorar ou a diarreia Information systems manager (persistente).   Voc sentir dor de barriga (abdominal), se ela aumentar ou se localizar (sentida num s lugar).    Voc apresentar febre alta. Ou seja, uma temperatura oral [employer] 38,9 C, que no  controlada por medicamentos e dura mais de 2 dias, ou como indicar seu mdico.   A diarreia se tornar excessiva ou voc encontrar sangue ou mucosa nas fezes.   Voc apresentar fraqueza, tontura, desmaios ou sede extrema.   Checar seu peso diariamente pode ajudar a garantir uma reposio [place] de lquidos. Seu mdico dir que tipo de perda de peso deve lhe preocupar ou sugerir outra consulta com seu mdico pessoal.   Pese-se agora. Compare esse peso com sua balana de casa e registre todos os pesos, horrio e data de pesagem. Tente se pesar no mesmo horrio todos os dias. Traga esses valores ao mdico se voc precisar ir a Teacher, early years/pre.           Document Released: 06/23/2008     [facility] Patient Information 2010 [place], [place].

## 2009-11-10 LAB — OVA AND PARASITES

## 2009-11-12 LAB — HELICOBACTER PYLORI IGA: HELICOBACTER PYLORI IGA: <0.89 index (ref 0.00–0.88)

## 2009-11-17 LAB — EMERGENCY ROOM NOTE

## 2009-11-18 ENCOUNTER — Encounter (HOSPITAL_BASED_OUTPATIENT_CLINIC_OR_DEPARTMENT_OTHER): Payer: Medicaid Other | Admitting: Registered"

## 2009-11-18 LAB — COMPREHENSIVE METABOLIC PANEL
ALANINE AMINOTRANSFERASE: 21 IU/L (ref 7–35)
ALBUMIN: 4.2 g/dl (ref 3.4–4.8)
ALKALINE PHOSPHATASE: 102 IU/L (ref 25–106)
ANION GAP: 9 mmol/L (ref 2–25)
ASPARTATE AMINOTRANSFERASE: 31 IU/L (ref 8–34)
BILIRUBIN TOTAL: 1.2 mg/dl — ABNORMAL HIGH (ref 0.2–1.1)
BUN (UREA NITROGEN): 23 mg/dl — ABNORMAL HIGH (ref 6–20)
CALCIUM: 9 mg/dl (ref 8.6–10.3)
CARBON DIOXIDE: 25 mmol/L (ref 22–32)
CHLORIDE: 107 mmol/L (ref 101–111)
CREATININE: 0.7 mg/dl (ref 0.4–1.2)
ESTIMATED GLOMERULAR FILT RATE: >60 mL/min (ref >60)
Glucose Random: 120 mg/dl (ref 74–160)
POTASSIUM: 4.4 mmol/L (ref 3.5–5.1)
SODIUM: 141 mmol/L (ref 135–144)
TOTAL PROTEIN: 7.1 g/dl (ref 5.9–7.5)

## 2009-11-18 LAB — LIPASE: LIPASE: 58 U/L — ABNORMAL HIGH (ref 10–50)

## 2009-11-18 LAB — HOLD RED TOP TUBE

## 2009-11-18 LAB — HELICOBACTER PYLORI IGG: HELICOBACTER PYLORI IGG: NEGATIVE

## 2009-12-29 LAB — ORTHOPEDIC OFFICE NOTE

## 2010-01-14 ENCOUNTER — Ambulatory Visit (HOSPITAL_BASED_OUTPATIENT_CLINIC_OR_DEPARTMENT_OTHER): Payer: Medicaid Other | Admitting: Registered"

## 2010-01-14 VITALS — Ht 62.0 in | Wt 174.0 lb

## 2010-01-14 DIAGNOSIS — E669 Obesity, unspecified: Secondary | ICD-10-CM

## 2010-01-14 NOTE — Progress Notes (Signed)
[name]  NUTRITION RE-ASSESSMENT / INTERVENTION    Beginning Time: 130 PM  End Time: 200 PM     Total Minutes: 30 minutes      Deborah [name] is a 44 year old female who is at nutrition visit for re-assessment for obesity .  Visit without interpreter. Patient's language is [place].      Subjective (including lifestyle changes): Patient reports         Family History    Hypertension Mother     Hypertension Father     Diabetes Mother     Diabetes Maternal Uncle     Diabetes Maternal Uncle     Cancer - Other FamHxNeg     Cancer - Breast FamHxNeg     Cancer - Colon FamHxNeg     Cancer - Lung FamHxNeg     Cancer - Ovarian FamHxNeg     Heart Maternal Grandmother     Heart Maternal Uncle     Heart Maternal Uncle            Current outpatient prescriptions:  metoprolol (TOPROL-XL) 50 MG 24 hr tablet Take 1 tablet by mouth daily. Disp: 30 tablet Rfl: 11   Cholecalciferol (VITAMIN D) 2000 UNIT tablet Take 2,000 Units by mouth daily. Disp: 30 tablet Rfl: 11   Calcium Carbonate-Vit D-Min 600-400 MG-UNIT CHEW 1 tablet twice daily Disp: 60 tablet Rfl: 11         Vitamins/Minerals/Herbal Supplements: per outpatient prescriptions above         Smoking status: Former Smoker  0.5 Packs/Day  For 27.00 Years     Types: Cigarettes    Quit date: 06/28/2002    Smokeless tobacco:     Alcohol Use: No       Review of Patient's Allergies indicates:   Ibuprofen               Swelling   Nsaids                  Rash, Swelling    Comment:Labial edema with alleve, rash as well   Pollen extract/tree*    Runny Nose    Comment:HA, itchy watery eyes    Labs reviewed: Yes     Vitals:  Most Recent BP Reading(s)  11/09/09 : 119/64        Most Recent Height Reading(s)  08/26/09 : 5\' 2"  (1.575 m)      Most Recent Weight Reading(s)  08/26/09 : 165 lb (74.844 kg)     Weight change: Gained 7 pounds in the past 5 month(s).    Estimated Body mass index is 30.18 kg/(m^2) as calculated from the following:    Height as of 08/26/09: 5\' 2" (1.575 m).    Weight as of  08/26/09: 165 lb(74.844 kg).  BMI Category: 30-34.9 obesity class I     Physical Activity:  Type: used to be active, but has stopped     Barriers to physical activity include: lack of motivation    Meal sources: home cooked  Psychosocial factors / Mental Status: focused  Stress: Fair  Readiness to learn:     24 hour recall reveals patient attempts to follow no restrictions in diet.  She eats at night.      Portion size is irregular.  Eating frequency is irregular  Eating habits are are culturally based [place]    Estimated intake shows: sub-optimal nutrients: protein, calcium and fiber    Assessment/Plan/Conclusion:  Discussed with patient eviewed goals from previous  visit  Pt has not been attentive to food habits /is ready to begin Explained balanced diet using food models  Pt will write down foods   Barriers to dietary changes include: none    Client's goals: see above    Material provided:rhumbs up     Future educational needs:     Referred to: None    Follow-up: as needed.    Dannielle Karvonen

## 2010-02-21 ENCOUNTER — Ambulatory Visit (HOSPITAL_BASED_OUTPATIENT_CLINIC_OR_DEPARTMENT_OTHER): Payer: Medicaid Other | Admitting: Internal Medicine

## 2010-02-23 LAB — ORTHOPEDIC OFFICE NOTE

## 2010-03-23 ENCOUNTER — Encounter (HOSPITAL_BASED_OUTPATIENT_CLINIC_OR_DEPARTMENT_OTHER): Payer: Medicaid Other | Admitting: Internal Medicine

## 2010-04-06 ENCOUNTER — Ambulatory Visit (HOSPITAL_BASED_OUTPATIENT_CLINIC_OR_DEPARTMENT_OTHER): Payer: Medicaid Other | Admitting: Internal Medicine

## 2010-04-06 DIAGNOSIS — I471 Supraventricular tachycardia, unspecified: Secondary | ICD-10-CM

## 2010-04-06 MED ORDER — METOPROLOL SUCCINATE ER 50 MG PO TB24
50.0000 mg | ORAL_TABLET | Freq: Every day | ORAL | Status: DC
Start: 2010-04-06 — End: 2010-09-16

## 2010-04-06 NOTE — Progress Notes (Signed)
This office note has been dictated. Account number [id number]

## 2010-04-08 LAB — MEDICAL SPECIALTIES LETTER

## 2010-04-20 ENCOUNTER — Emergency Department (HOSPITAL_BASED_OUTPATIENT_CLINIC_OR_DEPARTMENT_OTHER)
Admission: RE | Admit: 2010-04-20 | Disposition: A | Discharge status: Home / Self Care | Payer: Self-pay | Source: Emergency Department | Attending: Emergency Medicine | Admitting: Emergency Medicine

## 2010-04-20 ENCOUNTER — Encounter (HOSPITAL_BASED_OUTPATIENT_CLINIC_OR_DEPARTMENT_OTHER): Payer: Self-pay | Admitting: Registered Nurse

## 2010-04-20 LAB — XR FOOT LEFT MINIMUM 3 VIEWS

## 2010-04-20 NOTE — ED Notes (Signed)
Friend interpreting per patient request. Pt reports tripping on rug and falling last night at 6pm injurying left foot. Pain @ left 5th metatarsal. Pedal pulse positive. Pain 3/10 at rest, increases to 7/10 partial weight bearing.  Tylenol without effect. LD this morning.

## 2010-04-20 NOTE — Discharge Instructions (Signed)
Machucado, Contusão, Hematoma  (Bruise (Contusion, Hematoma))     Um machucado (contusão) ou hematoma é um acúmulo de sangue sob a pele que causa uma área de descoloração. É causado por uma lesão dos vasos sangüíneos embaixo da área lesionada com uma liberação de sangue naquela área. O sangue acumulado é conhecido como hematoma. Esse acúmulo de sangue causa uma coloração azulada e, na medida que a lesão melhora, torna-se amarelada, desaparecendo com o tempo. Normalmente as lesões melhoram sem problemas residuais. O hematoma raramente requer drenagem.      INSTRUÇÕES PARA TRATAMENTO DOMICILIAR   Ø Aplique gelo na área lesionada durante 20 minutos quatro vezes ao dia no primeiro e no segundo dias.  Ø Ponha o gelo em um saco plástico e coloque uma toalha entre o saco de gelo e a sua pele. Retire o gelo se sentir dor.  Ø Se o sangramento for maior que um mínimo, aplique pressão à área durante pelo menos trinta minutos para diminuir o machucado ou aplique pressão e gelo conforme sugestão do médico.  Ø Se a lesão estiver em uma extremidade, a elevação dessa parte pode diminuir a dor e a inchação. Envolver com uma faixa ou envoltório de apoio também pode ser útil. Se o machucado estiver em uma extremidade mais baixa e for dolorido, muletas podem ser úteis por alguns dias.  Ø Se você tomou uma vacina antitetânica porque a pele foi lacerada, seu braço pode inchar e ficar vermelho e quente ao toque. Essa resposta é normal ao remédio da vacina. Se seu médico não aplicou vacina antitetânica porque você não se lembra quando recebeu o último, verifique no seu consultório médico e determine se é necessário. Em princípio, para um ferimento “sujo”, deve receber um reforço antitetânico se não teve um nos últimos cinco anos. Se tiver um ferimento “limpo”, deve receber reforço antitetânico se não recebeu um dentro dos últimos dez anos.     PROCURE ASSISTÊNCIA MÉDICA SE:  Ø Se tiver dor não controlada por medicamentos de uso livre. Só  tome no balcao ou medicinas de receita para dor, incômodo, ou febre como dirigido por seu médico. Não use aspirina, pois ela pode causar sangramento.   Ø Se desenvolver dor crescente ou inchação na área da lesão.   Ø Se a temperatura oral ultrapassar 38.9° C (102° F), ou conforme orientação médica.   Ø Se surgirem sintomas piores do que os que você apresentava quando chegou.      PROCURE ASSISTÊNCIA MÉDICA IMEDIATAMENTE:  Ø Se você sentir muita dor na área da lesão, desproporcional ao ferimento inicial.  Ø Se a área ferida ficar vermelha, sensível e inchada.     CERTIFIQUE-SE DE:  Ø Compreende as instruções referentes à alta.   Ø Irá monitorar sua condição.  Ø Procurará assistência médica imediatamente, conforme indicado.     Document Released: 01/04/2005  Document Re-Released: 06/23/2008  [employer]® Patient Information ©2010 [employer].    Return here for any new or worsening symptoms or any other problem.

## 2010-04-25 LAB — EMERGENCY ROOM NOTE

## 2010-06-08 ENCOUNTER — Emergency Department (HOSPITAL_BASED_OUTPATIENT_CLINIC_OR_DEPARTMENT_OTHER)
Admission: RE | Admit: 2010-06-08 | Disposition: A | Discharge status: Home / Self Care | Payer: Self-pay | Source: Emergency Department | Attending: Emergency Medicine | Admitting: Emergency Medicine

## 2010-06-08 ENCOUNTER — Encounter (HOSPITAL_BASED_OUTPATIENT_CLINIC_OR_DEPARTMENT_OTHER): Payer: Self-pay

## 2010-06-08 LAB — RAPID STREP (POINT OF CARE): STREP SCREEN: POSITIVE

## 2010-06-08 MED ORDER — OXYCODONE-ACETAMINOPHEN 5-325 MG PO TABS
1.0000 | ORAL_TABLET | ORAL | Status: AC | PRN
Start: 2010-06-08 — End: 2010-07-08

## 2010-06-08 MED ORDER — SODIUM CHLORIDE 0.9 % IV BOLUS
1000.0000 mL | INTRAVENOUS | Status: DC | PRN
Start: 2010-06-08 — End: 2010-06-09
  Administered 2010-06-08 (×3): 1000 mL via INTRAVENOUS

## 2010-06-08 MED ORDER — DEXAMETHASONE SODIUM PHOSPHATE 4 MG/ML IJ SOLN
8.00 mg | Freq: Once | INTRAMUSCULAR | Status: AC
Start: 2010-06-08 — End: 2010-06-08
  Administered 2010-06-08: 8 mg via INTRAVENOUS

## 2010-06-08 MED ORDER — PENICILLIN V POTASSIUM 500 MG PO TABS
500.00 mg | ORAL_TABLET | Freq: Once | ORAL | Status: AC
Start: 2010-06-08 — End: 2010-06-08
  Administered 2010-06-08: 500 mg via ORAL
  Filled 2010-06-08: qty 1

## 2010-06-08 MED ORDER — OXYCODONE-ACETAMINOPHEN 5-325 MG PO TABS
2.00 | ORAL_TABLET | Freq: Once | ORAL | Status: AC
Start: 2010-06-08 — End: 2010-06-08
  Administered 2010-06-08: 2 via ORAL
  Filled 2010-06-08: qty 2

## 2010-06-08 MED ORDER — PENICILLIN V POTASSIUM 500 MG PO TABS
500.00 mg | ORAL_TABLET | Freq: Four times a day (QID) | ORAL | Status: AC
Start: 2010-06-08 — End: 2010-06-18

## 2010-06-08 MED ORDER — DEXAMETHASONE SODIUM PHOSPHATE 4 MG/ML IJ SOLN
INTRAMUSCULAR | Status: AC
Start: 2010-06-08 — End: 2010-06-08
  Administered 2010-06-08: 8 mg via INTRAVENOUS
  Filled 2010-06-08: qty 2

## 2010-06-08 NOTE — Discharge Instructions (Signed)
Meds as directed.  Plenty of fluids.  Rest.  Return at any time if worse, or for new or different symptoms.  See your doctor if unimproved in 7 days.

## 2010-06-08 NOTE — ED Notes (Signed)
Pt states since yesterday, she has a sore throat, left ear ache and body aches all over. Pt describes her muscles ache all over. Pt states that tylenol is upsetting her stomach, and she is allergic to motrin.

## 2010-06-09 LAB — EMERGENCY ROOM NOTE

## 2010-06-29 ENCOUNTER — Telehealth (HOSPITAL_BASED_OUTPATIENT_CLINIC_OR_DEPARTMENT_OTHER): Payer: Self-pay | Admitting: Registered Nurse

## 2010-06-29 NOTE — Telephone Encounter (Signed)
Spoke with patient using telephone interpreter  20 days ago went to ED with strep throat  Given antibiotics, finished all of it  Fever and sore throat started back yesterday  Throat very sore  Feels like strep throat again  Would like an eval  Apt made for 1010am tomorrow with Judie Petit. [place]  Encouraged to take OTC pain meds today and in the morning  Encouraged to increase fluids, add soups, or pop cycles, and tea with honey  Add humidification, try steamy shower, saline nasal spray, warm salt water gargles  Agrees with plan

## 2010-06-29 NOTE — Telephone Encounter (Signed)
Message copied by Ulla Potash on Wed Jun 29, 2010  3:34 PM  ------       Message from: Murvin Donning       Created: Wed Jun 29, 2010  2:47 PM       Regarding: sick call - "sore throat, headache and fever         [facility]              Deborah [name] [id number], 45 year old, female, Telephone Information:        Home Phone      [phone]       Work Phone      Not on file.       Mobile          [phone]                     [name] NUMBER: [phone]       Best time to call back: anytime       Cell phone:        Other phone:              Available times:              Patient's language of care: [place]              Patient does not need an interpreter.              Patient's PCP: Judieth Keens, MD              Person calling on behalf of patient: Patient (self)              Calls today with a sick call. Patient called and said she has sore throat, headache and fever started last night. Please call the patient and advise.              Thanks                     Patient's Preferred Pharmacy:        [facility] ([facility]              Phone: [phone] Fax: [phone]

## 2010-06-30 ENCOUNTER — Encounter (HOSPITAL_BASED_OUTPATIENT_CLINIC_OR_DEPARTMENT_OTHER): Payer: Self-pay | Admitting: Family Medicine

## 2010-06-30 ENCOUNTER — Ambulatory Visit (HOSPITAL_BASED_OUTPATIENT_CLINIC_OR_DEPARTMENT_OTHER): Payer: PRIVATE HEALTH INSURANCE | Admitting: Family Medicine

## 2010-06-30 VITALS — BP 100/60 | HR 72 | Temp 97.9°F | Resp 18 | Wt 160.0 lb

## 2010-06-30 DIAGNOSIS — J020 Streptococcal pharyngitis: Secondary | ICD-10-CM

## 2010-06-30 DIAGNOSIS — Z1239 Encounter for other screening for malignant neoplasm of breast: Secondary | ICD-10-CM

## 2010-06-30 DIAGNOSIS — K2960 Other gastritis without bleeding: Secondary | ICD-10-CM

## 2010-06-30 LAB — RAPID STREP (POINT OF CARE): STREP SCREEN: POSITIVE

## 2010-06-30 MED ORDER — AMOXICILLIN 500 MG PO CAPS
500.00 mg | ORAL_CAPSULE | Freq: Three times a day (TID) | ORAL | Status: AC
Start: 2010-06-30 — End: 2010-07-10

## 2010-06-30 MED ORDER — OMEPRAZOLE 20 MG PO CPDR
20.0000 mg | DELAYED_RELEASE_CAPSULE | Freq: Two times a day (BID) | ORAL | Status: AC
Start: 2010-06-30 — End: 2010-07-31

## 2010-06-30 NOTE — Progress Notes (Signed)
Pt of Dr Theophilus Kinds w/ Review of Patient's Allergies indicates:   Ibuprofen               Swelling   Nsaids                  Rash, Swelling    Comment:Labial edema with alleve, rash as well   Pollen extract/tree*    Runny Nose    Comment:HA, itchy watery eyes      Current outpatient prescriptions:  amoxicillin (AMOXIL) 500 MG capsule Take 1 capsule by mouth 3 (three) times daily. Disp: 30 capsule Rfl: 0   oxycodone-acetaminophen (PERCOCET) 5-325 MG per tablet Take 1-2 tablets by mouth every 4 (four) hours as needed for Pain. 1-2 tablet every 4 hours prn pain Disp: 20 tablet Rfl: 0   oxycodone-acetaminophen (PERCOCET) 5-325 MG per tablet Take 1-2 tablets by mouth every 4 (four) hours as needed for Pain. 1-2 tablet every 4 hours prn pain Disp: 20 tablet Rfl: 0   metoprolol (TOPROL-XL) 50 MG 24 hr tablet Take 1 tablet by mouth daily. Disp: 30 tablet Rfl: 11   Cholecalciferol (VITAMIN D) 2000 UNIT tablet Take 2,000 Units by mouth daily. Disp: 30 tablet Rfl: 11       Patient Active Problem List:     LUMP OR MASS IN BREAST [611.72]     ESOPHAGEAL REFLUX [530.81]     GASTRITIS NEC W/O HEMORRH [535.40]     ABDOMINAL PAIN EPIGASTRIC [789.06]     ANGIONEUROTIC EDEMA [995.1]     Plantar Fasciitis, L [728.42F]     Leg Pain [729.5H]     Tobacco Abuse, in Remission [V15.82K]     Triggering of Finger, R 3rd [727.03G]     Right subacromial bursitis/rotator cuff tendinopathy [719.41X]     Chondromalacia Patellae [717.7A]     Lower Back Pain [724.2U]     De Quervain's Tenosynovitis, R [727.04L]     CTS (Carpal Tunnel Syndrome), b/l [354.0J]     Family History of Diabetes Mellitus (DM) [V18.0B]     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]     Intermittent Spinal Claudication [435.1BM]     Vitamin D Deficiency [268.9G]     Greater Trochanteric Bursitis, b/l [726.5S]     DDD (Degenerative Disc Disease), Lumbar [722.52G]     Obesity [278.00J]     SVT To 240 BPM March 2010 [427.89DZ]     Palpitations [785.1]    Here c/o sore throat x3  days;   "fever"; temp not checked but felt hot and cold  Referred pain in L ear when swallows only; no blood , no pus from ears  Concerned 2/2 went to [facility] ED 06/08/10; was told she had a + strep there and rx'd w/ PCN x 10 days, which she said she took completely  Felt better 3 d p onset of PCN and well till 3 d ago again; same sx  Pt denies taking care of children; no known + strep contacts        O: GEN: Well-looking  woman, NAD, afeb,   SKIN: nl, no rash  HEENT:CLear eyes, no rhinorrhea, inf turbinaes slt injected no max or frontal sinus pain, Op posteriorly very mildly injected, no exudate; tonsils +1/4 b/l, uvula midline, mobile, neck supple,b/l tonsillar nodes approx 1 cm; otherwise  no lymphadenopathy  CHEST CTA, no wheezes  COR:RRR, no m/g/r  20 min face to face; > 50% time counseling, coordinating care      034.40M Strep sore throat  (primary  encounter diagnosis)  Comment: had recently 06/08/10  Poss carrier vs reinfection  No sx of peritonsillar abcess at present  Plan:d dx disc'd RAPID STREP (POINT OF CARE), amoxicillin         (AMOXIL) 500 MG capsule      Med ed,se, worry sx, supp meas disc'd fully  RTC PRN concerns, unimproved, worse, worry sx (unable to swallow, persistent fever and/or pain, seek med attn advised)disc'd  Throw out old toothbrush p each illness rx'd disc'd  Disc'd if sx cont, will send to ENT  Pt stated she understood and agreed to do.      V76.10K Screening for breast cancer  Comment: due  Plan: ORDER FOR MAMMOGRAM (SCREENING)           535.40 GASTRITIS NEC W/O HEMORRH  Comment: hx of; req's refill omeprazole 2/2 "abx upset her stomach"  Plan: omeprazole (PRILOSEC) 20 MG capsule        Disc'd separate from other meds by 2-4 hrs  Med ed,se, worry sx, supp meas disc'd fully  RTC PRN concerns, unimproved, worse, worry sx disc'd  Pt stated she understood and agreed to do.

## 2010-07-28 ENCOUNTER — Encounter (HOSPITAL_BASED_OUTPATIENT_CLINIC_OR_DEPARTMENT_OTHER): Payer: Self-pay | Admitting: Internal Medicine

## 2010-08-09 ENCOUNTER — Ambulatory Visit (HOSPITAL_BASED_OUTPATIENT_CLINIC_OR_DEPARTMENT_OTHER): Payer: PRIVATE HEALTH INSURANCE | Admitting: Hand Surgery

## 2010-08-09 DIAGNOSIS — M6530 Trigger finger, unspecified finger: Secondary | ICD-10-CM

## 2010-08-09 MED ORDER — METHYLPREDNISOLONE ACETATE 20 MG/ML IJ SUSP
20.00 mg | Freq: Once | INTRAMUSCULAR | Status: AC
Start: 2010-08-09 — End: 2010-08-09

## 2010-08-09 MED ORDER — BUPIVACAINE HCL 0.5 % IJ SOLN
0.25 mL | Freq: Once | INTRAMUSCULAR | Status: AC
Start: 2010-08-09 — End: 2010-08-09

## 2010-08-09 NOTE — Progress Notes (Signed)
The primary progress note for this visit has been dictated through E-Scription. It can be viewed as an attachment to this encounter or through Chart Review under the Other Tab as an Orthopedic Office Note.      Review of Systems: Constitutional, Eyes, ENT/Mouth, Cardiovascular, Respiratory, GI, GU, Neuro, Psych, Heme/Lymph, Skin, Musculoskeletal was reviewed and is NEGATIVE except for what is dictated in the note.    MT ACCT #:  [id number]

## 2010-09-02 ENCOUNTER — Ambulatory Visit (HOSPITAL_BASED_OUTPATIENT_CLINIC_OR_DEPARTMENT_OTHER): Payer: Self-pay | Admitting: Internal Medicine

## 2010-09-12 ENCOUNTER — Ambulatory Visit (HOSPITAL_BASED_OUTPATIENT_CLINIC_OR_DEPARTMENT_OTHER): Payer: Self-pay | Admitting: Internal Medicine

## 2010-09-12 LAB — MA SCREENING MAMMO BILATERAL WITH CAD

## 2010-09-16 ENCOUNTER — Other Ambulatory Visit (HOSPITAL_BASED_OUTPATIENT_CLINIC_OR_DEPARTMENT_OTHER): Payer: Self-pay | Admitting: Internal Medicine

## 2010-09-16 DIAGNOSIS — I471 Supraventricular tachycardia, unspecified: Secondary | ICD-10-CM

## 2010-09-16 MED ORDER — METOPROLOL SUCCINATE ER 50 MG PO TB24
50.0000 mg | ORAL_TABLET | Freq: Every day | ORAL | Status: DC
Start: 2010-09-16 — End: 2011-10-16

## 2010-09-16 NOTE — Telephone Encounter (Signed)
Deborah [name] is a 45 year old female has requested a refill of      - metoprolol (TOPROL-XL) 50MG     In the previous order this was listed as "Tamperproof".  [facility] never got the prescription that was originally prescribed on 04/06/2010.  At your convenience please review and resend.    HTN Med:    Most Recent BP Reading(s)  06/30/10 : 100/60  06/08/10 : 125/88  04/20/10 : 135/77    Documented patient preferred pharmacies:  [facility] ([facility])Phone: [phone] Fax: [phone]

## 2010-09-16 NOTE — Telephone Encounter (Signed)
Message copied by Threasa Heads on Fri Sep 16, 2010  9:16 AM  ------       Message from: Anitra Lauth       Created: Thu Sep 15, 2010  4:46 PM       Regarding: DR Matilde Haymaker         Person calling on behalf of patient: Patient (self)              Medicine Name: TOPROL       Dosage: 50MG        Frequency (how many pills, how many times a day): ONCE DAILY       Number of pills left: HAS 1 PILL LEFT       Documented patient preferred pharmacies:       No Pharmacies Listed       Pharmacy Name: OUTPATIENT       Pharmacy Telephone Number:        Pharmacy  Fax Number:               [name] NUMBER: [phone]       Cell phone:        Other phone:              Available times:              Patient's language of care: Data Unavailable              Patient does not need an interpreter.

## 2010-09-20 NOTE — Progress Notes (Signed)
Date of Service: 08/09/2010    DIAGNOSIS:  Persistent right index and middle finger trigger finger.    HISTORY OF PRESENT ILLNESS:  This 45 year old right-handed female patient presents today with recurrent symptoms of right middle and index finger triggering.  She had the right index finger injected July of last year following a middle finger injection in June.  She has had trigger fingers for quite some time; even back in 2009 she had a middle finger injected.  She states the symptoms have been recurrent for the last 2 months.  She is having pain, snapping at the flexor tendon, even locking of the middle finger.  She would like to have these injected again.    PHYSICAL EXAMINATION:  GENERAL:  Reveals a 45 year old female patient in no acute distress, healthy appearing.  MUSCULOSKELETAL:  The right upper extremity reveals some mild swelling of the hand and into the index and middle finger.  She has locking demonstrated today of the middle finger with pain associated, no locking of the index finger, but obvious triggering noted with active flexion and extension of the digit.  Mild tenderness on palpation over the A1 pulley and flexor tendon associated with the index and middle fingers.  Gross sensation is intact to the fingertips, 2+ radial pulses noted.  The other fingers were examined individually and found to be negative for triggering.    IMPRESSION AND PLAN:  A 45 year old female patient with persistent index and middle finger trigger fingers.  She was seen and examined today also by Dr. Olam Idler.  We injected both the index and THE middle finger on the right with cortisone.  A total of 20 mg of Depo-Medrol and 0.25 mL of Marcaine was used in each individual injection.  Injections were performed without difficulty.  The patient tolerated the procedure well.  We will see her back for followup as needed.  In the meantime, she is to work on stretching the fingers to help with overall inflammation and use  anti-inflammatories as needed.    I, Dr. Michela Pitcher, have seen and examined this patient and agree with impression and plan.    ___________________________  Reviewed and Electronically Signed By: Resa Miner MD  Sig Date: 09/20/2010  Sig Time: 15:48:22  Dictated By: Juanetta Gosling PA-C  Dict Date: 08/09/2010 Dict Time: 09 24 AM    Dictation Date and Time:08/09/2010 09:24:37  Transcription Date and Time:08/09/2010 09:58:17  eScription Dictation id: [id number] Confirmation # :[id number]

## 2010-12-22 ENCOUNTER — Ambulatory Visit (HOSPITAL_BASED_OUTPATIENT_CLINIC_OR_DEPARTMENT_OTHER): Payer: PRIVATE HEALTH INSURANCE | Admitting: Internal Medicine

## 2010-12-22 VITALS — BP 110/80 | HR 88 | Temp 98.0°F

## 2010-12-22 DIAGNOSIS — R39198 Other difficulties with micturition: Secondary | ICD-10-CM

## 2010-12-22 DIAGNOSIS — N644 Mastodynia: Secondary | ICD-10-CM

## 2010-12-22 DIAGNOSIS — Z139 Encounter for screening, unspecified: Secondary | ICD-10-CM

## 2010-12-22 DIAGNOSIS — R42 Dizziness and giddiness: Secondary | ICD-10-CM

## 2010-12-22 DIAGNOSIS — R635 Abnormal weight gain: Secondary | ICD-10-CM

## 2010-12-22 DIAGNOSIS — R6889 Other general symptoms and signs: Secondary | ICD-10-CM

## 2010-12-22 LAB — URINE DIP (POINT OF CARE)
BILIRUBIN, URINE: NEGATIVE
GLUCOSE, URINE: NEGATIVE mg/dl
KETONE, URINE: NEGATIVE mg/dl
NITRITE, URINE: NEGATIVE
PH URINE: 7 (ref 5.0–8.0)
PROTEIN, URINE: NEGATIVE mg/dl (ref 0–15)
SPECIFIC GRAVITY URINE: 1.02 (ref 1.003–1.030)
UROBILINOGEN URINE: 1 mg/dl (ref 0.2–1.0)

## 2010-12-27 LAB — TSH (THYROID STIMULATING HORMONE): TSH (THYROID STIM HORMONE): 1.31 u[IU]/mL (ref 0.34–5.60)

## 2010-12-27 LAB — BASIC METABOLIC PANEL
ANION GAP: 9 mmol/L (ref 3–11)
BUN (UREA NITROGEN): 17 mg/dl (ref 6–20)
CALCIUM: 9.8 mg/dl (ref 8.6–10.3)
CARBON DIOXIDE: 30 mmol/L (ref 22–32)
CHLORIDE: 104 mmol/L (ref 101–111)
CREATININE: 1 mg/dl (ref 0.4–1.2)
ESTIMATED GLOMERULAR FILT RATE: 60 mL/min (ref >60)
Glucose Random: 89 mg/dl (ref 74–160)
POTASSIUM: 4.5 mmol/L (ref 3.5–5.1)
SODIUM: 143 mmol/L (ref 135–144)

## 2010-12-27 LAB — VITAMIN D,25 HYDROXY: VITAMIN D,25 HYDROXY: 32.3 ng/ml (ref 30.0–100.0)

## 2010-12-27 LAB — CHG GONADOTROPIN LUTEINIZING HORMONE: LUTEINIZING HORMONE (LH): 39.56 m[IU]/mL

## 2010-12-27 LAB — CHG GONADOTROPIN FOLLICLE STIMULATING HORMONE: FOLLICLE STIMULATING HORMONE: 80.13 m[IU]/mL

## 2011-01-06 NOTE — Progress Notes (Signed)
SUBJECTIVE:  Deborah [name] is a 45 year old female who presents with multiple concerns. She reports over last 2 weeks, when she needs to urinate, she feels "heat" like she was going to be incontinent, but is not incontinent of urine. She also feels like breasts have become enalarged and are painful. She tried Ibuprofen over last 3 days. She also has tried tylenol, but not every day. She also noted "dizzyness" without vertigo, no syncope. Her LMP was 4 years ago. She notes recent weight gain. Also feels fatigued, and feels cold especially at night. She also for Vitamin d level check.    ROS: no n/v/abdominal pain/vaginal discharge/HA/vision changes/breast discharge. At baseline chronic LBP with sciatica sx's in R leg.    MEDICATIONS:    Current outpatient prescriptions:  acetaminophen (TYLENOL) 325 MG tablet Take 650 mg by mouth every 6 (six) hours as needed. Disp:  Rfl:    metoprolol (TOPROL-XL) 50 MG 24 hr tablet Take 1 tablet by mouth daily. Disp: 30 tablet Rfl: 11         ALLERGIES:  Review of Patient's Allergies indicates:   Ibuprofen               Swelling   Nsaids                  Rash, Swelling    Comment:Labial edema with alleve, rash as well   Pollen extract/tree*    Runny Nose    Comment:HA, itchy watery eyes    OBJECTIVE:      PHYSICAL EXAM:     12/22/10  1721   BP: 110/80   Pulse: 88   Temp: 98 F (36.7 C)   TempSrc: Temporal   SpO2: 99%       Constitutional: Obese, appears with depressed affect, No acute distress  HEENT: Wears glasses, Normocephalic, Atraumatic, Bilateral external ears normal, Oropharynx moist, No oral exudates, Nose normal, + pterygium, PERRLA.  Cardiovascular: Normal heart rate, Normal rhythm, No murmurs, No rubs, No gallops.   Thorax & Lungs: Normal breath sounds, No respiratory distress, No wheezing, No chest tenderness.   Abdomen: Soft, Non tender, No guarding, No masses, Normal bowel sounds.  Extremities: Intact distal pulses, No edema.  Neurologic:  No  focal deficits noted.     ASSESSMENT/PLAN:   Urinary sx's - possible UTI, will check urine  nd body aches/cold intolerance/wt gain - concerning thyroid dysfunction. Will check TSH, also she noted her breast symptoms similar to when she had menstrual periods, will check LH, FSH  Vitamin D - will check level  Dizzyness - will check CBC/metabolic panel to rule out lyte abnormalities or anemia.      PLAN:  780.99AP Cold intolerance  783.1L Weight gain  611.71W Breast tenderness in female  60.32M Urinary dysfunction  780.4A Dizziness      1. The patient indicates understanding of these issues and agrees with the plan.  2.  The patient is given an After Visit Summary sheet that lists all of their medications with directions, their allergies, orders placed during this encounter, immunization dates, and follow- up instructions.  3. I reviewed the patient's medical information and medical history   4.  I reconciled the patient's medication list and prepared and supplied needed refills.  5.  I have reviewed the past medical, family, and social history sections including the medications and allergies listed in the above medical record      Electronically signed by: Janine Ores. Marthann Schiller, MD, 01/06/2011 1:19  PM  This note is electronically signed in the electronic medical record.

## 2011-01-10 ENCOUNTER — Encounter (HOSPITAL_BASED_OUTPATIENT_CLINIC_OR_DEPARTMENT_OTHER): Payer: Self-pay | Admitting: Internal Medicine

## 2011-04-07 ENCOUNTER — Ambulatory Visit (HOSPITAL_BASED_OUTPATIENT_CLINIC_OR_DEPARTMENT_OTHER): Payer: PRIVATE HEALTH INSURANCE | Admitting: Internal Medicine

## 2011-04-07 ENCOUNTER — Encounter (HOSPITAL_BASED_OUTPATIENT_CLINIC_OR_DEPARTMENT_OTHER): Payer: Self-pay | Admitting: Internal Medicine

## 2011-04-07 VITALS — BP 100/76 | HR 88 | Wt 170.2 lb

## 2011-04-07 DIAGNOSIS — I471 Supraventricular tachycardia, unspecified: Secondary | ICD-10-CM

## 2011-04-07 DIAGNOSIS — R002 Palpitations: Secondary | ICD-10-CM

## 2011-04-07 MED ORDER — OMEPRAZOLE 20 MG PO CPDR
20.00 mg | DELAYED_RELEASE_CAPSULE | Freq: Every day | ORAL | Status: AC
Start: 2011-04-07 — End: 2012-04-06

## 2011-04-07 NOTE — Progress Notes (Signed)
This office note has been dictated. Account number [id number]

## 2011-04-07 NOTE — Progress Notes (Signed)
Safe at home.
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
## 2011-04-18 ENCOUNTER — Ambulatory Visit (HOSPITAL_BASED_OUTPATIENT_CLINIC_OR_DEPARTMENT_OTHER): Payer: Self-pay | Admitting: Internal Medicine

## 2011-04-18 LAB — ECHOCARDIOGRAM W/ DOPPLER

## 2011-04-21 LAB — EKG-12 ** TO BE DONE BY EKG **

## 2011-04-25 ENCOUNTER — Ambulatory Visit (HOSPITAL_BASED_OUTPATIENT_CLINIC_OR_DEPARTMENT_OTHER): Payer: PRIVATE HEALTH INSURANCE | Admitting: Internal Medicine

## 2011-04-25 ENCOUNTER — Encounter (HOSPITAL_BASED_OUTPATIENT_CLINIC_OR_DEPARTMENT_OTHER): Payer: Self-pay | Admitting: Internal Medicine

## 2011-04-25 VITALS — BP 102/70 | HR 71 | Ht 61.5 in | Wt 172.0 lb

## 2011-04-25 DIAGNOSIS — I471 Supraventricular tachycardia, unspecified: Secondary | ICD-10-CM

## 2011-04-25 DIAGNOSIS — R002 Palpitations: Secondary | ICD-10-CM

## 2011-04-25 NOTE — Progress Notes (Signed)
Patient feels safe at home.

## 2011-04-25 NOTE — Progress Notes (Signed)
Ct  This office note has been dictated. Account number [id number]
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
## 2011-05-03 LAB — HOLTER MONITORING

## 2011-05-31 ENCOUNTER — Encounter (HOSPITAL_BASED_OUTPATIENT_CLINIC_OR_DEPARTMENT_OTHER): Payer: Self-pay | Admitting: Emergency Medicine

## 2011-05-31 ENCOUNTER — Emergency Department (HOSPITAL_BASED_OUTPATIENT_CLINIC_OR_DEPARTMENT_OTHER)
Admission: RE | Admit: 2011-05-31 | Disposition: A | Discharge status: Home / Self Care | Payer: Self-pay | Source: Emergency Department | Attending: Emergency Medicine | Admitting: Emergency Medicine

## 2011-05-31 MED ORDER — KETOROLAC TROMETHAMINE 30 MG/ML IJ SOLN
30.00 mg | Freq: Once | INTRAMUSCULAR | Status: AC
Start: 2011-05-31 — End: 2011-05-31
  Administered 2011-05-31: 30 mg via INTRAVENOUS
  Filled 2011-05-31: qty 1

## 2011-05-31 MED ORDER — ONDANSETRON 4 MG PO TBDP
4.00 mg | ORAL_TABLET | Freq: Four times a day (QID) | ORAL | Status: AC | PRN
Start: 2011-05-31 — End: 2011-06-07

## 2011-05-31 MED ORDER — ONDANSETRON HCL 4 MG/2ML IJ SOLN
4.00 mg | Freq: Once | INTRAMUSCULAR | Status: AC
Start: 2011-05-31 — End: 2011-05-31
  Administered 2011-05-31: 4 mg via INTRAVENOUS
  Filled 2011-05-31: qty 2

## 2011-05-31 MED ORDER — SODIUM CHLORIDE 0.9 % IV BOLUS
1000.00 mL | Freq: Once | INTRAVENOUS | Status: AC
Start: 2011-05-31 — End: 2011-05-31
  Administered 2011-05-31: 1000 mL via INTRAVENOUS

## 2011-05-31 MED ORDER — IBUPROFEN 600 MG PO TABS
600.0000 mg | ORAL_TABLET | Freq: Three times a day (TID) | ORAL | Status: DC | PRN
Start: 2011-05-31 — End: 2012-02-27

## 2011-05-31 NOTE — ED Notes (Signed)
Pt's I.V. D'ced.  Pt voicing no c/o discomfort

## 2011-05-31 NOTE — Discharge Instructions (Signed)
Plenty of fluids.  Advance diet as tolerated.  Meds as directed.  Return at any time if worse, or for new or different symptoms.  Call your doctor tomorrow with a progress report, and to arrange follow up.

## 2011-05-31 NOTE — ED Provider Notes (Signed)
eMERGENCY dEPARTMENT ATTENDING NOTE    The patient was seen primarily by this physician.   The ED nursing record was reviewed.   The prior medical records as available electronically through Epic were reviewed.  The mode of arrival was Relative on 05/31/2011 10:06 AM.    CHIEF COMPLAINT    Patient presents with:    Muscle Pain - VOMITING, DIARRHEA , HEADACHE    Abdominal Pain    Diarrhea      HPI    Deborah [name] is a 46 year old female who complains of more than 7 episodes of diarrhea since about 10:00 AM yesterday.  She has had repeated dry heaves.  She describes diffuse myalgia.  She has a moderate headache.  She has some crampy abdominal pain associated with episodes of diarrhea.  She states her mouth feels dry.  She has felt feverish, but has not taken her temperature.  She denies cough.  No nasal congestion.  No sore throat.  No stiff neck.  No change in mental status.  She took Motrin 600 milligrams with slight improvement in her symptoms.    PAST MEDICAL HISTORY      Past Medical History    Irregular menstrual cycle     Pregnant state, incidental     Comment: c/s breech    Esophageal reflux     SVT (supraventricular tachycardia) 3/10    Comment: TCH         PROBLEM LIST  Patient Active Problem List:     LUMP OR MASS IN BREAST [611.72]     ESOPHAGEAL REFLUX [530.81]     GASTRITIS NEC W/O HEMORRH [535.40]     ABDOMINAL PAIN EPIGASTRIC [789.06]     ANGIONEUROTIC EDEMA [995.1]     Plantar Fasciitis, L [728.88F]     Leg Pain [729.5H]     Tobacco Abuse, in Remission [V15.82K]     Triggering of Finger, R 3rd [727.03G]     Right subacromial bursitis/rotator cuff tendinopathy [719.41X]     Chondromalacia Patellae [717.7A]     Lower Back Pain [724.2U]     De Quervain's Tenosynovitis, R [727.04L]     CTS (Carpal Tunnel Syndrome), b/l [354.0J]     Family History of Diabetes Mellitus (DM) [V18.0B]     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 OMEPRAZOLE 20 MG PO CPDR  Route: Oral ZOX:WRUE 1 capsule by mouth daily.  Dispense: 30 capsule Refill: 2  2. ACETAMINOPHEN 325 MG PO TABS  Route: Oral AVW:UJWJ 650 mg by mouth every 6 (six) hours as needed.  Dispense:  Refill:   3. METOPROLOL SUCCINATE ER 50 MG PO TB24  Route: Oral XBJ:YNWG 1 tablet by mouth daily.  Dispense: 30 tablet Refill: 11  4. ONDANSETRON 4 MG PO TBDP  Route:  Oral WRU:EAVW 1 tablet by mouth every 6 (six) hours as needed for Nausea.  Dispense: 5 tablet Refill: 0  5. IBUPROFEN 600 MG PO TABS  Route: Oral UJW:JXBJ 1 tablet by mouth every 8 (eight) hours as needed for Pain. pain  Dispense: 20 tablet Refill: 0    ALLERGIES    Review of Patient's Allergies indicates:   Pollen extract/tree*    Runny Nose    Comment:HA, itchy watery eyes    FAMILY HISTORY      Family History    Hypertension Mother     Hypertension Father     Diabetes Mother     Diabetes Maternal Uncle     Diabetes Maternal Uncle     Cancer - Other FamHxNeg     Cancer - Breast FamHxNeg     Cancer - Colon FamHxNeg     Cancer - Lung FamHxNeg     Cancer - Ovarian FamHxNeg     Heart Maternal Grandmother     Heart Maternal Uncle     Heart Maternal Uncle          SOCIAL HISTORY    Social History    Marital Status: Married             Spouse Name:                       Years of Education:                 Number of children: 1             Occupational History  Occupation          Technical sales engineer                               Social History Main Topics    Smoking Status: Former  Smoker                   Packs/Day: .5    Years: 27        Types: Cigarettes      Quit date: 06/28/2002    Alcohol Use: No              Drug Use: No              Sexual Activity: Not Currently     Partners with: Female       Birth Control/Protection: Pill       Comment: no STI hx; HIV neg 2002    Other Topics            Concern  Stress Concern          Yes    Comment:rev  Weight Concern          Yes    Comment:rev  Special Diet            Yes    Comment:rev  Exercise                No  Seat Belt               Yes    Comment:rev  Self-Exams              Yes    Comment:rev  Social History Narrative    [place], [place]. To [place] 2000.    Lives with her son and her parents. Divorced from husband.    Denies DV. No guns in home.        REVIEW OF SYSTEMS    The pertinent positives are reviewed in the HPI above. All other systems were reviewed and are negative.    PHYSICAL EXAM    Vital Signs: BP 119/74   Pulse 98   Temp(Src) 98.2 F (Oral)   Resp 18   Wt 77.111 kg   SpO2 100%   LMP 10/17/2006   GENERAL:  Alert and appropriate.  No apparent distress.  Non-toxic appearing.  SKIN:  Warm and dry, no rash.  The skin color and turgor are normal.  HEAD:  Without signs of trauma.  No soft tissue swelling or tenderness.  NECK:  No tenderness, swelling, or step-off.  Full range of motion without discomfort.  Supple with no meningismus.  EYES:  Pupils are equal and reactive.  Extraocular movements are intact.  No scleral icterus.  ENT:  Clear.  Mucus membranes are somewhat dry.  LYMPHATICS:  No palpable cervical lymphadenopathy.  LUNGS:  Clear to auscultation bilaterally. No wheezes, rales, or rhonchi.   HEART: Tachycardic regular rate and rhythm.  No murmurs or rubs.  ABDOMEN:  Soft and non-distended.  Completely without tenderness, masses or organomegaly.  MUSCULOSKELETAL:  The extremities are grossly normal times four.  NEUROLOGIC:  Normal mental status.  Cranial nerves, motor, sensory, DTRs, and cerebellum are grossly  intact.  GENITOURINARY:  No CVA tenderness.   PSYCHIATRIC:  Normal affect.    RESULTS  No results found for this visit on 05/31/11 (from the past 24 hour(s)).     MEDICATIONS ADMINISTERED ON THIS VISIT    Medication Orders Placed This Encounter  sodium chloride 0.9 % IV bolus 1,000 mL   Sig:   sodium chloride 0.9 % IV bolus 1,000 mL   Sig:   ondansetron (ZOFRAN) injection 4 mg   Sig:   ketorolac (TORADOL) injection 30 mg   Sig:   ondansetron (ZOFRAN ODT) 4 MG disintegrating tablet   Sig: Take 1 tablet by mouth every 6 (six) hours as needed for Nausea.   Dispense:  5 tablet   Refill:  0  ibuprofen (ADVIL,MOTRIN) 600 MG tablet   Sig: Take 1 tablet by mouth every 8 (eight) hours as needed for Pain. pain   Dispense:  20 tablet   Refill:  0  sodium chloride 0.9 % IV bolus 1,000 mL   Sig:     ED COURSE & MEDICAL DECISION MAKING    Pertinent Labs & Imaging studies reviewed.   This patient appears to be suffering from an enteric infection with repeated vomiting and diarrhea.  She appears clinically dehydrated.  An intravenous was established.  The patient was given normal saline IV wide until she voided greater than 300 mL.  This required 3 liters of fluid resuscitation, indicating moderately severe dehydration.  She felt much better after these treatments.  She still had a very mild headache, but otherwise felt quite well.    She is advised to drink plenty of fluids.  She is given Zofran for use at home.  She is advised to call her primary care provider in the morning with a progress report, and to arrange followup.  Reasons to return to the emergency are reviewed in detail.    NEW PRESCRIPTIONS FROM THIS VISIT  New Prescriptions  IBUPROFEN (ADVIL,MOTRIN) 600 MG TABLET    Take 1 tablet by mouth every 8 (eight) hours as needed for Pain. pain    ONDANSETRON (ZOFRAN ODT) 4 MG DISINTEGRATING TABLET    Take 1 tablet by mouth every 6 (six) hours as needed for Nausea.          IMPRESSION  787.03B Vomiting  787.91  Diarrhea  276.51 Dehydration    DISPOSITION  Discharged    CONSULTS  None    FOLLOW-UP  Follow-up Information    Follow up With Details Comments Contact Info    Gwenith Spitz, MD Call in 1 day with a progress report, and to arrange followup. [address] [address]  [phone]    [facility] EMERGENCY  Return at any time if worse, or if you develop new or different symptoms, or if you are unable to arrange followup as advised. [address]  [place] [address]  [phone]            All the patient's questions were answered.  The patient expressed good understanding of the aftercare instructions and follow up plan.    Electronically signed by: Johnsie Kindred, MD FACEP, 05/31/2011 12:52 PM

## 2011-05-31 NOTE — ED Triage Note (Signed)
Pt reporting generalized body aches, abdominal pain, headache, fever and diarrhea.  States symptoms started yesterday, has been taking ibuprofen with some relief, last dose was 600mg  at 0600 today.

## 2011-05-31 NOTE — ED Notes (Signed)
Pt gowned.

## 2011-05-31 NOTE — ED Notes (Signed)
Patient Disposition    Patient education for diagnosis, medications, activity, diet and follow-up.  Patient left ED 1:53 PM.  Patient rep received written instructions.  Interpreter to provide instructions: No    Discharged to: Discharged to home.  Instructions provide per MD order.  2 prescriptions given to patient.  Understands how to take them.  IV d/c'd by RN.  DSD applied. Pt states that she feels better. Pt dressed and ambulated out of dept.

## 2011-05-31 NOTE — ED Notes (Signed)
Pt ambulatory to restroom without difficulty.

## 2011-06-01 ENCOUNTER — Telehealth (HOSPITAL_BASED_OUTPATIENT_CLINIC_OR_DEPARTMENT_OTHER): Payer: Self-pay | Admitting: Registered Nurse

## 2011-06-01 NOTE — Telephone Encounter (Signed)
Called pt  For ED follow up r/t   diarrhea  Using tele interpreter,    Pt  Cont to have abd pain,  Motrin used for pain 600 mg  Used with some affect.  Suggested to pt to try Tylenol    R/t an empty stomach,  May increase discomfort,  With diarrhea  Diarrhea  6 times last pm,  Today no diarrhea    Zofran used,  Pt is able to tolerate fluids,  No vomiting  Pt is not eating any foods,  Pt declined a follow up now,  Will call as needed,

## 2011-09-19 ENCOUNTER — Encounter (HOSPITAL_BASED_OUTPATIENT_CLINIC_OR_DEPARTMENT_OTHER): Payer: Self-pay

## 2011-10-16 ENCOUNTER — Other Ambulatory Visit (HOSPITAL_BASED_OUTPATIENT_CLINIC_OR_DEPARTMENT_OTHER): Payer: Self-pay | Admitting: Internal Medicine

## 2011-10-16 DIAGNOSIS — I471 Supraventricular tachycardia, unspecified: Secondary | ICD-10-CM

## 2011-10-16 MED ORDER — METOPROLOL SUCCINATE ER 50 MG PO TB24
50.0000 mg | ORAL_TABLET | Freq: Every day | ORAL | Status: DC
Start: 2011-10-16 — End: 2012-10-21

## 2011-10-16 NOTE — Progress Notes (Signed)
Person calling on behalf of patient: Pharmacy    Deborah [name] is a 46 year old female       - medication(s) request: Toprol XL 25 mg  - last office visit: 04/25/2011  - last physical exam: 05/19/2009      HTN Med:    Most Recent BP Reading(s)  05/31/11 : 112/71  04/25/11 : 102/70  04/07/11 : 100/76        Documented patient preferred pharmacies:    [facility] ([facility])  Phone: [phone] Fax: [phone]

## 2011-10-16 NOTE — Progress Notes (Signed)
Chart reviewed. Routing to prescribing provider, Dr. Matilde Haymaker (cardiology).

## 2011-10-16 NOTE — Telephone Encounter (Signed)
Message copied by Stann Ore on Mon Oct 16, 2011  2:18 PM  ------       Message from: Bennye Alm       Created: Mon Oct 16, 2011  2:07 PM       Regarding: [place]         CMS MEDICAL SPEC              Person calling on behalf of patient: Patient (self)              May list multiple medications in this section              Medicine Name: metoprolol (TOPROL-XL) 50 MG 24 hr tablet               Dosage:               Frequency (how many pills, how many times a day):               Number of pills left:               Documented patient preferred pharmacies:        [facility] ([facility])       Phone: [phone] Fax: [phone]                     Pharmacy Name: [facility]              Pharmacy Telephone Number:               Pharmacy  Fax Number:               CALL BACK NUMBER:               Cell phone:                Other phone:               Available times:               Patient's language of care: [place]              Patient needs a  interpreter.                       ------

## 2011-10-24 ENCOUNTER — Ambulatory Visit (HOSPITAL_BASED_OUTPATIENT_CLINIC_OR_DEPARTMENT_OTHER): Payer: PRIVATE HEALTH INSURANCE | Admitting: Internal Medicine

## 2011-10-24 DIAGNOSIS — I471 Supraventricular tachycardia, unspecified: Secondary | ICD-10-CM

## 2011-10-24 DIAGNOSIS — R002 Palpitations: Secondary | ICD-10-CM

## 2011-10-24 NOTE — Progress Notes (Signed)
This office note has been dictated. Account number [id number]

## 2011-10-25 NOTE — Progress Notes (Signed)
Date of Service: 10/24/2011    October 24, 2011    Gwenith Spitz, MD  [facility]  [address]  [place], [place]  [address]    RE:  Kazue Cerro    Dear Cleone Slim:    I saw Ms. Granderson in the office on 16 July in followup of her prior visit with me of 15 January of this year.  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    Her meds today include only metoprolol 50 and omeprazole 20.  There are no drug allergies.    Family history is pertinent for heart disease in a maternal uncle and a maternal grandmother.  Socially, she uses no substances and is a native of [place], immig[place] ago.  She is divorced, but she lives with her parents and her son.    She is pleased to report that she had knee pain when she saw me last and that she subsequently lost 23 pounds and the knees are much better.    Ms. Delahoz reports periodically awakening at night and having 3-10 seconds of rapid palpitations with a little chest constriction, but it never lasts longer than that and does not occur during the day.  She fills out our 12-system ROS form and mentions headaches and some nocturnal dyspnea, but all other systems are reviewed and are negative.    Her most recent Holter was in January and was good for 24 hours.  It showed sinus at 80 with a range of 45-140 with 1 APC, but no sequentials and no ventricular ectopy whatever.  There were no AV conduction disorders and no symptoms.    The EKG from January revealed, to my personal review now, sinus at 102 and is completely normal.    She had an echo in January that was completely normal.    The prior Holter from 2011 was similar in that it showed almost no ectopy whatever.    Her last bloods were last September and showed a normal BMP and TSH.    ADDENDUM:    Her last CBC was in August 2011 and was  completely normal.    Today, she stands 5 feet 5 inches and weighs 158 pounds, down from 172 six months ago.  Ocular muscles are intact, and the tongue is midline with symmetric face and good dentition.  No palpable cervical nodes or thyroid and no JVD.  The chest is clear.  The heart is regular without murmurs or gallops.  The abdomen is soft without palpable organ, mass, or tenderness.  There is no rash, clubbing, cyanosis, or edema.  She is fully oriented and has appropriate affect.    She has an anatomically normal heart, and her _____ are normal, so this is an isolated electrical anomaly of her heart.  The current regimen of metoprolol seems to be standing her well.  A few seconds of nocturnal palpitations do not bother me as long as they are confined to that shorter duration, and they seem to be.  I do not think I should alter the current regimen but rather asked her to call me if she has a change in her pattern.  She is enjoying her life and is not having any sustained arrhythmias.  If she were to evolve into that, I would  change her regimen promptly to a specific antiarrhythmic drug such as sotalol.    I am very pleased to see how well she is doing, and I would like to see her again in 6 months.  She will call me sooner if she is having a change in her palpitation pattern.    Thank you for the privilege of participating in her care.    Sincerely,      ___________________________  Reviewed and Electronically Signed By: Allegra Grana MD  Sig Date: 10/28/2011  Sig Time: 00:12:49  Dictated By: Allegra Grana MD  Dict Date: 10/24/2011 Dict Time: 04 40 PM    Dictation Date and Time:10/24/2011 16:40:38  Transcription Date and Time:10/24/2011 17:43:17  eScription Dictation id: [id number] Confirmation # :[id number]      cc: Gwenith Spitz MD    DICTATED BY: Allegra Grana MDTHOMAS Westen Dinino MDD:10/24/2011 16:40:38 T:10/24/2011 17:43:17 KN Job#: [id number]

## 2012-01-02 ENCOUNTER — Encounter (HOSPITAL_BASED_OUTPATIENT_CLINIC_OR_DEPARTMENT_OTHER): Payer: Self-pay

## 2012-01-03 ENCOUNTER — Emergency Department (HOSPITAL_BASED_OUTPATIENT_CLINIC_OR_DEPARTMENT_OTHER)
Admission: RE | Admit: 2012-01-03 | Disposition: A | Discharge status: Home / Self Care | Payer: Self-pay | Source: Emergency Department | Attending: Emergency Medicine | Admitting: Emergency Medicine

## 2012-01-03 ENCOUNTER — Encounter (HOSPITAL_BASED_OUTPATIENT_CLINIC_OR_DEPARTMENT_OTHER): Payer: Self-pay | Admitting: Emergency Medicine

## 2012-01-03 MED ORDER — LORAZEPAM 0.5 MG PO TABS
0.50 mg | ORAL_TABLET | Freq: Once | ORAL | Status: AC
Start: 2012-01-03 — End: 2012-01-03
  Administered 2012-01-03: 0.5 mg via ORAL
  Filled 2012-01-03: qty 1

## 2012-01-03 MED ORDER — MECLIZINE HCL 25 MG PO TABS
25.00 mg | ORAL_TABLET | Freq: Three times a day (TID) | ORAL | Status: AC | PRN
Start: 2012-01-03 — End: 2012-01-08

## 2012-01-03 MED ORDER — MECLIZINE HCL 25 MG PO TABS
25.00 mg | ORAL_TABLET | Freq: Once | ORAL | Status: AC
Start: 2012-01-03 — End: 2012-01-03
  Administered 2012-01-03: 25 mg via ORAL
  Filled 2012-01-03: qty 1

## 2012-01-03 NOTE — ED Notes (Signed)
Care assumed of pt from previous shift RN  1100.  Pt assessed,  "feeling better" after meds, v/s repeated = wnl.  Written and verbal d/c intructions given, reviewed with prescribtions.  Pt d/ced from ED

## 2012-01-03 NOTE — ED Triage Note (Signed)
46 year old female presents ambulatory to triage complaining of dizzy sudden onset since 4 am also nausea denies vomit or diarrhea

## 2012-01-03 NOTE — ED Notes (Signed)
Pt up and ambulating.  Feels a bit better.  Pt given .5 mg of Ativan.  Will re assess.

## 2012-01-03 NOTE — ED Provider Notes (Signed)
eMERGENCY dEPARTMENT eNCOUnter    CHIEF COMPLAINT    Patient presents with:    Dizziness - DIZZINESS      HPI    Deborah [name] is a 46 year old female who presents with dizziness that began when the patient woke up at 4 AM.  States that the room is spinning.  Denies lightheadedness, chest pain, or shortness of breath.  No hx of DM, HTN, or hyperlipidemia.  Does report nausea.  No ear pain or hearing loss.   01/03/2012  9:20 AM.    PAST MEDICAL HISTORY      Past Medical History    Irregular menstrual cycle     Pregnant state, incidental     Comment: c/s breech    Esophageal reflux     SVT (supraventricular tachycardia) 3/10    Comment: TCH       SURGICAL HISTORY        Past Surgical History    [place] BREAST BIOPSY SPECIMEN      Comment s/p fibroadenoma on the L,     SEPTOPLASTY/SUBMUCOUS RESECJ W/WO CARTILAGE GRF      CESAREAN DELIVERY ONLY      Comment breech       CURRENT MEDICATIONS    1. metoprolol (TOPROL-XL) 50 MG 24 hr tablet  Route: Oral GNF:AOZH 1 tablet by mouth daily.  Dispense: 30 tablet Refill: 11    2. EXPIRED: ibuprofen (ADVIL,MOTRIN) 600 MG tablet  Route: Oral YQM:VHQI 1 tablet by mouth every 8 (eight) hours as needed for Pain. pain  Dispense: 20 tablet Refill: 0    3. omeprazole (PRILOSEC) 20 MG capsule  Route: Oral ONG:EXBM 1 capsule by mouth daily.  Dispense: 30 capsule Refill: 2    4. acetaminophen (TYLENOL) 325 MG tablet  Route: Oral WUX:LKGM 650 mg by mouth every 6 (six) hours as needed.  Dispense:  Refill:       ALLERGIES    Review of Patient's Allergies indicates:   Pollen extract-tree*    Runny Nose    Comment:HA, itchy watery eyes    FAMILY HISTORY      Family History    Hypertension Mother     Hypertension Father     Diabetes Mother     Diabetes Maternal Uncle     Diabetes Maternal Uncle     Cancer - Other FamHxNeg     Cancer - Breast FamHxNeg     Cancer - Colon FamHxNeg     Cancer - Lung FamHxNeg     Cancer - Ovarian FamHxNeg     Heart Maternal Grandmother     Heart Maternal Uncle      Heart Maternal Uncle        SOCIAL HISTORY    Social History    Marital Status: Married             Spouse Name:                       Years of Education:                 Number of children: 1             Occupational History  Occupation          Technical sales engineer  Social History Main Topics    Smoking Status: Former Smoker                   Packs/Day: 0.50  Years: 27        Types: Cigarettes      Quit date: 06/28/2002    Alcohol Use: No              Drug Use: No              Sexual Activity: Not Currently     Partners with: Female       Birth Control/Protection: Pill       Comment: no STI hx; HIV neg 2002    Other Topics            Concern  Stress Concern          Yes    Comment:rev  Weight Concern          Yes    Comment:rev  Special Diet            Yes    Comment:rev  Exercise                No  Seat Belt               Yes    Comment:rev  Self-Exams              Yes    Comment:rev    Social History Narrative    [place], [place]. To [place] 2000.    Lives with her son and her parents. Divorced from husband.    Denies DV. No guns in home.        REVIEW OF SYSTEMS    All other systems negative except as marked.     PHYSICAL EXAM      Vital Signs: BP 147/89   Pulse 74   Temp(Src) 97.2 F   Resp 16   Wt 77.111 kg (170 lb)   BMI 31.6 kg/m2   SpO2 100%   LMP 10/17/2006     Constitutional:  Well appearing in no acute distress    HENT:  Normocephalic, Atraumatic, no clear nystagmus    Neck: Normal range of motion.    Respiratory:  Clear to auscultation.     Cardiovascular:  RRR. Nl s1, s2     Abdominal:   S/NT/ND.      Musculoskeletal:  Good range of motion in all joints    Skin:  Warm, Dry    Neurological:  No focal deficits noted.   Moving all extremities.  Cn 2-12 intact, s/s intact, fnf intact.  Normal gait.  No pronator drift.      PROCEDURES  none      ED COURSE & MEDICAL DECISION MAKING    Pertinent Labs & Imaging studies reviewed.     This is a 72 F who  presents with vertigo with nausea.  She has a nonfocal neurologic examination and reassuring vital signs.    A CT scan was obtained to evaluate for bleeding but also to help establish whether there was any burden of CAD which could be used to justify admission for MRI.    Pt's CT was reassuring.    Patient was treated with meclizine and with ativan.    After these interventions, the patient felt much better.  She was given a prescription for  Meclizine for home.    Vertigo instructions were provided.    CONDITION:  improved  DISPOSITION  home    FINAL IMPRESSION  vertigo      FOLLOW UP PLAN:  Neurology PRN        [name], MD  Emergency Room Attending Physician  [facility]  Associate Director of Medical Toxicology  Instructor in Medicine  [facility]

## 2012-01-03 NOTE — Discharge Instructions (Signed)
Vertigem (tontura)  (Vertigo, Dizziness)  Seu exame mostra que você teve um episódio de tontura ou vertigem. A tontura pode ser causada por muitos problemas diferentes, incluindo pressão baixa, estreitamento dos vasos cerebrais, problemas neurológicos ou cardíacos, baixo nível de açúcar no sangue e estados emocionais. A verdadeira vertigem causa uma falsa sensação de movimento, como sensação de estar rodando ou que as paredes se movem. A vertigem geralmente piora quando mudamos de posição e é aliviada com o descanso. Acredita-se muitas vezes que esse tipo de vertigem seja causado por uma infecção por vírus envolvendo o ouvido interno, ou raramente uma dor de cabeça de enxaqueca. O tratamento da vertigem inclui:  Descansar na cama e tomar líquidos claros.   Medicamentos para reduzir a tontura, náusea e vômito.   Evitar bebidas alcoólicas, tranquilizantes, nicotina, cafeína e uso de drogas.   Na maioria das vezes, a vertigem benigna melhora muito após 3 dias. No entanto, uma instabilidade leve pode durar até três meses em alguns pacientes. Um exame de ressonância magnética ou outros exames especiais para avaliar sua audição e equilíbrio podem ser necessários, caso a vertigem não melhore ou ocorra novamente. Consulte seu médico ou vá até a emergência se apresentar qualquer um dos seguintes sinais de alerta:  Aumento da vertigem, dor de ouvido, drenagem no ouvido ou perda da audição.   Forte dor de cabeça ou visão dupla, dificuldade de caminhar.   Desmaio, fraqueza extrema, dor no tórax ou palpitações.   Febre, vômito persistente ou desidratação.   Dormência ou fraqueza nos membros.   Document Released: 01/21/2009   ExitCare® Patient Information ©2012 [employer].

## 2012-01-06 LAB — EKG

## 2012-01-10 LAB — CT HEAD WO CONTRAST

## 2012-01-28 ENCOUNTER — Emergency Department (HOSPITAL_BASED_OUTPATIENT_CLINIC_OR_DEPARTMENT_OTHER)
Admission: RE | Admit: 2012-01-28 | Disposition: A | Discharge status: Home / Self Care | Payer: Self-pay | Source: Emergency Department | Attending: Emergency Medicine | Admitting: Emergency Medicine

## 2012-01-28 ENCOUNTER — Encounter (HOSPITAL_BASED_OUTPATIENT_CLINIC_OR_DEPARTMENT_OTHER): Payer: Self-pay | Admitting: Emergency Medicine

## 2012-01-28 LAB — POC URINALYSIS
BILIRUBIN, URINE: NEGATIVE
GLUCOSE,URINE: NEGATIVE
KETONE, URINE: NEGATIVE
LEUKOCYTE ESTERASE: NEGATIVE
NITRITE, URINE: NEGATIVE
PH URINE: 5 (ref 5.0–8.0)
PROTEIN, URINE: NEGATIVE
SPECIFIC GRAVITY, URINE: 1.025 (ref 1.003–1.030)
UROBILINOGEN URINE: 0.2 (ref 0.2–1.0)

## 2012-01-28 LAB — XR CHEST 2 VIEWS

## 2012-01-28 MED ORDER — ACETAMINOPHEN 500 MG PO TABS
1000.00 mg | ORAL_TABLET | Freq: Once | ORAL | Status: AC
Start: 2012-01-28 — End: 2012-01-28
  Administered 2012-01-28: 1000 mg via ORAL
  Filled 2012-01-28: qty 2

## 2012-01-28 MED ORDER — IBUPROFEN 600 MG PO TABS
600.00 mg | ORAL_TABLET | Freq: Once | ORAL | Status: AC
Start: 2012-01-28 — End: 2012-01-28
  Administered 2012-01-28: 600 mg via ORAL
  Filled 2012-01-28: qty 1

## 2012-01-28 MED ORDER — DIAZEPAM 5 MG PO TABS
5.00 mg | ORAL_TABLET | Freq: Once | ORAL | Status: AC
Start: 2012-01-28 — End: 2012-01-28
  Administered 2012-01-28: 5 mg via ORAL
  Filled 2012-01-28: qty 1

## 2012-01-28 MED ORDER — DIAZEPAM 2 MG PO TABS
2.0000 mg | ORAL_TABLET | Freq: Three times a day (TID) | ORAL | Status: AC | PRN
Start: 2012-01-28 — End: 2012-02-02

## 2012-01-28 NOTE — Discharge Instructions (Signed)
Do not drive while taking Valium.  He may use ibuprofen 600 milligrams every 8 hours as needed for pain.  He may use the Valium as prescribed for discomfort and muscle spasm.  To not drink alcohol while taking Valium.

## 2012-01-28 NOTE — ED Provider Notes (Signed)
I have reviewed the ED nursing notes and prior records. I have reviewed the patient's past medical history/problem list, allergies, social history and medication list.  I saw this patient primarily.      HPI:  This 46 year old post menopausal female patient presents with right sided shoulder and neck pain for the past 5-6 days.  The pain is not over her humerus but is in the post clavicular area.  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  Pain is worse with movement and deep inspiration.  The patient has no midline neck pain.  No fevers.  The patient had no discrete injuries.  She also was complaining of lower right leg pain.  No difficulty with urination no urinary incontinence no loss of sensation she has normal gait and walk.  ROS: Pertinent positives were reviewed as per the HPI above. All other systems were reviewed and are negative.    Past Medical History/Problem List:    Past Medical History    Irregular menstrual cycle     Pregnant state, incidental     Comment: c/s breech    Esophageal reflux     SVT (supraventricular tachycardia) 3/10    Comment: TCH     Patient Active Problem List:     LUMP OR MASS IN BREAST     ESOPHAGEAL REFLUX     GASTRITIS NEC W/O HEMORRH     Abdominal Pain, Epigastric     ANGIONEUROTIC EDEMA     Plantar Fasciitis, L     Leg Pain     Tobacco abuse, in remission     Triggering of Finger, R 3rd     Right subacromial bursitis/rotator cuff tendinopathy     Chondromalacia Patellae     Lower back pain     De Quervain's Tenosynovitis, R     CTS (Carpal Tunnel Syndrome), b/l     Family history of diabetes mellitus (DM)     Hypolipoproteinemia     Intermittent Spinal Claudication     Vitamin D deficiency     Greater Trochanteric Bursitis, b/l     DDD (Degenerative Disc Disease), Lumbar     Obesity     SVT To 240 BPM March 2010     Palpitations     Rapid Noctural Palpitations x 10 Seconds      Past Surgical History:      Past Surgical History    [facility] BREAST BIOPSY SPECIMEN       Comment s/p fibroadenoma on the L,     SEPTOPLASTY/SUBMUCOUS RESECJ W/WO CARTILAGE GRF      CESAREAN DELIVERY ONLY      Comment breech       Medications:     No current facility-administered medications on file prior to encounter.  Current Outpatient Prescriptions on File Prior to Encounter:  ibuprofen (ADVIL,MOTRIN) 600 MG tablet Take 1 tablet by mouth every 8 (eight) hours as needed for Pain. pain Disp: 20 tablet Rfl: 0   metoprolol (TOPROL-XL) 50 MG 24 hr tablet Take 1 tablet by mouth daily. Disp: 30 tablet Rfl: 11   omeprazole (PRILOSEC) 20 MG capsule Take 1 capsule by mouth daily. Disp: 30 capsule Rfl: 2   acetaminophen (TYLENOL) 325 MG tablet Take 650 mg by mouth every 6 (six) hours as needed. Disp:  Rfl:        Social History:     Smoking status: Former Smoker  0.50 Packs/Day  For 27.00 Years     Types: Cigarettes  Quit date: 06/28/2002    Smokeless tobacco:     Alcohol Use: No       Allergies:  Review of Patient's Allergies indicates:   Pollen extract-tree*    Runny Nose    Comment:HA, itchy watery eyes    Physical Exam:  BP 128/76   Pulse 84   Temp(Src) 98.7 F   Resp 16   Wt 74.844 kg   BMI 30.68 kg/m2   SpO2 99%   LMP 10/17/2006  GENERAL:  Well-appearing, no distress.  SKIN:  Warm & Dry, no rash, no bruising.  HEAD:  Atraumatic.  NECK:  Supple, no midline tenderness.   LUNGS:  Clear to auscultation bilaterally without rales, rhonchi or wheezing.   HEART:  RRR.  No murmurs, rubs, or gallops.   ABDOMEN:  Soft, flat, without distension.  Nontender to palpation.   MUSCULOSKELETAL:  No deformities. Well-perfused extremities. No cyanosis or edema.  Tenderness to the right paraspinal muscles and right upper back.  The pain is worse with palpation and movement.  There does not appear to be any pain around the shoulder girdle itself.  The patient has no focal midline neck or back tenderness to palpation.  She has good strength upper lower extremities normal sensation.  GENITOURINARY:  No CVA tenderness.   BACK:  Nontender.  NEUROLOGIC:  Normal speech.  alert & oriented x 3, CNsII-XII intact. Gait normal. Motor function 5/5 bilaterally. Sensation to touch intact throughout.   PSYCHIATRIC:  Normal affect    ED Course and Medical Decision-making:    The patient is 46 year old female with musculoskeletal neck and back pain.  Patient denies any chest pain.  She will be given Valium Tylenol and Motrin in the emergency department.  Patient had a chest x-ray which was read by me as negative.  Patient had an electrocardiogram which was bradycardic sinus rhythm without any acute changes.  Patient feeling much better after medications.    Reasons to return to the ED were reviewed in detail. The patient agrees with this plan and disposition.    Disposition:  Discharged to home    Condition on  Discharge:  Improved and Stable      Diagnosis/Diagnoses:  Back pain  (primary encounter diagnosis)  Muscle spasm        Ane Payment

## 2012-01-28 NOTE — ED Notes (Signed)
Patient Disposition    Patient education for diagnosis, medications, activity, diet and follow-up.  Patient left ED 2:45 PM.  Patient rep received written instructions.  Interpreter to provide instructions: No, pt declined.    Discharged to: Discharged to home    Pt cleared for DC by MD. Pt verbalizes understanding of DC instructions. Instructed on home care, follow-up care, med use, pain control, and what/why to return to the ED. Pt ambulatory out of ED in no acute distress. Visitor driving pt home.

## 2012-01-28 NOTE — ED Notes (Signed)
MD at bedside. 

## 2012-01-28 NOTE — ED Triage Note (Signed)
Pt presents to ED with c/o 5-6 days of right upper back pain. C/o pain radiating up to right side of neck and to right arm. Increased pain with movement of neck and of right shoulder. 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

## 2012-01-28 NOTE — ED Notes (Signed)
Pt to xray via WC

## 2012-01-31 LAB — EKG

## 2012-02-07 ENCOUNTER — Ambulatory Visit (HOSPITAL_BASED_OUTPATIENT_CLINIC_OR_DEPARTMENT_OTHER): Payer: PRIVATE HEALTH INSURANCE | Admitting: Internal Medicine

## 2012-02-27 ENCOUNTER — Ambulatory Visit (HOSPITAL_BASED_OUTPATIENT_CLINIC_OR_DEPARTMENT_OTHER): Payer: PRIVATE HEALTH INSURANCE | Admitting: Internal Medicine

## 2012-02-27 ENCOUNTER — Encounter (HOSPITAL_BASED_OUTPATIENT_CLINIC_OR_DEPARTMENT_OTHER): Payer: Self-pay | Admitting: Internal Medicine

## 2012-02-27 VITALS — BP 104/70 | HR 66 | Temp 98.0°F | Ht 61.5 in | Wt 167.0 lb

## 2012-02-27 DIAGNOSIS — IMO0001 Reserved for inherently not codable concepts without codable children: Secondary | ICD-10-CM

## 2012-02-27 DIAGNOSIS — R3129 Other microscopic hematuria: Secondary | ICD-10-CM

## 2012-02-27 DIAGNOSIS — K219 Gastro-esophageal reflux disease without esophagitis: Secondary | ICD-10-CM

## 2012-02-27 DIAGNOSIS — M25511 Pain in right shoulder: Secondary | ICD-10-CM

## 2012-02-27 DIAGNOSIS — Z0000 Encounter for general adult medical examination without abnormal findings: Secondary | ICD-10-CM

## 2012-02-27 DIAGNOSIS — Z23 Encounter for immunization: Secondary | ICD-10-CM

## 2012-02-27 NOTE — Progress Notes (Signed)
Pt feels safe at home

## 2012-02-27 NOTE — Progress Notes (Signed)
HPI Comments: Deborah [name] is a 46 year old female    Review of Patient's Allergies indicates:   Pollen extract-tree*    Runny Nose    Comment:HA, itchy watery eyes    Current Outpatient Prescriptions:  ibuprofen (ADVIL,MOTRIN) 600 MG tablet, Take 1 tablet by mouth every 8 (eight) hours as needed for Pain. pain, Disp: 20 tablet, Rfl: 0  metoprolol (TOPROL-XL) 50 MG 24 hr tablet, Take 1 tablet by mouth daily., Disp: 30 tablet, Rfl: 11  (DISCONTINUED) ibuprofen (ADVIL,MOTRIN) 600 MG tablet, Take 1 tablet by mouth every 8 (eight) hours as needed for Pain. pain, Disp: 20 tablet, Rfl: 0  omeprazole (PRILOSEC) 20 MG capsule, Take 1 capsule by mouth daily., Disp: 30 capsule, Rfl: 2  (DISCONTINUED) acetaminophen (TYLENOL) 325 MG tablet, Take 650 mg by mouth every 6 (six) hours as needed., Disp: , Rfl:   No current facility-administered medications for this visit.    Patient Active Problem List:     LUMP OR MASS IN BREAST     ESOPHAGEAL REFLUX     GASTRITIS NEC W/O HEMORRH     Abdominal Pain, Epigastric     ANGIONEUROTIC EDEMA     Plantar Fasciitis, L     Leg Pain     Tobacco abuse, in remission     Triggering of Finger, R 3rd     Right subacromial bursitis/rotator cuff tendinopathy     Chondromalacia Patellae     Lower back pain     De Quervain's Tenosynovitis, R     CTS (Carpal Tunnel Syndrome), b/l     Family history of diabetes mellitus (DM)     Hypolipoproteinemia     Intermittent Spinal Claudication     Vitamin D deficiency     Greater Trochanteric Bursitis, b/l     DDD (Degenerative Disc Disease), Lumbar     Obesity     SVT To 240 BPM March 2010     Palpitations     Rapid Noctural Palpitations x 10 Seconds    Social History    Marital Status: Married             Spouse Name:                       Years of Education:                 Number of children: 1             Occupational History  Occupation          Technical sales engineer                               Social History  Main Topics    Smoking Status: Former Smoker                   Packs/Day: 0.50  Years: 27        Types: Cigarettes      Quit date: 06/28/2002    Smokeless Status: Not on file                       Alcohol Use: No              Drug Use: No  Sexual Activity: Not Currently     Partners with: Female       Comment: no STI hx; HIV neg 2002    Other Topics            Concern  Stress Concern          Yes    Comment:rev  Weight Concern          Yes    Comment:rev  Special Diet            Yes    Comment:rev  Exercise                No  Seat Belt               Yes    Comment:rev  Self-Exams              Yes    Comment:rev    Social History Narrative    Deborah [name], [place]. To [place] 2000.    Lives with her son and her parents. Divorced from husband.    Denies DV. No guns in home.      Pt here for CPE with c/o reflux and shoulder pain and hematuria    Notes chronic right shoulder pain, now improved s/p chiropractic treatment. However, notes that she has continued to take ibuprofen for pain relatively frequently with frequent heartburn, bitter taste in throat, and occasional epigastric pain. Denies any diarrhea or blood in stool.    Notes worsening chronic sweats x 3 months, associated with urinary urge.    Review of Systems   Constitutional: Positive for diaphoresis. Negative for fever, chills and weight loss.   HENT: Negative for congestion and sore throat.    Eyes: Negative for blurred vision.   Respiratory: Negative for cough and shortness of breath.    Cardiovascular: Negative for chest pain and leg swelling.   Gastrointestinal: Positive for heartburn and abdominal pain. Negative for nausea, vomiting, diarrhea, constipation and blood in stool.        See HPI   Genitourinary: Negative for dysuria.   Musculoskeletal: Positive for back pain and joint pain.   Skin: Negative for itching and rash.   Neurological: Negative for tingling and headaches.   Psychiatric/Behavioral: The patient does not have insomnia.         See HPI;  PHQ 9 - 2 reviewed; notes sleep disturbance due to sweats; also notes sleeping less recently     Physical Exam   Vitals reviewed.  Constitutional: She appears well-developed and well-nourished. No distress.   HENT:   Head: Normocephalic and atraumatic.   Mouth/Throat: Oropharynx is clear and moist. No oropharyngeal exudate.   Eyes: Right eye exhibits no discharge. Left eye exhibits no discharge. No scleral icterus.   Neck: Neck supple. No thyromegaly present.   Cardiovascular: Normal rate, regular rhythm and normal heart sounds.  Exam reveals no gallop and no friction rub.    No murmur heard.  Pulmonary/Chest: Effort normal and breath sounds normal. No respiratory distress. She has no wheezes. She has no rales. She exhibits no tenderness.   Abdominal: Soft. Bowel sounds are normal. She exhibits no distension and no mass. There is no tenderness. There is no rebound and no guarding.   Musculoskeletal: She exhibits no edema and no tenderness.   Lymphadenopathy:     She has no cervical adenopathy.   Neurological: She is alert. She has normal reflexes. She displays normal reflexes. No cranial nerve deficit. Coordination normal.  Skin: Skin is warm and dry. No rash noted. She is not diaphoretic. No erythema.   Psychiatric: Her behavior is normal.          02/27/12  1816   BP: 104/70   Pulse: 66   Temp: 98 F (36.7 C)   TempSrc: Temporal   Height: 5' 1.5" (1.562 m)   Weight: 167 lb (75.751 kg)   SpO2: 98%     (V70.0) Physical exam, routine  (primary encounter diagnosis)  Comment: healthy, obese female  Plan: counseled re need for wt loss; next CPE >02/26/13    (V06.5) Need for prophylactic vaccination with tetanus-diphtheria (TD)  Comment: >10 yrs  Plan: TETANUS & DIPHTHERIA TOXOIDS ADSORBED 7/>YR IM,        IMMUNIZATION ADMIN EACH ADD, RN        Today with RN    (V04.81) Need for prophylactic vaccination and inoculation against influenza  Comment: none this year  Plan: IMMUNIZATION ADMIN SINGLE, RN, FLU VACC SPLIT          VIRUS 3 & > (PUBLIC)        Today with RN    (719.41) Right shoulder pain  Comment: chronic, but asx today  Plan: ibuprofen (ADVIL,MOTRIN) 600 MG tablet        Counseled pt to d/c all NSAID use    (530.81) Esophageal reflux  Comment: chronic, but recently exacerbated by NSAID use - concern for gastritis  Plan: REFERRAL TO GASTROENTEROLOGY ( INT)        For evaluation and possible EGD; f/u c PCP prn prior    (599.72) Hematuria, microscopic  Comment: prev noted in ED  Plan: URINALYSIS, CANCELED: URINALYSIS        rechecking    (780.8) Sweating  Comment: with micturition, chronic, worsening - highly concerning for catecholamine-secreting bladder paraganglioma, though without syncope, but with hematuria on recent UA vs. Pheochromocytoma, though with normal BP, but known SVT vs. Dopamine-secreting paraganglioma, though sxs not as suggestive vs. Hypogonadism, with pt noting menopause occurring 5 years ago vs. Thyroid malignancy or toxicosis vs. Carcinoid, but without diarrhea vs. Insulinoma vs. Other etiologies  Plan: COLLECTION VENOUS BLOOD VENIPUNCTURE, THYROID         SCREEN TSH REFLEX FT4, CANCELED: COLLECTION         VENOUS BLOOD VENIPUNCTURE, CANCELED: THYROID         SCREEN TSH REFLEX FT4        Consider 24 hour urine metanephrines; if testing unrevealing, continue evaluation as above    I have reviewed the past medical, surgical, social and family history and updated these sections of EpicCare as relevant. All interim labs, test results, and consult notes were reviewed and discussed with Jerilynn Mages. Medications were reconciled during this visit and a current medication list was given to the patient at the end of the visit.    F/u c PCP prn and as directed

## 2012-02-27 NOTE — Progress Notes (Signed)
VIS given prior to administration and reviewed with the patient and or legal guardian. Patient understands the disease and the vaccine.   Per orders of MD, injection given by Zabdiel Dripps, RN. Pt  Instructed to report any adverse reaction to me. Discussed analgesia and comfort measures. Teaching done regarding immunization schedule and importance of immunizations in preventing disease.    Influenza Vaccine Procedure    1. Has the patient received the information for the influenza vaccine? Yes    2. Does the patient have any of the following contraindications?  Allergy to eggs? No  Allergic reaction to previous influenza vaccines? No  Any other problems to previous influenza vaccines? No  Paralyzed by Guillain-Barre syndrome?  No  Currently pregnant? No  Current moderate or severe illness? No  Allergy to contact lens solution? No    3. The vaccine has been administered in the usual fashion and the patient/guardian was instructed to wait 20 minutes before leaving the building in the event of an allergic reaction:     Immunization information and current VIS for flu vaccine(s) reviewed; verbal consent given by patient/guardian.

## 2012-03-05 ENCOUNTER — Ambulatory Visit (HOSPITAL_BASED_OUTPATIENT_CLINIC_OR_DEPARTMENT_OTHER): Payer: Self-pay | Admitting: Internal Medicine

## 2012-03-05 ENCOUNTER — Ambulatory Visit (HOSPITAL_BASED_OUTPATIENT_CLINIC_OR_DEPARTMENT_OTHER): Payer: PRIVATE HEALTH INSURANCE

## 2012-03-05 DIAGNOSIS — IMO0001 Reserved for inherently not codable concepts without codable children: Secondary | ICD-10-CM

## 2012-03-05 DIAGNOSIS — R3129 Other microscopic hematuria: Secondary | ICD-10-CM

## 2012-03-05 LAB — URINALYSIS
BILIRUBIN, URINE: NEGATIVE
CASTS: NONE SEEN PER LPF
CRYSTALS: NONE SEEN
GLUCOSE, URINE: NEGATIVE MG/DL
KETONE, URINE: NEGATIVE MG/DL
NITRITE, URINE: NEGATIVE
PH URINE: 5.5 (ref 5.0–8.0)
PROTEIN, URINE: NEGATIVE MG/DL
SPECIFIC GRAVITY URINE: 1.02 (ref 1.003–1.035)

## 2012-03-05 LAB — THYROID SCREEN TSH REFLEX FT4: THYROID SCREEN TSH REFLEX FT4: 2.73 u[IU]/mL (ref 0.34–5.60)

## 2012-03-06 LAB — MA SCREENING MAMMO BILATERAL WITH CAD

## 2012-03-06 NOTE — Addendum Note (Signed)
Addended byGwenith Spitz on: 03/06/2012 01:08 AM     Modules accepted: Orders

## 2012-03-08 ENCOUNTER — Telehealth (HOSPITAL_BASED_OUTPATIENT_CLINIC_OR_DEPARTMENT_OTHER): Payer: Self-pay

## 2012-03-08 ENCOUNTER — Other Ambulatory Visit (HOSPITAL_BASED_OUTPATIENT_CLINIC_OR_DEPARTMENT_OTHER): Payer: Self-pay

## 2012-03-08 DIAGNOSIS — R300 Dysuria: Secondary | ICD-10-CM

## 2012-03-08 NOTE — Progress Notes (Addendum)
Covering for Dr Kristen Loader pt to notify her that Dr Maggie Schwalbe had wanted to do further testing on her urine and there was not enough of a sample  Also to see if she is having any new or progressive symptoms/  Denies dysuria, reports urinating 'a lot in the morning' and normally during the day. Continues to note 'heat sensation throughout body' when she urinates at night.   No hematuria no fevers no n/v/back pain or hematuria.   Future order for UA and C&S placed.   Asked pt to go to [facility] tomorrow morning to leave clean urine sample. Communicated this plan with  Provider for [facility].

## 2012-03-08 NOTE — Progress Notes (Signed)
Marylu Lund, from [facility] lab called. Unable to add C&S or urine ,did not have enough specimen.  CC to Dr. Maggie Schwalbe.

## 2012-03-08 NOTE — Progress Notes (Signed)
(  788.1) Dysuria  (primary encounter diagnosis)  Comment: no progression of sx's, no u-cx added to last UA; to have repeat sample with culture  Plan: URINALYSIS, URINE CULTURE/COLONY COUNT          Vincente Asbridge MD, 03/08/2012, 8:05 PM

## 2012-03-11 LAB — METANEPHRINES FRACTION PLASMA
METANEPHRINE,PL: 29 pg/mL (ref 0–62)
NORMETANEPHRINE, PL: 72 pg/mL (ref 0–145)

## 2012-03-12 ENCOUNTER — Ambulatory Visit (HOSPITAL_BASED_OUTPATIENT_CLINIC_OR_DEPARTMENT_OTHER): Payer: PRIVATE HEALTH INSURANCE

## 2012-03-12 DIAGNOSIS — R300 Dysuria: Secondary | ICD-10-CM

## 2012-03-12 LAB — URINALYSIS
BILIRUBIN, URINE: NEGATIVE
CASTS: NONE SEEN PER LPF
CRYSTALS: NONE SEEN
GLUCOSE, URINE: NEGATIVE MG/DL
KETONE, URINE: NEGATIVE MG/DL
LEUKOCYTE ESTERASE: NEGATIVE
NITRITE, URINE: NEGATIVE
PH URINE: 5.5 (ref 5.0–8.0)
PROTEIN, URINE: NEGATIVE MG/DL
SPECIFIC GRAVITY URINE: 1.025 (ref 1.003–1.035)

## 2012-03-12 NOTE — Progress Notes (Signed)
Sent urine to lab

## 2012-03-12 NOTE — Progress Notes (Signed)
Pt did not get urine cx or u/a done at [facility] as discussed with Nurse on 03/08/12.  Here today to do.  Will do today.  Orders extended to be done today.

## 2012-03-13 LAB — URINE CULTURE/COLONY COUNT

## 2012-03-14 ENCOUNTER — Encounter (HOSPITAL_BASED_OUTPATIENT_CLINIC_OR_DEPARTMENT_OTHER): Payer: Self-pay | Admitting: Internal Medicine

## 2012-03-14 ENCOUNTER — Telehealth (HOSPITAL_BASED_OUTPATIENT_CLINIC_OR_DEPARTMENT_OTHER): Payer: Self-pay | Admitting: Internal Medicine

## 2012-03-14 DIAGNOSIS — R3129 Other microscopic hematuria: Secondary | ICD-10-CM

## 2012-03-14 NOTE — Progress Notes (Addendum)
Please discuss with pt  Urine studies without infection  There was a small amount of blood in the urine which often goes away on its own. However she does need a repeat urine test in the next 2-4 weeks to make sure that the blood is gone.   Order entered.

## 2012-03-14 NOTE — Progress Notes (Signed)
Call to pt.  LM on VM to return RN call when able.

## 2012-03-15 NOTE — Progress Notes (Signed)
Left a message on an identified voice mail  To call clinic if still with questions or concerns  Await a call back

## 2012-03-15 NOTE — Progress Notes (Signed)
Spoke with patient using telephone interpreter, Deborah [name]  Relayed results and plan  Verbalized back and agrees with plan

## 2012-03-15 NOTE — Telephone Encounter (Signed)
Message copied by Johnette Abraham on Fri Mar 15, 2012  2:31 PM  ------       Message from: Murvin Donning       Created: Fri Mar 15, 2012  1:01 PM       Regarding: Returning phonecall                       Deborah [name] [id number], 46 year old, female, Telephone Information:        Home Phone      [phone]       Work Phone      Not on file.       Mobile          [phone]                     Patient's Preferred Pharmacy:               [facility] ([facility])       Phone: [phone] Fax: [phone]                     CONFIRMED TODAY: Lovett Sox NUMBER: [phone]       Best time to call back:anytimeCell phone:        Other phone:              Available times:              Patient's language of care: [place]              Patient does not need an interpreter.              Patient's PCP: Judieth Keens, MD              Person calling on behalf of patient: Patient (self)              Calls today Returning phonecall              Thanks                              ------

## 2012-03-15 NOTE — Progress Notes (Signed)
Labs drawn and urine collected    LDG

## 2012-03-18 ENCOUNTER — Telehealth (HOSPITAL_BASED_OUTPATIENT_CLINIC_OR_DEPARTMENT_OTHER): Payer: Self-pay | Admitting: Internal Medicine

## 2012-03-18 DIAGNOSIS — L749 Eccrine sweat disorder, unspecified: Secondary | ICD-10-CM

## 2012-03-18 DIAGNOSIS — R319 Hematuria, unspecified: Secondary | ICD-10-CM

## 2012-03-18 NOTE — Progress Notes (Signed)
Reviewed recent labs showing nor/metanephrines normal and UA still + for hematuria (small).    Referring to urology for further evaluation of hematuria/sweats, as well as f/u with me regarding both.    Copying to referral coordinator and FD staff to schedule appts above.

## 2012-04-09 ENCOUNTER — Ambulatory Visit (HOSPITAL_BASED_OUTPATIENT_CLINIC_OR_DEPARTMENT_OTHER): Payer: PRIVATE HEALTH INSURANCE | Admitting: Internal Medicine

## 2012-04-09 ENCOUNTER — Encounter (HOSPITAL_BASED_OUTPATIENT_CLINIC_OR_DEPARTMENT_OTHER): Payer: Self-pay | Admitting: Internal Medicine

## 2012-04-09 VITALS — BP 102/70 | HR 64 | Temp 98.0°F | Wt 170.0 lb

## 2012-04-09 DIAGNOSIS — E559 Vitamin D deficiency, unspecified: Secondary | ICD-10-CM

## 2012-04-09 DIAGNOSIS — R319 Hematuria, unspecified: Secondary | ICD-10-CM

## 2012-04-09 DIAGNOSIS — IMO0001 Reserved for inherently not codable concepts without codable children: Secondary | ICD-10-CM

## 2012-04-09 MED ORDER — VITAMIN D 50 MCG (2000 UT) PO TABS
2000.0000 [IU] | ORAL_TABLET | Freq: Every day | ORAL | Status: DC
Start: 2012-04-09 — End: 2013-02-25

## 2012-04-09 NOTE — Progress Notes (Signed)
Pt feels safe at home

## 2012-04-09 NOTE — Progress Notes (Signed)
HPI Comments: Deborah [name] is a 46 year old female    Review of Patient's Allergies indicates:   Ibuprofen               Other (See Comments)    Comment:Epigastric pain, throat swelling?   Pollen extract-tree*    Runny Nose    Comment:HA, itchy watery eyes    Current Outpatient Prescriptions:  cholecalciferol (VITAMIN D3) 2000 UNIT tablet, Take 1 tablet by mouth daily., Disp: 30 tablet, Rfl: 11  metoprolol (TOPROL-XL) 50 MG 24 hr tablet, Take 1 tablet by mouth daily., Disp: 30 tablet, Rfl: 11  No current facility-administered medications for this visit.    Patient Active Problem List:     LUMP OR MASS IN BREAST     ESOPHAGEAL REFLUX     GASTRITIS NEC W/O HEMORRH     Abdominal Pain, Epigastric     ANGIONEUROTIC EDEMA     Plantar Fasciitis, L     Leg Pain     Tobacco abuse, in remission     Triggering of Finger, R 3rd     Right subacromial bursitis/rotator cuff tendinopathy     Chondromalacia Patellae     Lower back pain     De Quervain's Tenosynovitis, R     CTS (Carpal Tunnel Syndrome), b/l     Family history of diabetes mellitus (DM)     Hypolipoproteinemia     Intermittent Spinal Claudication     Vitamin D deficiency     Greater Trochanteric Bursitis, b/l     DDD (Degenerative Disc Disease), Lumbar     Obesity     SVT To 240 BPM March 2010     Palpitations     Rapid Noctural Palpitations x 10 Seconds     Sweating     Microscopic hematuria    Social History    Marital Status: Married             Spouse Name:                       Years of Education:                 Number of children: 1             Occupational History  Occupation          Technical sales engineer                               Social History Main Topics    Smoking Status: Former Smoker                   Packs/Day: 0.50  Years: 27        Types: Cigarettes      Quit date: 06/28/2002    Smokeless Status: Not on file                       Alcohol Use: No              Drug Use: No              Sexual Activity:  Not Currently     Partners with: Female       Comment: no STI hx;  HIV neg 2002    Other Topics            Concern  Stress Concern          Yes    Comment:rev  Weight Concern          Yes    Comment:rev  Special Diet            Yes    Comment:rev  Exercise                No  Seat Belt               Yes    Comment:rev  Self-Exams              Yes    Comment:rev    Social History Narrative    [place], [place]. To [place] 2000.    Lives with her son and her parents. Divorced from husband.    Denies DV. No guns in home.      Pt here for f/u re sweats, abdominal pain and hematuria    Notes continued sweats/hot flushes with urinary urge only. Denies any dizziness, tachycardia or palpitations or pre/syncope. Notes continued recent epigastric pain with ibuprofen use, as well as sense of swelling.throat closing at her throat with ibuprofen use. Notes that this is also associated with Tylenol use. Denies any N/V/D/C or blood in stool. Denies any gross hematuria. Notes feeling of increased saliva production.       Physical Exam   Vitals reviewed.  Constitutional: She appears well-developed and well-nourished. No distress.   HENT:   Head: Normocephalic and atraumatic.   Pulmonary/Chest: Effort normal.   Neurological: She is alert. Coordination normal.   Skin: She is not diaphoretic.   Psychiatric: Her behavior is normal.      04/09/12  1334   BP: 102/70   Pulse: 64   Temp: 98 F (36.7 C)   TempSrc: Temporal   Weight: 170 lb (77.111 kg)   SpO2: 100%       (780.8) Sweating  (primary encounter diagnosis)  Comment: with flushing with micturition, chronic - highly concerning for catecholamine-secreting bladder paraganglioma, though without syncope and urine catecholamine evaluation neg vs. Pheochromocytoma, though with normal BP and urine catecholamine/metanephrine evaluation neg , but known SVT vs. Dopamine-secreting paraganglioma, though sxs not as suggestive vs. Hypogonadism, with pt noting menopause occurring 5 years ago vs. Thyroid  malignancy or toxicosis less likely vs. Carcinoid, but without diarrhea vs. Insulinoma vs. Other etiologies  Plan: checking 5-HYDROXYINDOLACETIC ACID, 24 HR URINE in advance of urology evaluation            (268.9) Vitamin d deficiency  Comment: prev noted  Plan: cholecalciferol (VITAMIN D3) 2000 UNIT tablet        To restart supplement    (599.70) Hematuria  Comment: unclear etiology, persistent on UA - stones vs. Nephritis caused by NSAID use  Plan: f/u c urology as scheduled    I have reviewed the past medical, surgical, social and family history and updated these sections of EpicCare as relevant. All interim labs, test results, and consult notes were reviewed and discussed with Jerilynn Mages. Medications were reconciled during this visit and a current medication list was given to the patient at the end of the visit.    I have spent 25 minutes in face to face time with this patient/patient proxy of which > 50% was in counseling or coordination of care regarding above issues/Dx.

## 2012-04-11 ENCOUNTER — Ambulatory Visit (HOSPITAL_BASED_OUTPATIENT_CLINIC_OR_DEPARTMENT_OTHER): Payer: PRIVATE HEALTH INSURANCE

## 2012-04-11 DIAGNOSIS — R3129 Other microscopic hematuria: Secondary | ICD-10-CM

## 2012-04-11 DIAGNOSIS — IMO0001 Reserved for inherently not codable concepts without codable children: Secondary | ICD-10-CM

## 2012-04-11 LAB — URINALYSIS
BILIRUBIN, URINE: NEGATIVE
CASTS: NONE SEEN PER LPF
CRYSTALS: NONE SEEN
GLUCOSE, URINE: NEGATIVE MG/DL
KETONE, URINE: NEGATIVE MG/DL
LEUKOCYTE ESTERASE: NEGATIVE
NITRITE, URINE: NEGATIVE
PH URINE: 5.5 (ref 5.0–8.0)
PROTEIN, URINE: NEGATIVE MG/DL
SPECIFIC GRAVITY URINE: 1.01 (ref 1.003–1.035)

## 2012-04-11 LAB — HOLD RED TOP TUBE

## 2012-04-11 NOTE — Progress Notes (Signed)
.  Received from patient urine sent to main lab for u\a and 24 hour urine

## 2012-04-17 LAB — 5-HYDROXYINDOLACETIC ACID 24U
5-HYDROXYINDOLACETIC ACID, 24U: 3.7 mg/24 hr (ref 0.0–14.9)
5-HYDROXYINDOLACETIC ACID, URI: 1.9 mg/L
TOTAL VOLUME 24 HOUR URINE: 1925

## 2012-04-23 ENCOUNTER — Encounter (HOSPITAL_BASED_OUTPATIENT_CLINIC_OR_DEPARTMENT_OTHER): Payer: Self-pay | Admitting: Internal Medicine

## 2012-04-23 ENCOUNTER — Ambulatory Visit (HOSPITAL_BASED_OUTPATIENT_CLINIC_OR_DEPARTMENT_OTHER): Payer: PRIVATE HEALTH INSURANCE | Admitting: Internal Medicine

## 2012-04-23 VITALS — BP 110/74 | HR 66 | Ht 61.5 in | Wt 170.0 lb

## 2012-04-23 DIAGNOSIS — I471 Supraventricular tachycardia, unspecified: Secondary | ICD-10-CM

## 2012-04-23 DIAGNOSIS — R002 Palpitations: Secondary | ICD-10-CM

## 2012-04-23 NOTE — Progress Notes (Signed)
Patient feels safe at home.

## 2012-04-23 NOTE — Progress Notes (Signed)
Date of Service: 04/23/2012    April 23, 2012    Gwenith Spitz, MD  [facility]  [address]  [place], [place] [address]    RE:  Deborah [name].    Dear Cleone Slim:    I saw Deborah [name] in the office on 14 January in followup of her prior visit with me of 6 months ago regarding SVT to 240 beats per minute 3 years ago.  She also has GERD.  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  She is on metoprolol 50 and omeprazole 20 with no drug allergies.  Family history shows a maternal uncle and maternal grandmother with heart disease.  She uses no substances and lives in the [place] for 14 years.  She lives with her parents and her son.    When I saw her last, she was awakening with 3-10 seconds of rapid palpitations with a little chest constriction.  A Holter 1 year ago showed 1 APC with no sequentials and no ventricular ectopy whatever.  An echo a year ago was perfectly normal.  Her exam was unremarkable, and I did not make any interventions.    She is still having every other week an event of awakening at night because of rapid palpitations lasting only a few seconds.  There is sometimes recurrence.  It is not adversely affecting her sleep or her life.  _____ This rarely occurs in the day and does not adversely affect her work.    She fills out our 10-system ROS form and describes these palpitations and some hematuria, and all other systems are reviewed and are negative.    Today, she stands 5 feet 2 inches and weighs 170 pounds.  The heart rate is 66 and regular and the pressure 110/74 with 100 sat on room air.  Ocular muscles are intact, and the tongue is midline with symmetric face and good dentition.  No palpable cervical nodes or thyroid and no JVD.  The chest is clear.  The heart is regular with a 1/6 systolic murmur.  The abdomen is soft without palpable organ, mass, or tenderness.  There is no rash, clubbing, cyanosis, or edema.  She is fully oriented and has appropriate  affect.    Her last EKG was just 3 months ago and revealed, to my personal review now, a perfectly normal tracing.  Recall that her echo was normal 1 year ago.    Her bloods show a normal CBC a year and a half ago in August of 2011.  The TSH was normal a year and a half ago.  The BMP from a year and a half ago was perfect.    My impression is that of some palpitations, which do not last long and are infrequent and are not disrupting her lifestyle.  She has an anatomically normal heart but does have some documented SVT in the past that was quite fast, but it seems to be in abeyance.  I do not think it is reasonable to change her regimen to deal with something that is not bothering her and occurs only twice a month for 10 seconds apiece.  I have reassured her copiously and am very pleased with her status.  I would like to see her again in 6 months.  I will report back to you at that time.  Thank you for the privilege of participating in her care.    Sincerely,    ___________________________  Reviewed and Electronically Signed By: Allegra Grana MD  Sig Date: 04/24/2012  Sig Time: 14:20:22  Dictated By: Allegra Grana MD  Dict Date: 04/23/2012 Dict Time: 06 05 PM    Dictation Date and Time:04/23/2012 18:05:30  Transcription Date and Time:04/23/2012 18:59:13  eScription Dictation id: [id number] Confirmation # :[id number]      cc: Gwenith Spitz MD    DICTATED BY: Allegra Grana MDTHOMAS Karrin Eisenmenger MDD:04/23/2012 18:05:30 T:04/23/2012 18:59:13 CN Job#: [id number]

## 2012-05-02 ENCOUNTER — Ambulatory Visit (HOSPITAL_BASED_OUTPATIENT_CLINIC_OR_DEPARTMENT_OTHER): Payer: PRIVATE HEALTH INSURANCE | Admitting: Gastroenterology

## 2012-05-02 VITALS — BP 108/70 | HR 78 | Wt 170.0 lb

## 2012-05-02 DIAGNOSIS — K219 Gastro-esophageal reflux disease without esophagitis: Secondary | ICD-10-CM

## 2012-05-02 DIAGNOSIS — R1310 Dysphagia, unspecified: Secondary | ICD-10-CM

## 2012-05-02 MED ORDER — OMEPRAZOLE MAGNESIUM 20 MG PO TBEC
20.00 mg | DELAYED_RELEASE_TABLET | Freq: Two times a day (BID) | ORAL | Status: AC
Start: 2012-05-02 — End: 2012-06-01

## 2012-05-02 MED ORDER — RANITIDINE HCL 300 MG PO CAPS
300.00 mg | ORAL_CAPSULE | Freq: Every evening | ORAL | Status: AC
Start: 2012-05-02 — End: 2012-06-01

## 2012-05-02 NOTE — Progress Notes (Addendum)
Primary note dictated through E-Scription.  See "Notes" tab in Chart Review for transcribed note; final note also appears within encounter.        History of the Present Illness:  Please see the associated dictated note above    Problem List:  Patient Active Problem List:     LUMP OR MASS IN BREAST     ESOPHAGEAL REFLUX     GASTRITIS NEC W/O HEMORRH     Abdominal Pain, Epigastric     ANGIONEUROTIC EDEMA     Plantar Fasciitis, L     Leg Pain     Tobacco abuse, in remission     Triggering of Finger, R 3rd     Right subacromial bursitis/rotator cuff tendinopathy     Chondromalacia Patellae     Lower back pain     De Quervain's Tenosynovitis, R     CTS (Carpal Tunnel Syndrome), b/l     Family history of diabetes mellitus (DM)     Hypolipoproteinemia     Intermittent Spinal Claudication     Vitamin D deficiency     Greater Trochanteric Bursitis, b/l     DDD (Degenerative Disc Disease), Lumbar     Obesity     SVT To 240 BPM March 2010     Palpitations     Rapid Noctural Palpitations x 10 Seconds     Sweating     Microscopic hematuria    Past Surgical History:      Past Surgical History    [place] BREAST BIOPSY SPECIMEN      Comment s/p fibroadenoma on the L,     SEPTOPLASTY/SUBMUCOUS RESECJ W/WO CARTILAGE GRF      CESAREAN DELIVERY ONLY      Comment breech     Social History:    Smoking Status: Former Smoker                   Packs/Day: 0.50  Years: 27        Types: Cigarettes      Quit date: 06/28/2002    Alcohol Use: No              Family History:    Family History    Hypertension Mother     Hypertension Father     Diabetes Mother     Diabetes Maternal Uncle     Diabetes Maternal Uncle     Cancer - Other FamHxNeg     Cancer - Breast FamHxNeg     Cancer - Colon FamHxNeg     Cancer - Lung FamHxNeg     Cancer - Ovarian FamHxNeg     Heart Maternal Grandmother     Heart Maternal Uncle     Heart Maternal Uncle      Allergies:  Review of Patient's Allergies indicates:   Ibuprofen               Other (See Comments)     Comment:Epigastric pain, throat swelling?   Pollen extract-tree*    Runny Nose    Comment:HA, itchy watery eyes  Current Medications:    Current Outpatient Prescriptions:  omeprazole (PRILOSEC) 20 MG capsule Take 20 mg by mouth daily. Pt states OTC and not sure of dose. Disp:  Rfl:    cholecalciferol (VITAMIN D3) 2000 UNIT tablet Take 1 tablet by mouth daily. Disp: 30 tablet Rfl: 11   metoprolol (TOPROL-XL) 50 MG 24 hr tablet Take 1 tablet by mouth daily. Disp: 30 tablet Rfl: 11  No current facility-administered medications for this visit.  Review of Systems:  All systems were otherwise negative except for the GI and other systems documented in the History of the Present Illness.  Physical Exam:  The patient is sitting in the examining table and appears to be in no apparent distress.  BP 108/70   Pulse 78   Wt 170 lb (77.111 kg)   BMI 31.6 kg/m2   SpO2 99%   LMP 10/17/2006  HEENT: Normocephalic atraumatic  Oropharynx: Clear with no erythema or exudates  Neck: Supple no palpable lymphadenopathy in the neck or the axilla  Lungs: Clear to ausculation bilaterally  Heart: Regular rate and rhythm,  no murmurs rubs or gallops  ZOX:WRUE non tender with positive bowel sounds.  Extremities: No swelling or edema  Skin: No rashes or lesions  Neuro: Alert and oriented to person , place and time    A/P:  Please  see the associated dictated plan above.  Esophageal reflux  (primary encounter diagnosis)  Dysphagia  Active GI  Problems Addressed During This Visit (Visit Diagnoses):      Patient understands and agrees with the plan

## 2012-05-02 NOTE — Addendum Note (Signed)
Addended by: Farley Ly on: 05/02/2012 02:29 PM     Modules accepted: Orders

## 2012-05-02 NOTE — Progress Notes (Signed)
[place] interpreter requested for this visit.      Denies pain.      Pt. states she started OTC omeprazole    Complains of" heartburn"

## 2012-05-06 ENCOUNTER — Ambulatory Visit (HOSPITAL_BASED_OUTPATIENT_CLINIC_OR_DEPARTMENT_OTHER): Payer: PRIVATE HEALTH INSURANCE | Admitting: Urology

## 2012-05-06 VITALS — BP 129/82 | HR 68 | Temp 98.6°F

## 2012-05-06 DIAGNOSIS — R3129 Other microscopic hematuria: Secondary | ICD-10-CM

## 2012-05-06 LAB — URINE DIP (POINT OF CARE)
BILIRUBIN, URINE: NEGATIVE
GLUCOSE, URINE: NEGATIVE mg/dl
KETONE, URINE: NEGATIVE mg/dl
LEUKOCYTE ESTERASE: NEGATIVE
NITRITE, URINE: NEGATIVE
PH URINE: 5.5 (ref 5.0–8.0)
PROTEIN, URINE: NEGATIVE mg/dl (ref 0–15)
SPECIFIC GRAVITY URINE: 1.025 (ref 1.003–1.030)
UROBILINOGEN URINE: 0.2 mg/dl (ref 0.2–1.0)

## 2012-05-06 LAB — MEAS POST-VOIDING RESIDUAL URINE&/BLADDER CAP: Post Void Residual: 0 (ref 0–0)

## 2012-05-06 NOTE — Progress Notes (Signed)
I have been asked by Judieth Keens, MD to assess and treat this patient for Patient presents with:    Hematuria    Please see UDS questionnaire for full HPI    One UA with 3-4 RBC/HPF    No fevers, chills, or sweats  No nauseau, vomiting, or diarrhea  Patient denies any weight loss, chronic fatigue, or malaise.  Patient denies night sweats  Patient denies any flank pain, back pain or recent skeletal fractures.  Patient denies any chest pains, chest palpitations, or chest tightness.  Patient denies any shortness of breath or new unexplained cough.  Patient denies abdominal pain and has a good appetite, no nausea/vomiting, and normal bowel function.  Patient denies any NEUROLOGIC symptoms: no recent headaches or change in vision. No unexplained dizziness, muscle weakness, or paresthesia.  Patient denies and new rash or skin lesions.  Further review of systems please see the electronic chart  See electronic chart for Social History  See electronic chart for Family History    PHYSICAL EXAM:    CONSTITUTIONAL: Patient was well developed and well nourished in no apparent   distress.   NEUROLOGY: Alert and oriented.  PSYCH: Good spirits  HEAD: Atraumatic and normocephalic.  THROAT: Without masses and trachea midline.  CVS: No JVD  EYES: Without nystagmus and cataracts.  LUNGS: No respiratory distress.  ABDOMEN: Soft, nondistended  EXTREMITIES/MUSCULO: Lower extremities without edema and well perfused. No abnormal gait  SKIN: No visible skin lesions.    AP  The patient has >3-4 RBC/ Hpf or gross hematuria and has not had a hematuria workup within the last 2-3 years.  Will order cytology and imaging study today and follow up with cystoscopy.

## 2012-05-08 LAB — CYTOLOGY SPECIMEN

## 2012-05-08 NOTE — Progress Notes (Signed)
Date of Service: 05/02/2012    May 02, 2012    Gwenith Spitz, MD  [facility]  [address]  [place], [place]  [address]    RE:  Deborah [name]    Dear Dr. Maggie Schwalbe:    It is with great pleasure that I am seeing our mutual patient in clinical followup today in the gastroenterology clinics at the [facility].  As you know, this is a 47 year old female who comes in today with a chief complaint of recurrent episodes of abdominal discomfort and pain in the midepigastric area.  She described the pain as a burning sensation associated with eating, worse at night, and worse with recumbency.  The burning starts in the midepigastric area and radiates to the neck.  She denies any weight loss, any melena, any odynophagia, any dysphagia.  She also denies any specific shortness of breath, cough, or any history of esophageal or gastric cancer.  Taken together, the patient's symptoms are suspicious for gastroesophageal reflux disease.  I informed her that given the duration of her symptoms and their presence, also the presence of dysphagia, that I would recommend an upper endoscopy.  I also put her on omeprazole and ranitidine.    FAMILY HISTORY:  For this patient is also negative for any known history of gastric or esophageal malignancy.    OVERALL IMPRESSION AND PLAN:  In summary, this is a 47 year old female with a history of symptoms consistent with gastroesophageal reflux disease, which have been persistent.  She also has associated dysphagia, and on the basis of this we have planned for her to undergo an upper endoscopy.  She filled out the paperwork and the forms and will have that test in the near future.  I discussed the overall recommendations as well as the management strategy with the patient, who was in full agreement.    I wish to thank you for this opportunity to participate in her care.  Further followup will be forthcoming pending the completion of this  evaluation.    Sincerely,    ___________________________  Reviewed and Electronically Signed By: Joretta Bachelor MD  Sig Date: 05/10/2012  Sig Time: 16:36:59  Dictated By: Joretta Bachelor MD  Dict Date: 05/08/2012 Dict Time: 03 07 PM    Dictation Date and Time:05/08/2012 15:07:40  Transcription Date and Time:05/08/2012 15:33:25  eScription Dictation id: [id number] Confirmation # :[id number]      cc: Gwenith Spitz MD    DICTATED BY: Joretta Bachelor MDALPHONSO [place], JR MDD:05/08/2012 15:07:40 T:05/08/2012 15:33:25 KN Job#: [id number]

## 2012-05-09 ENCOUNTER — Ambulatory Visit (HOSPITAL_BASED_OUTPATIENT_CLINIC_OR_DEPARTMENT_OTHER): Payer: PRIVATE HEALTH INSURANCE | Admitting: Urology

## 2012-05-13 ENCOUNTER — Ambulatory Visit: Payer: Self-pay | Admitting: Urology

## 2012-05-20 ENCOUNTER — Encounter (HOSPITAL_BASED_OUTPATIENT_CLINIC_OR_DEPARTMENT_OTHER): Payer: Self-pay | Admitting: Gastroenterology

## 2012-05-20 ENCOUNTER — Encounter (HOSPITAL_BASED_OUTPATIENT_CLINIC_OR_DEPARTMENT_OTHER): Payer: Self-pay | Admitting: Internal Medicine

## 2012-05-20 ENCOUNTER — Ambulatory Visit (HOSPITAL_BASED_OUTPATIENT_CLINIC_OR_DEPARTMENT_OTHER)
Admit: 2012-05-20 | Disposition: A | Discharge status: Home / Self Care | Payer: Self-pay | Source: Ambulatory Visit | Attending: Gastroenterology | Admitting: Gastroenterology

## 2012-05-20 DIAGNOSIS — K210 Gastro-esophageal reflux disease with esophagitis: Secondary | ICD-10-CM

## 2012-05-20 LAB — URINE PREGNANCY TEST (POINT OF CARE): HCG QUALITATIVE URINE: NEGATIVE

## 2012-05-20 LAB — GI OPERATIVE NOTE

## 2012-05-20 MED ORDER — MIDAZOLAM HCL 2 MG/2ML IJ SOLN
INTRAMUSCULAR | Status: AC
Start: 2012-05-20 — End: 2012-05-20
  Administered 2012-05-20: 3 mg via INTRAVENOUS
  Filled 2012-05-20: qty 4

## 2012-05-20 MED ORDER — DIPHENHYDRAMINE HCL 50 MG/ML IJ SOLN
25.0000 mg | Freq: Once | INTRAMUSCULAR | Status: DC | PRN
Start: 2012-05-20 — End: 2012-05-20

## 2012-05-20 MED ORDER — LACTATED RINGERS IV SOLN
INTRAVENOUS | Status: DC
Start: 2012-05-20 — End: 2012-05-20
  Administered 2012-05-20: 30 mL/h via INTRAVENOUS

## 2012-05-20 MED ORDER — DIPHENHYDRAMINE HCL 50 MG/ML IJ SOLN
25.0000 mg | Freq: Once | INTRAMUSCULAR | Status: AC | PRN
Start: 2012-05-20 — End: 2012-05-20
  Administered 2012-05-20: 25 mg via INTRAVENOUS

## 2012-05-20 MED ORDER — ONDANSETRON HCL 4 MG/2ML IJ SOLN
4.0000 mg | INTRAMUSCULAR | Status: DC | PRN
Start: 2012-05-20 — End: 2012-05-20

## 2012-05-20 MED ORDER — FENTANYL CITRATE 0.05 MG/ML IJ SOLN
25.0000 ug | INTRAMUSCULAR | Status: DC
Start: 2012-05-20 — End: 2012-05-20
  Administered 2012-05-20: 75 ug via INTRAVENOUS

## 2012-05-20 MED ORDER — DIPHENHYDRAMINE HCL 50 MG/ML IJ SOLN
INTRAMUSCULAR | Status: AC
Start: 2012-05-20 — End: 2012-05-20
  Administered 2012-05-20: 25 mg via INTRAVENOUS
  Filled 2012-05-20: qty 1

## 2012-05-20 MED ORDER — SIMETHICONE 40 MG/0.6ML PO SUSP
Freq: Once | ORAL | Status: AC
Start: 2012-05-20 — End: 2012-05-20
  Administered 2012-05-20: 13:00:00

## 2012-05-20 MED ORDER — MIDAZOLAM HCL 2 MG/2ML IJ SOLN
0.5000 mg | INTRAMUSCULAR | Status: DC
Start: 2012-05-20 — End: 2012-05-20
  Administered 2012-05-20: 3 mg via INTRAVENOUS

## 2012-05-20 MED ORDER — FENTANYL CITRATE 0.05 MG/ML IJ SOLN
INTRAMUSCULAR | Status: AC
Start: 2012-05-20 — End: 2012-05-20
  Administered 2012-05-20: 75 ug via INTRAVENOUS
  Filled 2012-05-20: qty 2

## 2012-05-20 NOTE — Discharge Instructions (Signed)
Await pathology  Continue antacid medications  Return to GI appt as scheduled

## 2012-05-20 NOTE — H&P (Signed)
Interval H&P    No interval changes from previous H&P  64F w/symptoms of abdominal pain, reflux, and dysphagia. She complains of intermittent abdominal discomfort, reflux and burning, and a bitter taste in her mouth, and more recently a sensation of food getting stuck in her throat. She has no family history of cancer. She has had one EGD 2-3 years ago, unclear what the findings were. She takes omeprazole at home.   Mallampati I, no dentures or loose teeth, full range of motion of neck.

## 2012-05-22 ENCOUNTER — Ambulatory Visit: Payer: Self-pay | Admitting: Urology

## 2012-05-22 LAB — SURGICAL PATH SPECIMEN

## 2012-05-23 LAB — US RENAL

## 2012-05-24 ENCOUNTER — Encounter (HOSPITAL_BASED_OUTPATIENT_CLINIC_OR_DEPARTMENT_OTHER): Payer: Self-pay | Admitting: Physician Assistant

## 2012-05-24 ENCOUNTER — Emergency Department (HOSPITAL_BASED_OUTPATIENT_CLINIC_OR_DEPARTMENT_OTHER)
Admission: RE | Admit: 2012-05-24 | Disposition: A | Discharge status: Home / Self Care | Payer: Self-pay | Source: Emergency Department | Attending: Physician Assistant | Admitting: Physician Assistant

## 2012-05-24 MED ORDER — ALUMINUM-MAGNESIUM-SIMETHICONE 200-200-20 MG/5ML PO SUSP
30.0000 mL | Freq: Four times a day (QID) | ORAL | Status: DC
Start: 2012-05-24 — End: 2012-05-24
  Administered 2012-05-24: 30 mL via ORAL
  Filled 2012-05-24: qty 30

## 2012-05-24 MED ORDER — BUTALBITAL-APAP-CAFFEINE 50-325-40 MG PO TABS
2.00 | ORAL_TABLET | Freq: Once | ORAL | Status: AC
Start: 2012-05-24 — End: 2012-05-24
  Administered 2012-05-24: 2 via ORAL
  Filled 2012-05-24: qty 2

## 2012-05-24 MED ORDER — DIPHENHYDRAMINE HCL 12.5 MG/5ML PO ELIX
25.00 mg | ORAL_SOLUTION | Freq: Once | ORAL | Status: AC
Start: 2012-05-24 — End: 2012-05-24
  Administered 2012-05-24: 25 mg via ORAL
  Filled 2012-05-24: qty 10

## 2012-05-24 MED ORDER — LIDOCAINE VISCOUS 2 % MT SOLN
5.00 mL | Freq: Once | OROMUCOSAL | Status: AC
Start: 2012-05-24 — End: 2012-05-24
  Administered 2012-05-24: 5 mL via OROMUCOSAL
  Filled 2012-05-24: qty 15

## 2012-05-24 MED ORDER — BUTALBITAL-APAP-CAFFEINE 50-325-40 MG PO TABS
1.0000 | ORAL_TABLET | ORAL | Status: AC | PRN
Start: 2012-05-24 — End: 2012-05-29

## 2012-05-24 NOTE — Discharge Instructions (Signed)
Avoid motrin, advil, ibuprofen, aleve and naproxen.  Maalox or Mylanta liquid - 2 teaspoons by mouth after each meal and at bedtime.  Continue with Omeprazole and Ranitidine daily as prescribed.  Fioricet -only as needed for headache. Fioricet may cause drowsiness - do not drive a car while taking this medication.  Call your primary care doctor tomorrow for a follow-up appointment within 1 week.  If symptoms persist, change or worsen - please return to the ER.    Evite Motrin, Advil, ibuprofeno e naproxeno aleve. Maalox ou Mylanta lquido - 2 colheres de ch por via oral aps cada refeio e na hora de dormir. Continue com Omeprazol e Ranitidina diria como prescrito. Fioricet somente conforme necessrio para dor de cabea. Fioricet pode causar sonolncia - no dirigir um carro enquanto tomar [employer]. Ligue para o seu mdico de cuidados primrios amanh para uma consulta de seguimento dentro de 1 semana. Se os sintomas persistirem, mudar ou piorar - por favor, devolva ao pronto-socorro.

## 2012-05-24 NOTE — ED Triage Note (Signed)
Endoscopy on Tuesday with biopsy for reflux disease Took Motrin yesterday and today and states "each time c/o feeling stuck in throat" Able to tolerate PO without issue

## 2012-05-24 NOTE — ED Notes (Signed)
Patient Disposition    Patient education for diagnosis, medications, activity, diet and follow-up.  Patient left ED 4:34 PM.  Patient rep received written instructions.  Interpreter to provide instructions: No    Discharged to: Discharged to home instructions reviewed.

## 2012-05-24 NOTE — ED Provider Notes (Signed)
eMERGENCY dEPARTMENT ENCOUNTER    The ED nursing record was reviewed.   The prior medical records as available electronically through Epic were reviewed.  The mode of arrival was Relative on 05/24/2012  3:12 PM.      CHIEF COMPLAINT    Patient presents with:    Chest Pain - S/P ENDOSCOPY (FEVER & BODY ACHES)    Pharyngitis      HPI    Deborah [name] is a 47 year old female who presents to the emergency department with c/o sore throat and acid reflux symptoms.  Patient had an endoscopy done 3 days ago which confirmed severe esophagitis with associated signs of chronic reflux.  Yesterday, the patient began having subjective myalgias, chills and nonproductive cough now with associated mild frontal headache.  No documented fevers.  No vomiting no diarrhea.  Patient is able to eat and drink.  No chest pain, pressure, tightness or palpitations.  No shortness of breath or dyspnea on exertion.    Patient had mentioned that she had taken a Motrin tablet last night for her symptoms of myalgias and frontal headache.  Patient states her throat symptoms got much worse after this.  Patient does have a documented history of a Motrin allergy which resulted in urticaria in the past.  Patient denies any skin manifestations at this time.      PAST MEDICAL HISTORY      Past Medical History    Irregular menstrual cycle     Pregnant state, incidental     Comment: c/s breech    Esophageal reflux     SVT (supraventricular tachycardia) 3/10    Comment: TCH       PROBLEM LIST  Patient Active Problem List:     LUMP OR MASS IN BREAST     ESOPHAGEAL REFLUX     GASTRITIS NEC W/O HEMORRH     Abdominal Pain, Epigastric     ANGIONEUROTIC EDEMA     Plantar Fasciitis, L     Leg Pain     Tobacco abuse, in remission     Triggering of Finger, R 3rd     Right subacromial bursitis/rotator cuff tendinopathy     Chondromalacia Patellae     Lower back pain     De Quervain's Tenosynovitis, R     CTS (Carpal Tunnel Syndrome), b/l     Family history of  diabetes mellitus (DM)     Hypolipoproteinemia     Intermittent Spinal Claudication     Vitamin D deficiency     Greater Trochanteric Bursitis, b/l     DDD (Degenerative Disc Disease), Lumbar     Obesity     SVT To 240 BPM March 2010     Palpitations     Rapid Noctural Palpitations x 10 Seconds     Sweating     Microscopic hematuria     Reflux esophagitis      SURGICAL HISTORY        Past Surgical History    [place] BREAST BIOPSY SPECIMEN      Comment s/p fibroadenoma on the L,     SEPTOPLASTY/SUBMUCOUS RESECJ W/WO CARTILAGE GRF      CESAREAN DELIVERY ONLY      Comment breech       CURRENT MEDICATIONS    1. omeprazole (PRILOSEC OTC) 20 MG tablet  Route: Oral VHQ:IONG 1 tablet by mouth 2 (two) times daily.  Dispense: 60 tablet Refill: 4    2. ranitidine (ZANTAC) 300 MG capsule  Route: Oral ZOX:WRUE 1 capsule by mouth every evening.  Dispense: 30 capsule Refill: 4    3. metoprolol (TOPROL-XL) 50 MG 24 hr tablet  Route: Oral AVW:UJWJ 1 tablet by mouth daily.  Dispense: 30 tablet Refill: 11    4. butalbital-acetaminophen-caffeine (FIORICET, ESGIC) per tablet  Route: Oral XBJ:YNWG 1 tablet by mouth every 4 (four) hours as needed for Pain.  Dispense: 10 tablet Refill: 0    5. omeprazole (PRILOSEC) 20 MG capsule  Route: Oral NFA:OZHY 20 mg by mouth daily. Pt states OTC and not sure of dose.  Dispense:  Refill:     6. cholecalciferol (VITAMIN D3) 2000 UNIT tablet  Route: Oral QMV:HQIO 1 tablet by mouth daily.  Dispense: 30 tablet Refill: 11      ALLERGIES    Review of Patient's Allergies indicates:   Ibuprofen               Other (See Comments)    Comment:Epigastric pain, throat swelling?             05/06/12 pt states uses sometimes without issue   Pollen extract-tree*    Runny Nose    Comment:HA, itchy watery eyes    FAMILY HISTORY      Family History    Hypertension Mother     Hypertension Father     Diabetes Mother     Diabetes Maternal Uncle     Diabetes Maternal Uncle     Cancer - Other FamHxNeg     Cancer - Breast FamHxNeg      Cancer - Colon FamHxNeg     Cancer - Lung FamHxNeg     Cancer - Ovarian FamHxNeg     Heart Maternal Grandmother     Heart Maternal Uncle     Heart Maternal Uncle        SOCIAL HISTORY    Social History    Marital Status: Divorced            Spouse Name:                       Years of Education:                 Number of children: 1             Occupational History  Occupation          Technical sales engineer                               Social History Main Topics    Smoking Status: Former Smoker                   Packs/Day: 0.50  Years: 27        Types: Cigarettes      Quit date: 06/28/2002    Alcohol Use: No              Drug Use: No              Sexual Activity: Not Currently     Partners with: Female       Comment: no STI hx; HIV neg 2002    Other Topics            Concern  Stress Concern  Yes    Comment:rev  Weight Concern          Yes    Comment:rev  Special Diet            Yes    Comment:rev  Exercise                No  Seat Belt               Yes    Comment:rev  Self-Exams              Yes    Comment:rev    Social History Narrative    [place], [place]. To [place] 2000.    Lives with her son and her parents. Divorced from husband.    Denies DV. No guns in home.        REVIEW OF SYSTEMS    The pertinent positives are reviewed in the HPI above. All other systems were reviewed and are negative.    PHYSICAL EXAM      Triage Vital Signs: BP 133/89   Pulse 112   Temp(Src) 99.8 F   Resp 18   Wt 78.019 kg   BMI 31.98 kg/m2   SpO2 100%   LMP 10/17/2006     Constitutional: Well-developed, Well-nourished, No acute distress, Non-toxic appearance. Speaking full sentences.  Eyes: PERRL, conjunctiva pink and moist, no scleral injection or discharge.  HENT: Atraumatic, TM's clear bilat., oropharynx moist, airway patent.  Neck: Normal range of motion, non-tender, supple, no stridor.   Lymphatic: No lymphadenopathy noted.   Cardio.: RRR, No RGM's.   Lungs: Normal BS's bilat., No tachypnea,  dyspnea, cough or wheeze.  Abd./Pelvic: Soft, NTND, No masses, rebound or guarding.   GU:  No CVA tenderness.  Extremities: Atraumatic. Full ROM TAE. NTTP. No edema, tenderness, cyanosis or clubbing. No major deformities noted. Ambulatory with steady gait.  Skin: Warm &dry, no rashes, diaphoresis or cellulitic changes.  Neurological: CAOx3, Normal motor and sensory function, No focal deficits noted.   Psychiatric: Normal Affect.      MEDICATIONS ADMINISTERED ON THIS VISIT    Medication Orders Placed This Encounter  aluminum-magnesium hydroxide-simethicone (MYLANTA II) 200-200-20 MG/5ML suspension 30 mL   Sig:   lidocaine (XYLOCAINE) 2 % viscous solution 5 mL   Sig:   diphenhydrAMINE (BENADRYL) 12.5 MG/5ML elixir 25 mg   Sig:   butalbital-acetaminophen-caffeine (FIORICET, ESGIC) per tablet 2 tablet   Sig:   butalbital-acetaminophen-caffeine (FIORICET, ESGIC) per tablet   Sig: Take 1 tablet by mouth every 4 (four) hours as needed for Pain.   Dispense:  10 tablet   Refill:  0    ED COURSE & MEDICAL DECISION MAKING    The patient is a healthy 47 year old female with a history of chronic reflux disease.  With severe esophagitis diagnosed on endoscopy just 3 days ago.  In addition to that, the patient is developing symptoms of a viral illness with myalgias nonproductive cough and mild headache.  Physical exam vital signs are unremarkable.  Patient was given symptomatic treatment in the emergency department for both her headache and reflux symptoms.  Patient states she felt much better and was discharged home.      CONDITION AT DISCHARGE  Stable and Improved    DISCHARGE DIAGNOSIS  Viral illness  GERD (gastroesophageal reflux disease)  Headache    NEW PRESCRIPTIONS FROM THIS VISIT   Ouida, Abeyta   Home Medication Instructions RUE:[id number]    Printed on:05/24/12 1619   Medication Information  butalbital-acetaminophen-caffeine (FIORICET, ESGIC) per tablet  Take 1 tablet by mouth every 4 (four)  hours as needed for Pain.             omeprazole (PRILOSEC) 20 MG capsule  Take 20 mg by mouth daily. Pt states OTC and not sure of dose.             ranitidine (ZANTAC) 300 MG capsule  Take 1 capsule by mouth every evening.                  FOLLOW-UP  The patient was discharge home in improved and stable condition.  The patient was instructed to follow-up with their primary care physician within the next 3 days and to return sooner with any worsening concerns or complaints. The patient agrees with the emergency department management and disposition, and verbalized understanding of the discharge instructions prior to departure.     Electronically signed by:   Blanchard Mane, PAC, 05/24/2012 4:19 PM  Dept.of Emergency Medicine  [facility]    This Emergency Department patient encounter note was created using voice-recognition software and in real time during the ED visit. Please excuse any typographical errors that have not been edited out.

## 2012-06-17 ENCOUNTER — Ambulatory Visit (HOSPITAL_BASED_OUTPATIENT_CLINIC_OR_DEPARTMENT_OTHER): Payer: PRIVATE HEALTH INSURANCE | Admitting: Urology

## 2012-06-17 VITALS — BP 120/72 | HR 62

## 2012-06-17 DIAGNOSIS — N200 Calculus of kidney: Secondary | ICD-10-CM

## 2012-06-17 DIAGNOSIS — R3129 Other microscopic hematuria: Secondary | ICD-10-CM

## 2012-06-17 LAB — URINE DIP (POINT OF CARE)
BILIRUBIN, URINE: NEGATIVE
GLUCOSE, URINE: NEGATIVE mg/dl
KETONE, URINE: NEGATIVE mg/dl
LEUKOCYTE ESTERASE: NEGATIVE
NITRITE, URINE: NEGATIVE
PH URINE: 5.5 (ref 5.0–8.0)
PROTEIN, URINE: NEGATIVE mg/dl (ref 0–15)
SPECIFIC GRAVITY URINE: 1.02 (ref 1.003–1.030)
UROBILINOGEN URINE: 0.2 mg/dl (ref 0.2–1.0)

## 2012-06-17 NOTE — Nursing Note (Signed)
>>   Deborah [name]     Mon Jun 17, 2012 10:03 AM  Cystoscope was inspected and set up for procedure- in good condition and free of tears. Pt. Was given pre and post-op directions in her language.

## 2012-06-17 NOTE — Progress Notes (Signed)
Patient presents with:    Recheck - Return in about 6 weeks (around 06/17/2012) for cystoscopy and check [place] and cytology.    ENTERED: 05/06/12-1516    ORDERED: NG TPREP [id number]    **FINAL DIAGNOSIS**    URINE (SOURCE UNSPECIFIED):  - ATYPICAL.  - CLUSTERS OF MILDLY ATYPICAL UROTHELIAL CELLS, ABNORMAL IN A VOIDED URINE.  SEE NOTE.  - RED BLOOD CELLS AND FEW NEUTROPHILS.    Note: The atypical clusters may represent a reactive process, however a low  grade papillary urothelial neoplasm cannot be excluded.    Exam Date: 05/22/12 Exam Status: Signed     Exam: RENAL ULTRASOUND   Reason for Exam: MICROHEMATURIA         CLINICAL INDICATION: Microhematuria.     Prior study: Abdominal pelvic CT with contrast from November 09, 2009     DESCRIPTION:     Renal ultrasound shows the right kidney to measure 10.3 cm in length.   There is an extrarenal pelvis. No hydronephrosis is appreciated. A   lower pole calculus measures 5 mm. The left kidney measures 10.3 cm in   length. There is no hydronephrosis or calculi appreciated. The renal   parenchyma appears unremarkable.     The urinary bladder is partially distended with urine and shows no   gross intraluminal abnormalities. After voiding, the bladder measured   0.9 x 2.4 x 4 cm for an estimated volume of 4.8 cc.     Note is made of a fatty liver.     IMPRESSION:     Nonobstructing right lower pole 5 mm calculus.     Procedure:  Cystoscopy  Pre-procedure DX: hematuria  Post-procedure DX: same  Surgeon: Orest Dikes  Anesthesia: local  EBL: none  Specimen removed: none  Complications: none    Time out was performed  Consent was obtained  Patient was prepped and draped in the usual sterile manner.  16 Fr flexible cystoscope was introduced into the urethra and both the urethra and bladder were visually inspected.  No masses, lesions, tumors, stones, or foreign bodies seen.  Pre and Post procedure vitals were documented  Patient tolerated the procedure.    AP  Re-check cytology  Check  CT scan for stone disease  FU in 6 weeks

## 2012-06-18 ENCOUNTER — Ambulatory Visit: Payer: Self-pay | Admitting: Urology

## 2012-06-18 LAB — CT ABDOMEN & PELVIS WO IV CONTRAST

## 2012-06-19 ENCOUNTER — Ambulatory Visit (HOSPITAL_BASED_OUTPATIENT_CLINIC_OR_DEPARTMENT_OTHER): Payer: PRIVATE HEALTH INSURANCE | Admitting: Gastroenterology

## 2012-06-19 VITALS — BP 107/65 | HR 65

## 2012-06-19 DIAGNOSIS — K3189 Other diseases of stomach and duodenum: Secondary | ICD-10-CM

## 2012-06-19 DIAGNOSIS — R12 Heartburn: Secondary | ICD-10-CM

## 2012-06-19 DIAGNOSIS — R1013 Epigastric pain: Principal | ICD-10-CM

## 2012-06-19 LAB — CYTOLOGY SPECIMEN

## 2012-06-19 NOTE — Progress Notes (Signed)
Here for results of gastroscopy. States the symptoms have not improved.    Safe at home

## 2012-07-16 NOTE — Progress Notes (Signed)
Judieth Keens, MD  [address]  [place], [place] [address]    Dear Dr. Judieth Keens, MD,    It is with great pleasure that I am seeing our mutual patient in the GI clinic at the [facility].     I am asked to see our patient in clinical consultation for evaluation of a chief complaint of midepigastric burning and abdominal pain.    Evaluation of this chief complaint led to the following visit diagnoses:   Dyspepsia and other specified disorders of function of stomach  (primary encounter diagnosis)  Heartburn  History of the Present Illness: This is a routine followup visit for this 47 year old female who presented for followup evaluation after a recent upper endoscopy biopsies were taken of the stomach and esophagus the gastric biopsies showed a small reactive gastropathy.  The esophageal biopsies showed evidence of mild chronic inflammation.  There is no Helicobacter pylori present.  On the basis of these findings I recommended that the patient continue on acid suppressant medications and for the next 4 months.    Problem List:  Patient Active Problem List:     LUMP OR MASS IN BREAST     ESOPHAGEAL REFLUX     GASTRITIS NEC W/O HEMORRH     Abdominal Pain, Epigastric     ANGIONEUROTIC EDEMA     Plantar Fasciitis, L     Leg Pain     Tobacco abuse, in remission     Triggering of Finger, R 3rd     Right subacromial bursitis/rotator cuff tendinopathy     Chondromalacia Patellae     Lower back pain     De Quervain's Tenosynovitis, R     CTS (Carpal Tunnel Syndrome), b/l     Family history of diabetes mellitus (DM)     Hypolipoproteinemia     Intermittent Spinal Claudication     Vitamin D deficiency     Greater Trochanteric Bursitis, b/l     DDD (Degenerative Disc Disease), Lumbar     Obesity     SVT To 240 BPM March 2010     Palpitations     Rapid Noctural Palpitations x 10 Seconds     Sweating     Microscopic hematuria     Reflux esophagitis    Past Surgical History:      Past Surgical History     [place] BREAST BIOPSY SPECIMEN      Comment s/p fibroadenoma on the L,     SEPTOPLASTY/SUBMUCOUS RESECJ W/WO CARTILAGE GRF      CESAREAN DELIVERY ONLY      Comment breech     Social History:    Smoking Status: Former Smoker                   Packs/Day: 0.50  Years: 27        Types: Cigarettes      Quit date: 06/28/2002    Alcohol Use: No              Family History:    Family History    Hypertension Mother     Hypertension Father     Diabetes Mother     Diabetes Maternal Uncle     Diabetes Maternal Uncle     Cancer - Other FamHxNeg     Cancer - Breast FamHxNeg     Cancer - Colon FamHxNeg     Cancer - Lung FamHxNeg     Cancer - Ovarian FamHxNeg  Heart Maternal Grandmother     Heart Maternal Uncle     Heart Maternal Uncle      Allergies:  Review of Patient's Allergies indicates:   Ibuprofen               Other (See Comments)    Comment:Epigastric pain, throat swelling?             05/06/12 pt states uses sometimes without issue   Pollen extract-tree*    Runny Nose    Comment:HA, itchy watery eyes  Current Medications:    Current Outpatient Prescriptions:  omeprazole (PRILOSEC) 20 MG capsule Take 20 mg by mouth daily. Pt states OTC and not sure of dose. Disp:  Rfl:    cholecalciferol (VITAMIN D3) 2000 UNIT tablet Take 1 tablet by mouth daily. Disp: 30 tablet Rfl: 11   metoprolol (TOPROL-XL) 50 MG 24 hr tablet Take 1 tablet by mouth daily. Disp: 30 tablet Rfl: 11     No current facility-administered medications for this visit.  Review of Systems:  All systems were otherwise negative except for the GI and other systems documented in the History of the Present Illness.  Physical Exam:  The patient is sitting in the examining table and appears to be in no apparent distress.  BP 107/65   Pulse 65   SpO2 100%   LMP 10/17/2006  HEENT: Normocephalic atraumatic  Oropharynx: Clear with no erythema or exudates  Neck: Supple no palpable lymphadenopathy in the neck or the axilla  Lungs: Clear to ausculation bilaterally  Heart:  Regular rate and rhythm,  no murmurs rubs or gallops  ZOX:WRUE non tender with positive bowel sounds.  Extremities: No swelling or edema  Skin: No rashes or lesions  Neuro: Alert and oriented to person , place and time    A/P:  Dyspepsia and other specified disorders of function of stomach  (primary encounter diagnosis)  Heartburn  Active GI  Problems Addressed During This Visit (Visit Diagnoses): In summary, this is a 47 year old female with a history of mild esophagitis and gastritis.  The recommendation is that she continue on a combination of acid suppressive medicine which include Prilosec and Zantac.  She'll followup with me in approximately 3-4 months so we can assess her clinical response to this regimen.  In the future we will attempt to wean the regimen and have her taken on an as-needed basis.  If she is unable to wean the regimen successfully in the future then we will likely keep her on a long-term and she would have to go on a calcium supplementation as long term PPI therapy has been shown to be associated with an increased risk of osteoporosis.  I wish to thank you for your opportunity to participate in her care.      Sincerely,    Farley Ly MD, MS [facility]    AV:[id number] Rachel Bo, MD

## 2012-08-12 ENCOUNTER — Ambulatory Visit (HOSPITAL_BASED_OUTPATIENT_CLINIC_OR_DEPARTMENT_OTHER): Payer: PRIVATE HEALTH INSURANCE | Admitting: Urology

## 2012-09-18 ENCOUNTER — Ambulatory Visit (HOSPITAL_BASED_OUTPATIENT_CLINIC_OR_DEPARTMENT_OTHER): Payer: PRIVATE HEALTH INSURANCE | Admitting: Gastroenterology

## 2012-10-10 ENCOUNTER — Ambulatory Visit (HOSPITAL_BASED_OUTPATIENT_CLINIC_OR_DEPARTMENT_OTHER): Payer: PRIVATE HEALTH INSURANCE | Admitting: Urology

## 2012-10-10 DIAGNOSIS — N200 Calculus of kidney: Secondary | ICD-10-CM

## 2012-10-10 NOTE — Progress Notes (Signed)
ENTERED: 06/17/12-1712    ORDERED: NG TPREP [id number]    >>FINAL DIAGNOSIS<<    VOIDED URINE:  - NEGATIVE FOR MALIGNANT CELLS.  - BENIGN SQUAMOUS AND UROTHELIAL CELLS.  - EVALUATION LIMITED BY DEGENERATIVE CHANGES.    Diagnosis by: Luanne Bras, MD      CT scan  Exam Date: 06/18/12 Exam Status: Signed     Exam: CT ABD/PELVIS WITHOUT CONTRAST   Reason for Exam: KIDNEY STONE         Examination: CT abdomen and pelvis without oral or IV contrast.     Indication: Right renal calculus.     Technique: Axial imaging was performed without oral or IV contrast   with patient in the prone position. Coronal and sagittal reformatted   images are done. Patient's prior ultrasound done on 05/22/2012 is   compared.     Findings: Punctate hyperdense areas in the kidneys may represent tiny   calcifications without evidence of obstruction. No calcifications are   seen in the ureter or in the urinary bladder.     Within the constraints of the noncontrast study the rest of the   abdomen and pelvis shows no abnormality. Visualized bones are   unremarkable.     Conclusion:   1. Questionable tiny punctate calcifications in the kidneys. 5 mm   calculus in the right kidney is no longer seen.     2. No evidence of obstructive uropathy.     AP  FU in 1 year with renal US  I have spent more than 10/15 minutes in face to face counseling time with this patient and have answered all questions about her stone disease.

## 2012-10-21 ENCOUNTER — Other Ambulatory Visit (HOSPITAL_BASED_OUTPATIENT_CLINIC_OR_DEPARTMENT_OTHER): Payer: Self-pay | Admitting: Internal Medicine

## 2012-10-21 DIAGNOSIS — I471 Supraventricular tachycardia, unspecified: Secondary | ICD-10-CM

## 2012-10-21 NOTE — Progress Notes (Signed)
PER [place], Deborah [name] is a 47 year old female has requested a refill of metoprolol (toprol xl) 50 mg    Last Office Visit: 04/09/2012    Other Med Adult:  Most Recent BP Reading(s)  06/19/12 : 107/65      Cholesterol (mg/dl)   Date  Value    1/61/0960  161    ----------  LDL DIRECT (mg/dl)   Date  Value    4/54/0981  87    ----------  HDL (mg/dl)   Date  Value    1/91/4782  47    ----------  TRIGLYCERIDES (mg/dl)   Date  Value    9/56/2130  296*   ----------      THYROID SCREEN TSH REFLEX FT4 (uIU/mL)   Date  Value    03/05/2012  2.73    ----------      TSH (THYROID STIM HORMONE) (uIU/mL)   Date  Value    12/22/2010  1.31    ----------    HEMOGLOBIN A1C (%)   Date  Value    06/30/2008  5.2    ----------      INR (no units)   Date  Value    06/15/2008  1.0*   ----------       Documented patient preferred pharmacies:     [facility]  Phone: [phone] Fax: [phone]

## 2012-10-22 ENCOUNTER — Ambulatory Visit (HOSPITAL_BASED_OUTPATIENT_CLINIC_OR_DEPARTMENT_OTHER): Payer: PRIVATE HEALTH INSURANCE | Admitting: Internal Medicine

## 2012-10-22 DIAGNOSIS — R002 Palpitations: Secondary | ICD-10-CM

## 2012-10-22 DIAGNOSIS — I471 Supraventricular tachycardia, unspecified: Secondary | ICD-10-CM

## 2012-10-22 MED ORDER — DILTIAZEM HCL ER COATED BEADS 120 MG PO CP24
120.0000 mg | ORAL_CAPSULE | Freq: Every day | ORAL | Status: DC
Start: 2012-10-22 — End: 2013-02-25

## 2012-10-22 NOTE — Progress Notes (Signed)
This office note has been dictated. Account number [id number]

## 2012-10-23 NOTE — Progress Notes (Signed)
Date of Service: 10/22/2012    October 22, 2012    Gwenith Spitz, MD  [facility]  [address]  [place], [place]  [address]    RE:  Deborah [name]    Dear Cleone Slim:    I saw Ms. Knodel in the office on 15 July in followup of her prior visit with me of 6 months ago regarding SVT to 240 beats per minute 3 years ago.  She has GERD and is a 47 year old mother of a 66 year old son, and she works long hours as a Advertising copywriter.  She is treated with metoprolol 50 and omeprazole 20 with no drug allergies.  A maternal uncle and a maternal grandmother had heart disease.  She uses no substances and lived in the [place] for a dozen years with her parents and her son.    A Holter a year ago showed virtually no ectopy, and an echo was normal.  On the other hand, she was awakening every other week with rapid palpitations lasting a few seconds occasionally with a recurrence.  My exam was unremarkable, and I noted her bloods were good.  She is here for a status check.  Her last EKG was last October and revealed, to my personal review of the raw data, sinus at 36 with some early repolarization and otherwise looks great.  Most recent labs were last November when her TSH was normal at 2.7, and the [facility] September 2012 was all normal.    Two weeks ago she was awakened at 0300 with rapid palpitations that lasted about 5 minutes.  There was no diaphoresis, nausea, chest pain, or dyspnea.  She tried multiple Valsalva maneuvers without success, but I am not sure she held it long enough, and I have asked her to hold it 15 seconds with a very high level of pressure, and then the release should result in a massive blood inflow to the heart with _____ vagal tone.  She did not try gagging, and that is another option for her.    There were 2 other episodes that same day.  She did take some caffeine, which is unusual for her, and noted that she had palpitations for that, as well.    She fills out our 10-system ROS form and indicates the issues  cited above, and all other systems are reviewed and are negative.    On exam, she has a heart rate of 70 and regular and a pressure of 126/80 by me.  Ocular muscles are intact, and the tongue is midline with symmetric face and excellent dentition.  No palpable cervical nodes or thyroid and no JVD.  The chest is clear.  The heart is regular without murmurs, clicks, or gallops.  The abdomen is soft without palpable organ, mass, or tenderness.  There is no rash, clubbing, cyanosis, or edema.  She is fully oriented and has appropriate affect.    My impression is that of recurrent symptoms of rapid tachycardia in a lady who has had it documented in the past.  Her current meds include metoprolol 50 and omeprazole.  The question here is whether we need to document it again or see if there is any progression of her cardiac status or add diltiazem 120, which would be a viable option.  Her resting heart rate now is 70, and when I look back at her Holter of January 2013 she was 80 with a range of 45-140, so she could tolerate some diltiazem.  When I look  back further, the Holter of 2011 saw negligible ectopy then, as well.  When I look back at my first note in seeing her, that was March 2010, but I do not see any notes me from then.  The note of September 2010 was in the same boat with no notes in EPIC.  When I search the notes section I do find something from 2011.  I do not find a thing until 2012, and all of them say it was dictated, but it is not there.  It was documented then in 2010 she had an SVT to 240.  I am not so sure it is important to document things again with a Holter, so I am going to give her diltiazem 120 CD and ask her to come back in 2 months and see how she is coming along.  I will report back to you at that time.    Thank you for the privilege of participating in her care.    Sincerely,    ___________________________  Reviewed and Electronically Signed By: Allegra Grana MD  Sig Date: 10/31/2012  Sig Time:  20:44:42  Dictated By: Allegra Grana MD  Dict Date: 10/22/2012 Dict Time: 05 18 PM    Dictation Date and Time:10/22/2012 17:18:57  Transcription Date and Time:10/23/2012 10:28:23  eScription Dictation id: [id number] Confirmation # :[id number]      cc: Gwenith Spitz MD

## 2012-11-27 ENCOUNTER — Emergency Department (HOSPITAL_BASED_OUTPATIENT_CLINIC_OR_DEPARTMENT_OTHER)
Admission: RE | Admit: 2012-11-27 | Disposition: A | Discharge status: Home / Self Care | Payer: Self-pay | Source: Emergency Department | Attending: Emergency Medicine | Admitting: Emergency Medicine

## 2012-11-27 ENCOUNTER — Encounter (HOSPITAL_BASED_OUTPATIENT_CLINIC_OR_DEPARTMENT_OTHER): Payer: Self-pay

## 2012-11-27 MED ORDER — AZITHROMYCIN 250 MG PO TABS
500.00 mg | ORAL_TABLET | Freq: Once | ORAL | Status: AC
Start: 2012-11-27 — End: 2012-11-27
  Administered 2012-11-27: 500 mg via ORAL
  Filled 2012-11-27: qty 2

## 2012-11-27 MED ORDER — ONDANSETRON HCL 4 MG/2ML IJ SOLN
8.00 mg | Freq: Once | INTRAMUSCULAR | Status: AC
Start: 2012-11-27 — End: 2012-11-27
  Administered 2012-11-27: 8 mg via INTRAVENOUS
  Filled 2012-11-27: qty 4

## 2012-11-27 MED ORDER — ACETAMINOPHEN 500 MG PO TABS
1000.00 mg | ORAL_TABLET | Freq: Once | ORAL | Status: AC
Start: 2012-11-27 — End: 2012-11-27
  Administered 2012-11-27: 1000 mg via ORAL
  Filled 2012-11-27: qty 2

## 2012-11-27 MED ORDER — AZITHROMYCIN 250 MG PO TABS
250.00 mg | ORAL_TABLET | Freq: Every day | ORAL | Status: AC
Start: 2012-11-27 — End: 2012-12-01

## 2012-11-27 MED ORDER — FAMOTIDINE IN NACL 20-0.9 MG/50ML-% IV SOLN
20.00 mg | Freq: Once | INTRAVENOUS | Status: AC
Start: 2012-11-27 — End: 2012-11-27
  Administered 2012-11-27: 20 mg via INTRAVENOUS
  Filled 2012-11-27: qty 50

## 2012-11-27 MED ORDER — SODIUM CHLORIDE 0.9 % IV BOLUS
1000.00 mL | Freq: Once | INTRAVENOUS | Status: AC
Start: 2012-11-27 — End: 2012-11-27
  Administered 2012-11-27: 1000 mL via INTRAVENOUS

## 2012-11-27 NOTE — ED Triage Note (Signed)
C/c 3 days of body aches, h/a fever 103 at home. Using motrin for fever, has stomach discomfort from this.

## 2012-11-27 NOTE — ED Notes (Signed)
Provider into see.

## 2012-11-27 NOTE — ED Notes (Signed)
PROVIDER INTO RECHECK. PT FEELING MUCH BETTER

## 2012-11-27 NOTE — Discharge Instructions (Signed)
You were seen in the ED and diagnosed with a pneumonia.    You may take the following medication for your fever:  -acetaminophen (Tylenol) 500mg  every 6 hours    Take antibiotic as prescribed:  -azithromycin 250mg  once per day for four days, next dose due tomorrow afternoon    Make sure to get rest and take plenty of fluid.    See your PCP if not improved in 3-5 days.    See attached for general care information.

## 2012-11-27 NOTE — ED Notes (Signed)
Patient Disposition    Patient education for diagnosis, medications, activity, diet and follow-up.  Patient left ED 9:34 PM.  Patient rep received written instructions.  Interpreter to provide instructions: No    Discharged to: Discharged to home ACCEPTING OF ALL INSTRUCTIONS.

## 2012-11-27 NOTE — ED Provider Notes (Signed)
I have reviewed the ED nursing notes and prior records. I have reviewed the patient's past medical history/problem list, allergies, social history and medication list.  I saw this patient primarily.    CC: fever    HPI:  This 47 year old female patient presents emergency Department with fever, nonproductive cough, myalgias, frontal headache, ongoing for the past 2-3 days.  Nonproductive cough.  Maximum temperature of 102.  Frontal headache, constant, aching, nonradiating, no neck pain or stiffness, no neuro complaints.  Has been taking ibuprofen 600 milligrams every 4 hours, after 2 days of this developed epigastric discomfort and nausea, started taking ranitidine as well.  She denies nasal congestion, ear pain, sore throat, chest pain, back pain, abdominal pain, urinary symptoms, diarrhea, melena, hematochezia, rash, sick contacts, recent travel.    ROS: Pertinent positives were reviewed as per the HPI above. All other systems were reviewed and are negative.    Past Medical History/Problem List:     Past Medical History    Irregular menstrual cycle     Pregnant state, incidental     Comment: c/s breech    Esophageal reflux     SVT (supraventricular tachycardia) 3/10    Comment: TCH     Patient Active Problem List:     LUMP OR MASS IN BREAST     ESOPHAGEAL REFLUX     GASTRITIS NEC W/O HEMORRH     Abdominal Pain, Epigastric     ANGIONEUROTIC EDEMA     Plantar Fasciitis, L     Leg Pain     Tobacco abuse, in remission     Triggering of Finger, R 3rd     Right subacromial bursitis/rotator cuff tendinopathy     Chondromalacia Patellae     Lower back pain     De Quervain's Tenosynovitis, R     CTS (Carpal Tunnel Syndrome), b/l     Family history of diabetes mellitus (DM)     Hypolipoproteinemia     Intermittent Spinal Claudication     Vitamin D deficiency     Greater Trochanteric Bursitis, b/l     DDD (Degenerative Disc Disease), Lumbar     Obesity     SVT To 240 BPM March 2010     Palpitations     Rapid Noctural  Palpitations x 10 Seconds     Sweating     Microscopic hematuria     Reflux esophagitis      Past Surgical History:       Past Surgical History    [place] BREAST BIOPSY SPECIMEN      Comment s/p fibroadenoma on the L,     SEPTOPLASTY/SUBMUCOUS RESECJ W/WO CARTILAGE GRF      CESAREAN DELIVERY ONLY      Comment breech       Medications:     No current facility-administered medications on file prior to encounter.  Current Outpatient Prescriptions on File Prior to Encounter:  diltiazem (CARDIZEM CD) 120 MG 24 hr capsule Take 1 capsule by mouth daily. Disp: 30 capsule Rfl: 11   metoprolol (TOPROL XL) 50 MG 24 hr tablet Take 1 tablet by mouth daily. Disp: 30 tablet Rfl: 11   omeprazole (PRILOSEC) 20 MG capsule Take 20 mg by mouth daily. Pt states OTC and not sure of dose. Disp:  Rfl:    cholecalciferol (VITAMIN D3) 2000 UNIT tablet Take 1 tablet by mouth daily. Disp: 30 tablet Rfl: 11       Social History:  Smoking status: Former Smoker  0.50 Packs/Day  For 27.00 Years     Types: Cigarettes    Quit date: 06/28/2002    Smokeless tobacco: Not on file    Alcohol Use: No       Allergies:   Review of Patient's Allergies indicates:   Ibuprofen               Other (See Comments)    Comment:Epigastric pain, throat swelling?             05/06/12 pt states uses sometimes without issue   Pollen extract-tree*    Runny Nose    Comment:HA, itchy watery eyes    Physical Exam:   BP 131/82   Pulse 115   Temp(Src) 99.6 F   Resp 20   Wt 77.111 kg (170 lb)   BMI 31.6 kg/m2   SpO2 98%   LMP 10/17/2006  GENERAL: Fatigued, nontoxic and no distress.  SKIN:  Warm & dry, no rash.  HEAD:  Atraumatic. Normocephalic.  Mucous membranes moist. Oropharynx no tonsillar erythema or exudates.  TMs normal.  NECK:  FROM, no cervical adenopathy.  No neck stiffness.  LUNGS: Decreased breath sounds at the left base, regular rate and effort.   HEART:  RRR.  No murmurs, rubs, or gallops.   ABDOMEN:  Soft, nontender, nondistended.    MUSCULOSKELETAL:  No  deformities. No peripheral edema.  GENITOURINARY:  No CVA tenderness.   NEUROLOGIC: Alert.  Normal speech.  Normal gait.  PSYCHIATRIC:  Normal affect    ED Course and Medical Decision-making:    The patient is 47 year old female with fever, headache, cough, malaise.  Exam notable for a tired appearing female, tachycardia to 115 nontoxic with no distress, slightly decreased breath sounds at the left base.  Oxygen saturation is normal.    Imaging: Chest x-ray shows increased perihilar markings on my read    Medications: Normal saline 1 liter bolus, Pepcid 20 milligrams IV, Zofran 8 milligrams IV, Tylenol 1000 milligrams orally, and azithromycin 500 milligrams orally    On reassessment, the patient appeared and felt significantly improved, her tachycardia had resolved.  I suspect she had mild dehydration associated with her symptoms which caused the tachycardia.  Given the increased perihilar markings on the chest x-ray, the site abnormality on the lung exam, as well as a fever and cough, the patient was diagnosed with a pneumonia.  First dose of azithromycin given in the emergency department.  She is stable for discharge at this time with a prescription for 4 more days of azithromycin.  She is to continue taking Tylenol as needed for fever and pain.  She is to followup with her primary care physician in the next 3-5 days if not improving.    The diagnosis, treatment, and follow-up were all discussed with the patient/family.  All questions were answered. Reasons to return to the ED were reviewed in detail. The patient/family agrees with this plan and disposition.    Disposition:  Discharged to home    Condition on  Discharge:  Improved and Stable    Diagnosis/Diagnoses:  Pneumonia    Levy Sjogren, MD

## 2012-11-28 ENCOUNTER — Emergency Department (HOSPITAL_BASED_OUTPATIENT_CLINIC_OR_DEPARTMENT_OTHER)
Admission: RE | Admit: 2012-11-28 | Disposition: A | Discharge status: Home / Self Care | Payer: Self-pay | Source: Emergency Department | Attending: Emergency Medicine | Admitting: Emergency Medicine

## 2012-11-28 LAB — CBC, PLATELET & DIFFERENTIAL
ABSOLUTE BASO COUNT: 0 10*3/uL (ref 0.0–0.1)
ABSOLUTE EOSINOPHIL COUNT: 0 10*3/uL (ref 0.0–0.8)
ABSOLUTE IMM GRAN COUNT: 0.02 10*3/uL (ref 0.00–0.03)
ABSOLUTE LYMPH COUNT: 1.6 10*3/uL (ref 0.6–5.9)
ABSOLUTE MONO COUNT: 0.9 10*3/uL (ref 0.2–1.4)
ABSOLUTE NEUTROPHIL COUNT: 5 10*3/uL (ref 1.6–8.3)
BASOPHIL %: 0.3 % (ref 0.0–1.2)
EOSINOPHIL %: 0.5 % (ref 0.0–7.0)
HEMATOCRIT: 36.7 % (ref 34.1–44.9)
HEMOGLOBIN: 12.3 g/dL (ref 11.2–15.7)
IMMATURE GRANULOCYTE %: 0.3 % (ref 0.0–0.4)
LYMPHOCYTE %: 20.8 % (ref 15.0–54.0)
MEAN CORP HGB CONC: 33.5 g/dL (ref 31.0–37.0)
MEAN CORPUSCULAR HGB: 29.4 pg (ref 26.0–34.0)
MEAN CORPUSCULAR VOL: 87.6 fL (ref 80.0–100.0)
MEAN PLATELET VOLUME: 9.7 fL (ref 8.7–12.5)
MONOCYTE %: 11.7 % (ref 4.0–13.0)
NEUTROPHIL %: 66.4 % (ref 40.0–75.0)
PLATELET COUNT: 184 10*3/uL (ref 150–400)
RBC DISTRIBUTION WIDTH STD DEV: 38 fL (ref 35.1–46.3)
RBC DISTRIBUTION WIDTH: 12.1 % (ref 11.5–14.3)
RED BLOOD CELL COUNT: 4.19 M/uL (ref 3.90–5.20)
WHITE BLOOD CELL COUNT: 7.5 10*3/uL (ref 4.0–11.0)

## 2012-11-28 LAB — POC URINALYSIS
BILIRUBIN, URINE: NEGATIVE
GLUCOSE,URINE: NEGATIVE
LEUKOCYTE ESTERASE: NEGATIVE
NITRITE, URINE: NEGATIVE
PH URINE: 5.5 (ref 5.0–8.0)
PROTEIN, URINE: NEGATIVE
SPECIFIC GRAVITY, URINE: 1.01 (ref 1.003–1.030)
UROBILINOGEN URINE: 0.2 (ref 0.2–1.0)

## 2012-11-28 LAB — COMPREHENSIVE METABOLIC PANEL
ALANINE AMINOTRANSFERASE: 28 U/L (ref 12–45)
ALBUMIN: 3.9 g/dL (ref 3.4–5.0)
ALKALINE PHOSPHATASE: 98 U/L (ref 45–117)
ANION GAP: 12 mmol/L (ref 5–15)
ASPARTATE AMINOTRANSFERASE: 23 U/L (ref 8–34)
BILIRUBIN TOTAL: 0.5 mg/dL (ref 0.2–1.0)
BUN (UREA NITROGEN): 11 mg/dL (ref 7–18)
CALCIUM: 9.5 mg/dL (ref 8.5–10.1)
CARBON DIOXIDE: 28 mmol/L (ref 21–32)
CHLORIDE: 100 mmol/L (ref 98–107)
CREATININE: 0.7 mg/dL (ref 0.4–1.2)
ESTIMATED GLOMERULAR FILT RATE: >60 mL/min (ref >60)
Glucose Random: 97 mg/dL (ref 74–160)
POTASSIUM: 4.1 mmol/L (ref 3.5–5.1)
SODIUM: 140 mmol/L (ref 136–145)
TOTAL PROTEIN: 7.7 g/dL (ref 6.4–8.2)

## 2012-11-28 LAB — LIPASE: LIPASE: 139 U/L (ref 73–393)

## 2012-11-28 LAB — XR CHEST 2 VIEWS

## 2012-11-28 LAB — URINE PREGNANCY TEST (POINT OF CARE): HCG QUALITATIVE URINE: NEGATIVE

## 2012-11-28 LAB — LACTIC ACID: LACTIC ACID: 0.9 mmol/L (ref 0.4–2.0)

## 2012-11-28 MED ORDER — AZITHROMYCIN 250 MG PO TABS
250.00 mg | ORAL_TABLET | Freq: Once | ORAL | Status: AC
Start: 2012-11-28 — End: 2012-11-28
  Administered 2012-11-28: 250 mg via ORAL
  Filled 2012-11-28: qty 1

## 2012-11-28 MED ORDER — ACETAMINOPHEN 325 MG PO TABS
650.00 mg | ORAL_TABLET | Freq: Once | ORAL | Status: AC
Start: 2012-11-28 — End: 2012-11-28
  Administered 2012-11-28: 650 mg via ORAL
  Filled 2012-11-28: qty 2

## 2012-11-28 MED ORDER — DIPHENHYDRAMINE HCL 50 MG/ML IJ SOLN
25.00 mg | Freq: Once | INTRAMUSCULAR | Status: AC
Start: 2012-11-28 — End: 2012-11-28
  Administered 2012-11-28: 25 mg via INTRAVENOUS
  Filled 2012-11-28: qty 1

## 2012-11-28 MED ORDER — ONDANSETRON HCL 4 MG PO TABS
4.00 mg | ORAL_TABLET | Freq: Four times a day (QID) | ORAL | Status: AC | PRN
Start: 2012-11-28 — End: 2012-12-03

## 2012-11-28 MED ORDER — SODIUM CHLORIDE 0.9 % IV BOLUS
1000.00 mL | Freq: Once | INTRAVENOUS | Status: AC
Start: 2012-11-28 — End: 2012-11-28
  Administered 2012-11-28: 1000 mL via INTRAVENOUS

## 2012-11-28 MED ORDER — METOCLOPRAMIDE HCL 5 MG/ML IJ SOLN
10.00 mg | Freq: Once | INTRAMUSCULAR | Status: AC
Start: 2012-11-28 — End: 2012-11-28
  Administered 2012-11-28: 10 mg via INTRAVENOUS
  Filled 2012-11-28: qty 2

## 2012-11-28 MED ORDER — FAMOTIDINE IN NACL 20-0.9 MG/50ML-% IV SOLN
20.00 mg | Freq: Once | INTRAVENOUS | Status: AC
Start: 2012-11-28 — End: 2012-11-28
  Administered 2012-11-28: 20 mg via INTRAVENOUS
  Filled 2012-11-28: qty 50

## 2012-11-28 MED ORDER — KETOROLAC TROMETHAMINE 30 MG/ML IJ SOLN
30.00 mg | Freq: Once | INTRAMUSCULAR | Status: AC
Start: 2012-11-28 — End: 2012-11-28
  Administered 2012-11-28: 30 mg via INTRAVENOUS
  Filled 2012-11-28: qty 1

## 2012-11-28 NOTE — ED Provider Notes (Signed)
eMERGENCY dEPARTMENT ENCOUNTER    The ED nursing record was reviewed.   The prior medical records as available electronically through Epic were reviewed.  The mode of arrival was Relative on 11/28/2012  3:15 PM.    Patient seen in conjunction with emergency Department attending physician Dr. Nancee Liter.      CHIEF COMPLAINT    Patient presents with:    Headache - FEVER, HEADACHE AND BODYACHES / ID    Abdominal Pain      HPI    Deborah [name] is a 47 year old female who presents to the emergency department with c/o  mild headache and epigastric pain for the past 2 days.  Subjective fevers, but no nausea, vomiting or chills.  No urinary symptoms.  Of note, the patient was seen yesterday in in the emergency department with symptoms of fever, nonproductive cough, myalgias and frontal headache for 3 days.  Chest x-ray last night was interpreted as increased perihilar markings.  Patient was diagnosed with suspected pneumonia and started on azithromycin.   Patient states her fever and cough improved after starting the antibiotic though her headache and mild fevers have persisted.        PAST MEDICAL HISTORY      Past Medical History    Irregular menstrual cycle     Pregnant state, incidental     Comment: c/s breech    Esophageal reflux     SVT (supraventricular tachycardia) 3/10    Comment: TCH       PROBLEM LIST  Patient Active Problem List:     LUMP OR MASS IN BREAST     ESOPHAGEAL REFLUX     GASTRITIS NEC W/O HEMORRH     Abdominal Pain, Epigastric     ANGIONEUROTIC EDEMA     Plantar Fasciitis, L     Leg Pain     Tobacco abuse, in remission     Triggering of Finger, R 3rd     Right subacromial bursitis/rotator cuff tendinopathy     Chondromalacia Patellae     Lower back pain     De Quervain's Tenosynovitis, R     CTS (Carpal Tunnel Syndrome), b/l     Family history of diabetes mellitus (DM)     Hypolipoproteinemia     Intermittent Spinal Claudication     Vitamin D deficiency     Greater Trochanteric Bursitis,  b/l     DDD (Degenerative Disc Disease), Lumbar     Obesity     SVT To 240 BPM March 2010     Palpitations     Rapid Noctural Palpitations x 10 Seconds     Sweating     Microscopic hematuria     Reflux esophagitis      SURGICAL HISTORY        Past Surgical History    [place] BREAST BIOPSY SPECIMEN      Comment s/p fibroadenoma on the L,     SEPTOPLASTY/SUBMUCOUS RESECJ W/WO CARTILAGE GRF      CESAREAN DELIVERY ONLY      Comment breech       CURRENT MEDICATIONS    1. azithromycin (ZITHROMAX) 250 MG tablet  Route: Oral ZOX:WRUE 1 tablet by mouth daily.  Dispense: 6 tablet Refill: 0    2. diltiazem (CARDIZEM CD) 120 MG 24 hr capsule  Route: Oral AVW:UJWJ 1 capsule by mouth daily.  Dispense: 30 capsule Refill: 11    3. metoprolol (TOPROL XL) 50 MG 24 hr tablet  Route:  Oral RUE:AVWU 1 tablet by mouth daily.  Dispense: 30 tablet Refill: 11    4. omeprazole (PRILOSEC) 20 MG capsule  Route: Oral JWJ:XBJY 20 mg by mouth daily. Pt states OTC and not sure of dose.  Dispense:  Refill:     5. cholecalciferol (VITAMIN D3) 2000 UNIT tablet  Route: Oral NWG:NFAO 1 tablet by mouth daily.  Dispense: 30 tablet Refill: 11      ALLERGIES    Review of Patient's Allergies indicates:   Ibuprofen               Other (See Comments)    Comment:Epigastric pain, throat swelling?             05/06/12 pt states uses sometimes without issue   Pollen extract-tree*    Runny Nose    Comment:HA, itchy watery eyes    FAMILY HISTORY      Family History    Hypertension Mother     Hypertension Father     Diabetes Mother     Diabetes Maternal Uncle     Diabetes Maternal Uncle     Cancer - Other FamHxNeg     Cancer - Breast FamHxNeg     Cancer - Colon FamHxNeg     Cancer - Lung FamHxNeg     Cancer - Ovarian FamHxNeg     Heart Maternal Grandmother     Heart Maternal Uncle     Heart Maternal Uncle        SOCIAL HISTORY    Social History    Marital Status: Divorced            Spouse Name:                       Years of Education:                 Number of children:  1             Occupational History  Occupation          Technical sales engineer                               Social History Main Topics    Smoking Status: Former Smoker                   Packs/Day: 0.50  Years: 27        Types: Cigarettes      Quit date: 06/28/2002    Alcohol Use: No              Drug Use: No              Sexual Activity: Not Currently     Partners with: Female       Comment: no STI hx; HIV neg 2002    Other Topics            Concern  Stress Concern          Yes    Comment:rev  Weight Concern          Yes    Comment:rev  Special Diet            Yes    Comment:rev  Exercise  No  Seat Belt               Yes    Comment:rev  Self-Exams              Yes    Comment:rev    Social History Narrative    [place], [place]. To [place] 2000.    Lives with her son and her parents. Divorced from husband.    Denies DV. No guns in home.        REVIEW OF SYSTEMS    The pertinent positives are reviewed in the HPI above. All other systems were reviewed and are negative.    PHYSICAL EXAM      Triage Vital Signs: BP 144/87   Pulse 103   Temp(Src) 99 F   Resp 20   Wt 77.565 kg   BMI 31.79 kg/m2   SpO2 98%   LMP 10/17/2006     Constitutional: Well-developed, Well-nourished, No acute distress, Non-toxic appearance. Speaking full sentences.  Eyes: PERRL, conjunctiva pink and moist, no scleral injection or discharge.  No photosensitivity.  HENT: Atraumatic, TM's clear bilat., oropharynx moist, airway patent.  Neck: Normal range of motion, non-tender, supple, no stridor.  No meningeal signs.  Lymphatic: No lymphadenopathy noted.   Cardio.: RRR, No RGM's.   Lungs: Diminished BS's bilat., No tachypnea, dyspnea or wheeze.  Occasional coughing in the emergency department.  Abd./Pelvic: Soft, ND, nonspecific mid abdominal/epigastric tenderness with palpation.  No masses, rebound or guarding.   GU:  No CVA tenderness.  Extremities: Atraumatic. Full ROM TAE. Ambulatory with steady gait.  Skin:  Warm &dry, no rashes, diaphoresis or cellulitic changes.  Neurological: CAOx3, Normal motor and sensory function, No focal deficits noted.   Psychiatric: Normal Affect.    RESULTS  CBC with differential  - unremarkable  Comprehensive metabolic panel - unremarkable  Lipase - normal  Lactic acid - normal  Urine dip - trace ketones and small blood.  Not consistent with with acute cystitis  Urine hCG - negative        MEDICATIONS ADMINISTERED ON THIS VISIT    Medication Orders Placed This Encounter  sodium chloride 0.9 % IV bolus 1,000 mL   Sig:   metoclopramide (REGLAN) injection 10 mg   Sig:   diphenhydrAMINE (BENADRYL) injection 25 mg   Sig:   famotidine (PEPCID) IVPB 20 mg   Sig:   ketorolac (TORADOL) injection 30 mg   Sig:   acetaminophen (TYLENOL) tablet 650 mg   Sig:     ED COURSE & MEDICAL DECISION MAKING    I reviewed the patient's past medical history/problem list, past surgical history, medication list, social history and allergies.   Arrival: Pt arrived in stable condition and required no immediate interventions.  Pertinent Labs & Imaging studies reviewed.  Patient is a 47 year old female presenting with subjective fevers, frontal headache and abdominal pain.  Patient was seen in the emergency Department the [facility] hospital last night with similar symptoms and associated cough and diagnosed with pneumonia.  She was started on azithromycin last night.  Patient states her respiratory symptoms have improved of the frontal headache persists and now having abdominal pain.  Patient's abdomen was soft nondistended with mild abdominal tenderness.  Vital signs are stable other than mild tachycardia with heart rate of 103.  Remainder physical exam is unremarkable without focal neuro deficit.  We did not feel the patient required emergent radiologic imaging and of the brain.  We did not suspect meningitis.  Labs including  lactic acid were unremarkable.  Patient was treated with IV fluids, antiemetics and pain  medication with full resolution of her symptoms.  Serial abdominal exams revealed resolution of tenderness.  Patient was able to take oral fluids in the emergency department and was discharged home in care of family.  She was encouraged follow up with her primary care physician within the next week.        CONDITION AT DISCHARGE  Stable and Improved    DISCHARGE DIAGNOSIS  Pneumonia      NEW PRESCRIPTIONS FROM THIS VISIT  Zofran ODT when necessary    FOLLOW-UP  The patient was discharge home in improved and stable condition.  The patient was instructed to follow-up with their primary care physician within the next 3 days and to return sooner with any worsening concerns or complaints. The patient agrees with the emergency department management and disposition, and verbalized understanding of the discharge instructions prior to departure.     Electronically signed by:   Steffanie Rainwater, PAC, 11/28/2012 4:45 PM  Dept.of Emergency Medicine  [facility]    This Emergency Department patient encounter note was created using voice-recognition software and in real time during the ED visit. Please excuse any typographical errors that have not been edited out.

## 2012-11-28 NOTE — ED Notes (Signed)
Vss. Re eval by PA. Iv dc'd . Dc instructed

## 2012-11-28 NOTE — ED Notes (Signed)
Patient Disposition    Patient education for diagnosis, medications, activity, diet and follow-up.  Patient left ED 5:41 PM.  Patient rep received written instructions.  Interpreter to provide instructions: No    Discharged to: Discharged to home

## 2012-11-28 NOTE — ED Provider Notes (Signed)
Left message at 9:15 AM regarding the need for a repeat x-ray in 6-8 weeks to ensure resolution of pneumonia and possible hilar adenopathy.  Certified letter sent.  Discrepancy left in the quenue

## 2012-11-28 NOTE — ED Triage Note (Signed)
Headache and epigastric pain since yesterday.

## 2012-11-28 NOTE — Discharge Instructions (Signed)
Increase oral fluids.  Zofran only as needed for nausea. Continue with antibiotics as prescribed.  Tylenol as needed for pain or fever.  Avoid Motrin, Advil or ibuprofen. Call your primary care doctor to schedule a followup appointment within 1 week.  If symptoms return and persist or change or worsen please return to the emergency department.    Increase oral fluids.  Zofran only as needed for nausea. Continue with antibiotics as prescribed.  Tylenol as needed for pain or fever.  Avoid Motrin, Advil or ibuprofen. Call your primary care doctor to schedule a followup appointment within 1 week.  If symptoms return and persist or change or worsen please return to the emergency department.

## 2012-11-29 NOTE — ED Notes (Signed)
I spoke with the patient- she is feeling improved.  She is now aware of need for repeat xray in 6-8 weeks.    She will follow up with her PCP.  I will remove her from the discrepancy cue  Nancee Liter

## 2012-11-29 NOTE — ED Provider Notes (Signed)
Pt seen at [facility] ED for epigastric pain and N/V on 8/21. Was given azithro to continue rx for pneumonia. Provider note from that visit not yet available, unknown whether pt informed of need for repeat CXR in 6-8 weeks. I again tried to contact pt at phone number listed, no answer. Certified letter sent yesterday. Will leave discrepancy in cue.    Deborah Athens, MD

## 2012-12-01 NOTE — ED Provider Notes (Signed)
I saw this patient with PA Blanchard Mane on 11/28/2012. Please see his note for additional information.      HPI: This is a 47 year old female who presents the emergency department with headache and cough and general malaise.  The patient was seen the day prior and diagnosed with pneumonia.  The headache was gradual in onset it is intermittent and it is not the worse headache of her life.  SHe was started on azithromycin for which she has had one dose.  She has had decreased PO intake and continues to feel unwell.     No chest pain or shortness of breath. She has been coughing intermittently.        ROS: Pertinent positives were reviewed as per the HPI above. All other systems were reviewed and are negative.    Nursing notes were reviewed.  Medication list was reviewed.      Past Medical History:    Past Medical History    Irregular menstrual cycle     Pregnant state, incidental     Comment: c/s breech    Esophageal reflux     SVT (supraventricular tachycardia) 3/10    Comment: TCH       Past Surgical History:      Past Surgical History    [place] BREAST BIOPSY SPECIMEN      Comment s/p fibroadenoma on the L,     SEPTOPLASTY/SUBMUCOUS RESECJ W/WO CARTILAGE GRF      CESAREAN DELIVERY ONLY      Comment breech       Medications:     No current facility-administered medications on file prior to encounter.  Current Outpatient Prescriptions on File Prior to Encounter:  azithromycin (ZITHROMAX) 250 MG tablet Take 1 tablet by mouth daily. Disp: 6 tablet Rfl: 0   diltiazem (CARDIZEM CD) 120 MG 24 hr capsule Take 1 capsule by mouth daily. Disp: 30 capsule Rfl: 11   metoprolol (TOPROL XL) 50 MG 24 hr tablet Take 1 tablet by mouth daily. Disp: 30 tablet Rfl: 11   omeprazole (PRILOSEC) 20 MG capsule Take 20 mg by mouth daily. Pt states OTC and not sure of dose. Disp:  Rfl:    cholecalciferol (VITAMIN D3) 2000 UNIT tablet Take 1 tablet by mouth daily. Disp: 30 tablet Rfl: 11       Social History:    Social History   Marital Status:  Divorced  Spouse Name: N/A    Years of Education: N/A  Number of Children: 1     Occupational History  housecleaning       Social History Main Topics   Smoking status: Former Smoker  0.50 Packs/Day  For 27.00 Years     Types: Cigarettes    Quit date: 06/28/2002    Smokeless tobacco: Not on file    Alcohol Use: No    Drug Use: No    Sexually Active: Not Currently  Partner(s): Female    Comment: no STI hx; HIV neg 2002     Other Topics Concern    Stress Concern Yes    Comment: rev    Weight Concern Yes    Comment: rev    Special Diet Yes    Comment: rev    Exercise No    Seat Belt Yes    Comment: rev    Self-Exams Yes    Comment: rev     Social History Narrative    Deborah [name], Deborah [name]. To [place] 2000.    Lives with her son and  her parents. Divorced from husband.    Denies DV. No guns in home.     The patient lives with family      Allergies:  Review of Patient's Allergies indicates:   Ibuprofen               Other (See Comments)    Comment:Epigastric pain, throat swelling?             05/06/12 pt states uses sometimes without issue   Pollen extract-tree*    Runny Nose    Comment:HA, itchy watery eyes    Physical Exam:  BP 99/61   Pulse 96   Temp(Src) 99 F   Resp 16   Wt 77.565 kg (171 lb)   BMI 31.79 kg/m2   SpO2 98%   LMP 10/17/2006    General: mildly ill appearing  HEENT: No signs of trauma to the head.  PERRL, EOMI, mucous membranes dry  Neck: FROM, supple, no pain with ROM  Respiratory: No respiratory distress.   Lungs are clear bilaterally.  No wheezes.  Heart: Regular rate and rhythm, no murmur  Abd: Soft, normal bowel sounds, mildly diffusely tender, worse in epigastrium.  No rebound or guarding  MS: No signs of trauma, non-tender, no edema.  Normal radial and DP pulses bilaterally  Neuro: A&Ox3, CN 2-12 intact with 8 not tested. Motor strength 5/5 upper and lower extremities bilaterally.  Normal gait.  Skin: No rash    Psych: flat affect      Notes:  Results for SOLIANA, KITKO (MRN [id number]) as of 12/01/2012  16:46   Ref. Range 11/28/2012 15:49   WHITE BLOOD CELL Latest Range: 4.0-11.0 TH/uL 7.5   RED BLOOD CELL COUNT Latest Range: 3.90-5.20 M/uL 4.19   HEMOGLOBIN Latest Range: 11.2-15.7 g/dL 86.5   HEMATOCRIT Latest Range: 34.1-44.9 % 36.7   MEAN CORPUSCULAR VOL Latest Range: 80.0-100.0 fL 87.6   MEAN CORPUSCULAR HGB Latest Range: 26.0-34.0 pg 29.4   MEAN CORP HGB CONC Latest Range: 31.0-37.0 g/dL 78.4   RBC DISTRIBUTION WIDTH STD DEV Latest Range: 35.1-46.3 fL 38.0   RBC DISTRIBUTION WIDTH Latest Range: 11.5-14.3 % 12.1   PLATELET COUNT Latest Range: 150-400 TH/uL 184   MEAN PLATELET VOLUME Latest Range: 8.7-12.5 fL 9.7   IMMATURE GRANULOCYTE % Latest Range: 0.0-0.4 % 0.3   NEUTROPHIL % Latest Range: 40.0-75.0 % 66.4   LYMPHOCYTE % Latest Range: 15.0-54.0 % 20.8   MONOCYTE % Latest Range: 4.0-13.0 % 11.7   EOSINOPHIL % Latest Range: 0.0-7.0 % 0.5   BASOPHIL % Latest Range: 0.0-1.2 % 0.3   ABSOLUTE NEUT COUNT AUTO Latest Range: 1.6-8.3 TH/uL 5.0   ABSOLUTE IMM GRAN COUNT Latest Range: 0.00-0.03 TH/uL 0.02   ABSOLUTE LYMPH COUNT AUTO Latest Range: 0.6-5.9 TH/uL 1.6   ABSOLUTE MONO COUNT AUTO Latest Range: 0.2-1.4 TH/uL 0.9   ABSOLUTE EO COUNT AUTO Latest Range: 0.0-0.8 TH/uL 0.0   ABSOLUTE BASO COUNT AUTO Latest Range: 0.0-0.1 TH/uL 0.0   SODIUM Latest Range: 136-145 mmol/L 140   POTASSIUM Latest Range: 3.5-5.1 mmol/L 4.1   CHLORIDE Latest Range: 98-107 mmol/L 100   CARBON DIOXIDE Latest Range: 21-32 mmol/L 28   ANION GAP Latest Range: 5-15 mmol/L 12   BLOOD UREA NITROGEN Latest Range: 7-18 mg/dL 11   CREATININE Latest Range: 0.4-1.2 mg/dL 0.7   ESTIMATED GLOMERULAR FILT RATE Latest Range: > 60 ML/MIN > 60   Glucose Random Latest Range: 74-160 mg/dL 97   CALCIUM Latest Range: 8.5-10.1 mg/dL 9.5  BILIRUBIN TOTAL Latest Range: 0.2-1.0 mg/dL 0.5   ASPARTATE AMINOTRANSFERASE (AST) Latest Range: 8-34 U/L 23   ALANINE AMINOTRANSFERASE (ALT) Latest Range: 12-45 U/L 28   ALKALINE PHOSPHATASE Latest Range: 45-117 U/L 98   LIPASE  Latest Range: 73-393 U/L 139   TOTAL PROTEIN Latest Range: 6.4-8.2 g/dL 7.7   ALBUMIN Latest Range: 3.4-5.0 g/dL 3.9       ED Course and Medical Decision-making: This is a 47 year old female with recent diagnosis of pneumonia who presents with headache and epigastric discomfort and evidence of dehydration.  She was given IV fluids and toradol, pepcid as well as reglan and benadryl.  Labs were done and were unremarkable. After the treatments, she is feeling much improved and asks to be discharged. She was given her daily dose of azithromycin.      Reasons to return to the ED were reviewed in detail. The patient agrees with this plan and disposition.      Impression: headache, dehydration, epigastric pain      Nancee Liter MD

## 2012-12-07 NOTE — ED Provider Notes (Signed)
I performed a history and physical examination of the patient and discussed his management with the PA. I reviewed the PA's note and agree with the documented finding and plan of care. For further details of the patient's care and evaluation, please see my note from the same visit.    Nancee Liter MD

## 2012-12-24 ENCOUNTER — Ambulatory Visit (HOSPITAL_BASED_OUTPATIENT_CLINIC_OR_DEPARTMENT_OTHER): Payer: PRIVATE HEALTH INSURANCE | Admitting: Internal Medicine

## 2013-02-25 ENCOUNTER — Encounter (HOSPITAL_BASED_OUTPATIENT_CLINIC_OR_DEPARTMENT_OTHER): Payer: Self-pay | Admitting: Internal Medicine

## 2013-02-25 ENCOUNTER — Ambulatory Visit (HOSPITAL_BASED_OUTPATIENT_CLINIC_OR_DEPARTMENT_OTHER): Payer: PRIVATE HEALTH INSURANCE | Admitting: Internal Medicine

## 2013-02-25 VITALS — BP 120/76 | HR 80 | Temp 97.3°F | Ht 61.5 in | Wt 169.5 lb

## 2013-02-25 DIAGNOSIS — R1013 Epigastric pain: Secondary | ICD-10-CM

## 2013-02-25 DIAGNOSIS — R9389 Abnormal findings on diagnostic imaging of other specified body structures: Secondary | ICD-10-CM

## 2013-02-25 DIAGNOSIS — R002 Palpitations: Secondary | ICD-10-CM

## 2013-02-25 DIAGNOSIS — R2241 Localized swelling, mass and lump, right lower limb: Secondary | ICD-10-CM

## 2013-02-25 DIAGNOSIS — R2240 Localized swelling, mass and lump, unspecified lower limb: Secondary | ICD-10-CM

## 2013-02-25 DIAGNOSIS — M25561 Pain in right knee: Secondary | ICD-10-CM

## 2013-02-25 DIAGNOSIS — K219 Gastro-esophageal reflux disease without esophagitis: Secondary | ICD-10-CM

## 2013-02-25 DIAGNOSIS — N951 Menopausal and female climacteric states: Secondary | ICD-10-CM

## 2013-02-25 DIAGNOSIS — R062 Wheezing: Secondary | ICD-10-CM | POA: Insufficient documentation

## 2013-02-25 DIAGNOSIS — E559 Vitamin D deficiency, unspecified: Secondary | ICD-10-CM

## 2013-02-25 DIAGNOSIS — Z0000 Encounter for general adult medical examination without abnormal findings: Secondary | ICD-10-CM

## 2013-02-25 HISTORY — DX: Localized swelling, mass and lump, unspecified lower limb: R22.40

## 2013-02-25 MED ORDER — OMEPRAZOLE 20 MG PO CPDR
20.0000 mg | DELAYED_RELEASE_CAPSULE | Freq: Every day | ORAL | Status: DC
Start: 2013-02-25 — End: 2014-11-26

## 2013-02-25 MED ORDER — VITAMIN D 50 MCG (2000 UT) PO TABS
2000.0000 [IU] | ORAL_TABLET | Freq: Every day | ORAL | Status: DC
Start: 2013-02-25 — End: 2014-07-09

## 2013-02-25 MED ORDER — CALCIUM CARBONATE-VITAMIN D 600-400 MG-UNIT PO TABS
1.00 | ORAL_TABLET | Freq: Two times a day (BID) | ORAL | Status: AC
Start: 2013-02-25 — End: 2014-02-25

## 2013-02-25 NOTE — Progress Notes (Signed)
HPI Comments: Deborah [name] is a 47 year old female    Review of Patient's Allergies indicates:   Ibuprofen               Other (See Comments)    Comment:Epigastric pain, throat swelling?             05/06/12 pt states uses sometimes without issue   Pollen extract-tree*    Runny Nose    Comment:HA, itchy watery eyes    Current Outpatient Prescriptions:  cholecalciferol (VITAMIN D3) 2000 UNIT tablet, Take 1 tablet by mouth daily., Disp: 30 tablet, Rfl: 11  omeprazole (PRILOSEC) 20 MG capsule, Take 1 capsule by mouth daily. Pt states OTC and not sure of dose., Disp: 30 capsule, Rfl: 11  Calcium Carbonate-Vitamin D (CALCIUM 600+D) 600-400 MG-UNIT TABS Tablet, Take 1 tablet by mouth 2 (two) times daily., Disp: 60 tablet, Rfl: 11  metoprolol (TOPROL XL) 50 MG 24 hr tablet, Take 1 tablet by mouth daily., Disp: 30 tablet, Rfl: 11  No current facility-administered medications for this visit.    Patient Active Problem List:     LUMP OR MASS IN BREAST     ESOPHAGEAL REFLUX     GASTRITIS NEC W/O HEMORRH     Abdominal Pain, Epigastric     ANGIONEUROTIC EDEMA     Plantar Fasciitis, L     Leg Pain     Tobacco abuse, in remission     Triggering of Finger, R 3rd     Right subacromial bursitis/rotator cuff tendinopathy     Chondromalacia Patellae     Lower back pain     De Quervain's Tenosynovitis, R     CTS (Carpal Tunnel Syndrome), b/l     Family history of diabetes mellitus (DM)     Hypolipoproteinemia     Intermittent Spinal Claudication     Vitamin D deficiency     Greater Trochanteric Bursitis, b/l     DDD (Degenerative Disc Disease), Lumbar     Obesity     SVT To 240 BPM March 2010     Palpitations     Rapid Noctural Palpitations x 10 Seconds     Sweating     Microscopic hematuria     Reflux esophagitis     Lump of skin of lower extremity    Social History    Marital Status: Divorced            Spouse Name:                       Years of Education:                 Number of children: 1             Occupational  History  Occupation          Technical sales engineer                               Social History Main Topics    Smoking Status: Former Smoker                   Packs/Day: 0.50  Years: 27        Types: Cigarettes      Quit date:  06/28/2002    Smokeless Status: Never Used                        Alcohol Use: No              Drug Use: No              Sexual Activity: Not Currently     Partners with: Female       Comment: no STI hx; HIV neg 2002    Other Topics            Concern  Stress Concern          Yes    Comment:rev  Weight Concern          Yes    Comment:rev  Special Diet            Yes    Comment:rev  Exercise                No  Seat Belt               Yes    Comment:rev  Self-Exams              Yes    Comment:rev    Social History Narrative    Deborah [name], [place]. To [place] 2000.    Lives with her son and her parents. Divorced from husband.    Denies DV. No guns in home.      Pt here for CPE and f/u re abnormal CXR, GERD, palpitations, knee and back pain and with c/o wheezing and lump at R foot    Notes that she was treated for PNA in 11/2012 with a recommendation for f/u CXR in 01/2013, which she did not pursue. She has had a few episodes of wheezing, both at rest and with exertion, as well as throat clearing and mild cough. Denies any associated SOB.    Notes enlarging lump at top of R foot x 1 year that is increasingly red and occasionally is extremely painful, with deep throbbing pain. Denies any trauma to area prior to onset of symptoms. No TTP.    Notes worsening R knee pain that is exacerbated by twisting motion with sensation that her knee is "going to dislocate." Also notes chronic lower back pain radiating post to post R knee, as well as b/l shoulder, b/l and L knee pain. Notes that knee and other pains are transiently improved with ibuprofen taken with food, but that this produces worsening epigastric pain.    Notes severe epigastric burning pain with coffee, bread and rice  intake, all of which she has decreased with improved symptoms. Continues to have episodes of epigastric burning pain, "like my lungs are being squeezed together," somewhat improved with Ranitidine use. Denies any associated N/V/D or blood in stool.        Review of Systems   Constitutional: Negative for weight loss.   Eyes: Negative for blurred vision.        Glasses in use   Respiratory:        See HPI   Cardiovascular: Negative for chest pain and palpitations.        Notes palpitations resolved many months ago and she stopped Diltiazem after a few doses as she thought it produced malaise   Gastrointestinal: Positive for heartburn and abdominal pain. Negative for nausea, vomiting, diarrhea, constipation and blood in stool.        Notes continued  GERD with epigastric burning pain;    Musculoskeletal: Positive for back pain and joint pain.   Neurological: Negative for dizziness and headaches.   Psychiatric/Behavioral: The patient does not have insomnia.      Physical Exam   Constitutional: She appears well-developed and well-nourished. No distress.   HENT:   Head: Normocephalic and atraumatic.   Eyes: Right eye exhibits no discharge. Left eye exhibits no discharge.   Neck: Neck supple. No JVD present. No thyromegaly present.   Cardiovascular: Normal rate, regular rhythm and normal heart sounds.  Exam reveals no gallop and no friction rub.    No murmur heard.  Pulmonary/Chest: Effort normal and breath sounds normal. No respiratory distress. She has no wheezes. She has no rales. She exhibits no tenderness.   Abdominal: Soft. Bowel sounds are normal. She exhibits no distension and no mass. There is no tenderness. There is no rebound and no guarding.   Musculoskeletal: She exhibits no edema and no tenderness.   No TTP at vert/paraspinal muscles, greater trochanters or knees b/l   Lymphadenopathy:     She has no cervical adenopathy.   Neurological: She is alert. Coordination normal.   Skin: Skin is warm and dry. She is  not diaphoretic. There is erythema.   approx 1cm2 lump at lateral dorsum of R foot with overlying erythema, no TTP, significantly mobile   Psychiatric: Her behavior is normal.          02/25/13  1834   BP: 120/76   Pulse: 80   Temp: 97.3 F (36.3 C)   TempSrc: Temporal   Height: 5' 1.5" (1.562 m)   Weight: 169 lb 8 oz (76.885 kg)   SpO2: 100%     (V70.0) Physical exam, routine  (primary encounter diagnosis)  Comment: obese female  Plan: HEALTH CARE PROXY        Next CPE >02/25/14    (782.2) Lump of skin of lower extremity, right foot  Comment: benign appearing but enlarging erythematous lump at dorsum - unclear etiology  Plan: REFERRAL TO PODIATRY ( INT)        For evaluation with bx    (793.19) Abnormal CXR (chest x-ray)  Comment: with concern for hilar lymphadenopathy  Plan: RADIOLOGIC EXAM CHEST 2 VIEWS FRONTAL&LATERAL        Rechecking s/p treatment for PNA    (530.81) Esophageal reflux  Comment: chronic, severe with reflux esophagitis; continued symptoms with PO triggers and without; partially controlled with Ranitidine use  Plan: restart omeprazole (PRILOSEC) 20 MG capsule, REFERRAL         TO GASTROENTEROLOGY ( INT) for f/u as pt DNKA'd previously scheduled GI f/u after EGD; counseled to avoid food triggers and Ibuprofen    (785.1) Rapid Noctural Palpitations x 10 Seconds - SVT  Comment: prev noted and treated with BB; pt d/c'd Diltiazem due to perceived SEs that may have been onset of PNA; no recent symptoms  Plan: f/u c cardiology; continue current meds    (268.9) Vitamin D deficiency  Comment: prev noted, on supplement  Plan: cholecalciferol (VITAMIN D3) 2000 UNIT tablet        Continue current supplement    (789.06) Abdominal pain, epigastric  Comment: with know reflux esophagitis on EGD earlier this year; inadequate sxs control on current meds  Plan: restart PPI daily and f/u c GI; f/u c PCP prn    (719.46) Right knee pain  Comment: concern for meniscal injury/damage with pain with rotation  Plan:  REFERRAL TO  ORTHOPEDICS ( INT) for evaluation; counseled re need for wt loss    (627.2) Menopausal state  Comment: prev on calcium and D  Plan: Calcium Carbonate-Vitamin D (CALCIUM 600+D)         600-400 MG-UNIT TABS Tablet        Counseled re restart    (786.07) Wheezing  Comment: since recent PNA episode; unclear if associated with active GERD sxs; is associated with throat clearing some mild voice loss - concern for possible asthma 2/2 to GERD  Plan: will trial PPI as mechanism to treat wheezing; if not improved, will trial Albuterol MDI    I have reviewed the past medical, surgical, social and family history and updated these sections of EpicCare as relevant. All interim labs, test results, and consult notes were reviewed and discussed with Jerilynn Mages. Medications were reconciled during this visit and a current medication list was given to the patient at the end of the visit.    F/u c PCP prn and as directed

## 2013-02-25 NOTE — Progress Notes (Signed)
Pt feels safe at home

## 2013-02-26 ENCOUNTER — Ambulatory Visit (HOSPITAL_BASED_OUTPATIENT_CLINIC_OR_DEPARTMENT_OTHER): Payer: Self-pay | Admitting: Internal Medicine

## 2013-02-26 ENCOUNTER — Telehealth (HOSPITAL_BASED_OUTPATIENT_CLINIC_OR_DEPARTMENT_OTHER): Payer: Self-pay | Admitting: Advanced Practice Midwife

## 2013-02-26 ENCOUNTER — Telehealth (HOSPITAL_BASED_OUTPATIENT_CLINIC_OR_DEPARTMENT_OTHER): Payer: Self-pay | Admitting: Internal Medicine

## 2013-02-26 ENCOUNTER — Encounter (HOSPITAL_BASED_OUTPATIENT_CLINIC_OR_DEPARTMENT_OTHER): Payer: Self-pay | Admitting: Internal Medicine

## 2013-02-26 DIAGNOSIS — R9389 Abnormal findings on diagnostic imaging of other specified body structures: Secondary | ICD-10-CM

## 2013-02-26 LAB — XR CHEST 2 VIEWS

## 2013-02-26 NOTE — Progress Notes (Signed)
Attempting to reach pt to review CXR results from earlier today. Advised that I am sending a MyChart message to her.

## 2013-02-26 NOTE — Progress Notes (Signed)
I spoke with patient, but she sad that she didn't call here for any appt also she is not pregnant. I apologize to patient.

## 2013-02-26 NOTE — Telephone Encounter (Signed)
Message copied by Simonne Martinet on Wed Feb 26, 2013 10:30 AM  ------       Message from: Jeryl Columbia       Created: Wed Feb 26, 2013  9:48 AM       Regarding: initial prenatal       Contact: [phone]         Patient would like to schedule an initial OB visit with Leonie Douglas  ------

## 2013-03-11 ENCOUNTER — Encounter (HOSPITAL_BASED_OUTPATIENT_CLINIC_OR_DEPARTMENT_OTHER): Payer: Self-pay | Admitting: Podiatrist

## 2013-03-11 ENCOUNTER — Ambulatory Visit (HOSPITAL_BASED_OUTPATIENT_CLINIC_OR_DEPARTMENT_OTHER): Payer: PRIVATE HEALTH INSURANCE | Admitting: Podiatrist

## 2013-03-11 VITALS — BP 126/75 | HR 74 | Temp 97.9°F

## 2013-03-11 DIAGNOSIS — L989 Disorder of the skin and subcutaneous tissue, unspecified: Secondary | ICD-10-CM

## 2013-03-11 NOTE — Progress Notes (Signed)
Patient tolerated punch biopsy of right foot lesion procedure well. VSS. No c/o pain. No evidence of bleeding. Site-care instructions reviewed with Pt. who has good understanding.

## 2013-03-11 NOTE — Progress Notes (Signed)
HPI:    Deborah [name] is a 47 year old female who presents with the chief complaint of a skin lesion present to the R foot. Has been present for ~ 14 months. Presentation did not coincide with trauma, travel or insect bites. No other rashes or lesions elsewhere. Hasn't occurred before. Can get red and enlarge but size will fluctuance. Can be bothersome but varies day by day. No drainage. No other complaints today.    ROS: Pertinent positives per HPI above.    LOWER EXTREMITY PHYSICAL EXAM RIGHT:    AAOx3. NAD. Pleasant  Dorsalis pedis + posterior tibial pulses are palpable; brisk capillary refill time; light touch is grossly intact plantar and dorsal foot. There is an elevated/nodular erythematous skin lesion present to the dorsolateral midfoot. No lichenification. No loss of relaxed skin tension lines. No variegation. No ulceration or bleeding. No pigmentation. Lesion has well defined borders. There is some central scaling noted.    ASSESSMENT/PLAN:  This is a 47 year old female with a R skin lesion. Likely represents a dermatofibroma. Biopsy was recommended to the patient to confirm diagnosis. Consent was obtained using telephone interpreter. Risks include pain, infection, recurrence. Site was blocked using 2 cc of 2% lidocaine plain. 3-mm punch was obtained and will be sent to pathology for examination. Site was dressing with bacitracin + band aid. Instructed to change bandaid daily. Patient will follow up in 1 week for biopsy results.

## 2013-03-12 ENCOUNTER — Encounter (HOSPITAL_BASED_OUTPATIENT_CLINIC_OR_DEPARTMENT_OTHER): Payer: Self-pay | Admitting: Podiatrist

## 2013-03-12 NOTE — Progress Notes (Signed)
This office note has been dictated. Account number [id number]

## 2013-03-12 NOTE — Progress Notes (Signed)
Date of Service: 03/11/2013    INITIAL EXAMINATION    CHIEF COMPLAINT:  Skin lesion, right foot.    HISTORY OF PRESENT ILLNESS:  This 47 year old female presents with an identified painful skin lesion to the dorsolateral aspect of her right foot.  This is present for approximately 14 months.  She cannot identify any precipitating factors with no history of trauma, penetrating injury.  She denies any other skin rashes or lesions.  She has appreciated some fluctuation in size as well as local erythema.  She does state some local tenderness with day-to-day activity.  She has had no prior formal podiatric medical care.    PAST MEDICAL HISTORY:  Carpal tunnel syndrome; vitamin D deficiency; SVT; degenerative disk disease, lumbar spine; obesity; reflux esophagitis; tobacco misuse, in remission.    ALLERGIES:  IBUPROFEN, POLLEN EXTRACT, TREE EXTRACT.    CURRENT MEDICATIONS:  1.  Omeprazole.  2.  Metoprolol.  3.  Cholecalciferol.  4.  Calcium carbonate, vitamin D.    PAST SURGICAL HISTORY:  1.  Breast biopsy.  2.  Septoplasty.  3.  C-section delivery.    SOCIAL HISTORY:  The patient is divorced and employed in housecleaning.  She is a former smoker of 13.5 pack years.  She denies alcohol or illicit drug use.    FAMILY HISTORY:  Mother hypertension, diabetes.  Father hypertension.  Maternal uncle diabetes, heart disease.  Maternal grandmother heart disease.    REVIEW OF SYSTEMS:  The patient denies constitutional symptoms.  All other systems are negative other than presenting complaints.    PHYSICAL EXAMINATION:  GENERAL:  This is an alert, oriented x25, 47 year old [place] female who is seen today with _____ telephone interpreter, but her English is adequate.  She presents in no acute distress.  She is of good affect.  She is moderately overweight.    DISTAL EXTREMITY EVALUATION:  VASCULAR:  The patient does have well-palpated distal pulses bilateral.  Skin temperature is within normal.  There is no  evidence of peripheral pitting edema.  NEUROLOGIC:  Deep tendon reflexes, patellar and ankle, are equal and symmetrical.  There is no sensory deficit to either foot or ankle.  There is no motor disturbance.  I cannot produce any Tinel's with percussion over the identified skin lesion of the right foot.  MUSCULOSKELETAL:  I appreciate no gross skeletal derangement to either foot or ankle.  There is no constraint of foot or ankle motion.  Muscle mass, strength, and tone is without asymmetry within normal limits.  DERMATOLOGIC:  There is a firm, raised, freely movable skin lesion to the dorsolateral aspect of the right foot.  It measures less than 10 mm in diameter.  It is erythematous and contained only to the lesion itself and not surrounding skin.  Superficially, there is keratitis.  There does not appear to be any alteration in skin coloration.  It is well defined.  It is tender with direct palpation.    ASSESSMENT:  Skin lesion, dorsolateral aspect, right foot, uncertain behavior, with differential diagnosis to include dermatofibroma, verrucoid lesion, carcinoma.    PLAN:  At this point, given the uncertain nature of this lesion we have recommended incisional punch biopsy.  The peri-operative course was thoroughly discussed.  Risks and benefits were outlined.  All questions were answered.  The patient is in agreement.  A surgical consent was signed.  Timeout was performed.  Under local infiltrative field block utilizing 2% Xylocaine plain, a 3 mm punch was utilized to create an  incisional biopsy.  This has been sent for histopathology.  Wound site was dressed with bacitracin ointment and clean bandage.  She tolerated the procedure well.  She will return in 1 week to discuss biopsy results.  She has been instructed on local care to include daily bathing with antibacterial soap and water and application of an adhesive Band-Aid.  The patient was seen with third-year surgical  resident.    ___________________________  Reviewed and Electronically Signed By: Rochele Pages DPM  Sig Date: 03/12/2013  Sig Time: 10:59:31  Dictated By: Rochele Pages DPM  Dict Date: 03/12/2013 Dict Time: 08 48 AM    Dictation Date and Time:03/12/2013 08:48:27  Transcription Date and Time:03/12/2013 09:42:26  eScription Dictation id: [id number] Confirmation # :[id number]      cc: Gwenith Spitz MD

## 2013-03-14 ENCOUNTER — Telehealth (HOSPITAL_BASED_OUTPATIENT_CLINIC_OR_DEPARTMENT_OTHER): Payer: Self-pay | Admitting: Orthopaedic Surgery

## 2013-03-14 LAB — SURGICAL PATH SPECIMEN

## 2013-03-14 NOTE — Progress Notes (Signed)
Called pt to offer a sooner app - patient declined to be seen sooner and wanted to see the MD .[facility]

## 2013-03-18 ENCOUNTER — Encounter (HOSPITAL_BASED_OUTPATIENT_CLINIC_OR_DEPARTMENT_OTHER): Payer: Self-pay | Admitting: Podiatrist

## 2013-03-18 ENCOUNTER — Ambulatory Visit (HOSPITAL_BASED_OUTPATIENT_CLINIC_OR_DEPARTMENT_OTHER): Payer: PRIVATE HEALTH INSURANCE | Admitting: Podiatrist

## 2013-03-18 ENCOUNTER — Encounter (HOSPITAL_BASED_OUTPATIENT_CLINIC_OR_DEPARTMENT_OTHER): Payer: Self-pay | Admitting: Internal Medicine

## 2013-03-18 ENCOUNTER — Ambulatory Visit (HOSPITAL_BASED_OUTPATIENT_CLINIC_OR_DEPARTMENT_OTHER): Payer: PRIVATE HEALTH INSURANCE | Admitting: Internal Medicine

## 2013-03-18 VITALS — BP 94/60 | HR 88 | Ht 61.5 in | Wt 170.0 lb

## 2013-03-18 DIAGNOSIS — R002 Palpitations: Secondary | ICD-10-CM

## 2013-03-18 DIAGNOSIS — I471 Supraventricular tachycardia, unspecified: Secondary | ICD-10-CM

## 2013-03-18 DIAGNOSIS — D239 Other benign neoplasm of skin, unspecified: Secondary | ICD-10-CM

## 2013-03-18 DIAGNOSIS — Z48817 Encounter for surgical aftercare following surgery on the skin and subcutaneous tissue: Secondary | ICD-10-CM

## 2013-03-18 NOTE — Progress Notes (Signed)
This office note has been dictated. Account number [id number]

## 2013-03-18 NOTE — Progress Notes (Signed)
Subjective: Deborah [name] is a 47 year old female who returns after biopsy skin lesion to the dorsolateral aspect of the right foot. She relates to only minimal pain to the biopsy site.    ROS: Pt denies nausea, vomiting, fever, chills, SOB, CP, calf pain, and diarrhea.     Exam:  General: AAOx3, pleasant  Lower Extremity Exam  DP/ PT pulses are palpable +2/4. CFT < 3 seconds to digits 1-5. No edema or varicosities noted. Biopsy site is healing well without any local signs of infection such as erythema, edema, increased warmth, or purulence.  No pain to palpation of biopsy site. Epicritic sensation intact to plantar feet b/l. Patient able to move digits without pain.    Pathology: Dermatofibroma    A/P: 47 year old female with benign lesion to dorsal aspect of right foot. The surgical site will continue to heal. She will return to clinic as needed.

## 2013-03-18 NOTE — Progress Notes (Signed)
.  patient feels safe at home

## 2013-03-18 NOTE — Progress Notes (Signed)
Date of Service: 03/18/2013    PROGRESS UPDATE    SUBJECTIVE:  The patient is seen for followup regarding excisional punch biopsy skin lesion dorsolateral aspect right foot of uncertain behavior.  She relates doing well.  She does not comment on adverse response secondary to the procedure.    REVIEW OF SYSTEMS:  The patient denies constitutional symptoms.    PHYSICAL EXAMINATION:  GENERAL:  This is an alert, oriented times three, 47 year old [place] Brazilian-speaking female who does utilize English effectively for today's visit.  She presents in no acute distress.  She is of good affect.  DISTAL EXTREMITY EVALUATION:  Surgical wound site to the dorsolateral aspect of the right foot reveals uneventful healing.  There are no signs of infection.    TESTS:  Histopathology confirmed diagnosis of dermatofibroma.    ASSESSMENT:  Benign dermatofibroma of right foot.    PLAN:  I explained the benign nature of this lesion.  No formal care is warranted.  I explained that the surgical site should heal uneventfully.  She will follow as needed.  She was seen today with second year surgical resident.    ___________________________  Reviewed and Electronically Signed By: Rochele Pages DPM  Sig Date: 03/18/2013  Sig Time: 14:48:29  Dictated By: Rochele Pages DPM  Dict Date: 03/18/2013 Dict Time: 02 38 PM    Dictation Date and Time:03/18/2013 14:38:30  Transcription Date and Time:03/18/2013 14:45:41  eScription Dictation id: [id number] Confirmation # :[id number]      cc: Gwenith Spitz MD

## 2013-03-18 NOTE — Progress Notes (Signed)
This office note has been dictated. Account number [id number]

## 2013-03-19 NOTE — Progress Notes (Signed)
Date of Service: 03/18/2013    March 18, 2013    Gwenith Spitz, MD  [facility]  [address]  [place], [place]  [address]    RE:  Deborah [name]    Dear Cleone Slim:    I saw Ms. Tukes in the office on 09 December in followup of her prior visit with me of 5 months ago regarding SVT to 240 beats per minute 3 years ago.  She also has GERD and angioneurotic edema but no other significant medical issues.  She is the mother of a 49 year old son and works long hours as a Advertising copywriter.  Her meds today per EPIC include just metoprolol 50 and omeprazole 20 and supplements.  She has epigastric pain and maybe throat swelling with ibu.  Family history shows heart disease in maternal uncles and grandma.  She uses no substances and resides with her son and her parents.    She had a Holter a year ago which showed negligible ectopy, and she had a normal echo.  She was having palpitations lasting a few seconds every other week commonly awakening her, but we never saw it.  Two weeks before I saw her last she had rapid palpitations, onset at 0300 lasting 5 minutes with no nausea, diaphoresis, or dyspnea.  It was not relieved by Valsalva, but I coached her to use the Valsalva longer, and she did try, and I also explained gagging to her.  She had 2 of her episodes that day.  She had had some caffeine that day, and it could be a factor.    My physical exam was otherwise unremarkable.  I gave her diltiazem CD 120, and she is here to review the results.    In the 5 months she has had no symptoms at all.    It turns out that she took the metoprolol for a week and then developed weakness and fatigue.  She was seen by you, and you thought she had a pneumonia at the time, so it is not clear that the diltiazem was at fault, but it was withdrawn regardless, and she has done well without it.  Because she has not had any palpitations at all, which is really rather substantial change, I am happy as a clam to have her continue on just the Toprol  50, but if she gets recurrent symptoms we will retry the diltiazem.  Accordingly, I am not going to put it on her allergy list because it sounds like she had a major illness that was producing her symptoms rather than the agent.  Regardless, I do not want to give her a medicine she does not need, and if she is not having palpitations she will not use it, and if she does get recurrent palpitations, which she probably will, we will go back to diltiazem CD 120.    Today she fills out our 10-system ROS form and has no new complaints out of the 10 systems reviewed.    The vitals today show that she stands 5 feet 2 inches and weighs 170 pounds for a BMI of 32.  The heart rate is 88 and regular and the pressure 94/60, which is the lowest it ever been, but it has commonly been down to 102, but she is asymptomatic, so it does not matter.  Room air sat is 98%.  Ocular muscles are intact, and the tongue is midline with symmetric face and excellent dentition.  No palpable cervical nodes or thyroid and no  JVD.  The chest is clear.  The heart is regular without murmurs, clicks, or gallops.  The abdomen is soft without palpable organ, mass, or tenderness.  There is no rash, clubbing, cyanosis, or edema.  She is fully oriented and has appropriate affect.    My impression is that of supraventricular tachycardia to a very rapid rate which seems to be quiescent now on metoprolol 50.  It seemed to be acting out for awhile on that Toprol, but after 1 week of diltiazem it has been totally absent.  That is an unexpected benefit for transient diltiazem therapy, but I will accept it.  If it recurs, we will resort back to the diltiazem and see if she has that same constitutional symptom complex, which I think she will not.    It is a joy to see her doing well.  I want to see her again in 1 year but sooner if I can help.  Thank you for the privilege of participating in her care.    Sincerely,    ___________________________  Reviewed and  Electronically Signed By: Allegra Grana MD  Sig Date: 03/19/2013  Sig Time: 12:38:38  Dictated By: Allegra Grana MD  Dict Date: 03/18/2013 Dict Time: 05 33 PM    Dictation Date and Time:03/18/2013 17:33:38  Transcription Date and Time:03/19/2013 09:25:30  eScription Dictation id: [id number] Confirmation # :[id number]      cc: Gwenith Spitz MD

## 2013-03-25 ENCOUNTER — Ambulatory Visit (HOSPITAL_BASED_OUTPATIENT_CLINIC_OR_DEPARTMENT_OTHER): Payer: PRIVATE HEALTH INSURANCE | Admitting: Orthopaedic Surgery

## 2013-03-25 DIAGNOSIS — M25561 Pain in right knee: Secondary | ICD-10-CM

## 2013-03-25 LAB — XR KNEE RIGHT MINIMUM 4 VIEWS

## 2013-03-25 LAB — XR KNEES STANDING AP ONLY BILATERAL

## 2013-03-25 NOTE — Progress Notes (Signed)
The primary progress note for this visit has been dictated through E-Scription. It can be viewed as an attachment to this encounter or through Chart Review under the Notes Tab as an Orthopedic Office Note.      Review of Systems: Constitutional, Eyes, ENT/Mouth, Cardiovascular, Respiratory, GI, GU, Neuro, Psych, Heme/Lymph, Skin, Musculoskeletal was reviewed and is NEGATIVE except for what is dictated in the note.    MT ACCT #:  [id number]

## 2013-03-25 NOTE — Progress Notes (Signed)
Date of Service: 03/25/2013    ADDENDUM to the note of Lavonia Dana, PA-C.    I personally examined and spoke with Ms. Cecilio today, and I believe her diagnosis is either a fat pad syndrome or patellar subluxation.  She is going to be referred for physical therapy and given medication.    ___________________________  Reviewed and Electronically Signed By: Edwyna Ready MD  Sig Date: 03/26/2013  Sig Time: 07:32:51  Dictated By: Edwyna Ready MD  Dict Date: 03/25/2013 Dict Time: 04 12 PM    Dictation Date and Time:03/25/2013 16:12:37  Transcription Date and Time:03/25/2013 19:51:58  eScription Dictation id: [id number] Confirmation # :[id number]

## 2013-03-25 NOTE — Progress Notes (Signed)
Date of Service: 03/25/2013    HISTORY OF PRESENT ILLNESS:  Deborah [name] is a 47 year old [name] who comes to the orthopedic office today for further evaluation of her right knee pain.  She is being seen on request of her primary care physician.  A [place] interpreter is utilized for today's visit.  Her right knee pain has been present for several years and initially was episodic.  Now she is experiencing knee pain several times per week with all activities and movements, particularly going up and down stairs.  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  She notes the anterior knee pain mostly with twisting maneuvers or bending of the knees.  She feels as though something is coming out of place, and she localizes most of her pain to the anterior-inferior aspect of her kneecap, both medial and lateral.  She has been using Tylenol with some symptomatic relief.  She is unable to use anti-inflammatory medication.    PAST MEDICAL HISTORY:  Includes carpal tunnel syndrome, trigger finger, trochanteric bursitis, esophageal reflux and gastritis, edema, chondromalacia patella, lower back pain, intermittent spinal claudication, vitamin D deficiency, degenerative disk disease, obesity, reflux esophagitis, and wheezing.    MEDICATIONS:  Vitamin D3, Prilosec, calcium, and Toprol.     ALLERGIES:  Ibuprofen and pollen extract.    SOCIAL HISTORY:  Currently lives in [place] and works as a Land.    PHYSICAL EXAMINATION:  Deborah Faulkenberry is alert and oriented.  To inspection of her bilateral knees, she does not appear to have swelling.  To palpation, she does have minimal tenderness, both medial and lateral anterior joint lines adjacent to the kneecap.  She is nontender over the infrapatellar tendon.  She does have range of motion of the knee.  McMurray sign is negative.  She does have some discomfort with stressing of the kneecap.  No neurovascular deficit is  noted to the lower extremity.    DIAGNOSTIC STUDIES:  X-rays taken of her knee show no significant bony abnormality.    ASSESSMENT AND PLAN:  Deborah Simeone presents to the orthopedic office today with some right knee pain.  She may have some mild subluxation of her kneecap.  For this, we are suggesting physical therapy.  Order form has been provided.  She is unable to use anti-inflammatory medications and will continue with Tylenol.  She will follow up with the orthopedic office in approximately 6 weeks for further evaluation.      The patient was also interviewed and examined by Dr Cheron Every.       ___________________________  Reviewed and Electronically Signed By: Ronnald Collum PA-C  Sig Date: 05/20/2013  Sig Time: 09:12:38  Dictated By: Ronnald Collum PA-C  Dict Date: 03/25/2013 Dict Time: 05 43 PM    Dictation Date and Time:03/25/2013 17:43:32  Transcription Date and Time:03/25/2013 20:46:20  eScription Dictation id: [id number] Confirmation # :[id number]      cc: Gwenith Spitz MD

## 2013-04-26 ENCOUNTER — Encounter (HOSPITAL_BASED_OUTPATIENT_CLINIC_OR_DEPARTMENT_OTHER): Payer: Self-pay

## 2013-04-26 ENCOUNTER — Emergency Department (HOSPITAL_BASED_OUTPATIENT_CLINIC_OR_DEPARTMENT_OTHER)
Admission: RE | Admit: 2013-04-26 | Disposition: A | Discharge status: Home / Self Care | Payer: Self-pay | Source: Emergency Department | Attending: Emergency Medicine | Admitting: Emergency Medicine

## 2013-04-26 LAB — XR CHEST 2 VIEWS

## 2013-04-26 MED ORDER — ALBUTEROL SULFATE HFA 108 (90 BASE) MCG/ACT IN AERS
2.0000 | INHALATION_SPRAY | RESPIRATORY_TRACT | Status: DC
Start: 2013-04-26 — End: 2014-06-30

## 2013-04-26 MED ORDER — ALBUTEROL SULFATE HFA 108 (90 BASE) MCG/ACT IN AERS
2.00 | INHALATION_SPRAY | Freq: Once | RESPIRATORY_TRACT | Status: AC
Start: 2013-04-26 — End: 2013-04-26
  Administered 2013-04-26: 2 via RESPIRATORY_TRACT
  Filled 2013-04-26: qty 8

## 2013-04-26 MED ORDER — ACETAMINOPHEN-CODEINE #3 300-30 MG PO TABS
2.00 | ORAL_TABLET | Freq: Once | ORAL | Status: AC
Start: 2013-04-26 — End: 2013-04-26
  Administered 2013-04-26: 2 via ORAL
  Filled 2013-04-26: qty 2

## 2013-04-26 MED ORDER — GUAIFENESIN-CODEINE 100-10 MG/5ML PO SOLN
5.00 mL | Freq: Three times a day (TID) | ORAL | Status: AC | PRN
Start: 2013-04-26 — End: 2013-05-26

## 2013-04-26 MED ORDER — ACETAMINOPHEN 325 MG PO TABS
650.00 mg | ORAL_TABLET | Freq: Four times a day (QID) | ORAL | Status: AC
Start: 2013-04-26 — End: 2013-05-03

## 2013-04-26 MED ORDER — ACETAMINOPHEN 500 MG PO TABS
1000.0000 mg | ORAL_TABLET | Freq: Once | ORAL | Status: DC
Start: 2013-04-26 — End: 2013-04-26

## 2013-04-26 NOTE — ED Triage Note (Signed)
Pt ambulatory to ED  Alert  C/o Fever, cough, muscle aches  Pt states has been sick since christmas  Taking Advil and tylenol at home   productive cough with small amount of green phlegm.   Denies SOB  Temperature last night was 103 as per pt

## 2013-04-26 NOTE — ED Notes (Signed)
Patient Disposition    Patient education for diagnosis, medications, activity, diet and follow-up.  Patient left ED 12:15 PM.  Patient rep received written instructions.  Interpreter to provide instructions: No    Discharged to: Discharged to home

## 2013-04-26 NOTE — Discharge Instructions (Signed)

## 2013-04-26 NOTE — ED Notes (Signed)
Patient Disposition    Patient education for diagnosis, medications, activity, diet and follow-up.  Patient left ED 12:25 PM.  Patient rep received written instructions.  Interpreter to provide instructions: No    Discharged to: Discharged to home

## 2013-04-27 NOTE — ED Provider Notes (Signed)
eMERGENCY dEPARTMENT eNCOUnter    CHIEF COMPLAINT    Patient presents with:    Cough - FLU LIKE SYMPTOMS-ID    Fever      HPI    Deborah [name] is a 48 year old female who presents with fever, cough, generalized weakness, and achiness in the joints, and headache.  The patient has been sick since before Christmas.  No nausea, vomiting, diarrhea, chest pain, or shortness of breath is reported.  Patient reports that she has taken people over-the-counter medications already.  She states that she has taken Motrin, Tylenol, TheraFlu, and Delsym.  04/26/2013 10:56 AM.    PAST MEDICAL HISTORY      Past Medical History    Irregular menstrual cycle     Pregnant state, incidental     Comment: c/s breech    Esophageal reflux     SVT (supraventricular tachycardia) 3/10    Comment: [facility]       SURGICAL HISTORY        Past Surgical History    [place] BREAST BIOPSY SPECIMEN      Comment s/p fibroadenoma on the L,     SEPTOPLASTY/SUBMUCOUS RESECJ W/WO CARTILAGE GRF      CESAREAN DELIVERY ONLY      Comment breech       CURRENT MEDICATIONS    1. guaiFENesin-codeine (CHERATUSSIN AC) 100-10 MG/5ML liquid  Route: Oral NGE:XBMW 5 mLs by mouth 3 (three) times daily as needed for Cough.  Dispense: 120 mL Refill: 0    2. acetaminophen (TYLENOL) 325 MG tablet  Route: Oral UXL:KGMW 2 tablets by mouth 4 (four) times daily.  Dispense: 30 tablet Refill: 0    3. albuterol (PROAIR HFA) 108 (90 BASE) MCG/ACT inhaler  Route: Inhalation NUU:VOZDGU 2 puffs into the lungs See Admin Instructions. 1-2 puffs every 6 hours while awake on as scheduled basis for 3 days, and thereafter as needed. Always use together with aerochamber spacing device.  Dispense: 1 Inhaler Refill: 0    4. cholecalciferol (VITAMIN D3) 2000 UNIT tablet  Route: Oral YQI:HKVQ 1 tablet by mouth daily.  Dispense: 30 tablet Refill: 11    5. omeprazole (PRILOSEC) 20 MG capsule  Route: Oral QVZ:DGLO 1 capsule by mouth daily. Pt states OTC and not sure of dose.  Dispense: 30 capsule Refill:  11    6. Calcium Carbonate-Vitamin D (CALCIUM 600+D) 600-400 MG-UNIT TABS Tablet  Route: Oral VFI:EPPI 1 tablet by mouth 2 (two) times daily.  Dispense: 60 tablet Refill: 11    7. metoprolol (TOPROL XL) 50 MG 24 hr tablet  Route: Oral RJJ:OACZ 1 tablet by mouth daily.  Dispense: 30 tablet Refill: 11      ALLERGIES    Review of Patient's Allergies indicates:   Ibuprofen               Other (See Comments)    Comment:Epigastric pain, throat swelling?             05/06/12 pt states uses sometimes without issue   Pollen extract-tree*    Runny Nose    Comment:HA, itchy watery eyes    FAMILY HISTORY      Family History    Hypertension Mother     Hypertension Father     Diabetes Mother     Diabetes Maternal Uncle     Diabetes Maternal Uncle     Cancer - Other FamHxNeg     Cancer - Breast FamHxNeg     Cancer - Colon FamHxNeg  Cancer - Lung FamHxNeg     Cancer - Ovarian FamHxNeg     Heart Maternal Grandmother     Heart Maternal Uncle     Heart Maternal Uncle        SOCIAL HISTORY    Social History    Marital Status: Divorced            Spouse Name:                       Years of Education:                 Number of children: 1             Occupational History  Occupation          Data processing manager                               Social History Main Topics    Smoking Status: Former Smoker                   Packs/Day: 0.50  Years: 27        Types: Cigarettes      Quit date: 06/28/2002    Smokeless Status: Never Used                        Alcohol Use: No              Drug Use: No              Sexual Activity: Not Currently     Partners with: Female       Comment: no STI hx; HIV neg 2002    Other Topics            Concern  Stress Concern          Yes    Comment:rev  Weight Concern          Yes    Comment:rev  Special Diet            Yes    Comment:rev  Exercise                No  Seat Belt               Yes    Comment:rev  Self-Exams              Yes    Comment:rev    Social History  Narrative    [place], [place]. To [place] 2000.    Lives with her son and her parents. Divorced from husband.    Denies DV. No guns in home.        REVIEW OF SYSTEMS    All other systems negative except as marked.     PHYSICAL EXAM      Vital Signs: BP 133/76   Pulse 103   Temp(Src) 100.7 F   Resp 16   Wt 74.844 kg   BMI 30.68 kg/m2   SpO2 98%   LMP 10/17/2006      Constitutional: Appears fatigued    HENT:  Normocephalic, Atraumatic    Neck: Normal range of motion.    Respiratory:  Clear to auscultation.     Cardiovascular:  Tachycardic    Abdominal:  Bowel sounds normal.  S/NT/ND.      Musculoskeletal:  Good range of motion in all joints    Skin:  Warm, Dry    Neurological:  No focal deficits noted.   Moving all extremities.     Psych: No Homicidal or Suicidal Ideation      PROCEDURES  None      ED COURSE & MEDICAL DECISION MAKING    Pertinent Labs & Imaging studies reviewed.     48 year old female who presents with multiple symptoms.  Most notably, the patient has had a fever and a productive cough.    Chest x-ray was obtained shortly after arrival.  By my read, it shows no acute infiltrate.    Patient is nontoxic.  She appears to have a viral illness.  Her son is also affected by this illness.     Patient was treated supportively with Cheratussin a.c., Tylenol, and proair.    Bronchitis instructions were provided.    CONDITION:  Stable and improved    DISPOSITION  Home    FINAL IMPRESSION  Bronchitis      FOLLOW UP PLAN:  Primary care as needed        Liam Graham, MD  Emergency Room Attending Physician  [facility]  Associate Director of Medical Toxicology  Instructor in [place]

## 2013-04-28 ENCOUNTER — Telehealth (HOSPITAL_BASED_OUTPATIENT_CLINIC_OR_DEPARTMENT_OTHER): Payer: Self-pay | Admitting: Registered Nurse

## 2013-04-28 NOTE — Progress Notes (Signed)
Morey Hummingbird, RN, 04/28/2013, 5:32 PM    RN outreach call to patient who was seen at emergency room     Un able to reach / speak with patient     Was call made with the assistance of an interpreter? yes , Language: port    Update:    Was an ER follow up appointment scheduled for patient?  No left message /letter sent      I left a vm for Marlou Sa to return call to clinic with questions and or concerns or to schedule a follow up appointment with Malva Limes, MD regarding recent ED visit

## 2013-04-29 ENCOUNTER — Ambulatory Visit (HOSPITAL_BASED_OUTPATIENT_CLINIC_OR_DEPARTMENT_OTHER): Payer: PRIVATE HEALTH INSURANCE

## 2013-04-29 DIAGNOSIS — M25562 Pain in left knee: Principal | ICD-10-CM

## 2013-04-29 DIAGNOSIS — M25561 Pain in right knee: Secondary | ICD-10-CM

## 2013-04-29 DIAGNOSIS — G8929 Other chronic pain: Secondary | ICD-10-CM | POA: Insufficient documentation

## 2013-04-29 NOTE — Progress Notes (Signed)
OUTPATIENT EVALUATION    Referring Provider: Duayne Cal  [address], [place] [address]      Precautions: lumbar DDD, obesity     SUBJECTIVE  Hx of Present Illness: Pt is a 48 year old female who presents to physical therapy with a physician diagnosis of knee pain R>L. She reports that sometimes when she stands up, she feels like something moves in her knee. She reports that she feels like the knee may give out and that she is unable to stand any longer, however she has not experienced any buckling or falls. She reports that this has been going on for about 8 months with insidious onset and now worsening symptoms. Pain is superomedial and inferiolateral to patella.     Onset Date: 08/08/12  Onset Code: 05  ICD-9 Code: 719.46 (bilateral knee pain)    Imaging: Exam Date: 03/25/13 Exam Status: Signed     Exam: KNEE, RIGHT MIN 4 VIEWS; KNEES, BILAT STANDING ONLY   Reason for Exam: right knee pain     Technique: Four views of the right knee; standing radiograph of both   knees     Comparison: None available     Findings:     Right knee: The alignment is in tact. There is no acute fracture. Tiny   marginal osteophytes are noted along the medial tibiofemoral cartilage   space. The cartilage spaces are preserved. There is no significant   joint effusion. The soft tissues are unremarkable.     Standing radiograph of both knees: The alignment is intact. The   cartilage spaces are preserved. There is no acute fracture.     Impression:   1. Tiny marginal osteophytes along the right medial tibiofemoral   compartment.   2. Normal alignment and preservation of the cartilage spaces.         SUBJECTIVE    Prior Level of Function: no limitations    Pain Level: 0/10  currently    IADL'S/Work: IMPAIRED: works as Engineer, building services, pain with standing up from squatting position, pain and crepitus with ascending stairs     Dressing/Grooming: IMPAIRED: difficulty to put sock on because unable to lift leg up     Driving/sitting  tolerance: IMPAIRED: unable to cross leg     Sleeping: WNL    Aggravating Factors: squatting, stairs, sit to stand     Alleviating Factors: bengay, pain patch     Past Medical History:     Past Medical History    Irregular menstrual cycle     Pregnant state, incidental     Comment: c/s breech    Esophageal reflux     SVT (supraventricular tachycardia) 3/10    Comment: [place]     Deborah Active Problem List:     LUMP OR MASS IN BREAST     ESOPHAGEAL REFLUX     GASTRITIS NEC W/O HEMORRH     Abdominal Pain, Epigastric     ANGIONEUROTIC EDEMA     Plantar Fasciitis, L     Leg Pain     Tobacco abuse, in remission     Triggering of Finger, R 3rd     Right subacromial bursitis/rotator cuff tendinopathy     Chondromalacia Patellae     Lower back pain     De Quervain's Tenosynovitis, R     CTS (Carpal Tunnel Syndrome), b/l     Family history of diabetes mellitus (DM)     Hypolipoproteinemia     Intermittent Spinal Claudication  Vitamin D deficiency     Greater Trochanteric Bursitis, b/l     DDD (Degenerative Disc Disease), Lumbar     Obesity     SVT To 240 BPM March 2010     Palpitations     Rapid Noctural Palpitations x 10 Seconds     Sweating     Microscopic hematuria     Reflux esophagitis     Lump of skin of lower extremity     Wheezing        Medications: (Rx Comments, concerns): For a list of current medications review the Medication activity.   Mental Status/Communication: WNL    Learns Best: demonstration       Objective:   04/29/13 1500   Language Information   Language of Care English   Evaluation Type   Evaluation Type Initial Evaluation   Rehab Discipline   Rehab Discipline PT   Visit   Visit number 1   POC Due date 05/30/13   Pain   Currently in Pain No/denies   Pain Score 0    Weight Bearing Status   RLE WBAT   LLE WBAT   PROM LLE (degrees)   L Hip Flexion 0-125 100 Degrees   L Hip Extension 0-30 10 Degrees   L Hip Internal Rotation 0-35 25 Degrees   L Hip External Rotation 0-45 50 Degrees   L Knee Flexion 0-140  125 Degrees   L Knee Extension 0-130 5 Degrees   Strength LLE   L Hip Flexion 4+/5   L Hip Extension 4+/5   L Hip ABduction 4/5   L Knee Flexion 5/5   L Knee Extension 5/5   L Ankle Dorsiflexion 5/5   L Ankle Plantar Flexion 5/5   LLE Muscle Length Assessment   Muscle length (comment) limited HS and ITB length   PROM RLE (degrees)   R Hip Flexion 0-125 100 Degrees   R Hip Extension 0-30 10 Degrees   R Hip Internal Rotation 0-35 25 Degrees   R Hip External Rotation 0-45 55 Degrees   R Knee Flexion 0-140 125 Degrees   R Knee Extension 0-130 5 Degrees   Strength RLE   R Hip Flexion 3+/5   R Hip Extension 3-/5   R Hip ABduction 3-/5   R Knee Flexion 4/5   R Knee Extension 4-/5   R Ankle Dorsiflexion 5/5   R Ankle Plantar Flexion 5/5   RLE Muscle Length Assessment   Muscle length (comment) limited HS and ITB length   Left Knee results   L Anterior Drawer  Negative   L Lachman's  Negative   L Posterior Drawer  Negative   L Varus Stress (min increased laxity )   L Valgus Stress  (min increased laxity)   L McMurray's Negative   L Bounce Negative   L Clarke's Negative   Right Knee results   R Anterior Drawer Negative   R Lachman's Negative   R Posterior Drawer Negative   R Varus Stress (min increased laxity)   R Valgus Stress  (min increased laxity)   R McMurray's  Negative   R Bounce  Negative   R Clarke's  Positive   Gait   Gait (WNL)   Sensation   Light Touch No apparent deficits   Coordination   Gross Motor Performance Observation WFL   Functional Activities/Ergonomics   Squatting Impaired  (unable to squat past 60 degrees)   Single Limb Stance Impaired  (decreased on R)  Other (comments) SL squat- impaired, valgus knee movement on R   Palpation   Tenderness to Palpation B knees medial and lateral joint lines   Increased Tissue Density R ITB distally   Other crepitus R>L with knee flexion   Right Knee Patellofemoral   R Patellofemoral Superior 3/6   R Patellofemoral inferior 3/6   R Patellofemoral medial 4/6   R  Patellofemoral Lateral 4/6   Right Knee Tibiofemoral   R Tibiofemoral Anterior 3/6   R Tibiofemoral Posterior 3/6   Left Knee Patellofemoral   L Patellofemoral Superior 3/6   L Patellofemoral Inferior 3/6   L Patellofemoral Medial 4/6   L Patellofemoral Lateral 4/6   Left Knee Tibiofemoral   L Tibiofemoral Anterior 3/6   L Tibiofemoral Posterior 3/6   Deborah Education   What was taught? role of PT, PT goals, POC; HEP   Method Verbal;Practice;Written   Deborah comprehension Yes   SLR: 5 degree quad lag on R  Bridging: able to perform full bridge     FUNCTIONAL DEFICITS: work related activities, squatting, stair negotiation, sit to stand transfers      Physical Therapy Plan of Care    [id number] Deborah Cox, MD  Referring Provider: Duayne Cal  Diagnosis: knee pain    Assessment/Objective Findings: Deborah is a 48 year old female with complaints of pain in R>L knee.  The following PT problems were noted upon evaluation: decreased RLE strength, decreased B hip and core strength, limited HS and ITB length B, impaired squat mechanics, impaired SL balance on R. These problems limit the Deborah with the following functional activities: work related activities, sit to stand transfers, stair negotiation, squatting. This Deborah will benefit from skilled PT plan of care. The prescribed treatment plan of care is medically necessary secondary to knee pain and to return Deborah to prior level of function.  Co-morbidities of DDD of lumbar spine and obesity were identified and taken into considerations of plan of care.    Short Term Functional Goals: 4 weeks.   Pt will demonstrate independence and compliance with HEP in 4 weeks  Pt will demonstrate improved strength of R quad as evidenced by ability to perform SLR without quad lag x 10 in 4 weeks  Pt will demonstrate improved B hip strength by 1/2 grade in 4 weeks  Pt will demonstrate improved SLS of R to > 10 sec in 4 weeks   Pt will demonstrate improved SL squat mechanics on  R with proper knee alignment to 60 degrees in 4 weeks   Pt will demonstrate improved length of B HS to normal length in 4 weeks     Long Term Goal: 8 weeks.   Pt will demonstrate ability to perform all work related activities without symptoms  Pt will demonstrate ability to ascend 8 steps reciprocally without pain    Treatment Plan: ** Stretching/ROM Exercise  ** Therapeutic Exercise  ** Home Exercise Program  ** Joint Mobilization  ** Soft Tissue Mobilization  ** Hot/Cold Rx  ** Functional Activities  ** Deborah Education    Recommend Physical Therapy be continued 2 times per week for 4 weeks.  The rehabilitation potential for this Deborah is excellent    Deborah [name] is aware of attendance policy: Yes  Plan of care discussed with Deborah/Family: Yes  Deborah goals reviewed and incorporated in plan of care: Yes  Deborah/Family agrees with plan of care: Yes  Deborah/Family education: Yes  Does Deborah feel safe at home: Yes  Antony Contras, PT

## 2013-04-30 ENCOUNTER — Ambulatory Visit (HOSPITAL_BASED_OUTPATIENT_CLINIC_OR_DEPARTMENT_OTHER): Payer: PRIVATE HEALTH INSURANCE | Admitting: Gastroenterology

## 2013-05-05 NOTE — Progress Notes (Signed)
I certify that the documented Treatment Plan is reasonable and necessary.    05/05/2013  Hinata Diener, PA-C

## 2013-05-09 ENCOUNTER — Ambulatory Visit (HOSPITAL_BASED_OUTPATIENT_CLINIC_OR_DEPARTMENT_OTHER): Payer: PRIVATE HEALTH INSURANCE

## 2013-05-12 ENCOUNTER — Ambulatory Visit (HOSPITAL_BASED_OUTPATIENT_CLINIC_OR_DEPARTMENT_OTHER): Payer: PRIVATE HEALTH INSURANCE

## 2013-05-20 ENCOUNTER — Ambulatory Visit (HOSPITAL_BASED_OUTPATIENT_CLINIC_OR_DEPARTMENT_OTHER): Payer: PRIVATE HEALTH INSURANCE

## 2013-05-20 DIAGNOSIS — M25561 Pain in right knee: Secondary | ICD-10-CM

## 2013-05-20 DIAGNOSIS — M25562 Pain in left knee: Principal | ICD-10-CM

## 2013-05-20 NOTE — Progress Notes (Signed)
05/20/13 [facility]   Visit   Visit number 2   Time Calculation   Start Time 1530   Stop Time 1600   Time Calculation (min) 30 min   Pain   Pain Score 0    Ther Exercise   Exercise recumbent bike x 5 min level 1   Ther Exercise 2   Exercise SLR    Reps 2 10   Sets 2 2   Ther Exercise 3   Exercise 3 bridging with RTB above knees   Reps 3 10   Sets 3 2   Ther Exercise 4   Exercise 4 sidelying clam shells RTB   Reps 4 10   Sets 4 2   Ther Exercise 5   Exercise 5 sit to stand    Reps 5 10   Ther Exercise 6   Exercise 6 step up 6 in   Reps 6 10   Sets 6 2   Ther Exercise 7   Exercise 7 SLS clocks x5 ea   Patient Education   What was taught? reviewed HEP   Method Verbal;Practice   Patient comprehension Yes

## 2013-05-20 NOTE — Progress Notes (Signed)
S: Patient reports that she feels better but that over the past two days she has been having some pain in the right knee because of the icy sidewalks.   O: Refer to Rehabilitation Treatment Flowsheet  A: Patient with improved SLR on R without quad lag. She dem good tolerance to activity. Patient with increased pain initially with step up on R which improved with cues for increased weightbearing through heel.   P: Continue progressing LE strength

## 2013-05-26 ENCOUNTER — Ambulatory Visit (HOSPITAL_BASED_OUTPATIENT_CLINIC_OR_DEPARTMENT_OTHER): Payer: PRIVATE HEALTH INSURANCE | Admitting: Orthopaedic Surgery

## 2013-05-27 ENCOUNTER — Ambulatory Visit (HOSPITAL_BASED_OUTPATIENT_CLINIC_OR_DEPARTMENT_OTHER): Payer: PRIVATE HEALTH INSURANCE | Admitting: Orthopaedic Surgery

## 2013-05-27 ENCOUNTER — Ambulatory Visit (HOSPITAL_BASED_OUTPATIENT_CLINIC_OR_DEPARTMENT_OTHER): Payer: PRIVATE HEALTH INSURANCE

## 2013-06-02 ENCOUNTER — Ambulatory Visit (HOSPITAL_BASED_OUTPATIENT_CLINIC_OR_DEPARTMENT_OTHER): Payer: PRIVATE HEALTH INSURANCE

## 2013-06-09 ENCOUNTER — Ambulatory Visit (HOSPITAL_BASED_OUTPATIENT_CLINIC_OR_DEPARTMENT_OTHER): Payer: PRIVATE HEALTH INSURANCE

## 2013-06-09 DIAGNOSIS — M25562 Pain in left knee: Principal | ICD-10-CM

## 2013-06-09 DIAGNOSIS — M25561 Pain in right knee: Secondary | ICD-10-CM

## 2013-06-09 NOTE — Progress Notes (Signed)
06/09/13 [facility]   Visit   Visit number 3   Time Calculation   Start Time 0321   Stop Time 1600   Time Calculation (min) 30 min   Pain   Pain Score 0    Ther Exercise   Exercise upright bike x 5 min   Ther Exercise 2   Exercise SLR   Reps 2 10   Sets 2 2   Ther Exercise 3   Exercise 3 sidelying hip abd   Reps 3 10   Sets 3 2   Ther Exercise 4   Exercise 4 bridging with marches    Reps 4 4   Sets 4 4   Ther Exercise 5   Exercise 5 sit to stand   Reps 5 10   Sets 5 2   Ther Exercise 6   Exercise 6 step up ant and lateral   Reps 6 10   Sets 6 2   Ther Exercise 7   Exercise 7 standing hip abd    Reps 7 10   Sets 7 2   Ther Exercise 8   Exercise 8 wobbleboard balance AP/ML x 2 min ea   Patient Education   What was taught? updated HEP   Method Verbal;Practice   Patient comprehension Yes

## 2013-06-09 NOTE — Progress Notes (Signed)
S: Patient reports that she has had less frequent knee buckling. She reports no pain  O: Refer to Rehabilitation Treatment Flowsheet  A: Patient with improved quad control with SLR today. She had some minor medial knee pain with sit to stand but no pain throughout other quad strengthening exercises. Patient challenged by wobbleboard secondary to decreased balance and hip stability  P: Continue progressing quad and hip strengthening

## 2013-06-17 ENCOUNTER — Ambulatory Visit (HOSPITAL_BASED_OUTPATIENT_CLINIC_OR_DEPARTMENT_OTHER): Payer: PRIVATE HEALTH INSURANCE | Admitting: Orthopaedic Surgery

## 2013-06-17 ENCOUNTER — Ambulatory Visit (HOSPITAL_BASED_OUTPATIENT_CLINIC_OR_DEPARTMENT_OTHER): Payer: PRIVATE HEALTH INSURANCE

## 2013-06-17 DIAGNOSIS — M25561 Pain in right knee: Secondary | ICD-10-CM

## 2013-06-17 DIAGNOSIS — M25562 Pain in left knee: Principal | ICD-10-CM

## 2013-06-17 NOTE — Progress Notes (Signed)
06/17/13 [facility]   Visit   Visit number 4   Time Calculation   Start Time 1530   Stop Time 1600   Time Calculation (min) 30 min   Pain   Pain Score 0    Ther Exercise   Exercise upright bike x 5 min   Ther Exercise 2   Exercise SLR   Reps 2 10   Sets 2 2   Ther Exercise 3   Exercise 3 bridging with GTB    Reps 3 10   Sets 3 2   Ther Exercise 4   Exercise 4 clam shells GTB   Reps 4 10   Sets 4 2   Ther Exercise 5   Exercise 5 standing hip abd    Reps 5 10   Sets 5 2   Ther Exercise 6   Exercise 6 wall squats with GTB   Reps 6 10   Sets 6 2   Ther Exercise 8   Exercise 8 floor to stand transfers   Patient Education   What was taught? transfers, HEP   Method Verbal;Practice   Patient comprehension Yes

## 2013-06-17 NOTE — Patient Instructions (Signed)
Index English version Illustration     Bursitis de la pata de ganso (rodilla)   (Pes Anserine Bursitis)   Qu es bursitis de la pata de ganso?   Bursitis de la pata de ganso es una irritacin o inflamacin de una bursa de la rodilla. Una bursa es una bolsa llena de fluido que hace de colchn entre los tendones, los huesos y la piel.  La bursa de la pata de ganso est ubicada en la parte interna de la rodilla, justo por debajo de la articulacin. Los tendones de tres msculos se conectan al [employer] de la pantorrilla (tibia) pasando sobre esta bolsa. Estos msculos sirven para doblar la rodilla, acercar una rodilla a la otra y cruzarse de piernas.  La bursitis de la pata de ganso es comn entre nadadores de [place], y a Clinical cytogeneticist se llama rodilla de nadador de [place].  Cmo ocurre?   La bursitis de la pata de ganso puede ocurrir por:   uso excesivo, como por ejemplo en la patada de pecho en natacin o cuando se patea una pelota repetidamente    giro repetido del cuerpo partiendo de una posicin en que la rodilla est muy doblada    un golpe directo a la regin.   Cules son los sntomas?   La bursitis de la pata de ganso produce dolor en la parte interna de la rodilla, justo por debajo de la articulacin. Puede sentir dolor cuando dobla o endereza la pierna.  Cmo se diagnostica?   Su profesional mdico le examinar la rodilla para ver si est sensible en la regin de la bolsa de la pata de ganso.  Cmo se trata?   Para tratar este problema:   Aplquese hielo, gel de hielo o un paquete de verduras congelados envueltos en una tela sobre el rea cada 3 a 4 horas, hasta 20 minutos por vez.    Cuando se siente o se acueste, [place] su rodilla elevada apoyndola sobre una almohada.    Colquese una venda elstica alrededor de la rodilla para reducir la inflamacin o impedir que Therapist, occupational.    Tome un medicamento antiinflamatorio como ibuprofeno (ibuprofen) u otro medicamento de acuerdo a las instrucciones de su  profesional mdico. Los medicamentos antiinflamatorios no esteroides (NSAIDs, por sus siglas en ingls) pueden causar sangrado del estmago y otros problemas. Estos riesgos aumentan con [employer]. Lea la etiqueta y tmelos de acuerdo a las instrucciones. No los tome por ms de [address] a menos que se lo recomiende su profesional mdico.    Su profesional mdico le puede dar una inyeccin de un medicamento corticosteroide en la bolsa de la rodilla.    Siga las instrucciones de su profesional mdico para hacer los ejercicios de recuperacin.   Cunto duran los [place]?  El dolor de la bursitis de la pata de ganso en general desaparece en pocas semanas. Tiene que dejar de [employer] actividades que causan el dolor hasta que la rodilla haya cicatrizado. Si contina realizando actividades que causan dolor, sus sntomas volvern a Arts administrator y tardar ms tiempo en recuperarse.  Cundo puedo volver a mis actividades normales?  Cada persona se recupera de su lesin a un ritmo diferente. Su vuelta al nivel de actividad que realizaba anteriormente depender de la recuperacin de su rodilla y no de cuntos das o semanas han pasado desde que se produjo la lesin. En general, cunto ms tiempo tarde en iniciar su tratamiento despus de tener sntomas, ms tiempo tardar en sanarse. El [place] de  la rehabilitacin es que pueda volver a Optometrist sus actividades normales lo ms pronto posible. Si vuelve a sus actividades normales antes de [place], puede agravar su lesin.  Podr retornar al [place] normales en forma segura cuando pueda hacer lo siguiente, en el orden en que aparece en la lista:   Puede estirar y doblar la rodilla lesionada por completo sin dolor.    Su rodilla y su pierna han vuelto a tener la resistencia normal comparado con la rodilla y pierna que no estn lesionadas.    La bolsa de la rodilla ya no est inflamada y no sea sensible al tacto.    Puede caminar, agacharse y ponerse en cuclillas sin dolor.    Cmo se puede prevenir la bursitis de la pata de ganso?   La bursitis de la pata de ganso se puede prevenir con ejercicios de precalentamiento adecuados que estiran los msculos de la parte trasera del muslo, la parte interna del muslo y la parte superior del muslo. Tambin se puede prevenir si aumenta gradualmente su nivel de [place], en vez de tratar de hacer todo de golpe.  Escrito por el Dr. Joesphine Bare para [place].  Adult Advisor 2012.1 published by [employer].  Last modified: 2008-11-06  Last reviewed: 2009-01-18  [facility] material se revisa peridicamente y est sujeto a cambios en la medida que aparezca nueva informacin mdica. Se proporciona slo para fines informativos y Biomedical engineer, y no pretende Dispensing optician, consejo, diagnstico o tratamiento mdico proporcionados por su profesional de atencin de [employer]	[place].  Adult Advisor 2012.1 Index    2012 [employer] and/or its affiliates. All rights reserved.    Before doing your exercises use heat on your right knee.  After exercises you can use cold.  If you continue with right knee pain in 3 to 4 weeks you will need to make a follow up appointment and come back to see [place].

## 2013-06-17 NOTE — Progress Notes (Signed)
S: Patient reports that she is not feeling good today. She reports that she was cleaning the floor this morning and had a lot of pain in the right knee when she went to stand up.   O: Refer to Rehabilitation Treatment Flowsheet  A: Patient required cues for maintaining neutral knee alignment during wall squats, improved with GTB above knees to activate glut med. Patient able to tolerate all strengthening without pain. Patient dem improved hip and quad strength today. Patient ed re floor to stand transfers with demonstration and practice, using half kneel position with LLE fwd.   P: Continue progressing strength

## 2013-06-18 NOTE — Progress Notes (Signed)
Date of Service: 06/17/2013    ADDENDUM    I personally examined and spoke with the patient.  She has had some right knee pain, which has gradually resolved with physical therapy.  She did have a little increase in some knee pain, but it was different and occurred after doing some long squatting and this was this morning.  I think she is doing quite well and I expect this new pain to resolve with a little rest.    ___________________________  Reviewed and Electronically Signed By: Vertell Limber MD  Sig Date: 06/18/2013  Sig Time: 13:15:09  Dictated By: Vertell Limber MD  Dict Date: 06/17/2013 Dict Time: 02 47 PM    Dictation Date and Time:06/17/2013 14:47:58  Transcription Date and Time:06/17/2013 19:32:20  eScription Dictation id: [id number] Confirmation # :[id number]

## 2013-06-18 NOTE — Progress Notes (Signed)
Date of Service: 06/17/2013    CHIEF COMPLAINT:  Right knee pain.    INTERVAL HISTORY:  The patient was seen 10 weeks ago in mid-December 2014.  At that time, she reported right knee pain with an insidious onset over a number of years.  It was episodic, but became more frequent and painful and was referred to Orthopedics.      She has been in physical therapy and returns today with 1 PT session left and her knee is much improved.  She can go up stairs without difficulty and was doing very well until this morning when she squatted down to clean a floor.  On standing, she had a different intense pain at the right knee medially.  Her original pain was anterior just lateral of the midline.  This pain is clearly in a different location, medial just inferior to the joint line.    PHYSICAL EXAMINATION:  The patient is 48 years old.  She is 5 foot 1-1/2 inches and 170 pounds.  She is alert and oriented.  She is able to easily position herself on the exam table.  There is no obvious swelling.  There is tenderness over the anterior medial joint line.  There is no crepitus with passive range of motion.  There is a negative McMurray's. There is no instability.    RADIOGRAPHS:  No x-rays were obtained.  Previous x-rays showed no bony abnormality.    ASSESSMENT AND PLAN:  Mrs. Conteh is 30 and has had a very good response to physical therapy for her right knee pain that gradually developed over time.  She is finishing the physical therapy and comes today for a scheduled followup with a new pain that began this morning.      Given that this current complaint of right knee medial joint pain has improved over the last 8 hours, it is likely that it was a sprain caused by her moving from a squatted position to a standing position.  The patient is unable to take anti-inflammatories and uses Tylenol.  Anti-inflammatories upset her stomach.      She will finish this current session of PT and after a few days of rest will resume exercises  learned at PT.  I have instructed her to use warmth prior to exercising and to place cold after exercising.  If her knee pain continues after 3-4 weeks, she will call us for a followup appointment. I anticipate that this new injury is a sprain and will resolve.      The patient was seen and examined by Dr. Bjorn Loser who will also dictate a note.    ___________________________  Reviewed and Electronically Signed By: Rulon Abide PA-C  Sig Date: 07/02/2013  Sig Time: 10:44:38  Dictated By: Rulon Abide PA-C  Dict Date: 06/17/2013 Dict Time: 03 55 PM    Dictation Date and Time:06/17/2013 15:55:42  Transcription Date and Time:06/17/2013 20:08:59  eScription Dictation id: [id number] Confirmation # :[id number]

## 2013-06-23 ENCOUNTER — Ambulatory Visit (HOSPITAL_BASED_OUTPATIENT_CLINIC_OR_DEPARTMENT_OTHER): Payer: PRIVATE HEALTH INSURANCE

## 2013-08-30 ENCOUNTER — Encounter (HOSPITAL_BASED_OUTPATIENT_CLINIC_OR_DEPARTMENT_OTHER): Payer: Self-pay

## 2013-08-30 ENCOUNTER — Emergency Department (HOSPITAL_BASED_OUTPATIENT_CLINIC_OR_DEPARTMENT_OTHER)
Admission: RE | Admit: 2013-08-30 | Disposition: A | Discharge status: Home / Self Care | Payer: Self-pay | Source: Emergency Department | Attending: Emergency Medicine | Admitting: Emergency Medicine

## 2013-08-30 MED ORDER — CEPHALEXIN 500 MG PO CAPS
500.00 mg | ORAL_CAPSULE | Freq: Four times a day (QID) | ORAL | Status: AC
Start: 2013-08-30 — End: 2013-09-06

## 2013-08-30 MED ORDER — ACETAMINOPHEN 325 MG PO TABS
325.0000 mg | ORAL_TABLET | Freq: Four times a day (QID) | ORAL | Status: AC | PRN
Start: 2013-08-30 — End: 2013-09-29

## 2013-08-30 NOTE — ED Provider Notes (Signed)
ED Provider Note    ED Course & MDM:  Patient is a 48 year old female presented to emergency department with pain redness to her thumb.  Differential diagnosis includes early paronychia, early viral infection.  Given the fact that it is early in her process, we'll treat with antibiotic, but advised to return if her symptoms get worse.also given the fact that there is concern that this could be viral, will not lanced the area as this could make the infection worse Impression:  Cellulitis, finger, right  (primary encounter diagnosis)   HPI:  This 48 year old female patient presents with chief complaint of pain and swelling to her finger.  Patient reportsredness and swelling to her thumb.  Patient denies any fevers or chills.  She states this never happened to her before.  She states swelling and redness only.  Pain Score: 8 (8/10)  Arrival mode:  Self    ROS:  Con: No fevers, no chills   Resp: No SOB, no wheeze   CV: No chest pain, no palpitations   GI: No vomiting, no diarrhea   Skin: No rash, no pruritis     Pertinent positives were reviewed as per the HPI above. All other systems were reviewed and are negative. PE:  Gen: Well appearing, nad   Skin: Warm & dry, no bruising, redness to the distal thumb, right   Head: Atraumatic, anicteric sclera   ENT: Oropharynx clear, no swelling   Resp: Good air entry, CTA B/L   CV: RRR, no murmurs   Abd: Soft, non-distended, non-tender   Msk: No deformities, well-perfused, no edema   Neur: Normal speech, alert & conversant   Psyc: Normal affect, cooperative          PMH:    Past Medical History    Irregular menstrual cycle     Pregnant state, incidental     Comment: c/s breech    Esophageal reflux     SVT (supraventricular tachycardia) 3/10    Comment: [place]     08/30/13  0738   BP: 136/82   Pulse: 67   Temp: 98.1 F   Resp: 18   Weight: 77.111 kg (170 lb)   SpO2: 100%      PSH:      Past Surgical History     BREAST BIOPSY SPECIMEN      Comment s/p fibroadenoma on the L,      SEPTOPLASTY/SUBMUCOUS RESECJ W/WO CARTILAGE GRF      CESAREAN DELIVERY ONLY      Comment breech    Meds:    No current facility-administered medications for this encounter.  Current Outpatient Prescriptions:  cholecalciferol (VITAMIN D3) 2000 UNIT tablet Take 1 tablet by mouth daily. Disp: 30 tablet Rfl: 11   omeprazole (PRILOSEC) 20 MG capsule Take 1 capsule by mouth daily. Pt states OTC and not sure of dose. Disp: 30 capsule Rfl: 11   Calcium Carbonate-Vitamin D (CALCIUM 600+D) 600-400 MG-UNIT TABS Tablet Take 1 tablet by mouth 2 (two) times daily. Disp: 60 tablet Rfl: 11   metoprolol (TOPROL XL) 50 MG 24 hr tablet Take 1 tablet by mouth daily. Disp: 30 tablet Rfl: 11   albuterol (PROAIR HFA) 108 (90 BASE) MCG/ACT inhaler Inhale 2 puffs into the lungs See Admin Instructions. 1-2 puffs every 6 hours while awake on as scheduled basis for 3 days, and thereafter as needed. Always use together with aerochamber spacing device. Disp: 1 Inhaler Rfl: 0      SH:  Smoking status: Former Smoker  0.50 Packs/Day  For 27.00 Years     Types: Cigarettes    Quit date: 06/28/2002    Smokeless tobacco: Never Used    Alcohol Use: No    Allergies:  Review of Patient's Allergies indicates:   Ibuprofen               Other (See Comments)    Comment:Epigastric pain, throat swelling?             05/06/12 pt states uses sometimes without issue   Pollen extract-tree*    Runny Nose    Comment:HA, itchy watery eyes     Recent Lab Results:  Labs Reviewed - No data to display   Medication Orders Placed This Encounter  cephALEXin (KEFLEX) 500 MG capsule  acetaminophen (TYLENOL) 325 MG tablet     I have reviewed the ED nursing notes and prior records. I have reviewed the patient's past medical history/problem list, allergies, social history and medication list. Prior records as available electronically through the Epic record were reviewed.     Martinique Macdonald Rigor, DO  Attending Physician, [place]  Clinical Instructor, [place] of Medicine      This  Emergency Department patient encounter note was created using voice-recognition software and in real time during the ED visit. Please excuse any typographical errors that have not been edited out.

## 2013-08-30 NOTE — ED Triage Note (Signed)
Pt states pimple on right thumb Tuesday. Pt scratched pimple. Area on right thumb is swollen,red and painful. Pt took Motrin last night. No fevers. No trauma.

## 2013-08-30 NOTE — ED Notes (Signed)
Patient Disposition    Patient education for diagnosis, medications, activity, diet and follow-up.  Patient left ED 8:08 AM.  Patient received written instructions.  Interpreter to provide instructions: No, pt declined.    Have all existing LDAs been addressed? N/A    Have all IV infusions been stopped? N/A    Discharged to: Discharged to home.

## 2013-08-30 NOTE — ED Notes (Signed)
Pt seen by MD.

## 2013-08-30 NOTE — Discharge Instructions (Signed)
Cellulitis Cellulitis is an infection of the skin and the tissue beneath it. The infected area is usually red and tender. Cellulitis occurs most often in the arms and lower legs.  CAUSES  Cellulitis is caused by bacteria that enter the skin through cracks or cuts in the skin. The most common types of bacteria that cause cellulitis are Staphylococcus and Streptococcus. SYMPTOMS   Redness and warmth.  Swelling.  Tenderness or pain.  Fever. DIAGNOSIS  Your caregiver can usually determine what is wrong based on a physical exam. Blood tests may also be done. TREATMENT  Treatment usually involves taking an antibiotic medicine. HOME CARE INSTRUCTIONS   Take your antibiotics as directed. Finish them even if you start to feel better.  Keep the infected arm or leg elevated to reduce swelling.  Apply a warm cloth to the affected area up to 4 times per day to relieve pain.  Only take over-the-counter or prescription medicines for pain, discomfort, or fever as directed by your caregiver.  Keep all follow-up appointments as directed by your caregiver. SEEK MEDICAL CARE IF:   You notice red streaks coming from the infected area.  Your red area gets larger or turns dark in color.  Your bone or joint underneath the infected area becomes painful after the skin has healed.  Your infection returns in the same area or another area.  You notice a swollen bump in the infected area.  You develop new symptoms. SEEK IMMEDIATE MEDICAL CARE IF:   You have a fever.  You feel very sleepy.  You develop vomiting or diarrhea.  You have a general ill feeling (malaise) with muscle aches and pains. MAKE SURE YOU:   Understand these instructions.  Will watch your condition.  Will get help right away if you are not doing well or get worse. Document Released: 01/04/2005 Document Revised: 09/26/2011 Document Reviewed: 06/12/2011 ExitCare Patient Information 2014 [employer].  

## 2013-09-02 ENCOUNTER — Encounter (HOSPITAL_BASED_OUTPATIENT_CLINIC_OR_DEPARTMENT_OTHER): Payer: Self-pay

## 2013-09-02 ENCOUNTER — Telehealth (HOSPITAL_BASED_OUTPATIENT_CLINIC_OR_DEPARTMENT_OTHER): Payer: Self-pay

## 2013-09-02 ENCOUNTER — Emergency Department (HOSPITAL_BASED_OUTPATIENT_CLINIC_OR_DEPARTMENT_OTHER)
Admission: RE | Admit: 2013-09-02 | Disposition: A | Discharge status: Home / Self Care | Payer: Self-pay | Source: Emergency Department | Attending: Emergency Medicine | Admitting: Emergency Medicine

## 2013-09-02 MED ORDER — ACYCLOVIR 400 MG PO TABS
400.00 mg | ORAL_TABLET | Freq: Three times a day (TID) | ORAL | Status: AC
Start: 2013-09-02 — End: 2013-09-09

## 2013-09-02 MED ORDER — ACYCLOVIR 400 MG PO TABS
400.0000 mg | ORAL_TABLET | Freq: Once | ORAL | Status: AC
  Administered 2013-09-02: 400 mg via ORAL
  Filled 2013-09-02: qty 1

## 2013-09-02 NOTE — ED Provider Notes (Signed)
The patient was seen primarily by me. ED nursing record was reviewed. Prior records as available electronically through the Epic record were reviewed.    Patient's mode of arrival was by Self.  Arrival time: 09/02/2013  2:10 PM  Chief complaint: Hand Pain      HPI:    This 48 year old female with no significant past medical history who is seen at the [facility] emergency department 3 days ago for pain in her right thumb, presents with continued pain in her right thumb. Patient states that she has been taking the Keflex as prescribed, but she continues to have redness and pain of her right thumb. She denies any red streaks up her fingers, stiffness of her finger, or swelling of the finger. She notes that the redness started as a blister, and she is also had a couple small blisters in the area since then. She is also noted itching on the side of the thumb earlier today. She also notes a blister that started at the base of her middle finger on the right hand today. She denies any cold sores or genital lesions.      ROS: Pertinent positives were reviewed as per the HPI above.       Past Medical History/Problem list:    Past Medical History    Irregular menstrual cycle     Pregnant state, incidental     Comment: c/s breech    Esophageal reflux     SVT (supraventricular tachycardia) 3/10    Comment: [place]     Patient Active Problem List:     LUMP OR MASS IN BREAST     ESOPHAGEAL REFLUX     GASTRITIS NEC W/O HEMORRH     Abdominal Pain, Epigastric     ANGIONEUROTIC EDEMA     Plantar Fasciitis, L     Leg Pain     Tobacco abuse, in remission     Triggering of Finger, R 3rd     Right subacromial bursitis/rotator cuff tendinopathy     Chondromalacia Patellae     Lower back pain     De Quervain's Tenosynovitis, R     CTS (Carpal Tunnel Syndrome), b/l     Family history of diabetes mellitus (DM)     Hypolipoproteinemia     Intermittent Spinal Claudication     Vitamin D deficiency     Greater Trochanteric Bursitis, b/l     DDD  (Degenerative Disc Disease), Lumbar     Obesity     SVT To 240 BPM March 2010     Palpitations     Rapid Noctural Palpitations x 10 Seconds     Sweating     Microscopic hematuria     Reflux esophagitis     Lump of skin of lower extremity     Wheezing     Bilateral knee pain        Past Surgical History:     Past Surgical History    [place] BREAST BIOPSY SPECIMEN      Comment s/p fibroadenoma on the L,     SEPTOPLASTY/SUBMUCOUS RESECJ W/WO CARTILAGE GRF      CESAREAN DELIVERY ONLY      Comment breech       Medications:   No current facility-administered medications for this encounter.  Current Outpatient Prescriptions:  cephALEXin (KEFLEX) 500 MG capsule Take 1 capsule by mouth 4 (four) times daily. Disp: 28 capsule Rfl: 0   acyclovir (ZOVIRAX) 400 MG tablet Take 1 tablet  by mouth 3 (three) times daily. Disp: 21 tablet Rfl: 0   acetaminophen (TYLENOL) 325 MG tablet Take 1-2 tablets by mouth every 6 (six) hours as needed for Pain. Disp: 15 tablet Rfl: 0   albuterol (PROAIR HFA) 108 (90 BASE) MCG/ACT inhaler Inhale 2 puffs into the lungs See Admin Instructions. 1-2 puffs every 6 hours while awake on as scheduled basis for 3 days, and thereafter as needed. Always use together with aerochamber spacing device. Disp: 1 Inhaler Rfl: 0   cholecalciferol (VITAMIN D3) 2000 UNIT tablet Take 1 tablet by mouth daily. Disp: 30 tablet Rfl: 11   omeprazole (PRILOSEC) 20 MG capsule Take 1 capsule by mouth daily. Pt states OTC and not sure of dose. Disp: 30 capsule Rfl: 11   Calcium Carbonate-Vitamin D (CALCIUM 600+D) 600-400 MG-UNIT TABS Tablet Take 1 tablet by mouth 2 (two) times daily. Disp: 60 tablet Rfl: 11   metoprolol (TOPROL XL) 50 MG 24 hr tablet Take 1 tablet by mouth daily. Disp: 30 tablet Rfl: 11       Social History:   Social History   Marital Status: Divorced  Spouse Name: N/A    Years of Education: N/A  Number of Children: 1     Occupational History  housecleaning       Social History Main Topics   Smoking status: Former  Smoker  0.50 Packs/Day  For 27.00 Years     Types: Cigarettes    Quit date: 06/28/2002    Smokeless tobacco: Never Used    Alcohol Use: No    Drug Use: No    Sexual Activity: Not Currently    Partners: Male    Comment: no STI hx; HIV neg 2002     Other Topics Concern    Stress Concern Yes    Comment: rev    Weight Concern Yes    Comment: rev    Special Diet Yes    Comment: rev    Exercise No    Seat Belt Yes    Comment: rev    Self-Exams Yes    Comment: rev     Social History Narrative    [place], [place]. To [place] 2000.    Lives with her son and her parents. Divorced from husband.    Denies DV. No guns in home.       Allergies:  Review of Patient's Allergies indicates:   Ibuprofen               Other (See Comments)    Comment:Epigastric pain, throat swelling?             05/06/12 pt states uses sometimes without issue   Pollen extract-tree*    Runny Nose    Comment:HA, itchy watery eyes    Physical Exam:   Patient Vitals for the past 720 hrs:   Temp Pulse Resp BP SpO2   09/02/13 1441 96.5 F 95 18 137/79 mmHg 99 %       GENERAL:   no acute distress, non-toxic   SKIN:  Warm & Dry, mild erythema of the distal right thumb proximal to the nail, distal to the IP joint. No fluctuance. A couple vesicular lesions in the middle of the erythema. Patient also has a vesicle at the base of the middle finger of the right hand without surrounding erythema  HEAD:  NCAT. Sclerae are anicteric and aninjected. Oropharynx is clear without any lesions  NECK:  Supple. No stridor.  LUNGS:  No respiratory distress  EXTREMITIES:  No obvious deformities.  Warm and well perfused.  No edema.   NEUROLOGIC:  Alert and oriented x3; moves all extremities well; speaking in clear fluent sentences. Normal gait without ataxia; nonfocal.  PSYCHIATRIC:  Appropriate for age, time of day, and situation      ED Course and Medical Decision-making:  48 year old female patient with continued erythema and pain of her distal right thumb. Patient's exam and history  is consistent with herpetic whitlow rather than a bacterial infection. I will start the patient on a course of acyclovir. I counseled the patient to return to the emergency department if she had any new or worsening redness, pain, or other symptoms. If the patient continues to have worsening erythema and redness, she may develop a overlying bacterial infection causing a paronychia, very happy she has this at this time, and does not necessitate drainage today.    Reasons to return to the ED were reviewed in detail. All questions were answered. The patient agrees with this plan and disposition.      Condition on Discharge: Stable      Diagnosis/Diagnoses:  Herpetic whitlow  (primary encounter diagnosis)    Medications administered at this visit:     Medication Orders Placed This Encounter  acyclovir (ZOVIRAX) tablet 400 mg   Sig:   Order Specific Question: Specify indication:  Answer: Infection - organism unknown  Order Specific Question: Indicate type of infection:  Answer: Cellulitis - Skin/Soft Tissue  Order Specific Question: Indicate type of therapy:  Answer: New antimicrobial therapy  acyclovir (ZOVIRAX) 400 MG tablet   Sig: Take 1 tablet by mouth 3 (three) times daily.   Dispense:  21 tablet   Refill:  0    New prescriptions from this visit:  Discharge Medication List as of 09/02/2013  3:05 PM    START taking these medications    acyclovir (ZOVIRAX) 400 MG tablet  Take 1 tablet by mouth 3 (three) times daily.Tamperproof, Oral, 3 TIMES DAILY, Disp-21 tablet, R-0           Clarita Crane, MD, MPH  09/02/2013   Dept.of Emergency [place]    This Emergency Department patient encounter note was created using voice-recognition software and in real time during the ED visit. Please excuse any typographical errors that have not yet been reviewed and corrected.

## 2013-09-02 NOTE — Progress Notes (Signed)
C\O pain in 2 fingers now.  No relief with antibiotic.  Reports 12 out or 10 pain level.  Wants to be seen.  Cannot even sleep with this pain.  ? Gout.

## 2013-09-02 NOTE — ED Triage Note (Signed)
Was seen at  [facility] last week for right thumb infection. Has been taking Keflex as ordered with no effects. States pain has increased, and infection has now spread to right ring finger. Pain scale 10/10

## 2013-09-02 NOTE — Telephone Encounter (Signed)
-----   Message from Areta Haber sent at 09/02/2013 10:45 AM EDT -----  Regarding: sick      Deborah [name] [id number], 48 year old, female, Telephone Information:   Home Phone      [phone]  Work Phone      Not on file.  Mobile          [phone]      Patient's Preferred Pharmacy:     [facility]  Phone: [phone] Fax: [phone]      CONFIRMED TODAY: No    CALL BACK NUMBER:   Best time to call back:   Cell phone:   Other phone:    Available times:    Patient's language of care: [place]    Patient needs a [place] interpreter.    Patient's PCP: Malva Limes, MD    Person calling on behalf of patient: Patient (self)    Calls today with a sick call. Was seen in [place] er. Finger pain worse, meds not working

## 2013-09-02 NOTE — Progress Notes (Signed)
error 

## 2013-09-02 NOTE — Discharge Instructions (Signed)
DIAGNOSIS & TREATMENT:  You were seen in a [facility] Emergency Department for pain and redness of your thumb.    We think your symptoms may be from a viral infection of the skin, from the herpes virus.  You were treated with acyclovir.    NEW MEDICATIONS:  Acyclovir: This is an antiviral medication. Take the full course of the medication, even if symptoms have resolved.    Please use over the counter Tylenol (acetaminophen) and Motrin (ibuprofen) for pain control.   Take  Tylenol (acetaminophen) 650 MG ( 2 regular strength tablets) every 6 hours.   Take Motrin/Advil (ibuprofen)  600 MG (3 tablets) every 6 hours, and take with food.   You can them together every 6 hours, or alternate between Tylenol and Motrin taking one every three hours. Wait 6 hours between the SAME kind of medication.        Please make sure you monitor your Tylenol use. Tylenol is the same as acetaminophen. For adults, any amount over 4 grams (4,000 mg) in any 24 hour period can cause liver damage, possibly irreversible.   Please make sure to check all the medications you are taking including over-the counter (OTC) medications. Many multi-symptom medications have acetaminophen in them so please check carefully.    For an adult, do not take more than 3200 mg of Ibuprofen in any 24 hour period.     Please contact your doctor or pharmacist for any additional questions.       WHEN SHOULD YOU BE SEEN NEXT?   Please call your doctor and be seen with in the next 1-2 days for re-evaluation if your symptoms are not improving. If you do not have a primary care doctor or would like to transfer your primary care to [facility], please call [phone] to set one up an appointment.    WHEN SHOULD YOU RETURN TO THE ED?  Please contact a care provider or return to the Emergency Department (ED) if you experience new or worsening symptoms, if the redness and pain progresses down your hand, if your thumb swells up, or for any  other concerns.

## 2013-09-02 NOTE — ED Notes (Signed)
MED AS ORDERED. D/C PLAN REVIEWED AND SIGNED

## 2013-09-02 NOTE — ED Notes (Signed)
Patient Disposition    Patient education for diagnosis, medications, activity, diet and follow-up.  Patient left ED 3:19 PM.  Patient rep received written instructions.  Interpreter to provide instructions: NO    Have all existing LDAs been addressed? YES    Have all IV infusions been stopped?YES    Discharged to: HOME

## 2013-09-02 NOTE — Progress Notes (Signed)
Pt wants to be seen today.  Declines apt for tomorrow.   Checked with [facility], no available apts today.   Will go to ED now.

## 2013-09-05 ENCOUNTER — Telehealth (HOSPITAL_BASED_OUTPATIENT_CLINIC_OR_DEPARTMENT_OTHER): Payer: Self-pay | Admitting: Registered Nurse

## 2013-09-05 NOTE — Progress Notes (Signed)
Purple Team : ER Follow up call    RN outreach call to patient who was seen at emergency room     I was unable  to reach / speak with patient     Was call made with the assistance of an interpreter?  , Language:     Update/Plan:    Was an ER follow up appointment scheduled for patient?  No  Letter sent      I left a vm for Marlou Sa to return call to clinic with questions and or concerns or to schedule a follow up appointment with Malva Limes, MD regarding recent ED visit for .  Message sent to Malva Limes, MD

## 2013-09-09 ENCOUNTER — Telehealth (HOSPITAL_BASED_OUTPATIENT_CLINIC_OR_DEPARTMENT_OTHER): Payer: Self-pay | Admitting: Urology

## 2013-09-09 NOTE — Progress Notes (Signed)
.  PT HAS U/S SCHEDULED AT [facility] Tuesday September 23, 2013 AT 11:00. SHE WILL SEE DR LIOU ON Thursday October 23, 2013 AT 9:00 [facility]  DT 6/1

## 2013-09-23 ENCOUNTER — Ambulatory Visit: Payer: Self-pay | Admitting: Urology

## 2013-09-23 ENCOUNTER — Other Ambulatory Visit (HOSPITAL_BASED_OUTPATIENT_CLINIC_OR_DEPARTMENT_OTHER): Payer: Self-pay | Admitting: Surgical

## 2013-09-23 DIAGNOSIS — R3129 Other microscopic hematuria: Secondary | ICD-10-CM

## 2013-09-23 LAB — US RENAL

## 2013-09-23 NOTE — Addendum Note (Signed)
Addended byArdelle Balls on: 09/23/2013 11:05 AM     Modules accepted: Orders

## 2013-10-08 ENCOUNTER — Encounter (HOSPITAL_BASED_OUTPATIENT_CLINIC_OR_DEPARTMENT_OTHER): Payer: Self-pay | Admitting: Internal Medicine

## 2013-10-08 ENCOUNTER — Ambulatory Visit (HOSPITAL_BASED_OUTPATIENT_CLINIC_OR_DEPARTMENT_OTHER): Payer: Medicaid Other | Admitting: Internal Medicine

## 2013-10-08 VITALS — BP 100/80 | HR 63 | Temp 96.1°F | Resp 16 | Wt 183.0 lb

## 2013-10-08 DIAGNOSIS — N63 Unspecified lump in unspecified breast: Secondary | ICD-10-CM

## 2013-10-08 DIAGNOSIS — Z13220 Encounter for screening for lipoid disorders: Secondary | ICD-10-CM

## 2013-10-08 LAB — LIPID PANEL
Cholesterol: 192 mg/dL (ref 0–239)
HIGH DENSITY LIPOPROTEIN: 48 mg/dL (ref 40–?)
LOW DENSITY LIPOPROTEIN DIRECT: 129 mg/dL (ref 0–189)
TRIGLYCERIDES: 223 mg/dL — ABNORMAL HIGH (ref 0–150)

## 2013-10-08 NOTE — Progress Notes (Signed)
HPI Comments:   Deborah [name] is a 48 year old female    Review of Patient's Allergies indicates:   Ibuprofen               Other (See Comments)    Comment:Epigastric pain, throat swelling?             05/06/12 pt states uses sometimes without issue   Pollen extract-tree*    Runny Nose    Comment:HA, itchy watery eyes    Current Outpatient Prescriptions:  albuterol (PROAIR HFA) 108 (90 BASE) MCG/ACT inhaler, Inhale 2 puffs into the lungs See Admin Instructions. 1-2 puffs every 6 hours while awake on as scheduled basis for 3 days, and thereafter as needed. Always use together with aerochamber spacing device., Disp: 1 Inhaler, Rfl: 0  cholecalciferol (VITAMIN D3) 2000 UNIT tablet, Take 1 tablet by mouth daily., Disp: 30 tablet, Rfl: 11  omeprazole (PRILOSEC) 20 MG capsule, Take 1 capsule by mouth daily. Pt states OTC and not sure of dose., Disp: 30 capsule, Rfl: 11  Calcium Carbonate-Vitamin D (CALCIUM 600+D) 600-400 MG-UNIT TABS Tablet, Take 1 tablet by mouth 2 (two) times daily., Disp: 60 tablet, Rfl: 11  metoprolol (TOPROL XL) 50 MG 24 hr tablet, Take 1 tablet by mouth daily., Disp: 30 tablet, Rfl: 11  No current facility-administered medications for this visit.    Patient Active Problem List:     LUMP OR MASS IN BREAST     ESOPHAGEAL REFLUX     GASTRITIS NEC W/O HEMORRH     Abdominal Pain, Epigastric     ANGIONEUROTIC EDEMA     Plantar Fasciitis, L     Leg Pain     Tobacco abuse, in remission     Triggering of Finger, R 3rd     Right subacromial bursitis/rotator cuff tendinopathy     Chondromalacia Patellae     Lower back pain     De Quervain's Tenosynovitis, R     CTS (Carpal Tunnel Syndrome), b/l     Family history of diabetes mellitus (DM)     Hypolipoproteinemia     Intermittent Spinal Claudication     Vitamin D deficiency     Greater Trochanteric Bursitis, b/l     DDD (Degenerative Disc Disease), Lumbar     Obesity     SVT To 240 BPM March 2010     Palpitations     Rapid Noctural Palpitations x 10  Seconds     Sweating     Microscopic hematuria     Reflux esophagitis     Lump of skin of lower extremity     Wheezing     Bilateral knee pain    Social History    Marital Status: Divorced            Spouse Name:                       Years of Education:                 Number of children: 1             Occupational History  Occupation          Data processing manager  Social History Main Topics    Smoking Status: Former Smoker                   Packs/Day: 0.50  Years: 27        Types: Cigarettes      Quit date: 06/28/2002    Smokeless Status: Never Used                        Alcohol Use: No              Drug Use: No              Sexual Activity: Not Currently     Partners with: Female       Comment: no STI hx; HIV neg 2002    Other Topics            Concern  Stress Concern          Yes  Weight Concern          Yes  Special Diet            Yes  Exercise                No  Seat Belt               Yes  Self-Exams              Yes    Social History Narrative    [place], [place]. To [place] 2000.    Lives with her son and her parents. Divorced from husband.    Denies DV. No guns in home.      Pt walked in today for urgent care visit with c/o breast swelling    "It is like they are full of milk."    Notes x 1 month of worsening b/l breast swelling, although this has been occurring for years. She feels fine in the morning upon awakening, but after working/being active throughout the day, she experiences painful swelling at breasts globally b/l. This is to the point where her clothes do not fit. With rest overnight, this resolves. Denies any nipple discharge or lumps at breasts b/l. Denies any weight loss or nightsweats.       Physical Exam   Constitutional: She appears well-developed and well-nourished. No distress.   HENT:   Head: Normocephalic and atraumatic.   Pulmonary/Chest: Effort normal.   No lumps, nipple d/c, overlying skin changes, or axillary LAD b/l      Genitourinary: No breast swelling, tenderness, discharge or bleeding.   Neurological: She is alert.   Skin: Skin is warm and dry. She is not diaphoretic.   Psychiatric: Her behavior is normal.   Vitals reviewed.    PE: chaperoned by Kirby Crigler, [place]     10/08/13  1121   BP: 100/80   Pulse: 63   Temp: 96.1 F (35.6 C)   TempSrc: Temporal   Resp: 16   Weight: 183 lb (83.008 kg)     (611.72) Breast swelling  (primary encounter diagnosis)  Comment: b/l, transient with activity, painful with no associated LAD palpable - concerning for lymphedema with malignancy unlikely given transient symptoms and b/l sxs  Plan: REFERRAL TO [facility] ( [facility])        For eval    (V77.91) Lipid screening  Comment: last screening in 2010 - nml LDL/HDL  Plan: LIPID PANEL, COLLECTION VENOUS BLOOD  VENIPUNCTURE        rechecking    F/u c PCP prn and as directed    I have spent 25 minutes in face to face time with this patient/patient proxy of which > 50% was in counseling or coordination of care regarding above issues/Dx.

## 2013-10-14 ENCOUNTER — Ambulatory Visit (HOSPITAL_BASED_OUTPATIENT_CLINIC_OR_DEPARTMENT_OTHER): Payer: Medicaid Other | Admitting: Nurse Practitioner

## 2013-10-14 VITALS — BP 100/75 | Temp 98.6°F

## 2013-10-14 DIAGNOSIS — N6459 Other signs and symptoms in breast: Secondary | ICD-10-CM

## 2013-10-14 NOTE — Patient Instructions (Signed)
Autoconhecimento das mamas   (Breast Self-Awareness)  A prática do autoconhecimento das mamas pode detectar problemas precocemente, evitar significativamente complicações médicas e possivelmente, salvar sua vida. Ao praticar o autoconhecimento das mamas, você pode se familiarizar em como elas são e se elas se alterarem. Isto permite que você observe alterações precocemente. Isso também oferece alguma confiança de que a saúde de suas mamas está boa. Uma forma de aprender o que é normal para suas mamas e se elas estão se alterando é realizar um auto-exame delas.    Se você encontrar um inchaço que não estava presente anteriormente, é melhor entrar em contato com seu médico imediatamente. Outras observações que devem ser avaliadas por seu médico incluem escorrimento pelo mamilo, especialmente com sangue; alterações na pele ou vermelhidão; áreas onde a pele parece que está para dentro (retraída); ou novos inchaços e protuberâncias. A dor na mama raramente está associada com câncer (malignidade), mas também deve ser avaliada por um médico.   COMO REALIZAR UM AUTO-EXAME DAS MAMAS   O melhor momento para examinar suas mamas é de 5 a 7 dias após o término de seu período menstrual. Durante a menstruação, as mamas ficam mais granuladas e pode ser mais difícil notar alterações. Se você não menstrua, tiver atingido a menopausa ou tiver o útero removido (histerectomia), você deve examinar suas mamas a intervalos regulares, por exemplo, mensalmente. Se estiver amamentando, examine suas mamas após uma amamentação ou após usar uma bomba de remoção de leite. Implantes nas mamas não diminuem o risco de inchaços ou tumores, então, costume realizar o auto-exame das mamas conforme recomendado. Converse com seu médico sobre como determinar a diferença entre o implante e o tecido da mama. Também, converse sobre a quantidade de pressão que você deve exercer durante o exame. Com o tempo, você ficará mais familiarizada com as variações de  suas mamas e mais confortável com o exame. Um auto-exame das mamas exige que você retire todas as suas roupas acima da cintura.  1. Observe suas mamas e mamilos. Fique em frente a um espelho em um quarto com boa iluminação. Com as mãos em seu quadril, empurre-as firmemente para baixo. Observe uma diferença na forma, contorno e tamanho de uma mama à outra (assimetria). A assimetria inclui dobras, declives ou protuberâncias. Também, observe alterações na pele, como vermelhidão ou áreas escamosas nas mamas. Observe alterações nos mamilos, como derramamento, deslocamento ou vermelhidão.  2. Sinta suas mamas com cuidado. Isto é melhor feito no chuveiro ou ducha ao usar água ensaboada ou quando estiver deitada de costas. Coloque o braço (no lado da mama que estiver examinando) acima de sua cabeça. Utilize a parte de cima (não as pontas dos dedos) de seus três dedos do meio da mão oposta para sentir suas mamas. Comece na área abaixo do braço e utilize 2 cm de círculos sobrepostos para sentir sua mama. Utilize 3 níveis diferentes de pressão (leve, médio e firme) em cada círculo antes de se mover ao próximo. A pressão leve é necessária para sentir tecidos próximos à pele. A pressão média ajudará a sentir o tecido da mama um pouco mais profundamente, enquanto que a firme é necessária para sentir o tecido próximo às costelas. Continue com os círculos sobrepostos, movendo os dedos para baixo da mama até que sinta suas costelas abaixo da mama. Então, mova o comprimento de um dedo em direção ao centro do corpo. Continue a utilizar círculos de sobreposição de 2 cm para sentir suas mamas conforme move os dedos lentamente em direção à   clavícula, próxima à base do pescoço. Continue o exame para cima e para baixo utilizando todas as 3 pressões até alcançar o meio do tórax. Faça isso em cada mama à procura de inchaços ou alterações.  3.  Mantenha um registro escrito com alterações nas mamas ou achados normais para cada mama. Ao anotar  essas informações, você não precisará depender somente da memória em relação ao tamanho, sensibilidade ou localização. Escreva aonde está em seu ciclo menstrual, se ainda estiver menstruando.  O tecido da mama pode ter alguns inchaços ou tecido grosso. Contudo, consulte seu médico se sentir algo que a preocupa.     PROCURE UM MÉDICO SE:   · Notar uma alteração na forma, contorno ou tamanho de suas mamas ou mamilos.    · Notar alterações na pele, como vermelhidão ou áreas escamosas nas mamas ou mamilos.    · Tiver um fluxo não normal de seus mamilos.    · Sentir um novo inchaço ou áreas grossas incomuns.    Document Released: 03/27/2005 Document Revised: 03/13/2012  ExitCare® Patient Information ©2014 [employer].

## 2013-10-14 NOTE — Progress Notes (Signed)
This office note has been dictated. Account number [id number]

## 2013-10-14 NOTE — Progress Notes (Signed)
Date of Service: 10/14/2013    SUBJECTIVE:  This is a 48 year old new patient, Portuguese-speaking [place] woman seen with in person [facility] interpreter.  She comes for evaluation of breast swelling.  For the past several months, she has noted a sensation of heaviness and fullness in both breasts, which seems to worsen as the day goes on.  Her bra feels constricting, she feels puffy throughout her body.  She notes this after a long day of work.  It always returns to normal by morning.  She had similar complaints several years back.  She has had no focal area of swelling, no breast lumps, no breast pain, no nipple discharge, and no prior surgery, imaging, or biopsy.  She does examine her breasts.  She notes deep shoulder grooves from her bra, and feels that the breast is constricting.  She reports a normal mammogram a couple of years ago.    PAST MEDICAL HISTORY:  Notable for reflux, SVT, obesity.  A breast biopsy specimen is noted on her problem list, although she does not recall a biopsy.    MEDICATIONS:  Include:  Albuterol, vitamin D3, Prilosec, calcium with vitamin D, and Toprol-XL.    ALLERGIES:  IBUPROFEN and POLLEN EXTRACT noted.    REPRODUCTIVE HISTORY:  Shows menarche at 33, last period at 34, G49 P1, first birth at 54, did breast feed, 20-year history of OCP use.    FAMILY HISTORY:  No family history of breast or ovarian cancer.    REVIEW OF SYSTEMS:  On further recollection, she describes history for removal of a fibroadenoma with dysplasia.    SOCIAL HISTORY:  She is a smoker x22 years.  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    Her most recent mammogram from March 06, 2012 was BI-RADS 1 and was reviewed.    PHYSICAL EXAMINATION:  GENERAL:  She is an overweight woman in no distress.  VITAL SIGNS:  Noted.  BREASTS:  Examined in 2 positions and are visually symmetric, large, pendulous with deep shoulder grooves noted, and a bra that is slightly small and indeed constricting.   There were no masses, no areas of tenderness, and no skin changes.  Nipple areolar complexes normal, no inducible discharge, no supraclavicular or axillary adenopathy.    I think her symptoms are related in part to large pendulous breasts in a bra with inadequate support, and second likely some hormonal fluctuations also contributing.  We will update her screening mammogram in the coming days.  There is nothing suggestive of a concerning underlying etiology here.  She was given information about [facility] to go for help in getting a new bra.  She will follow up with [place] with any changes or concerns.    ___________________________  Reviewed and Electronically Signed By: Narda Bonds DE FILIPPI APRN  Sig Date: 01/06/2014  Sig Time: 17:02:05  Dictated By: Narda Bonds DE FILIPPI APRN  Dict Date: 10/14/2013 Dict Time: 03 48 PM    Dictation Date and Time:10/14/2013 15:48:42  Transcription Date and Time:10/14/2013 16:32:47  eScription Dictation id: [id number] Confirmation # :[id number]

## 2013-10-20 ENCOUNTER — Ambulatory Visit (HOSPITAL_BASED_OUTPATIENT_CLINIC_OR_DEPARTMENT_OTHER): Payer: Self-pay | Admitting: Nurse Practitioner

## 2013-10-20 ENCOUNTER — Ambulatory Visit (HOSPITAL_BASED_OUTPATIENT_CLINIC_OR_DEPARTMENT_OTHER): Payer: PRIVATE HEALTH INSURANCE | Admitting: Urology

## 2013-10-23 ENCOUNTER — Ambulatory Visit (HOSPITAL_BASED_OUTPATIENT_CLINIC_OR_DEPARTMENT_OTHER): Payer: PRIVATE HEALTH INSURANCE | Admitting: Urology

## 2013-10-24 ENCOUNTER — Ambulatory Visit (HOSPITAL_BASED_OUTPATIENT_CLINIC_OR_DEPARTMENT_OTHER): Payer: Self-pay | Admitting: Nurse Practitioner

## 2013-10-24 DIAGNOSIS — N6459 Other signs and symptoms in breast: Secondary | ICD-10-CM

## 2013-10-24 LAB — MA SCREENING MAMMO BILATERAL WITH CAD

## 2013-10-27 ENCOUNTER — Ambulatory Visit (HOSPITAL_BASED_OUTPATIENT_CLINIC_OR_DEPARTMENT_OTHER): Payer: Medicaid Other | Admitting: Urology

## 2013-10-27 ENCOUNTER — Other Ambulatory Visit (HOSPITAL_BASED_OUTPATIENT_CLINIC_OR_DEPARTMENT_OTHER): Payer: Self-pay | Admitting: Internal Medicine

## 2013-10-27 ENCOUNTER — Encounter (HOSPITAL_BASED_OUTPATIENT_CLINIC_OR_DEPARTMENT_OTHER): Payer: Self-pay | Admitting: Urology

## 2013-10-27 DIAGNOSIS — I471 Supraventricular tachycardia, unspecified: Secondary | ICD-10-CM

## 2013-10-27 DIAGNOSIS — R3129 Other microscopic hematuria: Secondary | ICD-10-CM

## 2013-10-27 LAB — URINALYSIS
BILIRUBIN, URINE: NEGATIVE
GLUCOSE, URINE: NEGATIVE MG/DL
KETONE, URINE: NEGATIVE MG/DL
LEUKOCYTE ESTERASE: NEGATIVE
NITRITE, URINE: NEGATIVE
OCCULT BLOOD, URINE: NEGATIVE
PH URINE: 6 (ref 5.0–8.0)
PROTEIN, URINE: NEGATIVE MG/DL
SPECIFIC GRAVITY URINE: 1.01 (ref 1.003–1.035)

## 2013-10-27 LAB — URINE DIP (POINT OF CARE)
BILIRUBIN, URINE: NEGATIVE
GLUCOSE, URINE: NEGATIVE mg/dl
KETONE, URINE: NEGATIVE mg/dl
LEUKOCYTE ESTERASE: NEGATIVE
NITRITE, URINE: NEGATIVE
PH URINE: 5.5 (ref 5.0–8.0)
PROTEIN, URINE: NEGATIVE mg/dl (ref 0–15)
SPECIFIC GRAVITY URINE: 1.015 (ref 1.003–1.030)
UROBILINOGEN URINE: 0.2 mg/dl (ref 0.2–1.0)

## 2013-10-27 NOTE — Progress Notes (Signed)
PER Pharmacy, Deborah [name] is a 48 year old female has requested a refill of metoprolol xl 50mg .      Last Office Visit: 10-08-13  Last Physical Exam: 02-25-13      HTN Med:    Most Recent BP Reading(s)  10/14/13 : 100/75  10/08/13 : 100/80  09/02/13 : 137/79    Documented patient preferred pharmacies:    [facility] ([facility])  Phone: [phone] Fax: [phone]

## 2013-10-27 NOTE — Progress Notes (Signed)
CC: FU on kidney stone    No symptoms  Review renal US    Exam Date: 09/23/13 Exam Status: Signed     Exam: RENAL ULTRASOUND   Reason for Exam: h/o microscopic hematuria and stones         Exam: Renal ultrasound.     Clinical Indication: Microscopic hematuria     The right kidney is normal in size, measuring 10.5 cm in length.   There are no echogenic foci to suggest renal calculi. There are no   solid or cystic lesions. There is no hydronephrosis.   The left kidney is normal in size, measuring 12 cm in length. There   are no echogenic foci to suggest renal calculi. There are no solid or   cystic lesions. There is no hydronephrosis.   Incidental note is made of fatty infiltration of the liver.     No abnormality of the bladder is noted.     Impression: Normal renal ultrasound. No significant changes seen to   the previous exam dated 05/22/2012.       UA - trace intact blood otherwise negative    AP  No current issues  Had a negative micro hematuria workup last year  FU PRN with me  I have spent more than 10/15 minutes in face to face counseling time with this patient and have answered all questions concerning the issues above.

## 2014-03-17 ENCOUNTER — Ambulatory Visit (HOSPITAL_BASED_OUTPATIENT_CLINIC_OR_DEPARTMENT_OTHER): Payer: PRIVATE HEALTH INSURANCE | Admitting: Internal Medicine

## 2014-04-21 ENCOUNTER — Ambulatory Visit (HOSPITAL_BASED_OUTPATIENT_CLINIC_OR_DEPARTMENT_OTHER): Payer: Medicaid Other | Admitting: Internal Medicine

## 2014-05-14 ENCOUNTER — Emergency Department (HOSPITAL_BASED_OUTPATIENT_CLINIC_OR_DEPARTMENT_OTHER)
Admission: RE | Admit: 2014-05-14 | Disposition: A | Discharge status: Home / Self Care | Payer: Self-pay | Source: Emergency Department | Attending: Emergency Medicine | Admitting: Emergency Medicine

## 2014-05-14 MED ORDER — DIPHENHYDRAMINE HCL 50 MG PO CAPS
50.00 mg | ORAL_CAPSULE | Freq: Once | ORAL | Status: AC
Start: 2014-05-14 — End: 2014-05-14
  Administered 2014-05-14: 50 mg via ORAL
  Filled 2014-05-14: qty 1

## 2014-05-14 MED ORDER — PREDNISONE 20 MG PO TABS
60.00 mg | ORAL_TABLET | Freq: Once | ORAL | Status: AC
Start: 2014-05-14 — End: 2014-05-14
  Administered 2014-05-14: 60 mg via ORAL
  Filled 2014-05-14: qty 3

## 2014-05-14 MED ORDER — EPINEPHRINE 0.3 MG/0.3ML AUTO-INJECTOR
0.3000 mg | Freq: Once | INTRAMUSCULAR | Status: DC | PRN
Start: 2014-05-14 — End: 2014-06-30

## 2014-05-14 MED ORDER — PREDNISONE 50 MG PO TABS
50.00 mg | ORAL_TABLET | Freq: Every day | ORAL | Status: AC
Start: 2014-05-14 — End: 2014-05-18

## 2014-05-14 MED ORDER — DIPHENHYDRAMINE HCL 25 MG PO CAPS
25.0000 mg | ORAL_CAPSULE | Freq: Four times a day (QID) | ORAL | Status: DC | PRN
Start: 2014-05-14 — End: 2014-06-30

## 2014-05-14 NOTE — ED Triage Note (Signed)
Took aleve 2 hrs PTA. Noticed lip swelling and rash. Took zyrtec just PTA

## 2014-05-14 NOTE — Narrator Note (Signed)
Re eval by MD. Vss. Dc instructed.

## 2014-05-14 NOTE — Narrator Note (Signed)
Patient Disposition    Patient education for diagnosis, medications, activity, diet and follow-up.  Patient left ED 4:34 PM.  Patient rep received written instructions.  Interpreter to provide instructions: No    Patient belongings with patient: YES    Have all existing LDAs been addressed? N/A    Have all IV infusions been stopped? N/A    Discharged to: Discharged to home

## 2014-05-15 NOTE — ED Provider Notes (Signed)
eMERGENCY dEPARTMENT eNCOUnter      The mode of arrival was Friend on 05/14/2014  3:00 PM.    A [employer] interpreter was used.    CHIEF COMPLAINT    Patient presents with:  Allergic Reaction: ?ALLERGIC RX-RX      HPI    Min Tunnell is a 49 year old female who presents on 05/14/2014  3:00 PM for evaluation of allergic reaction.  Patient states that she took Aleve 2 hours prior to arrival and noticed that her face and swelling.  She took Zyrtec and presents to the Emergency Department.  Denies any other previous history of nonsteroidal anti-inflammatory drug allergy.Denies any swelling within her mouth, dyspnea, nausea, vomiting or diarrhea.    PAST MEDICAL HISTORY      Past Medical History    Irregular menstrual cycle     Pregnant state, incidental     Comment: c/s breech    Esophageal reflux     SVT (supraventricular tachycardia) 3/10    Comment: TCH       SURGICAL HISTORY        Past Surgical History    [place] BREAST BIOPSY SPECIMEN      Comment s/p fibroadenoma on the L,     SEPTOPLASTY/SUBMUCOUS RESECJ W/WO CARTILAGE GRF      CESAREAN DELIVERY ONLY      Comment breech       CURRENT MEDICATIONS    1. metoprolol (TOPROL-XL) 50 MG 24 hr tablet  Route:  WGN:FAOZ 1 TABLET BY MOUTH DAILY.  Dispense: 30 tablet Refill: 11  Comment:Generic For:*TOPROL XL  50MG  need refills 10/28/18 ...    2. diphenhydrAMINE (BENADRYL) 25 MG capsule  Route: Oral HYQ:MVHQ 1 capsule by mouth every 6 (six) hours as needed for Itching.  Dispense: 15 capsule Refill: 0    3. predniSONE (DELTASONE) 50 MG tablet  Route: Oral ION:GEXB 1 tablet by mouth daily. for 4 days.  Dispense: 4 tablet Refill: 0    4. EXPIRED: EPINEPHrine (EPIPEN 2-PAK) 0.3 MG/0.3ML Injection Device  Route: Intramuscular MWU:XLKGMW 0.3 mg into the muscle once as needed.  Dispense: 2 each Refill: 1    5. EXPIRED: albuterol (PROAIR HFA) 108 (90 BASE) MCG/ACT inhaler  Route: Inhalation NUU:VOZDGU 2 puffs into the lungs See Admin Instructions. 1-2 puffs every 6 hours  while awake on as scheduled basis for 3 days, and thereafter as needed. Always use together with aerochamber spacing device.  Dispense: 1 Inhaler Refill: 0    6. EXPIRED: omeprazole (PRILOSEC) 20 MG capsule  Route: Oral YQI:HKVQ 1 capsule by mouth daily. Pt states OTC and not sure of dose.  Dispense: 30 capsule Refill: 11      ALLERGIES    Review of Patient's Allergies indicates:   Ibuprofen               Other (See Comments)    Comment:Epigastric pain, throat swelling?             05/06/12 pt states uses sometimes without issue   Pollen extract-tree*    Runny Nose    Comment:HA, itchy watery eyes    FAMILY HISTORY      Family History    Hypertension Mother     Hypertension Father     Diabetes Mother     Diabetes Maternal Uncle     Diabetes Maternal Uncle     Cancer - Other FamHxNeg     Cancer - Breast FamHxNeg     Cancer - Colon  FamHxNeg     Cancer - Lung FamHxNeg     Cancer - Ovarian FamHxNeg     Heart Maternal Grandmother     Heart Maternal Uncle     Heart Maternal Uncle        SOCIAL HISTORY    Social History    Marital Status: Divorced            Spouse Name:                       Years of Education:                 Number of children: 1             Occupational History  Occupation          Data processing manager                               Social History Main Topics    Smoking Status: Former Smoker                   Packs/Day: 0.50  Years: 27        Types: Cigarettes      Quit date: 06/28/2002    Smokeless Status: Never Used                        Alcohol Use: No              Drug Use: No              Sexual Activity: Not Currently     Partners with: Female       Comment: no STI hx; HIV neg 2002    Other Topics            Concern  Stress Concern          Yes  Weight Concern          Yes  Special Diet            Yes  Exercise                No  Seat Belt               Yes  Self-Exams              Yes    Social History Narrative    [place], [place]. To [place] 2000.    Lives with her  son and her parents. Divorced from husband.    Denies DV. No guns in home.        REVIEW OF SYSTEMS    All other systems reviewed and negative except as per history of present illness.    PHYSICAL EXAM    Vital Signs: BP 119/85 mmHg   Pulse 88   Temp(Src) 97.8 F   Resp 20   Wt 78.019 kg (172 lb)   SpO2 100%   LMP 10/17/2006   Constitutional:   No painful distress, Non-toxic appearance.   HENT:  Normocephalic, Atraumatic, Bilateral external ears normal, Oropharynx moist.  Treniece injury or edema of the lips  Neck: Normal range of motion. No tenderness, Supple, No stridor, No JVD.  Eyes:  PERRL, EOMI, Conjunctiva normal,  No discharge.  Mild periorbital edema  Respiratory:   No respiratory distress, No wheezing, No chest tenderness.   Cardiovascular:  Normal heart rate, regular rhythm, No murmurs.   Abdominal:  Soft, No tenderness, No masses, No pulsatile masses.   Musculoskeletal:   No edema, No tenderness, No cyanosis, No clubbing.  No  deformities noted.   Skin:  Warm, Dry, No erythema, No rash.   Lymphatic:  No lymphadenopathy noted.   Neurological:  Alert & oriented x 3, No focal deficits noted, Gait: Steady  Psychiatric:  Affect normal      ED COURSE & MEDICAL DECISION MAKING    Pertinent Labs & Imaging studies reviewed.  Medication list reviewed.  Nursing notes reviewed.  Prior records reviewed.  Patient presents with allergic reaction, presumably from Aleve.  No other new ingestions that would explain her reaction.  Her symptoms are limited to mild peri-orbital edema and angioedema of her lips.  Posterior oropharynx is widely patent, no lingual edema, lungs are clear.    Patient received Benadryl and prednisone.  She was observed and showed dramatic improvement in her symptoms.  I encouraged her to avoid nonsteroidal anti-inflammatory drugs in the meantime until she gets confirmatory allergy testing.  Discharged with Benadryl, EpiPen and encouraged to return for worsening symptoms.    FINAL  IMPRESSION  Allergic reaction, initial encounter  (primary encounter diagnosis)  Angioedema, initial encounter    DISPOSITION  Discharged home.  They agreed with emergency department management and verbalized understanding of instructions prior to discharge.     CONDITION  Stable    FOLLOW-UP  Follow-up Information     Follow up With Details Comments Contact Info    Romona Curls, MD  to schedule allergy testing [facility].  [place] [address]  [phone]  [phone]            Electronically signed by: Charline Bills. Levada Dy, DO, 05/15/2014 7:50 PM      This Emergency Department patient encounter note was created using voice-recognition software and in real time during the ED visit. Please excuse any typographical errors that have not been edited out.

## 2014-06-02 ENCOUNTER — Ambulatory Visit (HOSPITAL_BASED_OUTPATIENT_CLINIC_OR_DEPARTMENT_OTHER): Payer: Medicaid Other | Admitting: Physician Assistant

## 2014-06-02 ENCOUNTER — Encounter (HOSPITAL_BASED_OUTPATIENT_CLINIC_OR_DEPARTMENT_OTHER): Payer: Self-pay | Admitting: Physician Assistant

## 2014-06-02 VITALS — BP 100/70 | HR 68 | Temp 97.2°F | Wt 175.0 lb

## 2014-06-02 DIAGNOSIS — R220 Localized swelling, mass and lump, head: Secondary | ICD-10-CM

## 2014-06-02 NOTE — Progress Notes (Signed)
Pt feels safe at home

## 2014-06-02 NOTE — Progress Notes (Signed)
Deborah [name] is a 49 year old female who is seeing on follow-up from ED where she was there for an allergic reaction. She had taken aleeve and developed the onset of facial swelling and angioedema. Very swollen but responded well to the prednisone and was discharged home from the ED. Denies any other ingestions or new exposures around that time of reaction.        Review of Systems   Constitutional: Negative for fever, chills, weight loss and malaise/fatigue.   Respiratory: Negative for shortness of breath.    Skin: Negative.    Psychiatric/Behavioral: Negative.      Physical Exam   Constitutional: She appears well-developed and well-nourished. No distress.   HENT:   Head: Normocephalic and atraumatic.   Right Ear: External ear normal.   Left Ear: External ear normal.   Eyes: Conjunctivae are normal.   Pulmonary/Chest: Effort normal.   Musculoskeletal: Normal range of motion.   Neurological: She is alert.   Skin: Skin is warm and dry. She is not diaphoretic.   Psychiatric: She has a normal mood and affect. Her behavior is normal. Judgment and thought content normal.   Vitals reviewed.    BP 100/70 mmHg   Pulse 68   Temp(Src) 97.2 F (36.2 C) (Oral)   Wt 79.379 kg (175 lb)   LMP 10/17/2006    (R22.0) Facial swelling  (primary encounter diagnosis)  Comment:  Plan: Patient has had 2-3 exposures to ibuprofen/aleeve where she's developed some facial swelling and lastly angioedema. Further allergy testing is indicated secondary to her severe reaction. Was given epi-pen and we went over how to use this properly. Discussed outstanding HM such as pap, ifob etc. As well as f/u with cardiology.    REFERRAL TO ALLERGY ( INT)         I have reviewed the past medical, surgical, social and family history and updated these sections of EpicCare as relevant. All interim labs, test results, and consult notes were reviewed and discussed with Marlou Sa. Medications were reconciled during this visit and a current  medication list was given to the patient at the end of the visit.

## 2014-06-23 ENCOUNTER — Ambulatory Visit (HOSPITAL_BASED_OUTPATIENT_CLINIC_OR_DEPARTMENT_OTHER): Payer: Medicaid Other | Admitting: Otolaryngology

## 2014-06-23 VITALS — BP 102/71 | HR 62 | Temp 97.4°F

## 2014-06-23 DIAGNOSIS — L272 Dermatitis due to ingested food: Secondary | ICD-10-CM

## 2014-06-23 DIAGNOSIS — R21 Rash and other nonspecific skin eruption: Secondary | ICD-10-CM

## 2014-06-23 NOTE — Progress Notes (Addendum)
HPI: 49 year old female Deborah [name] complaints of body rash and lip swelling in early 05/2014 after eating vegetable containing cabbage and Aleve. She took zyrtec but felt very weak the 2nd morning. Eventually the rash and swelling resolved spontaneously. There was no respiratory sx. She has no h/o lip surgery.    Sx Relief Yes, completely.  Sx Exacerbation No      Past Medical History    Irregular menstrual cycle     Pregnant state, incidental     Comment: c/s breech    Esophageal reflux     SVT (supraventricular tachycardia) 3/10    Comment: TCH       Review of Patient's Allergies indicates:   Naproxen                Swelling    Comment:Ed visit with angioedema   Ibuprofen               Other (See Comments)    Comment:Epigastric pain, throat swelling?             05/06/12 pt states uses sometimes without issue   Pollen extract-tree*    Runny Nose    Comment:HA, itchy watery eyes      Current Outpatient Prescriptions on File Prior to Visit:  metoprolol (TOPROL-XL) 50 MG 24 hr tablet TAKE 1 TABLET BY MOUTH DAILY. Disp: 30 tablet Rfl: 11   omeprazole (PRILOSEC) 20 MG capsule Take 1 capsule by mouth daily. Pt states OTC and not sure of dose. Disp: 30 capsule Rfl: 11   albuterol (PROAIR HFA) 108 (90 BASE) MCG/ACT inhaler Inhale 2 puffs into the lungs See Admin Instructions. 1-2 puffs every 6 hours while awake on as scheduled basis for 3 days, and thereafter as needed. Always use together with aerochamber spacing device. Disp: 1 Inhaler Rfl: 0     No current facility-administered medications on file prior to visit.    Social and family history reviewed in [place] and are not changed.    Review of Systems  Constitutional normal.  CV/Palpitations No.  Resp/SOB No.  Integ/Skin lesions No.  MSk/Pain No.  Psych normal.    Physical Exam   06/23/14  1439   BP: 102/71   Pulse: 62   Temp: 97.4 F (36.3 C)       Constitutional: normal.  Facial lesions: No.  Ears: Right: Obstructing cerumen:No. EAC dry and  open. TM normal.    Left:  Obstructing cerumen:No. EAC dry and open. TM normal.   Facial Nerve: normal One out of 6 House-Brackmann scale bilaterally.  Nose:  normal.  Oral Cavity: normal.  O/P: Tonsils 1/4 in size bilateral.  Sinus: Pain No.  Neck: Soft. Trachea midline. No palpable masses.  Thyroid: Normal to palpation.  Lymph:  No palpable LN in the neck.  Salivary glands:  Normal size, non-tender.  Communication: Voice normal.    Eyes: PERRLA and EOMI. Nystagmus No  Respiratory drive spontaneous and comfortable.  Cardiac rhythm regular.  Neuro: normal.  Mood/Psych: normal.    A/P  49 year old female with possible food allergy vs Aleve allergy. Recommend basic food allergy and cabbage allergy testing. There is no Aleve allergy testing presently at [facility].

## 2014-06-30 ENCOUNTER — Ambulatory Visit (HOSPITAL_BASED_OUTPATIENT_CLINIC_OR_DEPARTMENT_OTHER): Payer: Medicaid Other | Admitting: Internal Medicine

## 2014-06-30 ENCOUNTER — Encounter (HOSPITAL_BASED_OUTPATIENT_CLINIC_OR_DEPARTMENT_OTHER): Payer: Self-pay | Admitting: Internal Medicine

## 2014-06-30 VITALS — BP 100/60 | HR 78 | Temp 97.9°F | Resp 14 | Ht 61.5 in | Wt 172.0 lb

## 2014-06-30 DIAGNOSIS — L509 Urticaria, unspecified: Secondary | ICD-10-CM

## 2014-06-30 DIAGNOSIS — K219 Gastro-esophageal reflux disease without esophagitis: Secondary | ICD-10-CM

## 2014-06-30 DIAGNOSIS — M79601 Pain in right arm: Secondary | ICD-10-CM

## 2014-06-30 DIAGNOSIS — R195 Other fecal abnormalities: Secondary | ICD-10-CM

## 2014-06-30 DIAGNOSIS — Z23 Encounter for immunization: Secondary | ICD-10-CM

## 2014-06-30 DIAGNOSIS — T783XXD Angioneurotic edema, subsequent encounter: Secondary | ICD-10-CM

## 2014-06-30 DIAGNOSIS — Z124 Encounter for screening for malignant neoplasm of cervix: Secondary | ICD-10-CM

## 2014-06-30 DIAGNOSIS — I471 Supraventricular tachycardia, unspecified: Secondary | ICD-10-CM

## 2014-06-30 DIAGNOSIS — M51369 Other intervertebral disc degeneration, lumbar region without mention of lumbar back pain or lower extremity pain: Secondary | ICD-10-CM

## 2014-06-30 DIAGNOSIS — E6609 Other obesity due to excess calories: Secondary | ICD-10-CM

## 2014-06-30 DIAGNOSIS — M79602 Pain in left arm: Secondary | ICD-10-CM

## 2014-06-30 DIAGNOSIS — M5136 Other intervertebral disc degeneration, lumbar region: Secondary | ICD-10-CM

## 2014-06-30 DIAGNOSIS — Z0000 Encounter for general adult medical examination without abnormal findings: Secondary | ICD-10-CM

## 2014-06-30 DIAGNOSIS — G9519 Other vascular myelopathies: Secondary | ICD-10-CM

## 2014-06-30 DIAGNOSIS — Z1211 Encounter for screening for malignant neoplasm of colon: Secondary | ICD-10-CM

## 2014-06-30 HISTORY — DX: Other fecal abnormalities: R19.5

## 2014-06-30 HISTORY — DX: Pain in right arm: M79.601

## 2014-06-30 LAB — POC IMMUNOASSAY FECAL OCCULT BLOOD TEST: POC FECAL OCCULT BLOOD TEST (IMMUNOASSAY): POSITIVE

## 2014-06-30 MED ORDER — EPINEPHRINE 0.3 MG/0.3ML AUTO-INJECTOR
0.3000 mg | Freq: Once | INTRAMUSCULAR | Status: AC | PRN
Start: 2014-06-30 — End: 2014-06-30

## 2014-06-30 NOTE — Progress Notes (Signed)
Influenza Vaccine Procedure  June 30, 2014    1. Has the patient received the information for the influenza vaccine? Yes    2. Does the patient have any of the following contraindications?  Allergy to eggs? No  Allergic reaction to previous influenza vaccines? No  Any other problems to previous influenza vaccines? No  Paralyzed by Guillain-Barre syndrome?  No  Current moderate or severe illness? No  Allergy to contact lens solution? No    3. The vaccine has been administered in the usual fashion.     Immunization information reviewed. Current VIS reviewed and given to patient/ guardian. Verbal assent obtained from patient/ guardian.  See immunization/Injection module or chart review for date of publication and additional information. Verbal assent obtained from patient/guardian. Comfort measures for possible side effects reviewed.

## 2014-06-30 NOTE — Progress Notes (Signed)
HPI Comments: Deborah [name] is a 49 year old female    Review of Patient's Allergies indicates:   Naproxen                Swelling    Comment:Ed visit with angioedema   Ibuprofen               Other (See Comments)    Comment:Epigastric pain, throat swelling?             05/06/12 pt states uses sometimes without issue   Pollen extract-tree*    Runny Nose    Comment:HA, itchy watery eyes    Current Outpatient Prescriptions:  metoprolol (TOPROL-XL) 50 MG 24 hr tablet, TAKE 1 TABLET BY MOUTH DAILY., Disp: 30 tablet, Rfl: 11  omeprazole (PRILOSEC) 20 MG capsule, Take 1 capsule by mouth daily. Pt states OTC and not sure of dose., Disp: 30 capsule, Rfl: 11  No current facility-administered medications for this visit.    Patient Active Problem List:     LUMP OR MASS IN BREAST     ESOPHAGEAL REFLUX     GASTRITIS NEC W/O HEMORRH     Abdominal Pain, Epigastric     Angioneurotic edema not elsewhere classified     Plantar Fasciitis, L     Leg Pain     Tobacco abuse, in remission     Triggering of Finger, R 3rd     Right subacromial bursitis/rotator cuff tendinopathy     Chondromalacia Patellae     Lower back pain     De Quervain's Tenosynovitis, R     CTS (Carpal Tunnel Syndrome), b/l     Family history of diabetes mellitus (DM)     Hypolipoproteinemia     Intermittent Spinal Claudication     Vitamin D deficiency     Greater Trochanteric Bursitis, b/l     DDD (Degenerative Disc Disease), Lumbar     Obesity     SVT To 240 BPM March 2010     Palpitations     Rapid Noctural Palpitations x 10 Seconds     Sweating     Microscopic hematuria     Reflux esophagitis     Lump of skin of lower extremity     Wheezing     Bilateral knee pain    Social History    Marital Status: Divorced            Spouse Name:                       Years of Education:                 Number of children: 1             Occupational History  Occupation          Data processing manager                               Social  History Main Topics    Smoking Status: Former Smoker                   Packs/Day: 0.50  Years: 27        Types: Cigarettes      Quit date: 06/28/2002  Smokeless Status: Never Used                        Alcohol Use: No              Drug Use: No              Sexual Activity: Not Currently     Partners with: Female       Comment: no STI hx; HIV neg 2002    Other Topics            Concern  Stress Concern          Yes  Weight Concern          Yes  Special Diet            Yes  Exercise                No  Seat Belt               Yes  Self-Exams              Yes    Social History Narrative    [place], [place]. To [place] 2000.    Lives with her son and her parents. Divorced from husband.    Denies DV. No guns in home.      Pt here for CPE and f/u re angioedema    Notes multiple recent episodes of hives with unclear triggers as well as recurrent angioedema after Aleve intake after many years without symptoms.        Review of Systems   Constitutional: Negative for weight loss.   Respiratory: Negative for shortness of breath.    Cardiovascular: Negative for chest pain.   Gastrointestinal: Positive for heartburn and abdominal pain. Negative for nausea, vomiting and diarrhea.        Notes that she has not been taking PPI regularly, but notes occasional sensation of something in her throat choking her; recent episode of RUQ pain after eating fish, not fried   Musculoskeletal: Positive for myalgias.        Generalized myalgias    Notes continued lower back pain radiating to R>L LEs without numbness/tingling    Also notes frequent b/l UE pain at forearms with weakened handgrip, transient   Skin:        See HPI   Neurological: Negative for dizziness and headaches.   Psychiatric/Behavioral: The patient does not have insomnia.      Physical Exam   Constitutional: She appears well-developed and well-nourished. No distress.   HENT:   Head: Normocephalic and atraumatic.   Mouth/Throat: Oropharynx is clear and moist. No oropharyngeal exudate.    Neck: Neck supple. No thyromegaly present.   Cardiovascular: Normal rate, regular rhythm and normal heart sounds.  Exam reveals no gallop and no friction rub.    No murmur heard.  Pulmonary/Chest: Effort normal and breath sounds normal. No respiratory distress. She has no wheezes. She has no rales. She exhibits no tenderness.   Abdominal: Soft. Bowel sounds are normal. She exhibits no distension and no mass. There is no tenderness. There is no rebound and no guarding.   Genitourinary: Vagina normal and uterus normal. No breast swelling, tenderness, discharge or bleeding. No erythema in the vagina. No vaginal discharge found.   Vaginal mucosa bleeds easily   Lymphadenopathy:     She has no cervical adenopathy.   Neurological: She is alert. Coordination normal.   Skin: Skin  is warm and dry. She is not diaphoretic.   Psychiatric: Her behavior is normal.   Vitals reviewed.         06/30/14  1402   BP: 100/60   Pulse: 78   Temp: 97.9 F (36.6 C)   TempSrc: Temporal   Resp: 14   Height: 5' 1.5" (1.562 m)   Weight: 78.019 kg (172 lb)   SpO2: 97%   pelvic and breast exam chaperoned by C Monzon, [place]    (Z00.00) Physical exam, routine  (primary encounter diagnosis)  Comment: obese female with no prior screening or tx for parasitic infections  Plan: next CPE >06/30/15    (T78.3XXD) Angioedema, subsequent encounter  Comment: recurrent after many years without sxs  Plan: REFERRAL TO IMMUNOLOGY (EXT)        If recurs, report to nearest ED; counseled re use of EpiPen if needed, as well as potential SEs of use, including syncope    (L50.9) Hives  Comment: unclear trigger, likely food allergy vs. Chronic urticaria  Plan: REFERRAL TO IMMUNOLOGY (EXT)        As above    (Z12.11) Special screening for malignant neoplasms, colon  Comment: IFOB+  Plan: POC IMMUNOASSAY FECAL OCCULT BLOOD TEST, POC         IMMUNOASSAY FECAL OCCULT BLOOD TEST        Positive; referring to GI    (G95.19) Intermittent spinal claudication  Comment: prev  noted; symptoms c/w sciatica now  Plan: monitor; avoid NSAIDs; counseled re need for wt loss    (M51.36) DDD (degenerative disc disease), lumbar  Comment: as above  Plan: as above    (E66.09) Non morbid obesity due to excess calories  Comment: chronic  Plan: counseled for wt loss    (I47.1) SVT To 240 BPM March 2010  Comment: asx on BB  Plan: continue current meds    (M79.601,  M79.602) Bilateral arm pain  Comment: L>R lateral epicondylitis  Plan: trial of counterforce brace; avoid NSAIDs; f/u prn to consider CCS injection    (K21.9) Gastroesophageal reflux disease without esophagitis  Comment: prev noted on EGD, with current sxs likely attributable to this  Plan: counseled to take PPI daily x 2 weeks; if not resolution, contact PCP to consider further evaluation, possibly repeat EGD with colonoscopy    (R19.5) Positive occult stool blood test  Comment: today  Plan: REFERRAL TO GASTROENTEROLOGY ( INT)        As above    (Z12.4) Screening for malignant neoplasm of cervix  Comment: normal exam, but with vaginal mucosa bleeding easily  Plan: CYTP CERV/VAG AUTO THIN LAYER PREP MNL SCREEN,         OBTAINING SCREEN PAP SMEAR, CHAPERONE ACCEPTED         FOR EXAM, HUMAN PAPILLOMAVIRUS          I have reviewed the past medical, surgical, social and family history and updated these sections of EpicCare as relevant. All interim labs, test results, and consult notes were reviewed and discussed with Marlou Sa. Medications were reconciled during this visit and a current medication list was given to the patient at the end of the visit.    F/u c PCP prn and as directed    I have spent 50 minutes in face to face time with this patient/patient proxy of which > 50% was in counseling or coordination of care regarding above issues/Dx.

## 2014-06-30 NOTE — Progress Notes (Signed)
error 

## 2014-06-30 NOTE — Progress Notes (Signed)
Patient feels safe at home.

## 2014-07-03 LAB — HUMAN PAPILLOMAVIRUS (HPV): HUMAN PAPILLOMAVIRUS: NEGATIVE

## 2014-07-06 LAB — STRONGYLOIDES AB IGG: STRONGYLOIDES AB IgG: 3 INDEX (ref 0.00–9.99)

## 2014-07-06 LAB — SCHISTOSOMAL AB IGG: SCHISTOSOMAL AB IgG: 6.12 INDEX (ref 0.00–8.99)

## 2014-07-06 LAB — VITAMIN D,25 HYDROXY: VITAMIN D,25 HYDROXY: 26 ng/mL — ABNORMAL LOW (ref 30.0–100.0)

## 2014-07-08 LAB — CYTOPATH, C/V, THIN LAYER

## 2014-07-09 ENCOUNTER — Other Ambulatory Visit (HOSPITAL_BASED_OUTPATIENT_CLINIC_OR_DEPARTMENT_OTHER): Payer: Self-pay | Admitting: Internal Medicine

## 2014-07-09 DIAGNOSIS — E559 Vitamin D deficiency, unspecified: Secondary | ICD-10-CM

## 2014-07-09 MED ORDER — VITAMIN D 50 MCG (2000 UT) PO TABS
2000.0000 [IU] | ORAL_TABLET | Freq: Every day | ORAL | Status: DC
Start: 2014-07-09 — End: 2016-01-18

## 2014-07-16 ENCOUNTER — Encounter (HOSPITAL_BASED_OUTPATIENT_CLINIC_OR_DEPARTMENT_OTHER): Payer: Self-pay | Admitting: Registered Nurse

## 2014-07-16 ENCOUNTER — Telehealth (HOSPITAL_BASED_OUTPATIENT_CLINIC_OR_DEPARTMENT_OTHER): Payer: Self-pay | Admitting: Registered Nurse

## 2014-07-16 ENCOUNTER — Emergency Department (HOSPITAL_BASED_OUTPATIENT_CLINIC_OR_DEPARTMENT_OTHER)
Admission: RE | Admit: 2014-07-16 | Disposition: A | Discharge status: Home / Self Care | Payer: Self-pay | Source: Emergency Department | Attending: Physician Assistant | Admitting: Physician Assistant

## 2014-07-16 MED ORDER — MECLIZINE HCL 25 MG PO TABS
25.00 mg | ORAL_TABLET | Freq: Once | ORAL | Status: AC
Start: 2014-07-16 — End: 2014-07-16
  Administered 2014-07-16: 25 mg via ORAL
  Filled 2014-07-16: qty 1

## 2014-07-16 MED ORDER — MECLIZINE HCL 25 MG PO TABS
25.00 mg | ORAL_TABLET | Freq: Two times a day (BID) | ORAL | Status: AC | PRN
Start: 2014-07-16 — End: 2014-07-23

## 2014-07-16 NOTE — ED Provider Notes (Signed)
ED nursing record was reviewed. Prior records as available electronically through the Epic record were reviewed.    HPI:    This 49 year old female patient presents to the Emergency Department with chief complaint of dizziness.  Patient reports since this morning she has felt dizzy, sensation increases when she turns her head.  She does describe dizziness as if the room is spinning.  States she has had a similar episode in the past when she received meclizine and symptoms resolved.  However she has run out of this medication.  She reports a mild headache this morning which has resolved.  Endorses nausea without vomiting.  Denies vision changes, neck or back pain, fever, chills, night sweats, numbness or tingling of her extremities, chest pain or shortness of breath.  She has not taken any medication for symptoms.    ROS: Pertinent positives were reviewed as per the HPI above. All other systems were reviewed and are negative.      Past Medical History/Problem list:    Past Medical History    Irregular menstrual cycle     Pregnant state, incidental     Comment: c/s breech    Esophageal reflux     SVT (supraventricular tachycardia) 3/10    Comment: [place]     Patient Active Problem List:     LUMP OR MASS IN BREAST     Gastroesophageal reflux disease without esophagitis     GASTRITIS NEC W/O HEMORRH     Abdominal Pain, Epigastric     Angioneurotic edema not elsewhere classified     Plantar Fasciitis, L     Leg Pain     Tobacco abuse, in remission     Triggering of Finger, R 3rd     Right subacromial bursitis/rotator cuff tendinopathy     Chondromalacia Patellae     Lower back pain     De Quervain's Tenosynovitis, R     CTS (Carpal Tunnel Syndrome), b/l     Family history of diabetes mellitus (DM)     Hypolipoproteinemia     Intermittent Spinal Claudication     Vitamin D deficiency     Greater Trochanteric Bursitis, b/l     DDD (Degenerative Disc Disease), Lumbar     Obesity     SVT To 240 BPM March 2010     Palpitations      Rapid Noctural Palpitations x 10 Seconds     Sweating     Microscopic hematuria     Reflux esophagitis     Lump of skin of lower extremity     Wheezing     Bilateral knee pain     Bilateral arm pain     Positive occult stool blood test        Past Surgical History:     Past Surgical History    [place] BREAST BIOPSY SPECIMEN      Comment s/p fibroadenoma on the L,     SEPTOPLASTY/SUBMUCOUS RESECJ W/WO CARTILAGE GRF      CESAREAN DELIVERY ONLY      Comment breech         Medications:   No current facility-administered medications on file prior to encounter.  Current Outpatient Prescriptions on File Prior to Encounter:  cholecalciferol (VITAMIN D3) 2000 UNIT tablet Take 1 tablet by mouth daily. Disp: 30 tablet Rfl: 11   metoprolol (TOPROL-XL) 50 MG 24 hr tablet TAKE 1 TABLET BY MOUTH DAILY. Disp: 30 tablet Rfl: 11   omeprazole (PRILOSEC) 20 MG  capsule Take 1 capsule by mouth daily. Pt states OTC and not sure of dose. Disp: 30 capsule Rfl: 11   OTHER MEDICATION counterforce brace for L arm Disp:  Rfl:          Social History:   Social History   Marital Status: Divorced  Spouse Name: N/A    Years of Education: N/A  Number of Children: 1     Occupational History  housecleaning       Social History Main Topics   Smoking status: Former Smoker  0.50 Packs/Day  For 27.00 Years     Types: Cigarettes    Quit date: 06/28/2002    Smokeless tobacco: Never Used    Alcohol Use: No    Drug Use: No    Sexual Activity: Not Currently    Partners: Male    Comment: no STI hx; HIV neg 2002     Other Topics Concern    Stress Concern Yes    Weight Concern Yes    Special Diet Yes    Exercise No    Seat Belt Yes    Self-Exams Yes     Social History Narrative    [place], [place]. To [place] 2000.    Lives with her son and her parents. Divorced from husband.    Denies DV. No guns in home.           Allergies:  Review of Patient's Allergies indicates:   Naproxen                Swelling    Comment:Ed visit with angioedema   Ibuprofen               Other  (See Comments)    Comment:Epigastric pain, throat swelling?             05/06/12 pt states uses sometimes without issue   Pollen extract-tree*    Runny Nose    Comment:HA, itchy watery eyes      Physical Exam:  BP 114/54 mmHg   Pulse 54   Temp(Src) 98.2 F   Resp 20   Wt 78.926 kg (174 lb)   SpO2 98%   LMP 10/17/2006    GENERAL: Well appearing, No acute distress, non-toxic.   SKIN:  Warm & Dry, no erythema or rash.  HEAD:  NCAT. Sclerae are anicteric and aninjected, oropharynx is clear with moist mucous membranes. PERRL. EOMI. B TMs clear.  NECK:  No C-spine tenderness, crepitus, step-off.  No meningismus.  No LAN. No stridor. Full ROM  LUNGS:  Clear to auscultation bilaterally. No wheezes, rales, rhonchi.   HEART:  RRR.  No murmurs, rubs, or gallops.   EXTREMITIES:  No obvious deformities.  CSM intact x 4.     NEUROLOGIC:  Alert and oriented x4; moves all extremities well; speaking in clear fluent sentences. CNsII-XII symmetrical and intact. Sensation intact to light touch throughout. 5/5 strength globally. Normoreflexic bilaterally. Finger to nose and back in tact. Rapid alternating movements in tact. Gait is stable.   PSYCHIATRIC:  Appropriate for age, time of day, and situation    RESULTS  No results found for this visit on 07/16/14 (from the past 24 hour(s)).        ED Course and Medical Decision-making:  This 49 year old female patient  presents to the Emergency Department with chief complaint of dizziness.  Vital signs stable upon presentation.  Physical exam is unremarkable including a nonfocal neurologic exam, there is no evidence of nystagmus.  I am not oncerned for central origin of patient's dizziness.  Episode is similar to her previous vertigo.    Patient is given a dose of meclizine and observed in the emergency department.  Upon reassessment, patient states the dizziness has resolved and she desires discharge.  She is given prescription for meclizine.  She will follow up with her doctor within 1 week  for reassessment.  She is instructed to return to Emergency Department if she expresses worsening symptoms, new symptoms such as fever, vomiting, increased dizziness, focal neurologic deficits, or she is unable to comply with discharge instructions.  Patient understands and agrees with plan.    Condition: stable  Disposition: home      Diagnosis/Diagnoses:  Vertigo  (primary encounter diagnosis)    Debara Pickett, PA-C

## 2014-07-16 NOTE — Narrator Note (Signed)
Lying quietly on stretcher. Reports continued but slightly improved dizziness.

## 2014-07-16 NOTE — Narrator Note (Signed)
Patient Disposition  Rx Meclizine reviewed with patient with stated understanding.  F/U PCP with progress report.  Return to ED at any time for any problems.   Patient education for diagnosis, medications, activity, diet and follow-up.  Patient left ED 5:12 PM.  Patient rep received written instructions.  Interpreter to provide instructions: No    Patient belongings with patient: YES    Have all existing LDAs been addressed? N/A    Have all IV infusions been stopped? N/A      Discharged to: Discharged to home with son.  Amb with steady gait

## 2014-07-16 NOTE — ED Triage Note (Signed)
Pt reports hx of vertigo. Last experienced symptoms ~ 6 months ago and treated successfully with Meclizine.  Pt woke up this morning and realized dizziness. Nausea when bending over or position change. She does not have any Meclizine at home.  +nausea, -vomiting.

## 2014-07-16 NOTE — Telephone Encounter (Signed)
-----   Message from [name] sent at 07/16/2014 12:50 PM EDT -----  Regarding: sick call - dizziness      Deborah [name] [id number], 49 year old, female, Telephone Information:   Home Phone      [phone]  Work Phone      Not on file.  Mobile          [phone]        Call back #  [phone]    Interpreter: no    Person Calling: patient    Reason for todays call?: sick  call      Sick call: dizziness    Symptoms: vomiting    How long have you been sick?:  Starting this morning        Reason to speak with a nurse only?    Reason to speak with a provider only?        Other call: Reason for call?

## 2014-07-16 NOTE — Progress Notes (Signed)
Left a message for the patient   to call RN [facility] [phone].  Await a return call.

## 2014-07-16 NOTE — Narrator Note (Signed)
Re-eval by Provider. Pt feels ready for discharge to home.

## 2014-07-16 NOTE — Discharge Instructions (Signed)
DIAGNOSIS & TREATMENT:  You were seen in a [facility] Emergency Department for dizziness. You were treated with meclizine    NEW MEDICATIONS:    Meclizine for dizziness    WHEN SHOULD YOU BE SEEN NEXT?   Please call your doctor and be seen with in the next 5 days for re-evaluation if your symptoms are not improving. If you do not have a primary care doctor or would like to transfer your primary care to [facility], please call [phone] to set one up an appointment.    WHEN SHOULD YOU RETURN TO THE ED?  Please return to the emergency room if you develop fever, increased difficulty breathing, you pass out, your symptoms worsen, you get new symptoms or you are unable to schedule follow up care.       Benign Positional Vertigo  Vertigo means you feel like you or your surroundings are moving when they are not. Benign positional vertigo is the most common form of vertigo. Benign means that the cause of your condition is not serious. Benign positional vertigo is more common in older adults.  CAUSES   Benign positional vertigo is the result of an upset in the labyrinth system. This is an area in the middle ear that helps control your balance. This may be caused by a viral infection, head injury, or repetitive motion. However, often no specific cause is found.  SYMPTOMS   Symptoms of benign positional vertigo occur when you move your head or eyes in different directions. Some of the symptoms may include:   Loss of balance and falls.   Vomiting.   Blurred vision.   Dizziness.   Nausea.   Involuntary eye movements (nystagmus).  DIAGNOSIS   Benign positional vertigo is usually diagnosed by physical exam. If the specific cause of your benign positional vertigo is unknown, your caregiver may perform imaging tests, such as magnetic resonance imaging (MRI) or computed tomography (CT).  TREATMENT   Your caregiver may recommend movements or procedures to correct the benign positional vertigo.  Medicines such as meclizine, benzodiazepines, and medicines for nausea may be used to treat your symptoms. In rare cases, if your symptoms are caused by certain conditions that affect the inner ear, you may need surgery.  HOME CARE INSTRUCTIONS    Follow your caregiver's instructions.   Move slowly. Do not make sudden body or head movements.   Avoid driving.   Avoid operating heavy machinery.   Avoid performing any tasks that would be dangerous to you or others during a vertigo episode.   Drink enough fluids to keep your urine clear or pale yellow.  SEEK IMMEDIATE MEDICAL CARE IF:    You develop problems with walking, weakness, numbness, or using your arms, hands, or legs.   You have difficulty speaking.   You develop severe headaches.   Your nausea or vomiting continues or gets worse.   You develop visual changes.   Your family or friends notice any behavioral changes.   Your condition gets worse.   You have a fever.   You develop a stiff neck or sensitivity to light.  MAKE SURE YOU:    Understand these instructions.   Will watch your condition.   Will get help right away if you are not doing well or get worse.  Document Released: 01/02/2006 Document Revised: 06/19/2011 Document Reviewed: 12/15/2010  [facility] Patient Information 2015 [place], [place]. This information is not intended to replace advice given to you by your health care  provider. Make sure you discuss any questions you have with your health care provider.

## 2014-07-16 NOTE — Telephone Encounter (Signed)
-----   Message from [name] sent at 07/16/2014 12:50 PM EDT -----  Regarding: sick call - dizziness      Deborah [name] [id number], 49 year old, female, Telephone Information:   Home Phone      [phone]  Work Phone      Not on file.  Mobile          [phone]        Call back #  [phone]    Interpreter: no    Person Calling: patient    Reason for todays call?: sick  call      Sick call: dizziness    Symptoms: vomiting    How long have you been sick?:  Starting this morning        Reason to speak with a nurse only?    Reason to speak with a provider only?        Other call: Reason for call?

## 2014-07-30 NOTE — Progress Notes (Signed)
Seen in ED

## 2014-10-20 ENCOUNTER — Ambulatory Visit (HOSPITAL_BASED_OUTPATIENT_CLINIC_OR_DEPARTMENT_OTHER): Payer: Medicaid Other | Admitting: Gastroenterology

## 2014-10-20 ENCOUNTER — Encounter (HOSPITAL_BASED_OUTPATIENT_CLINIC_OR_DEPARTMENT_OTHER): Payer: Self-pay | Admitting: Gastroenterology

## 2014-10-20 VITALS — BP 107/67 | HR 71 | Wt 165.0 lb

## 2014-10-20 DIAGNOSIS — R195 Other fecal abnormalities: Secondary | ICD-10-CM

## 2014-10-20 MED ORDER — PEG 3350-KCL-NABCB-NACL-NASULF 236 G PO SOLR
4.00 L | Freq: Once | ORAL | 0 refills | Status: AC
Start: 2014-10-20 — End: 2014-10-20

## 2014-10-20 NOTE — Progress Notes (Cosign Needed)
CC: [name] is referred by Dr. Malva Limes, MD for an opinion on positive fecal occult blood    Please see the note from PCP.       HPI: Deborah [name] is a 49 year old female who presents to the clinic with positive fecal occult blood test. She also has had symptoms of GERD for 6 years and has been off and on omeprazole for that amount of time. She says it helps a little, but does not completely resolve symptoms. Her symptoms are characterized as epigastric abdominal pain. She denies hematochezia, melena, diarrhea, constipation, nausea, vomiting, dysphagia/odynophagia, unexplained weight loss, decreased appetite/early satiety.  Patient denies NSAID, ASA, tobacco, or EtOH use.  Patient denies family history of colon cancer or polyps.      Of note, she has an EGD in 2014 with Dr. Owens Shark at [facility]     Past Medical Hx:  Patient Active Problem List:     Lump or mass in breast     Gastroesophageal reflux disease without esophagitis     Other specified gastritis     Abdominal pain, epigastric     Angioneurotic edema not elsewhere classified     Plantar Fasciitis, L     Leg pain     Tobacco abuse, in remission     Triggering of Finger, R 3rd     Right subacromial bursitis/rotator cuff tendinopathy     Chondromalacia patellae     Lower back pain     De Quervain's Tenosynovitis, R     CTS (Carpal Tunnel Syndrome), b/l     Family history of diabetes mellitus (DM)     Hypolipoproteinemia     Intermittent spinal claudication     Vitamin D deficiency     Greater Trochanteric Bursitis, b/l     DDD (degenerative disc disease), lumbar     Obesity     SVT To 240 BPM March 2010     Palpitations     Rapid Noctural Palpitations x 10 Seconds     Sweating     Microscopic hematuria     Reflux esophagitis     Lump of skin of lower extremity     Wheezing     Bilateral knee pain     Bilateral arm pain     Positive occult stool blood test      Past Surgical Hx:    Past Surgical  History    [place] BREAST BIOPSY SPECIMEN      Comment s/p fibroadenoma on the L,     SEPTOPLASTY/SUBMUCOUS RESECJ W/WO CARTILAGE GRF      CESAREAN DELIVERY ONLY      Comment breech       Review of Patient's Allergies indicates:   Naproxen                Swelling    Comment:Ed visit with angioedema   Ibuprofen               Other (See Comments)    Comment:Epigastric pain, throat swelling?             05/06/12 pt states uses sometimes without issue   Pollen extract-tree*    Runny Nose    Comment:HA, itchy watery eyes    Social Hx  Smoking status: Former Smoker  Packs/day: 0.50      Years: 27.00        Types: Cigarettes     Quit date: 06/28/2002  Smokeless status: Never Used                      Alcohol use: No                  Current Outpatient Prescriptions:  Calcium Carb-Cholecalciferol 600-500 MG-UNIT CAPS Take  by mouth. Disp:  Rfl:    cholecalciferol (VITAMIN D3) 2000 UNIT tablet Take 1 tablet by mouth daily. Disp: 30 tablet Rfl: 11   OTHER MEDICATION counterforce brace for L arm Disp:  Rfl:    metoprolol (TOPROL-XL) 50 MG 24 hr tablet TAKE 1 TABLET BY MOUTH DAILY. Disp: 30 tablet Rfl: 11   omeprazole (PRILOSEC) 20 MG capsule Take 1 capsule by mouth daily. Pt states OTC and not sure of dose. Disp: 30 capsule Rfl: 11     No current facility-administered medications for this visit.     Family Hx:    Family History    Hypertension Mother     Hypertension Father     Diabetes Mother     Diabetes Maternal Uncle     Diabetes Maternal Uncle     Cancer - Other FamHxNeg     Cancer - Breast FamHxNeg     Cancer - Colon FamHxNeg     Cancer - Lung FamHxNeg     Cancer - Ovarian FamHxNeg     Heart Maternal Grandmother     Heart Maternal Uncle     Heart Maternal Uncle        REVIEW OF SYSTEMS:    See HPI. All other systems reviewed and negative.     Physical Exam:  Vital Signs: BP 107/67  Pulse 71  Wt 74.8 kg (165 lb)  LMP 10/17/2006  SpO2 100%  BMI 30.67 kg/m2 Body mass  index is 30.67 kg/(m^2).  General: NAD.   HEENT: Normocephalic, atraumatic. No scleral icterus.  Pulmonary: Clear to auscultation. No rales, wheezes or ronchi.  Cardiovascular: Normal rate and rhythm, S1 and S2 audible, no murmurs.    Abdominal: Normal bowel sounds. No tenderness on palpation. No hepatomegally or splenomegally.  Extremities: Without clubbing, cyanosis or edema.   Neurological: Normal gait.   Psych: Alert, oriented x 3. Normal affect and mood.  Derm: No rashes on exposed areas.       EGD 05/20/2012:  - Mildly severe reflux esophagitis.   - Gastric mucosal abnormality in the stomach characterized by erythema.   Path  STOMACH, ANTRUM, BIOPSY:  - MILD REACTIVE GASTROPATHY.  - NO H. PYLORI LIKE ORGANISMS ARE IDENTIFIED ON ROUTINE H&E EXAMINATION.    ESOPHAGUS, BIOPSY:  - GLANDULAR MUCOSA WITH MILD CHRONIC INFLAMMATION  - NO SQUAMOUS EPITHELIUM PRESENT FOR EVALUATION.  - SEE NOTE.    Assessment/Plan: Deborah [name] is a 49 year old female who presents to the clinic with positive fecal occult blood test.   - proceed with colonoscopy. Risks, benefits and alternatives discussed. Pt understands that risks include, but are not limited to, bleeding, perforation, drug reaction, infection, need for blood transfusion, need for surgery, and missing lesions. She understood these risks and consented to the exam.         I have spent 31 minutes in face to face time with this patient/patient proxy of which > 50% was in counseling or coordination of care regarding above issues/Dx.  Patient understands and agrees with the plan.

## 2014-10-20 NOTE — Progress Notes (Signed)
Pt feels safe at home

## 2014-10-24 NOTE — Progress Notes (Signed)
Deborah [name], personally interviewed, examined and reviewed the chart of this patient and formulated the plan.  Patient was seen together with Linna Caprice, please see her note from today's visit for the complete evaluation.    Pt with +iFOB.  EGD in 2014 with esophagitis and gastritis.    HEMATOCRIT (%)   Date Value   11/28/2012 36.7   11/09/2009 40.9   08/04/2009 37.2   ----------    Proceed with colonoscopy with moderate sedation.  This procedure has been fully reviewed with the patient and written informed consent has been obtained.  No current upper GI symptoms necessitating repeating EGD.    All questions answered.  Pt understands and agrees with plan.

## 2014-10-27 ENCOUNTER — Other Ambulatory Visit (HOSPITAL_BASED_OUTPATIENT_CLINIC_OR_DEPARTMENT_OTHER): Payer: Self-pay | Admitting: Internal Medicine

## 2014-10-27 ENCOUNTER — Other Ambulatory Visit (HOSPITAL_BASED_OUTPATIENT_CLINIC_OR_DEPARTMENT_OTHER): Payer: Self-pay | Admitting: Registered Nurse

## 2014-10-27 DIAGNOSIS — I471 Supraventricular tachycardia, unspecified: Secondary | ICD-10-CM

## 2014-10-27 MED ORDER — METOPROLOL SUCCINATE ER 50 MG PO TB24
50.0000 mg | ORAL_TABLET | Freq: Every day | ORAL | 11 refills | Status: DC
Start: 2014-10-27 — End: 2015-10-28

## 2014-10-27 NOTE — Progress Notes (Signed)
PER Pharmacy, Deborah [name] is a 49 year old female has requested a refill of metoprolol xl 50.      Last Office Visit: 06-30-14  Last Physical Exam: 06-30-14      HTN Med:    Most Recent BP Reading(s)  10/20/14 : 107/67  07/16/14 : 114/54  06/30/14 : 100/60      Documented patient preferred pharmacies:    [place] ([facility])  Phone: [phone] Fax: [phone]

## 2014-10-27 NOTE — Telephone Encounter (Signed)
-----   Message from Gerarda Gunther sent at 10/27/2014 11:05 AM EDT -----  Regarding: Dr. Jeral Pinch [id number], 49 year old, female, Telephone Information:   Home Phone      [phone]  Work Phone      Not on file.  Mobile          [phone]      Patient's Preferred Pharmacy:     [facility] ([facility])  Phone: [phone] Fax: [phone]      CONFIRMED TODAY: Christene Lye NUMBER: [phone]  Best time to call back: ANY  Cell phone:   Other phone:    Available times:    Patient's language of care: [place]    Patient does not need an interpreter.    Patient's PCP: Malva Limes, MD    Person calling on behalf of patient: Patient (self)    Calls today to speak to provider or a nurse, needs a new prescription for metoprolol (TOPROL-XL) 50 MG 24 hr tablet / presc expires tomorrow.

## 2014-11-26 ENCOUNTER — Other Ambulatory Visit (HOSPITAL_BASED_OUTPATIENT_CLINIC_OR_DEPARTMENT_OTHER): Payer: Self-pay | Admitting: Internal Medicine

## 2014-11-26 DIAGNOSIS — K219 Gastro-esophageal reflux disease without esophagitis: Secondary | ICD-10-CM

## 2014-11-26 NOTE — Progress Notes (Signed)
PER Pharmacy, Deborah [name] is a 49 year old female has requested a refill of Omeprazole 20mg .      Last Office Visit: 10/20/2014  Last Physical Exam: 06/30/2014      Other Med Adult:  Most Recent BP Reading(s)  10/20/14 : 107/67          CHOLESTEROL (mg/dL)   Date Value   10/08/2013 192   ----------    LOW DENSITY LIPOPROTEIN DIRECT (mg/dL)   Date Value   10/08/2013 129   ----------    HIGH DENSITY LIPOPROTEIN (mg/dL)   Date Value   10/08/2013 48   ----------    TRIGLYCERIDES (mg/dL)   Date Value   10/08/2013 223 (H)   ----------        THYROID SCREEN TSH REFLEX FT4 (uIU/mL)   Date Value   03/05/2012 2.73   ----------        TSH (THYROID STIM HORMONE) (uIU/mL)   Date Value   12/22/2010 1.31   ----------      HEMOGLOBIN A1C (%)   Date Value   06/30/2008 5.2   ----------        INR (no units)   Date Value   06/15/2008 1.0 (L)   ----------       Documented patient preferred pharmacies:    [place] ([facility])  Phone: [phone] Fax: [phone]    \

## 2015-01-16 ENCOUNTER — Emergency Department (HOSPITAL_BASED_OUTPATIENT_CLINIC_OR_DEPARTMENT_OTHER)
Admission: RE | Admit: 2015-01-16 | Disposition: A | Discharge status: Home / Self Care | Payer: Self-pay | Source: Emergency Department | Attending: Emergency Medicine | Admitting: Emergency Medicine

## 2015-01-16 ENCOUNTER — Encounter (HOSPITAL_BASED_OUTPATIENT_CLINIC_OR_DEPARTMENT_OTHER): Payer: Self-pay

## 2015-01-16 NOTE — Narrator Note (Signed)
Patient Disposition    Patient education for diagnosis, pain management, ice/elevate, crutch walking and aircast use and follow-up with PCP.  Return to ED for new, worsening or concerning symptoms.  Patient left ED 9:37 PM.  Patient received written instructions.  Interpreter to provide instructions: No    Patient belongings with patient: YES    Have all existing LDAs been addressed? N/A    Have all IV infusions been stopped? N/A    Discharged to: Discharged to home.  Pt ambulatory out of dept touch toe crutch walking with aircast on steadily.

## 2015-01-16 NOTE — Discharge Instructions (Signed)
Your ankle xrays were ok.    If you had a CT scan or X-Ray performed between the hours of 5 PM-7 AM your results are only preliminary. Final CT and X-Ray reports will be available through medical records within 24 hours. Sometimes incidental findings are found on your radiographs after you leave the ED that will require non-emergent follow up.    You may take Tylenol, 2 extra strength or 3 regular strength tablets, every 8 hours as needed for pain.  Do not take any other medications containing Tylenol (acetaminophen) with this.  Some cold and pain medications contain acetaminophen along with other ingredients, it is important to read the labels. Never take more than 3 doses of Tylenol within 24 hours as more will poison your liver.    Apply ice to the painful area for 15-20 minutes 3 times a day, don't put ice directly on your skin so you don't get frostbite.    Elevate your leg as much as possible to help with swelling.    Wear the aircast and use the crutches as needed for pain, you may put weight on your foot as long as it doesn't hurt.    Twice a day trace the ABCs into the air with the toes of your right foot to prevent your ankle from getting stiff.    Please read and follow the below instructions.        Ankle Sprain  An ankle sprain is an injury to the strong, fibrous tissues (ligaments) that hold your ankle bones together.   HOME CARE    Put ice on your ankle for 1-2 days or as told by your doctor.    Put ice in a plastic bag.    Place a towel between your skin and the bag.    Leave the ice on for 15-20 minutes at a time, every 2 hours while you are awake.   Only take medicine as told by your doctor.   Raise (elevate) your injured ankle above the level of your heart as much as possible for 2-3 days.   Use crutches if your doctor tells you to. Slowly put your own weight on the affected ankle. Use the crutches until you can walk without pain.   If you have a plaster splint:    Do not rest it on anything  harder than a pillow for 24 hours.    Do not put weight on it.    Do not get it wet.    Take it off to shower or bathe.   If given, use an elastic wrap or support stocking for support. Take the wrap off if your toes lose feeling (numb), tingle, or turn cold or blue.   If you have an air splint:    Add or let out air to make it comfortable.    Take it off at night and to shower and bathe.    Wiggle your toes and move your ankle up and down often while you are wearing it.  GET HELP IF:   You have rapidly increasing bruising or puffiness (swelling).   Your toes feel very cold.   You lose feeling in your foot.   Your medicine does not help your pain.  GET HELP RIGHT AWAY IF:    Your toes lose feeling (numb) or turn blue.   You have severe pain that is increasing.  MAKE SURE YOU:    Understand these instructions.   Will watch your condition.  Will get help right away if you are not doing well or get worse.     This information is not intended to replace advice given to you by your health care provider. Make sure you discuss any questions you have with your health care provider.     Document Released: 09/13/2007 Document Revised: 04/17/2014 Document Reviewed: 10/09/2011  Elsevier Interactive Patient Education [employer].

## 2015-01-16 NOTE — ED Triage Note (Signed)
Pt states approximately 2 hours ago she had lower back pain d/t sciatica and fell.  Pt c/o right ankle pain.  Pt denies striking head.  Pt does have an abrasion to right elbow.

## 2015-01-16 NOTE — ED Provider Notes (Signed)
The patient was seen primarily by me. ED nursing record was reviewed. Prior records as available electronically through the Epic record were reviewed.    HPI:    This 49 year old female patient with past medical history right-sided sciatica, SVT, GERD presents to the emergency department complaining of acute onset right ankle pain after a fall with a twisting injury.  Patient states that she fell because she had sudden onset of pain shooting down her right leg when she turned, felt to the ground, injuring the outside of her right ankle.  Patient states that she's been having somewhat increased low back pain recently with intermittent pain radiating down her right leg.  Denies any leg numbness or weakness.  No bowel or bladder dysfunction.  No fevers or chills.  When she fell, did not hit her head, no loss of consciousness.  Did hit her right elbow, superficial abrasion there.  Denies any new neck or back pain.  Has otherwise been well recently.  Took 3 Tylenol for the pain prior to presentation.      ROS: Pertinent positives were reviewed as per the HPI above.       Past Medical History/Problem list:    Past Medical History    Esophageal reflux     Irregular menstrual cycle     Pregnant state, incidental     Comment: c/s breech    SVT (supraventricular tachycardia) (HCC) 3/10    Comment: [place]     Patient Active Problem List:     Lump or mass in breast     Gastroesophageal reflux disease without esophagitis     Other specified gastritis     Abdominal pain, epigastric     Angioneurotic edema not elsewhere classified     Plantar Fasciitis, L     Leg pain     Tobacco abuse, in remission     Triggering of Finger, R 3rd     Right subacromial bursitis/rotator cuff tendinopathy     Chondromalacia patellae     Lower back pain     De Quervain's Tenosynovitis, R     CTS (Carpal Tunnel Syndrome), b/l     Family history of diabetes mellitus (DM)     Hypolipoproteinemia     Intermittent spinal claudication (HCC)     Vitamin D  deficiency     Greater Trochanteric Bursitis, b/l     DDD (degenerative disc disease), lumbar     Obesity     SVT To 240 BPM March 2010     Palpitations     Rapid Noctural Palpitations x 10 Seconds     Sweating     Microscopic hematuria     Reflux esophagitis     Lump of skin of lower extremity     Wheezing     Bilateral knee pain     Bilateral arm pain     Positive occult stool blood test      Past Obstetrical/Gynecologic History: Patient's last menstrual period was 10/17/2006.     Past Surgical History:   Past Surgical History    [place] BREAST BIOPSY SPECIMEN      Comment s/p fibroadenoma on the L,     SEPTOPLASTY/SUBMUCOUS RESECJ W/WO CARTILAGE GRF      CESAREAN DELIVERY ONLY      Comment breech       Medications:   No current facility-administered medications for this encounter.   Current Outpatient Prescriptions:  omeprazole (PRILOSEC) 20 MG capsule TAKE 1 CAPSULE BY  MOUTH DAILY. Disp: 30 capsule Rfl: 11   metoprolol (TOPROL-XL) 50 MG 24 hr tablet Take 1 tablet by mouth daily Disp: 30 tablet Rfl: 11   Calcium Carb-Cholecalciferol 600-500 MG-UNIT CAPS Take  by mouth. Disp:  Rfl:    cholecalciferol (VITAMIN D3) 2000 UNIT tablet Take 1 tablet by mouth daily. 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 To [place] 2000.     Lives with her son and her parents. Divorced from husband.    Denies DV. No guns in home.     The patient lives at home.   Tobacco: former  EtOH: no  Street drugs: no    Family History: noncontrib    Allergies:  Review of Patient's Allergies indicates:   Naproxen                Swelling    Comment:Ed visit with angioedema   Ibuprofen               Other (See Comments)    Comment:Epigastric pain, throat swelling?             05/06/12 pt states uses sometimes without issue   Pollen extract-tree*    Runny Nose    Comment:HA, itchy watery eyes    Physical Exam:  Patient Vitals for the past 720 hrs:   Temp Pulse Resp BP SpO2   01/16/15 2030 98.3 F 106 16 137/95 100 %       GENERAL:  WDWN, no acute distress, non-toxic   SKIN:  Warm & Dry, no rash, no petechia.  HEAD:  NCAT. S  NECK:  No meningismus. No stridor.  EXTREMITIES:  No obvious deformities.  Moderate swelling and tenderness to palpation of the right lateral ankle, no other bony point tenderness of the right foot.  Normal range of motion of the right knee, no tenderness at the fibular head.  2+ DP pulse on the right.  Warm and well perfused.  No calf tenderness. No cyanosis, clubbing, or edema.   Back: No midline CT LS-spine tenderness, crepitus, or step-offs.  Moderate tenderness to palpation of the right lumbar musculature  NEUROLOGIC:  Alert and oriented x4; moves all extremities well; speaking in clear fluent sentences.  Normal motor and sensation of the bilateral legs.  2+ knee jerks bilaterally.  PSYCHIATRIC:  Appropriate for age, time of day, and situation    Results:  Imaging:  R ankle XR: unremarkable on my  read      ED Course and Medical Decision-making:  49 year old female patient presenting w/ R lateral ankle pain/swelling s/p twisting injury when she fell due to episode of stabbing pain radiating from back down right leg, similar to prior episodes of sciatica.    VS notable for mild tachycardia, otherwise benign. O2 sat normal on RA. PE notable for  mod ttp/swelling R lateral malleolus, foot NVI, no other bony point tenderness of R foot/ankle. 2+ knee jerks bilat, normal motor/sensation both legs, no midline spine ttp. Mild R elbow abrasion w/o bony point ttp, nl elbow ROM.     R ankle xrays unremarkable on my read, presentation c/w ankle sprain. Pt given aircast, crutches. Advised to take tylenol for pain, ice and elevate. In terms of back pain, appears c/w sciatica, no red flag sx/findings to suggest more sever process such as cauda equina syndrome or spinal epidural abscess. Pt advised to treat w/ tylenol and ice as well, f/u w/ PCP for possible referral to PT.    Reasons to return to the ED were reviewed in detail. The patient agrees with this plan and disposition.      Condition on Discharge: Improved and Stable    Diagnosis/Diagnoses:  Sprain of right ankle, unspecified ligament, initial encounter  Sciatica of right side      Michel Santee, MD        This Emergency Department patient encounter note was created using voice-recognition software and in real time during the ED visit. Please excuse any typographical errors that have not been edited out.

## 2015-01-17 LAB — XR ANKLE RIGHT MINIMUM 3 VIEWS

## 2015-01-27 ENCOUNTER — Encounter (HOSPITAL_BASED_OUTPATIENT_CLINIC_OR_DEPARTMENT_OTHER): Payer: Self-pay

## 2015-01-27 ENCOUNTER — Ambulatory Visit (HOSPITAL_BASED_OUTPATIENT_CLINIC_OR_DEPARTMENT_OTHER): Payer: No Typology Code available for payment source

## 2015-01-27 VITALS — BP 100/80 | HR 98 | Temp 97.8°F

## 2015-01-27 DIAGNOSIS — M5441 Lumbago with sciatica, right side: Secondary | ICD-10-CM

## 2015-01-27 DIAGNOSIS — S93401D Sprain of unspecified ligament of right ankle, subsequent encounter: Secondary | ICD-10-CM

## 2015-01-27 NOTE — Progress Notes (Signed)
S:  49 yo womanwith pain in low back for about 20 days now.  Pain travels into her R butt and down the back of the R leg to popliteal area  Has had this in the past but it has been about 2 years.  That time it got better with massage and Tylenol.    After she started to have this pain, she slipped and twisted her R ankle.  Now has pain inboth places.      Was given an air cast for this.        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 To [place] 2000.    Lives with her son and her parents. Divorced from husband.    Denies DV. No guns in home.       PE:  BP 100/80  Pulse 98  Temp 97.8 F (36.6 C) (Temporal)  LMP 10/17/2006  SpO2 97%  General:  Appears well NAD  Skin:  No rash, no lesions  Ext:  Mild swelling R ankle withtenderness on palp over R malleolus, decreased ROM at R hip and ankle, pain withRhip flexion  Neuro:  Limping, Motor 5/5 but withpain on R hip flexion  Psych:  Full affect, no agitation      A/P:  49 yo woman    (M54.41) Acute right-sided low back pain with right-sided sciatica   (primary encounter diagnosis)  Comment: Pain in R low back that radiates down posterior R thigh to popliteal area.  Has had simlar epsidoe several years ago.  And this improved withmassage and Tylenol  Plan: REFERRAL TO PHYSICAL THERAPY ( INT)        Trying this same strategy again    (S93.401D) Sprain of right ankle, unspecified ligament, subsequent encounter  Comment: Injured her ankle after the back/ sciatic pain started.  Still tender over lateral malleolus, but bearing weightwell  Plan: Continue with air cast, icing.

## 2015-03-15 ENCOUNTER — Ambulatory Visit (HOSPITAL_BASED_OUTPATIENT_CLINIC_OR_DEPARTMENT_OTHER): Payer: No Typology Code available for payment source | Admitting: Rehabilitative and Restorative Service Providers"

## 2015-03-15 DIAGNOSIS — G8929 Other chronic pain: Secondary | ICD-10-CM

## 2015-03-15 DIAGNOSIS — M5441 Lumbago with sciatica, right side: Secondary | ICD-10-CM

## 2015-03-15 HISTORY — DX: Other chronic pain: G89.29

## 2015-03-15 HISTORY — DX: Lumbago with sciatica, right side: M54.41

## 2015-03-15 NOTE — Progress Notes (Signed)
I certify that the documented Treatment Plan is reasonable and necessary.    03/15/2015  Peyton Najjar, MD

## 2015-03-15 NOTE — Progress Notes (Signed)
OUTPATIENT PHYSICAL THERAPY EVALUATION    Referring Physician: Peyton Najjar, MD    Subjective: Pt is a 49 y/o female who presents to physical therapy with R sided back pain. Pt reports she has had this pain for a long time, but as of 5 months ago the pain has started to travel from the R side of her back down her R leg just to her knee. Pt reports that about 2 months ago she had a severe shock of pain on her R leg and caused her to fall and twist her ankle. Pt reports that following that incident she visited the doctor because of her foot. Pt reports that her pain is mostly on the R side shooting down to her knee (posterior thigh), but occasionally she feels discomfort in her L thigh as well. Pt reports her pain feels like pressure on the R side, and sharp pain on the L. Pt reports aggravating factors of stairs, bending forward, and standing for too long. Pt reports alleviating factors of tylenol, and ice. Pt reports current pain levels 5/10 on VAS. Pt denies n/t, b/b, saddle anesthesia.     Date of Onset: 10/2014  Pain:     At worse: 5/10   At best: 5/10  Pre-morbid Functional Level: Independent, unrestricted with activities such as housecleaning  Functional Deficits: Lifting, squatting, bending forward, prolonged standing, stairs    Occupation: Housecleaner  Dressing/Grooming: Independent, but when pain is severe cannot tie shoes  Driving: Cannot drive for a long period because the sitting bothers her  Sleeping: Awakes several times a night due to the pain (2-3x)      Contraindications/Precautions: Yes- HTN    PMH: GERD, HTN    Medications: Omeprazole, Metoprolol, Cholecalciferol      Mental Status/Communication: WNL  Learns Best: Verbal  Primary Language: [place]; Requires Interpreter: Yes     Objective:      03/15/15 0900   Language Information   Language of Care Portuguese   Interpreter Yes   Evaluation Type   Evaluation Type Initial Evaluation   Rehab Discipline   Rehab Discipline PT   Visit   Visit number 1    POC Due date 04/26/2015   Pain   Pain Score 5    Pain Type Chronic pain   Pain Location Back;Leg   Pain Orientation Right;Posterior   Pain Radiating Towards R posterior buttock, posterior thigh to posterior knee   Pain Descriptors Pressure   Precautions   Precautions Yes   Other   Other (Comments) HTN    Patient Stated Goals   Patient stated goals decrease symptoms   Living Situation   Lives With [facility]   Exterior Home Access Steps   Posture   Anterior Pelvic Tilt  Minimal   Posture assessment X   Spine Assessment   Spine Assessment Lumbar   Lumbar Assessment   Extension 70%, R back pain   Flexion 40% radiating pain   L Rotation 70% R sided back pain   R Rotation 70%   L Side Bend 70% R pain   R Side Bend 70%   LLE Assessment   LLE Assessment X   PROM LLE (degrees)   L Hip Flexion 0-125 100 Degrees   L Hip Extension 0-30 15 Degrees   L Hip ABduction 0-45 45 Degrees   L Hip Internal Rotation 0-35 35 Degrees   L Hip External Rotation 0-45 45 Degrees   L Knee Flexion 0-140 120 Degrees   L Knee Extension  0-130 0 Degrees   Strength LLE   L Hip Flexion 4/5   L Hip Extension 4-/5   L Hip ABduction 4/5   L Hip ADduction 4/5   L Hip External Rotation 4/5   L Knee Flexion 4/5   L Knee Extension 4/5   LLE Muscle Length Assessment   Overall Muscle Length moderate restrictions of HS, piriformis, iliopsoas   RLE Assessment   RLE Assessment X   PROM RLE (degrees)   R Hip Flexion 0-125 100 Degrees   R Hip Extension 0-30 15 Degrees   R Hip ABduction 0-45 45 Degrees   R Hip Internal Rotation 0-35 35 Degrees   R Hip External Rotation 0-45 45 Degrees   R Knee Flexion 0-140 120 Degrees   R Knee Extension 0-130 0 Degrees   Strength RLE   R Hip Flexion 4-/5   R Hip Extension 4-/5   R Hip ABduction 4-/5   R Hip ADduction 4-/5   R Hip External Rotation 4/5   R Knee Extension 4/5   R Knee Flexion 4/5   RLE Muscle Length Assessment   Overall Muscle Length  moderate restrictions of HS, iliopsoas, piriformis   Clinical Special Tests   Special  Tests Yes   Lumbar/Sacroiliac results   L Slump Negative   R Slump Positive   L SLR  Negative   R SLR  Positive   Distraction  Negative   Sensation   Light Touch No apparent deficits   Proprioception   Proprioception deficits? No apparent deficits   Proofreader Observation WFL   Functional Mobility   Gait increased pelvic rotation (B), decreased (B) hip extension   Palpation   Tenderness to Palpation TTP R lumbar paraspinals, R L5 UPA   Spine Joint Mobility (0-6 scale)   Spine Mobility assessment  Yes   Lumbar   Passive Accessory Intervertebral Movement (PAIVM) 2/6  (pain C PA L5, R UPA)   Patient Education   What was taught? Role of PT, examination findings, HEP   Method Verbal;Demo;Practice;Written   Patient comprehension Yes     Physical Therapy Plan of Care    [id number] Pershing Cox, MD  Referring Provider: Peyton Najjar, MD  Diagnosis: Chronic right-sided low back pain with right-sided sciatica  (primary encounter diagnosis)    Assessment/Objective Findings:   Patient is a 49 year old female with complains of pain in her right Lumbar spine and posterior R leg.     Pt reports onset of pain beginning several years ago with increased in pain as of 5 months ago  due to insidious onset. 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 Pt expresses long term goal of decreasing symptoms, is motivated to work towards this in PT.    Clinical presentation today is consistent with chronic R sided low back pain with R-sided sciatica and pt will benefit from skilled PT focused on core stabilization, proximal hip strength, extension based exercises, manual/mechanical traction, LE stretching, lumbar stretches, stair training, gait training, joint mobilizations (lumbar and hip), HEP, pt education, STM to address the following problems and impairments noted upon evaluation: Pain, Decreased ROM, Decreased Strength,  Decreased Functional Mobility, Decreased Joint Mobility and Decreased Tolerance of ADLs.     These problems limit the patient with the following functional activities: prolong postures, squatting, lifting, stair negotiation, sleeping. The prescribed treatment plan of care is medically necessary.  Co-morbidities of HTN, GERD were identified and taken into considerations of plan of care.      Short Term Functional Goals:  Pt will be independent with HEP in 3 weeks.  Pt will report centralization of symptoms in 4 weeks.  Pt will demonstrate 4/5 R proximal hip strength during MMT in 4 weeks.  Pt will report decrease in average pain levels to <3/10 in 4 weeks.  Pt will demonstrate improvement in muscle length to mild restrictions of R piriformis in 4 weeks.    Long Term Goal:  Pt will be able to sleep through the night without disturbances due to pain in 6-8 weeks.  Pt will be able to bend forward to tie her shoes without pain in 6-8 weeks.  Pt will be able to ascend and descend 10 stairs without pain in 6-8 weeks.  Pt will be able to squat and lift 20# with correct body mechanics in 6-8 weeks.  Pt will be able to independently manage onset of symptoms with HEP in 6-8 weeks.    Treatment Plan:  ** PT Eval (CPT 97001)  ** Stretching/ROM Exercise [phone])  ** Therapeutic Exercise (CPT 97110)  ** Home Exercise Program (CPT [phone])  ** Neuromuscular Re-education (CPT [id number])  ** Joint Mobilization (CPT 97140)  ** Soft Tissue Mobilization (CPT 97140)  ** Hot/Cold Rx (CPT 97010)  ** Manual Traction (CPT 97140)  ** Mechanical Traction (CPT [id number])  ** Patient Education (CPT [id number])    Recommend Physical Therapy be continued 2 times per week for 6 weeks.  The rehabilitation potential for this patient is good    Patient Regis Bill is aware of attendance policy: Yes  Plan of care discussed with Patient/Family: Yes  Patient goals reviewed and incorporated in plan of care: Yes  Patient/Family agrees with plan of care:  Yes  Patient/Family education: Yes  Does patient feel safe at home: Yes      Jeneen Rinks, PT, DPT, CSCS

## 2015-03-23 ENCOUNTER — Ambulatory Visit (HOSPITAL_BASED_OUTPATIENT_CLINIC_OR_DEPARTMENT_OTHER): Payer: No Typology Code available for payment source | Admitting: Physical Therapy

## 2015-03-23 DIAGNOSIS — M5441 Lumbago with sciatica, right side: Secondary | ICD-10-CM

## 2015-03-29 ENCOUNTER — Ambulatory Visit (HOSPITAL_BASED_OUTPATIENT_CLINIC_OR_DEPARTMENT_OTHER): Payer: No Typology Code available for payment source | Admitting: Rehabilitative and Restorative Service Providers"

## 2015-03-31 ENCOUNTER — Ambulatory Visit (HOSPITAL_BASED_OUTPATIENT_CLINIC_OR_DEPARTMENT_OTHER): Payer: No Typology Code available for payment source | Admitting: Rehabilitative and Restorative Service Providers"

## 2015-03-31 DIAGNOSIS — G8929 Other chronic pain: Secondary | ICD-10-CM

## 2015-03-31 DIAGNOSIS — M5441 Lumbago with sciatica, right side: Principal | ICD-10-CM

## 2015-03-31 NOTE — Progress Notes (Signed)
S: Pt reports that she has neck pain and it feels like there are 100 pounds sitting on her neck. Pt reports that her back and leg pain is feeling better and she believes the massage helped her.  O: Refer to Rehabilitation Treatment Flowsheet  A: Pt is able to tolerate all treatment today without discomfort. Pt demonstrates increased muscle length restrictions of R piriformis and quadriceps compared to L. Pt requires education on log roll technique due to poor demonstration of supine to sit. Demonstrates correct technique after demonstration.  P: Continue with current POC and advance pt as tolerated.       03/31/15 0700   Language Information   Language of Care English   Interpreter No   Precautions   Precautions Yes   Other   Other (Comments) HTN   Rehab Discipline   Rehab Discipline PT   Visit   Visit number 3   POC Due date 04/26/2015   Pain   Pain Score 4    Manual Therapy   Technique manual traction x 8 min    Manual Therapy 2   Technique STM to piriformis and HS,x 62min   Manual Therapy 3   Technique massage to HS with magic stick x 5 min    Ther Exercise   Exercise (B) clamshells, 2x10 ea   Ther Exercise 2   Exercise TrA bracing with single knee fall outs, 2x10 ea   Ther Exercise 3   Exercise 3 manual piriformis, quad stretch, 2x30" (B)   Ther Exercise 4   Exercise 4 manual HS nerve flossing, x10 ea   Patient Education   What was taught? cont with HEP   Method Verbal   Patient comprehension Yes

## 2015-04-13 ENCOUNTER — Ambulatory Visit (HOSPITAL_BASED_OUTPATIENT_CLINIC_OR_DEPARTMENT_OTHER): Payer: No Typology Code available for payment source | Admitting: Rehabilitative and Restorative Service Providers"

## 2015-04-13 DIAGNOSIS — M5441 Lumbago with sciatica, right side: Principal | ICD-10-CM

## 2015-04-13 DIAGNOSIS — G8929 Other chronic pain: Secondary | ICD-10-CM

## 2015-04-13 NOTE — Progress Notes (Signed)
S: Pt reports that her R leg has been feeling much better but now the back of her L leg is bothering her. She says it is not as bad as her R leg used to be.  O: Refer to Rehabilitation Treatment Flowsheet  A: Pt is able to tolerate exercise progression without reports of pain or discomfort. Pt reports decreased pain levels at the end of session. Pt demonstrates (B) HS restrictions therefore pt encouraged to perform HS flossing technique as part of HEP.  P: Continue with current POC and advance pt as tolerated.       04/13/15 1800   Precautions   Precautions Yes   Other   Other (Comments) HTN   Rehab Discipline   Rehab Discipline PT   Visit   Visit number 4   POC Due date 04/26/2015   Pain   Pain Score 3    Manual Therapy   Technique manual traction x 8 min    Manual Therapy 3   Technique massage to HS with magic stick x 5 min    Ther Exercise   Exercise (B) clamshells, 2x10 ea   Ther Exercise 2   Exercise TrA bracing with single knee fall outs, 2x10 ea   Ther Exercise 3   Exercise 3 manual piriformis, quad stretch, 2x30" (B)   Ther Exercise 4   Exercise 4 manual HS nerve flossing, x10 ea   Ther Exercise 5   Exercise 5 bridges, 2x10x3" hold   Ther Exercise 6   Exercise 6 h/l add ball squeezes, 2x10x5" hold   Patient Education   What was taught? cont with HEP   Method Verbal   Patient comprehension Yes

## 2015-04-19 ENCOUNTER — Ambulatory Visit (HOSPITAL_BASED_OUTPATIENT_CLINIC_OR_DEPARTMENT_OTHER): Payer: No Typology Code available for payment source | Admitting: Rehabilitative and Restorative Service Providers"

## 2015-04-20 NOTE — Progress Notes (Signed)
03/23/15 1200   Language Information   Language of Care Portuguese   Interpreter No   Precautions   Precautions Yes   Other   Other (Comments) HTN    Rehab Discipline   Rehab Discipline PT   Visit   Visit number 2   POC Due date 04/26/2015   Pain   Pain Score 5    Manual Therapy   Technique manual traction x 8 min    Manual Therapy 2   Technique STM to piriformis and HS,x    Manual Therapy 3   Technique massage to HS with magic stick x 5 min    Ther Exercise   Exercise TRA bracing hold 3 sec 1x10   Ther Exercise 2   Exercise manual piriformis stretch 20 sec x 2    Patient Education   What was taught? cont with tra bracing at home    Method Verbal;Demo;Practice   Patient comprehension Yes

## 2015-04-20 NOTE — Progress Notes (Signed)
.  S: Pt report 5/10 pain level.   O: Refer to Rehabilitation Treatment Flowsheet  A: Pt reported some decrease of pain after manual therapy. Pt tolerated RX well. Recommendation to cont with TRA bracing at home. Pt verbalized and demonstrated understanding.   P: Cont with PT per POC.

## 2015-04-21 ENCOUNTER — Ambulatory Visit (HOSPITAL_BASED_OUTPATIENT_CLINIC_OR_DEPARTMENT_OTHER): Payer: No Typology Code available for payment source | Admitting: Physical Therapy

## 2015-05-03 ENCOUNTER — Ambulatory Visit (HOSPITAL_BASED_OUTPATIENT_CLINIC_OR_DEPARTMENT_OTHER): Payer: No Typology Code available for payment source | Admitting: Rehabilitative and Restorative Service Providers"

## 2015-05-03 DIAGNOSIS — G8929 Other chronic pain: Secondary | ICD-10-CM

## 2015-05-03 DIAGNOSIS — M5441 Lumbago with sciatica, right side: Secondary | ICD-10-CM

## 2015-05-03 NOTE — Progress Notes (Signed)
S: Pt reports that she was feeling better, but she started to feel pain this past Friday in her low back. Pt says she climbed a lot of stairs which may have contributed to her LBP.  O: Refer to Rehabilitation Treatment Flowsheet  A: Pt is able to tolerate all exercises without reports of pain or discomfort. Pt demonstrates muscle fatigue with wall slides and lateral walks. Pt encouraged to continue with HEP daily to prevent flair ups of symptoms. Pt understands.  P: Continue with current POC and advance pt as tolerated. Plan to d/c pt at next visit to HEP.       05/03/15 0900   Language Information   Language of Care English   Interpreter No   Precautions   Precautions Yes   Other   Other (Comments) HTN   Rehab Discipline   Rehab Discipline PT   Visit   Visit number 5   POC Due date d/c at next visit   Time Calculation   Start Time 0930   Stop Time 1000   Time Calculation (min) 30 min   Pain   Pain Score 2    Manual Therapy   Technique manual traction x 8 min    Ther Exercise   Exercise (B) clamshells, 2x10 ea   Ther Exercise 2   Exercise wall slides with TrA bracing, 2x10   Ther Exercise 3   Exercise 3 LTR, 3x10   Ther Exercise 4   Exercise 4 lateral walk, 1 laps, YTB   Ther Exercise 5   Exercise 5 bridges, 2x10x3" hold   Ther Exercise 6   Exercise 6 childs pose, 2x10x3" hold   Ther Exercise 7   Exercise 7 cat/camel   Reps 7 10   Sets 7 2   Patient Education   What was taught? cont with HEP   Method Verbal   Patient comprehension Yes

## 2015-05-04 ENCOUNTER — Encounter (HOSPITAL_BASED_OUTPATIENT_CLINIC_OR_DEPARTMENT_OTHER): Payer: Self-pay

## 2015-05-04 ENCOUNTER — Emergency Department (HOSPITAL_BASED_OUTPATIENT_CLINIC_OR_DEPARTMENT_OTHER)
Admission: RE | Admit: 2015-05-04 | Disposition: A | Discharge status: Home / Self Care | Payer: Self-pay | Source: Emergency Department | Attending: Emergency Medicine | Admitting: Emergency Medicine

## 2015-05-04 LAB — RAPID STREP (POINT OF CARE): STREP SCREEN: NEGATIVE

## 2015-05-04 MED ORDER — BENZOCAINE-MENTHOL 15-3.6 MG MT LOZG
1.00 | LOZENGE | Freq: Once | OROMUCOSAL | Status: AC
Start: 2015-05-04 — End: 2015-05-04
  Administered 2015-05-04: 3.6 mg via BUCCAL
  Filled 2015-05-04: qty 1

## 2015-05-04 MED ORDER — ACETAMINOPHEN 325 MG PO TABS
975.0000 mg | ORAL_TABLET | Freq: Once | ORAL | Status: AC
Start: 2015-05-04 — End: 2015-05-04
  Administered 2015-05-04: 975 mg via ORAL
  Filled 2015-05-04: qty 3

## 2015-05-04 MED ORDER — MENTHOL 3 MG MT LOZG
1.00 | LOZENGE | OROMUCOSAL | 0 refills | Status: AC | PRN
Start: 2015-05-04 — End: 2015-05-07

## 2015-05-04 NOTE — Narrator Note (Signed)
Patient Disposition    Patient education for diagnosis, medications, activity, diet and follow-up.  Patient left ED 2:11 PM.  Patient rep received written instructions.  Interpreter to provide instructions: No    Patient belongings with patient: YES    Have all existing LDAs been addressed? N/A    Have all IV infusions been stopped? N/A    Discharged to: Discharged to home

## 2015-05-04 NOTE — Discharge Instructions (Signed)
You likely have either influenza or an influenza-like virus.  This illness can last over a week, and you should treat symptoms such as pain, congestion, cough with over-the-counter remedies.      Return for any concern or worsening.  Ibuprofen 600-800 milligrams every 8 hours as needed.    Take this with food to protect your stomach, and stop taking it if it hurts your stomach.  Acetaminophen 1000 milligrams every 8 hours as needed.  Cepacol lozenges as needed for throat pain.  Rest, drink lots and lots of extra fluids.  It is okay to use over-the-counter cold and flu remedies as needed.  Just make sure that any over-the-counter medications do not also have ibuprofen or acetaminophen in them.  No work for 2-5 days, until you start to feel better.

## 2015-05-04 NOTE — ED Provider Notes (Signed)
[facility] Attending Note     History of Present Illness:    Deborah [name] is a 50 year old female who came to the ED complaining of yesterday onset of sore throat, "like cutting, glass", associated with fever, cough, generalized body aches, headache, and significant fatigue.  No chest pain or shortness of breath.  No vomiting, diarrhea or abdominal pain.    One year ago she had similar symptoms, especially with a sore throat and headache, at that time she had a strep infection.    Symptoms are moderate to severe and persist.  No other symptoms.  Headache is worse with coughing.    No relief with ibuprofen 800 mg this morning.   Masayo Hanif   MRN: [id number]  PCP: Malva Limes, MD    Arrived to the ED by: Relative    History provided by: the patient   Other: Reviewed nursing notes      Review of Systems:  As per HPI. All other systems were reviewed and are negative        Past Medical Hx:    Past Medical History    Esophageal reflux     Irregular menstrual cycle     Pregnant state, incidental     Comment: c/s breech    SVT (supraventricular tachycardia) (HCC) 3/10    Comment: TCH    Meds:    No current facility-administered medications for this encounter.   Current Outpatient Prescriptions:  omeprazole (PRILOSEC) 20 MG capsule TAKE 1 CAPSULE BY MOUTH DAILY. Disp: 30 capsule Rfl: 11   metoprolol (TOPROL-XL) 50 MG 24 hr tablet Take 1 tablet by mouth daily Disp: 30 tablet Rfl: 11   Calcium Carb-Cholecalciferol 600-500 MG-UNIT CAPS Take  by mouth. Disp:  Rfl:    cholecalciferol (VITAMIN D3) 2000 UNIT tablet Take 1 tablet by mouth daily. Disp: 30 tablet Rfl: 11         Past Surgical Hx:    Past Surgical History    [place] BREAST BIOPSY SPECIMEN      Comment s/p fibroadenoma on the L,     SEPTOPLASTY/SUBMUCOUS RESECJ W/WO CARTILAGE GRF      CESAREAN DELIVERY ONLY      Comment breech    Allergies:  Review of Patient's Allergies indicates:   Naproxen                Swelling    Comment:Ed visit with angioedema    Ibuprofen               Other (See Comments)    Comment:Epigastric pain, throat swelling?             05/06/12 pt states uses sometimes without issue   Pollen extract-tree*    Runny Nose    Comment:HA, itchy watery eyes   Social Hx:     Smoking status: Former Smoker  0.50 Packs/day  For 27.00 Years     Types: Cigarettes    Quit date: 06/28/2002    Smokeless tobacco: Never Used    Alcohol use No    Immunizations:  Immunization History   Administered Date(s) Administered    H1N1 0.37ml Intranasal 03/26/2008    INFLUENZA VIRUS TRI W/PRESV VACCINE 18/> YRS IM (PRIVATE) 02/18/2004, 02/13/2005, 03/26/2008, 02/27/2012    Influenza Virus Quad Presv Free Vacc 3/> Yrs IM 06/30/2014    Td 02/27/2012        Physical Examination:    ED Triage Vitals   Enc Vitals Group  BP 05/04/15 1201 132/71      Pulse 05/04/15 1201 76      Resp 05/04/15 1201 16      Temp 05/04/15 1201 98.2 F      Temp src --       SpO2 05/04/15 1201 100 %      Weight 05/04/15 1201 78.5 kg      Height --       Head Cir --       Peak Flow --       Pain Score 05/04/15 1201 8       Pain Loc --       Pain Edu? --       Excl. in [place]? --         General: Patient is tired-appearing in moderate acute distress, cooperative with exam    Eyes: PERRL, 4 mm bilaterally, EOMI, no conjunctival injection or scleral icterus.    Head, ears, nose, and throat:   Normocephalic.  Atraumatic.   Ears normal bilaterally.  Mild to moderate nasal congestion  Slightly dry mucous membranes. Normal phonation.  Posterior oropharynx without , edema, ulceration, with moderate erythema, symmetric  Uvula midline without edema.  Tonsils symmetric, without edema or exudate.  Airway widely patent.    Neck: Supple with painless full ROM. No lymphadenopathy.     Respiratory/chest: No respiratory distress, speaks in full sentences. Breath sounds are clear and equal bilaterally.  Occasional congested cough.    Cardiovascular: Well-perfused. Heart rate is regular, rate normal.  Capillary refill is  approximately 2 seconds    Gastrointestinal: Abdomen is soft.  No distension.  + bowel sounds.  No tenderness. No rebound.    Back: Grossly unremarkable    Musculoskeletal: Normal muscle tone, moving all extremities.  Gait is normal.    Skin: Warm and perfused.  No obvious rash.  No obvious erythema/ecchymosis.    Neurologic: Alert and oriented x 3, no focal deficits.    Psychiatric:  Appropriate affect for time of day, location, and circumstances.    Medications Given in the ED:    Medications   acetaminophen (TYLENOL) tablet 975 mg (975 mg Oral Given 05/04/15 1311)   benzocaine-menthol (CEPACOL) lozenge 3.6 mg (3.6 mg Buccal Given 05/04/15 1311)    Radiology and ECG:    No orders to display        Lab Results:    Labs Reviewed   THROAT CULTURE, BETA STREP    Narrative:     Source Details->throat   RAPID STREP (POINT OF CARE)    Vital Signs:     05/04/15  1201   BP: 132/71   Pulse: 76   Resp: 16   Temp: 98.2 F   SpO2: 100%   Weight: 78.5 kg (173 lb)        ED Course and Medical Decision Making:    Rapid strep negative    Flulike illness.  Acetaminophen and Cepacol lozenge given.  Patient works for herself, so no work note needed, but encouraged to stay out of work until she improves, expecting duration of 2-7 days.    Patient/family educated on their diagnosis, she verbalizes understanding and agrees with plan of care. She was told to follow up with her primary care physician. I reviewed with her reasons to return to the Emergency Department, all questions were answered.    ED Disposition:     Impression(s):  Flu-like symptoms  Pharyngitis, unspecified etiology    Disposition:  home  ED Prescriptions:  Discharge Medication List as of 05/04/2015  1:24 PM    START taking these medications    menthol-cetylpyridinium (CEPACOL) 3 MG lozenge  Take 1 lozenge by mouth as needed for Pain  Tamperproof, Disp-18 lozenge, R-0          Signed by Dr. Judson Roch, MD, [facility]  Emergency Medicine Attending  [facility]    This Emergency Department patient encounter note was created using voice-recognition software and in real time during the ED visit. Please excuse any typographical errors that have not been edited out.

## 2015-05-04 NOTE — ED Triage Note (Signed)
Pt c/o sore throat, fever, cough, generalized body aches and headache since yesterday.

## 2015-05-05 ENCOUNTER — Ambulatory Visit (HOSPITAL_BASED_OUTPATIENT_CLINIC_OR_DEPARTMENT_OTHER): Payer: No Typology Code available for payment source | Admitting: Rehabilitative and Restorative Service Providers"

## 2015-05-05 DIAGNOSIS — G8929 Other chronic pain: Secondary | ICD-10-CM

## 2015-05-05 DIAGNOSIS — M5441 Lumbago with sciatica, right side: Principal | ICD-10-CM

## 2015-05-05 NOTE — Progress Notes (Signed)
S: Pt reports that she feels much better overall. Pt reports that she was in bed a lot yesterday because she wasn't feeling well therefore she has some back soreness today. She reports the pain in her legs is gone and her back pain is very minimal.  O: Refer to Rehabilitation Treatment Flowsheet  A: Pt is able to tolerate all exercises without discomfort. Pt reports decreased pain levels, improved ROM, improved LE strength, and has returned to all activities. At this time, pt is being d/c'd to HEP.  P: D/c to HEP daily.       05/05/15 0800   Precautions   Precautions Yes   Other   Other (Comments) HTN   Rehab Discipline   Rehab Discipline PT   Visit   Visit number 6   POC Due date d/c today   Time Calculation   Start Time 0730   Stop Time 0755   Time Calculation (min) 25 min   Pain   Pain Score 2    Ther Exercise   Exercise (B) clamshells, 2x10 ea   Ther Exercise 2   Exercise sit to stand   Reps 2 10   Sets 2 2   Ther Exercise 3   Exercise 3 LTR, 3x10   Ther Exercise 4   Exercise 4 lateral walk, 1 laps, YTB   Ther Exercise 5   Exercise 5 bridges, 2x10x3" hold   Ther Exercise 6   Exercise 6 childs pose, 2x10x3" hold   Ther Exercise 7   Exercise 7 cat/camel   Reps 7 10   Sets 7 2   Ther Exercise 8   Exercise 8 hooklying marching   Reps 8 10   Sets 8 3   Patient Education   What was taught? updated HEP, d/c today   Method Verbal   Patient comprehension Yes     PHYSICAL THERAPY  DISCHARGE REPORT    Referring Provider: Peyton Najjar, MD  Diagnosis: Chronic right-sided low back pain with right-sided sciatica  (primary encounter diagnosis)    Your patient, Deborah [name], has been discharged from Physical Therapy after a total of 6 visits.    REASON(S) FOR DISCHARGE:    This patient has: met all goals. Pt reports decreased pain levels, no longer has radiating symptoms, demos improved ROM, improved LE strength, and has returned to PLOF. Pt is being d/c'd to HEP at this time.    RECOMMENDATIONS:    This  patient should: continue with HEP daily and contact her physician if any issues arise.    COMMENTS:    If you have any questions please feel free to contact the department at Dept: [phone].    Therapist: Jeneen Rinks, PT, DPT, CSCS

## 2015-05-06 LAB — THROAT CULTURE BETA STREP

## 2015-06-07 ENCOUNTER — Encounter (HOSPITAL_BASED_OUTPATIENT_CLINIC_OR_DEPARTMENT_OTHER): Payer: Self-pay | Admitting: Gastroenterology

## 2015-08-24 ENCOUNTER — Encounter (HOSPITAL_BASED_OUTPATIENT_CLINIC_OR_DEPARTMENT_OTHER): Payer: Self-pay | Admitting: Physician Assistant

## 2015-08-24 ENCOUNTER — Ambulatory Visit (HOSPITAL_BASED_OUTPATIENT_CLINIC_OR_DEPARTMENT_OTHER): Payer: No Typology Code available for payment source | Admitting: Physician Assistant

## 2015-08-24 VITALS — BP 106/76 | HR 76 | Temp 97.9°F | Resp 16 | Ht 61.5 in | Wt 178.0 lb

## 2015-08-24 DIAGNOSIS — K2970 Gastritis, unspecified, without bleeding: Secondary | ICD-10-CM

## 2015-08-24 DIAGNOSIS — K219 Gastro-esophageal reflux disease without esophagitis: Secondary | ICD-10-CM

## 2015-08-24 DIAGNOSIS — R42 Dizziness and giddiness: Secondary | ICD-10-CM

## 2015-08-24 DIAGNOSIS — K2990 Gastroduodenitis, unspecified, without bleeding: Secondary | ICD-10-CM

## 2015-08-24 MED ORDER — MECLIZINE HCL 25 MG PO TABS
25.00 mg | ORAL_TABLET | Freq: Three times a day (TID) | ORAL | 2 refills | Status: AC | PRN
Start: 2015-08-24 — End: 2016-08-23

## 2015-08-24 MED ORDER — OMEPRAZOLE 20 MG PO CPDR
20.0000 mg | DELAYED_RELEASE_CAPSULE | Freq: Two times a day (BID) | ORAL | 11 refills | Status: DC
Start: 2015-08-24 — End: 2017-03-13

## 2015-08-24 NOTE — Patient Instructions (Addendum)
Hrnia de Hiato    Uma hrnia de hiato ocorre quando uma parte do estmago desliza para cima do diafragma. O diafragma  o msculo fino que separa a barriga (abdmen) do peito. Uma hrnia de hiato pode ser de nascena ou pode ser desenvolvida ao longo do tempo. As hrnias de hiato podem permitir que o cido do estmago flua de volta para o esfago, o tubo que transporta os alimentos da boca [employer]. Se este cido provocar problemas  chamado de DRGE (doena do refluxo gastroesofgico).     SINTOMAS   O sintoma mais comum da DRGE  a azia (queimao no peito). Isto fica pior quando estiver deitado ou se curvando. Ela tambm pode causar arrotos e indigesto. Algumas das coisas que podem piorar a DRGE so:   Presso elevada no estmago fazendo com que o cido suba com mais facilidade.   Fumar aumenta acentuadamente a produo de cido.   O lcool diminui a presso no esfncter esofgico inferior (vlvula entre o estmago e o esfago), permitindo que o cido do estmago entre no esfago.   Refeies tarde da noite e ir para a cama com o estmago cheio aumentam a presso.   Qualquer coisa que provoque um aumento na produo de cido.   Insuficincia do esfncter esofgico inferior    DIAGNSTICO   A hrnia do hiato geralmente  diagnosticada com um raio-x do seu estmago e intestino delgado. Isso  chamado de GI Superior (raio-x do trato gastrointestinal superior). s vezes  feito uma gastroscopia. Este  um procedimento onde o seu mdico utiliza um instrumento flexvel para Administrator, sports dentro do estmago e do intestino delgado.    INSTRUES DE CUIDADOS DOMSTICOS    Tente alcanar e manter um peso corporal ideal.   Evite bebidas alcolicas.   Pare de fumar.   Levante a Medical laboratory scientific officer de 10 a 15 centmetros. Isto manter sua cabea e esfago mais altos do que seu estmago. Se voc no puder levantar a cabeceira da cama, durma com muitos travesseiros sob sua cabea e ombros.   Existem medicamentos  vendidos sem receita que podem diminuir a produo de cido. Seu mdico tambm pode prescrever medicamentos com este propsito. Tome conforme prescrito.   Tomar 1/2 a 1 colher de ch de anticido no perodo Office manager acordado, junto com as refeies e Conservation officer, nature, vai neutralizar o cido.   No tome aspirina, ibuprofeno (Advil ou Motrin) ou outros anti-inflamatrios no esteroides.   No use roupas apertadas em torno de seu peito ou estmago.   Coma pequenas refeies e coma com mais frequncia. Isto evita que o seu estmago fique muito cheio. Coma devagar.   No se deite durante 2 ou 3 horas depois de comer. No coma ou beba qualquer coisa de 1 a 2 horas antes de ir para a cama.   Evite bebidas com cafena (refrigerantes, caf, cacau, ch), alimentos gordurosos, frutas ctricas e todos os outros alimentos e bebidas que contenham cido e que possam aumentar os problemas.   Evite dobrar-se, especialmente depois de comer. Alm disso, evite fazer esforo durante as evacuaes ou ao urinar ou levantar coisas. Qualquer coisa que aumente a presso na sua barriga aumenta a quantidade de cido que pode ser empurrado para o seu esfago.    PROCURE ASSISTNCIA MDICA IMEDIATAMENTE SE:   No houver mudana no local de sua dor (dor nos braos, pescoo, mandbula, dentes ou costas), ou se a dor estiver piorando.   Voc tambm sentir nuseas, vmitos, sudorese (  diaforese), ou falta de ar.   Voc desenvolver vmito contnuo, vmito com sangue ou com colorao escura, houver sangue nas fezes, ou fezes escurecidas.  Alguns destes sintomas podem sinalizar outros problemas como doenas cardacas.    CERTIFIQUE-SE DE:    Entender estas instrues.   Monitorar sua condio.   Entrar em contato com seu mdico se voc no estiver melhorando ou se estiver piorando.  Documento Divulgado em: 12/12/2003 Documento Revisado em: 02/11/2012 Documento Avaliado em: 18/03/2005  ExitCare Informao ao Paciente [address],  [place].    Escolha de alimentos para a doena do refluxo gastroesofgico, adultos  (Food Choices for Gastroesophageal Reflux Disease, Adult)  Quando voc tem a doena do refluxo gastroesofgico (DRGE), os alimentos que ingere e seus hbitos alimentares so muito importantes. Escolher os alimentos certos pode ajudar a Contractor.  QUAIS SO AS ORIENTAES GERAIS QUE DEVO SEGUIR?   Escolha frutas, vegetais, gros integrais, laticnios com baixo teor de gordura e carne, peixe e aves com pouca gordura.   Limite a ingesto de [employer], molhos para [place], [place], nozes e abacate.   Faa um dirio alimentar para identificar os alimentos que causam os sintomas.   Evite alimentos que provoquem refluxo. Eles podem ser diferentes para pessoas distintas.   Faa refeies pequenas e frequentes em vez de trs grandes refeies ao dia.   Alimente-se com calma, em um ambiente tranquilo.   Limite os alimentos fritos.   Cozinhe os alimentos usando mtodos que no sejam a fritura.   Evite ingerir bebidas alcolicas.   Evite beber grandes quantidades de lquido com suas refeies.   Evite inclinar-se ou deitar at 2 a 3 horas aps comer.  QUAIS SO OS ALIMENTOS NO RECOMENDADOS?  Os seguintes alimentos e bebidas podem agravar os sintomas:  Legumes e verduras  Tomates. Suco de [place]. Molho de tomate e espaguete. Pimentas chili. [place] e alho. Raiz-forte.  Lambert Mody  Laranja, toranja e limo (fruta e suco).  Carnes  Carnes, peixes e aves com [place]. Elas incluem salsichas, costelas, presunto, salame e bacon.  Produtos lcteos  Leite integra e leite com chocolate. Creme de leite. Cremes. Manteiga. Sorvetes. Queijo cremoso.   Bebidas  Caf e ch, com ou sem cafeina. Bebidas carbonatadas ou bebidas energticas.  Condimentos  Molho apimentado. Molho barbecue.   Doces/Sobremesas  Chocolate e cacau. Donuts. Menta e hortel.  Gorduras e leos  Alimentos com alto teor de gordura, incluindo batata  frita e chips.  Outros  Vinagre. Temperos fortes como pimenta do reino, pimenta branca, pimenta vermelha, pimenta-caiena, curry, cravos, gengibre e chili em p.  Os itens listados acima podem no ser [facility] alimentos e bebidas a evitar. Entre em contato com seu nutricionista para mais informaes.     Estas informaes no se destinam a substituir as recomendaes de seu mdico. No deixe de discutir quaisquer dvidas com seu mdico.     Document Released: 03/27/2005 Document Revised: 04/17/2014  Elsevier Interactive Patient Education [employer].

## 2015-08-24 NOTE — Progress Notes (Signed)
Review of Systems   Constitutional: Negative for chills, fever and weight loss.   HENT: Negative for congestion, ear discharge, ear pain, hearing loss and tinnitus.    Eyes: Negative for blurred vision, double vision and photophobia.   Respiratory: Negative for cough and shortness of breath.    Cardiovascular: Negative for chest pain, palpitations and leg swelling.   Gastrointestinal: Positive for heartburn and nausea. Negative for abdominal pain, blood in stool, constipation, diarrhea, melena and vomiting.        Epigastric/lower chest pressure   Genitourinary: Negative for dysuria, frequency, hematuria and urgency.   Musculoskeletal: Negative for joint pain and myalgias.   Neurological: Positive for dizziness. Negative for tingling, tremors and headaches.            Subjective     Deborah [name] is a 50 year old female presents with burning in chest and throat x ~ 10-12 years intermittently.  Worse for past 4 months, bothersome even when drinking water., eating crackers.  Getting reflux of food into throat sometimes.  Reports epigastric/lower chest pressure with the reflux. Also reports burping, squeezing type of pain in chest and throat with heartburn.  Taking omeprazole prn, 2-3 times weekly (1-2 times daily). TUMS daily, brazilian tea minimally helpful.  Watching spicy food but eating a lot of spicy foods.  Has been nauseas, no vomiting, constipation, diarrhea, melena, hematochezia. + burping, some abdominal bloating. Last endoscopy 2014. Was scheduled for colonoscopy last year, didn't get. Missed last appt with GI.     H/o meniere's per pt, taking meclizine prn, ~ 3-4 time yearly, needs refill. Last attack 2 months ago. Not positional, seems equal bilat    Patient Active Problem List:     Lump or mass in breast     Gastroesophageal reflux disease without esophagitis     Other specified gastritis     Abdominal pain, epigastric     Angioneurotic edema not elsewhere classified     Plantar Fasciitis,  L     Leg pain     Tobacco abuse, in remission     Triggering of Finger, R 3rd     Right subacromial bursitis/rotator cuff tendinopathy     Chondromalacia patellae     Lower back pain     De Quervain's Tenosynovitis, R     CTS (Carpal Tunnel Syndrome), b/l     Family history of diabetes mellitus (DM)     Hypolipoproteinemia     Intermittent spinal claudication (HCC)     Vitamin D deficiency     Greater Trochanteric Bursitis, b/l     DDD (degenerative disc disease), lumbar     Obesity     SVT To 240 BPM March 2010     Palpitations     Rapid Noctural Palpitations x 10 Seconds     Sweating     Microscopic hematuria     Reflux esophagitis     Lump of skin of lower extremity     Wheezing     Bilateral knee pain     Bilateral arm pain     Positive occult stool blood test     Chronic right-sided low back pain with right-sided sciatica      Current Outpatient Prescriptions:  omeprazole (PRILOSEC) 20 MG capsule TAKE 1 CAPSULE BY MOUTH DAILY. Disp: 30 capsule Rfl: 11   metoprolol (TOPROL-XL) 50 MG 24 hr tablet Take 1 tablet by mouth daily Disp: 30 tablet Rfl: 11   Calcium Carb-Cholecalciferol 600-500 MG-UNIT CAPS  Take  by mouth. Disp:  Rfl:      No current facility-administered medications for this visit.   Review of Patient's Allergies indicates:   Naproxen                Swelling    Comment:Ed visit with angioedema   Ibuprofen               Other (See Comments)    Comment:Epigastric pain, throat swelling?             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[employer]              Social History Main Topics    Smoking status: Former Smoker                                                                 Packs/day: 0.50      Years: 27.00          Types: Cigarettes       Quit date: 06/28/2002    Smokeless status: Never Used                        Alcohol use: No              Drug use: No              Sexual activity: Not Currently     Partners with: Female       Comment: no STI hx; HIV neg 2002    Other Topics            Concern  Stress Concern          Yes  Weight Concern          Yes  Special Diet            Yes  Exercise                No  Seat Belt               Yes  Self-Exams              Yes    Social History Narrative    [place], [place]. To [place] 2000.    Lives with her son and her parents. Divorced from husband.    Denies DV. No guns in home.      Past Surgical History:  No date: CESAREAN DELIVERY ONLY      Comment: breech  No date: [place] BREAST BIOPSY SPECIMEN      Comment: s/p fibroadenoma on the L,   No date: SEPTOPLASTY/SUBMUCOUS RESECJ W/WO CARTILAGE GRF    Family History    Hypertension Mother     Hypertension Father     Diabetes Mother     Diabetes Maternal Uncle     Diabetes Maternal Uncle     Cancer - Other FamHxNeg     Cancer - Breast FamHxNeg     Cancer - Colon FamHxNeg     Cancer - Lung FamHxNeg     Cancer - Ovarian FamHxNeg     Heart Maternal Grandmother     Heart Maternal Uncle     Heart Maternal Uncle             Objective     All ROS reviewed and discussed and are otherwise negative unless listed above.       PHYSICAL EXAM:    GENERAL APPEARANCE - A&Ox3, WDWN, NAD  HEENT - head normocephalic, atraumatic, EOMI, PERRLA, conjunctiva clear bilaterally, no occular discharge. TM bilaterally gray and translucent, without bulging, erythema or exudate. Light reflex visible bilaterally. Nares patent. Nasal mucosa without erythema or visible mass. Pharynx without erythema, exudate or mass.    NECK - Neck soft, supple. No ant/posterior cervical or supraclavicular LAD .   LUNGS - Lungs CTATB without WRR. Normal inspiratory effort.    CARDIOVASCULAR -  RRR without S3, S4. No murmurs, clicks, gallops or  rubs.  ABDOMEN - Abdomen soft, not distended. + epigastric TTP, nonradiating.  BS normal. No masses, no hepatosplenomegaly.   Back - NTTP along spinous processes or paraspinal mm.  No CVA tenderness.  Extrem - no tremor or edema. DTRs + 2 and equal UE and LE bilat. Pulses normal and equal bilat at dorsalis pedis and posterior tibial arteries  SKIN - skin color, texture, temperature, and turgor are normal in the exposed areas without rash or concerning lesions    ASSESSMENT/PLAN:  1. Gastroesophageal reflux disease without esophagitis  Pt ed provided, diet, PPI BID, suspect may be 2/2 hiatal hernia, pt to f/u with GI, may need to repeat endoscopy. otc agents prn for symptom control  - omeprazole (PRILOSEC) 20 MG capsule; Take 1 capsule by mouth 2 (two) times daily before meals  Dispense: 60 capsule; Refill: 11    2. Gastritis and gastroduodenitis  See above, suspect hiatal hernia, will screen H pylori and treat w triple therapy if positive for infection  - Helicobacter Pylori Stool Antigen; Future  - omeprazole (PRILOSEC) 20 MG capsule; Take 1 capsule by mouth 2 (two) times daily before meals  Dispense: 60 capsule; Refill: 11    3. Dizziness and giddiness  Meniere's disease hx not on problem list, med refilled, pt to f/u with next episode for eval. Monitor for triggers  - meclizine (ANTIVERT) 25 MG TABS; Take 1 tablet by mouth every 8 (eight) hours as needed  Dispense: 30 tablet; Refill: 2      I have reviewed the past medical, surgical, social and family history and updated these sections of EpicCare as relevant. All interim labs, test results, and consult notes were reviewed and discussed with patient. Medications were reconciled during this visit and a current medication list was given to the patient at the end of the visit.      follow up 2-4 weeks if not improving    Ernie Avena,  PA-C

## 2015-09-02 ENCOUNTER — Ambulatory Visit (HOSPITAL_BASED_OUTPATIENT_CLINIC_OR_DEPARTMENT_OTHER): Payer: No Typology Code available for payment source

## 2015-09-02 DIAGNOSIS — K2990 Gastroduodenitis, unspecified, without bleeding: Principal | ICD-10-CM

## 2015-09-02 DIAGNOSIS — K2970 Gastritis, unspecified, without bleeding: Secondary | ICD-10-CM

## 2015-09-02 NOTE — Progress Notes (Signed)
Patient dropped a stool sample.

## 2015-09-03 LAB — HELICOBACTER PYLORI STOOL AG

## 2015-09-03 NOTE — Progress Notes (Signed)
Dear Deborah [name]   Please:     1. Select the language ([place]) appropriate Letter Template.       2. Insert the following comments:           "Continue as you are currently doing. If you have further questions, please contact the clinic to schedule an appointment to discuss your results. ([place]) Continue do mesmo jeito que voc est fazendo atualmente. Se voc tiver alguma [place] pergunta, entre em contato com a clnica para Scientist, clinical (histocompatibility and immunogenetics) uma consulta para conversar sobre os [place].      Signed, Ernie Avena, PA-C    "     3. Send the letter to the patient.    Thank you,  Ernie Avena, PA-C

## 2015-10-25 ENCOUNTER — Telehealth (HOSPITAL_BASED_OUTPATIENT_CLINIC_OR_DEPARTMENT_OTHER): Payer: Self-pay | Admitting: Licensed Practical Nurse

## 2015-10-25 ENCOUNTER — Other Ambulatory Visit (HOSPITAL_BASED_OUTPATIENT_CLINIC_OR_DEPARTMENT_OTHER): Payer: Self-pay | Admitting: Internal Medicine

## 2015-10-25 DIAGNOSIS — I471 Supraventricular tachycardia, unspecified: Secondary | ICD-10-CM

## 2015-10-25 NOTE — Progress Notes (Signed)
PER Pharmacy, Deborah [name] is a 50 year old female has requested a refill of Metoprolol xl 50 mg 24 hr.      Last Office Visit: 08/24/2015 with [facility]   Last Physical Exam: 06/30/2014      Other Med Adult:  Most Recent BP Reading(s)  08/24/15 : 106/76          Cholesterol (mg/dL)   Date Value   10/08/2013 192   ----------    LOW DENSITY LIPOPROTEIN DIRECT (mg/dL)   Date Value   10/08/2013 129   ----------    HIGH DENSITY LIPOPROTEIN (mg/dL)   Date Value   10/08/2013 48   ----------    TRIGLYCERIDES (mg/dL)   Date Value   10/08/2013 223 (H)   ----------        THYROID SCREEN TSH REFLEX FT4 (uIU/mL)   Date Value   03/05/2012 2.73   ----------        TSH (THYROID STIM HORMONE) (uIU/mL)   Date Value   12/22/2010 1.31   ----------      HEMOGLOBIN A1C (%)   Date Value   06/30/2008 5.2   ----------        INR (no units)   Date Value   06/15/2008 1.0 (L)   ----------      SODIUM (mmol/L)   Date Value   11/28/2012 140   ----------      POTASSIUM (mmol/L)   Date Value   11/28/2012 4.1   ----------          CREATININE (mg/dL)   Date Value   11/28/2012 0.7   ----------    Documented patient preferred pharmacies:    [facility]  Phone: [phone] Fax: [phone]

## 2015-10-25 NOTE — Telephone Encounter (Signed)
-----   Message from Darrin Nipper sent at 10/25/2015  3:25 PM EDT -----  Patient has appointment  Soon needs benefit review for:  tdap please.

## 2015-10-25 NOTE — Progress Notes (Unsigned)
Shayleen R from [facility] called the Central Refill Department to complete a benefit analysis for the TDAP Vaccine.    The vaccine is covered under the patients prescription coverage.    Please choose [phone] (Prior Auth/Pharmacy)

## 2015-10-25 NOTE — Progress Notes (Signed)
Chart reviewed. As patient seeing Berlinda Last, PA-C 10/26/15, rx is deferred until this visit. (declined)

## 2015-10-28 ENCOUNTER — Other Ambulatory Visit (HOSPITAL_BASED_OUTPATIENT_CLINIC_OR_DEPARTMENT_OTHER): Payer: Self-pay | Admitting: Internal Medicine

## 2015-10-28 DIAGNOSIS — I471 Supraventricular tachycardia, unspecified: Secondary | ICD-10-CM

## 2015-10-28 NOTE — Progress Notes (Signed)
PER Patient (self), Deborah [name] is a 50 year old female has requested a refill of Metoprolol Succinate ER 50mg .      Last Office Visit: 08/24/2015     With: Tanna Furry  Last Physical Exam: 06/30/2014      HTN Med:    Most Recent BP Reading(s)  08/24/15 : 106/76  05/04/15 : 132/71  01/27/15 : 100/80      Documented patient preferred pharmacies:    [facility]  Phone: [phone] Fax: [phone]

## 2015-10-28 NOTE — Progress Notes (Signed)
Chart reviewed. I am only providing a bridge rx until patient is seen at scheduled f/u with Berlinda Last, PA-C, at which point she can be re-evaluated and new rx provided if indicated.

## 2015-11-16 ENCOUNTER — Ambulatory Visit (HOSPITAL_BASED_OUTPATIENT_CLINIC_OR_DEPARTMENT_OTHER): Payer: No Typology Code available for payment source | Admitting: Physician Assistant

## 2015-11-22 ENCOUNTER — Other Ambulatory Visit (HOSPITAL_BASED_OUTPATIENT_CLINIC_OR_DEPARTMENT_OTHER): Payer: Self-pay | Admitting: Internal Medicine

## 2015-11-22 DIAGNOSIS — I471 Supraventricular tachycardia: Secondary | ICD-10-CM

## 2015-11-22 NOTE — Progress Notes (Signed)
PER Pharmacy, Deborah [name] is a 50 year old female has requested a refill of Toprol XL 50 mg.      Last Office Visit: 08/24/15 with Dr. Ruthann Cancer  Last Physical Exam: 06/30/14      Other Med Adult:  Most Recent BP Reading(s)  08/24/15 : 106/76          Cholesterol (mg/dL)   Date Value   10/08/2013 192   ----------    LOW DENSITY LIPOPROTEIN DIRECT (mg/dL)   Date Value   10/08/2013 129   ----------    HIGH DENSITY LIPOPROTEIN (mg/dL)   Date Value   10/08/2013 48   ----------    TRIGLYCERIDES (mg/dL)   Date Value   10/08/2013 223 (H)   ----------        THYROID SCREEN TSH REFLEX FT4 (uIU/mL)   Date Value   03/05/2012 2.73   ----------        TSH (THYROID STIM HORMONE) (uIU/mL)   Date Value   12/22/2010 1.31   ----------      HEMOGLOBIN A1C (%)   Date Value   06/30/2008 5.2   ----------        INR (no units)   Date Value   06/15/2008 1.0 (L)   ----------      SODIUM (mmol/L)   Date Value   11/28/2012 140   ----------      POTASSIUM (mmol/L)   Date Value   11/28/2012 4.1   ----------          CREATININE (mg/dL)   Date Value   11/28/2012 0.7   ----------      Documented patient preferred pharmacies:    [facility]  Phone: [phone] Fax: [phone]

## 2015-11-24 ENCOUNTER — Other Ambulatory Visit (HOSPITAL_BASED_OUTPATIENT_CLINIC_OR_DEPARTMENT_OTHER): Payer: Self-pay | Admitting: Internal Medicine

## 2015-11-24 DIAGNOSIS — I471 Supraventricular tachycardia, unspecified: Secondary | ICD-10-CM

## 2015-11-24 NOTE — Progress Notes (Signed)
PER Pharmacy, Deborah [name] is a 50 year old female has requested a refill of Metoprolol ER 50mg .      Last Office Visit: 08/24/15  Last Physical Exam: 06/30/14      HTN Med:    Most Recent BP Reading(s)  08/24/15 : 106/76  05/04/15 : 132/71  01/27/15 : 100/80      Documented patient preferred pharmacies:    [facility]  Phone: [phone] Fax: [phone]

## 2015-11-25 NOTE — Telephone Encounter (Signed)
-----   Message from [facility] sent at 11/25/2015  3:11 PM EDT -----  Regarding: refill request  Contact: [place] [id number], 49 year old, female    Calls today:  Refill    !! Before starting refill request, check EPIC to see if encounter for this medication already exists !!    (May list multiple medications in this section)  Medicine Name: Metoprolol  Dosage: 50 mg  Frequency 1 tab daily  Number of pills left: 0    Documented patient preferred pharmacies:    [facility]  Phone: [phone] Fax: [phone]  Person calling on behalf of patient: Patient (self)    CALL BACK NUMBER: [phone]  Best time to call back: anytime  Cell phone:   Other phone:    Patient's language of care: [place]    Patient does not need an interpreter.    Patient's PCP: Malva Limes, MD

## 2015-11-25 NOTE — Progress Notes (Signed)
Pt walks into clinic today demanding refill for Metoprolol rx.   Per chart review:  Pt requested refill on 10/25/15, declined by PCP d/t having appt on 10/26/15 with CM PA-C.   Submitted request again on 10/28/15, was given bridge rx only until pt was seen for re-scheduled appt on 11/16/15 by PCP.   Pt was NO SHOW on 11/16/15 for appt with CM PA-C.   Advised pt of this and states "well yeah I missed my appt, but how are you not going to give me the medication... I need it".     Pt's vitals were taken, stable.   130/78, 80, 99% RA, 16.   Took Metoprolol dose today as ordered.   Pt was advised that RN can send message to covering provider for PCP high urgency to advise if refill appropriate.   Pt rescheduled for 11/30/15 for PE/med refill req.   Advised that if she does not want to wait in interim, she can always go to ED for emergency refill.   Pt said thank you and walked out.     RN to route to covering provider.

## 2015-11-30 ENCOUNTER — Encounter (HOSPITAL_BASED_OUTPATIENT_CLINIC_OR_DEPARTMENT_OTHER): Payer: Self-pay | Admitting: Physician Assistant

## 2015-11-30 ENCOUNTER — Ambulatory Visit (HOSPITAL_BASED_OUTPATIENT_CLINIC_OR_DEPARTMENT_OTHER): Payer: No Typology Code available for payment source | Admitting: Physician Assistant

## 2015-11-30 VITALS — BP 112/76 | HR 76 | Temp 98.1°F | Resp 18 | Wt 165.0 lb

## 2015-11-30 DIAGNOSIS — Z1239 Encounter for other screening for malignant neoplasm of breast: Secondary | ICD-10-CM

## 2015-11-30 DIAGNOSIS — R1013 Epigastric pain: Secondary | ICD-10-CM

## 2015-11-30 DIAGNOSIS — I471 Supraventricular tachycardia, unspecified: Secondary | ICD-10-CM

## 2015-11-30 DIAGNOSIS — Z1211 Encounter for screening for malignant neoplasm of colon: Secondary | ICD-10-CM

## 2015-11-30 MED ORDER — METOPROLOL SUCCINATE ER 50 MG PO TB24
50.0000 mg | ORAL_TABLET | Freq: Every day | ORAL | 3 refills | Status: DC
Start: 2015-11-30 — End: 2016-02-29

## 2015-11-30 NOTE — Progress Notes (Signed)
Review of Systems   Constitutional: Negative for chills, fever, malaise/fatigue and weight loss.   HENT: Negative for congestion, hearing loss and tinnitus.    Eyes: Negative for blurred vision, double vision and photophobia.   Respiratory: Negative for cough and shortness of breath.    Cardiovascular: Negative for chest pain, palpitations and leg swelling.   Gastrointestinal: Negative for abdominal pain, blood in stool, constipation, diarrhea, heartburn, melena, nausea and vomiting.   Genitourinary: Negative for dysuria, frequency, hematuria and urgency.   Musculoskeletal: Negative for joint pain and myalgias.   Skin: Negative for itching and rash.   Neurological: Negative for dizziness, tingling, tremors and headaches.            Subjective     Deborah [name] is a 50 year old female presents for f/u SVT, needing refill of metoprolol. Was given 2 pills by pharmacy, took M/T last week, after 2 days off of medication she developed sob, tachycardia, leg pain, DOE.  Took father's medication last night so is feeling better today. Last f/u with cardio 2.5 yrs ago, advised yearly f/u.     Missed endoscopy, states she does not want to reschedule b/c symptoms improved after she lost 15 pounds, no more reflux or abd pain. Not taking medication for GERD, no throat burning.     Patient Active Problem List:     Lump or mass in breast     Gastroesophageal reflux disease without esophagitis     Other specified gastritis     Abdominal pain, epigastric     Angioneurotic edema not elsewhere classified     Plantar Fasciitis, L     Leg pain     Tobacco abuse, in remission     Triggering of Finger, R 3rd     Right subacromial bursitis/rotator cuff tendinopathy     Chondromalacia patellae     Lower back pain     De Quervain's Tenosynovitis, R     CTS (Carpal Tunnel Syndrome), b/l     Family history of diabetes mellitus (DM)     Hypolipoproteinemia     Intermittent spinal claudication (HCC)     Vitamin D deficiency     Greater  Trochanteric Bursitis, b/l     DDD (degenerative disc disease), lumbar     Obesity     SVT To 240 BPM March 2010     Palpitations     Rapid Noctural Palpitations x 10 Seconds     Sweating     Microscopic hematuria     Reflux esophagitis     Lump of skin of lower extremity     Wheezing     Bilateral knee pain     Bilateral arm pain     Positive occult stool blood test     Chronic right-sided low back pain with right-sided sciatica      Current Outpatient Prescriptions:  metoprolol (TOPROL-XL) 50 MG 24 hr tablet TAKE 1 TABLET BY MOUTH DAILY Disp: 21 tablet Rfl: 0   omeprazole (PRILOSEC) 20 MG capsule Take 1 capsule by mouth 2 (two) times daily before meals Disp: 60 capsule Rfl: 11   meclizine (ANTIVERT) 25 MG TABS Take 1 tablet by mouth every 8 (eight) hours as needed Disp: 30 tablet Rfl: 2   Calcium Carb-Cholecalciferol 600-500 MG-UNIT CAPS Take  by mouth. Disp:  Rfl:      No current facility-administered medications for this visit.   Review of Patient's Allergies indicates:   Naproxen  Swelling    Comment:Ed visit with angioedema   Ibuprofen               Other (See Comments)    Comment:Epigastric pain, throat swelling?             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[employer]              Social History Main Topics    Smoking status: Former Smoker                                                                Packs/day: 0.50      Years: 27.00          Types: Cigarettes       Quit date: 06/28/2002    Smokeless status: Never Used                        Alcohol use: No              Drug use: No              Sexual activity: Not Currently     Partners with: Female        Comment: no STI hx; HIV neg 2002    Other Topics            Concern  Stress Concern          Yes  Weight Concern          Yes  Special Diet            Yes  Exercise                No  Seat Belt               Yes  Self-Exams              Yes    Social History Narrative    [place], [place]. To [place] 2000.    Lives with her son and her parents. Divorced from husband.    Denies DV. No guns in home.      Past Surgical History:  No date: CESAREAN DELIVERY ONLY      Comment: breech  No date: [place] BREAST BIOPSY SPECIMEN      Comment: s/p fibroadenoma on the L,   No date: SEPTOPLASTY/SUBMUCOUS RESECJ W/WO CARTILAGE GRF    Family History  Hypertension Mother     Hypertension Father     Diabetes Mother     Diabetes Maternal Uncle     Diabetes Maternal Uncle     Cancer - Other FamHxNeg     Cancer - Breast FamHxNeg     Cancer - Colon FamHxNeg     Cancer - Lung FamHxNeg     Cancer - Ovarian FamHxNeg     Heart Maternal Grandmother     Heart Maternal Uncle     Heart Maternal Uncle             Objective     All ROS reviewed and discussed and are otherwise negative unless listed above.       PHYSICAL EXAM:  BP 112/76   Pulse 76   Temp 98.1 F (36.7 C) (Temporal)   Resp 18   Wt 74.8 kg (165 lb)   LMP 10/17/2006   SpO2 97%   BMI 30.67 kg/m2    GENERAL APPEARANCE - A&Ox3, WDWN, NAD  HEENT - head normocephalic, atraumatic, EOMI, PERRLA, conjunctiva clear bilaterally, no occular discharge. [name]    NECK - Neck soft, supple. No ant/posterior cervical or supraclavicular LAD .   LUNGS - Lungs CTATB without WRR. Normal inspiratory effort.    CARDIOVASCULAR -  RRR without S3, S4. No murmurs, clicks, gallops or rubs.  Extrem - no tremor or edema.   SKIN - skin color, texture, temperature, and turgor are normal in the exposed areas without rash or concerning lesions    A/P:  1. SVT To 240 BPM March 2010  Med refilled, encouraged cardio f/u. Avoid caffeine/stimulants. Encouraged hydration  - REFERRAL TO CARDIOLOGY ( INT)  - metoprolol (TOPROL-XL) 50 MG  24 hr tablet; Take 1 tablet by mouth daily  Dispense: 90 tablet; Refill: 3    2. Abdominal pain, epigastric  Seems to have resolved with diet and weight loss, okay to hold off on endoscopy for now but should f/u if symptoms return    3. Screening for malignant neoplasm of breast  - [facility] Screening Bilateral W CAD; Future    4. Screening for colon cancer  - IFOB (Immunoassay Fecal Occult Blood Point of Care Test); Future  - [facility]&/OR CONVEY OF SPEC FOR TR OFFICE TO LAB [99000]        I have reviewed the past medical, surgical, social and family history and updated these sections of EpicCare as relevant. All interim labs, test results, and consult notes were reviewed and discussed with patient. Medications were reconciled during this visit and a current medication list was given to the patient at the end of the visit.      follow up prn, as above    Ernie Avena, PA-C

## 2015-12-14 ENCOUNTER — Ambulatory Visit (HOSPITAL_BASED_OUTPATIENT_CLINIC_OR_DEPARTMENT_OTHER): Payer: Self-pay | Admitting: Physician Assistant

## 2015-12-15 ENCOUNTER — Ambulatory Visit (HOSPITAL_BASED_OUTPATIENT_CLINIC_OR_DEPARTMENT_OTHER): Payer: Self-pay | Admitting: Physician Assistant

## 2015-12-15 DIAGNOSIS — Z1239 Encounter for other screening for malignant neoplasm of breast: Secondary | ICD-10-CM

## 2015-12-15 NOTE — Addendum Note (Signed)
Addended by: Jilda Roche on: 12/15/2015 05:36 PM     Modules accepted: Orders

## 2015-12-16 LAB — MA SCREENING MAMMO BILATERAL DIGITAL WITH DBT & CAD

## 2016-01-18 ENCOUNTER — Other Ambulatory Visit (HOSPITAL_BASED_OUTPATIENT_CLINIC_OR_DEPARTMENT_OTHER): Payer: Self-pay | Admitting: Internal Medicine

## 2016-01-18 DIAGNOSIS — E559 Vitamin D deficiency, unspecified: Secondary | ICD-10-CM

## 2016-01-18 MED ORDER — VITAMIN D 2000 UNITS PO TABS: 2000 [IU] | tablet | Freq: Every day | ORAL | 11 refills | 0 days | Status: DC

## 2016-01-18 MED ORDER — VITAMIN D 50 MCG (2000 UT) PO TABS
2000.0000 [IU] | ORAL_TABLET | Freq: Every day | ORAL | 11 refills | Status: DC
Start: 2016-01-18 — End: 2017-01-29

## 2016-01-18 NOTE — Progress Notes (Signed)
PER Pharmacy, Deborah [name] is a 50 year old female has requested a refill of VITAMIN D 2000 UNIT.      Last [facility] Office Visit: 11/30/2015  Last Physical Exam: 06/30/2014      Other Med Adult:  Most Recent BP Reading(s)  11/30/15 : 112/76          Cholesterol (mg/dL)   Date Value   10/08/2013 192   ----------    LOW DENSITY LIPOPROTEIN DIRECT (mg/dL)   Date Value   10/08/2013 129   ----------    HIGH DENSITY LIPOPROTEIN (mg/dL)   Date Value   10/08/2013 48   ----------    TRIGLYCERIDES (mg/dL)   Date Value   10/08/2013 223 (H)   ----------        THYROID SCREEN TSH REFLEX FT4 (uIU/mL)   Date Value   03/05/2012 2.73   ----------        TSH (THYROID STIM HORMONE) (uIU/mL)   Date Value   12/22/2010 1.31   ----------      HEMOGLOBIN A1C (%)   Date Value   06/30/2008 5.2   ----------        INR (no units)   Date Value   06/15/2008 1.0 (L)   ----------      SODIUM (mmol/L)   Date Value   11/28/2012 140   ----------      POTASSIUM (mmol/L)   Date Value   11/28/2012 4.1   ----------          CREATININE (mg/dL)   Date Value   11/28/2012 0.7   ----------      Documented patient preferred pharmacies:    [facility]  Phone: [phone] Fax: [phone]

## 2016-02-07 ENCOUNTER — Encounter (HOSPITAL_BASED_OUTPATIENT_CLINIC_OR_DEPARTMENT_OTHER): Payer: Self-pay | Admitting: Internal Medicine

## 2016-02-07 ENCOUNTER — Ambulatory Visit (HOSPITAL_BASED_OUTPATIENT_CLINIC_OR_DEPARTMENT_OTHER): Payer: No Typology Code available for payment source | Admitting: Internal Medicine

## 2016-02-07 VITALS — BP 110/70 | HR 83 | Ht 61.0 in | Wt 178.0 lb

## 2016-02-07 DIAGNOSIS — K219 Gastro-esophageal reflux disease without esophagitis: Secondary | ICD-10-CM

## 2016-02-07 DIAGNOSIS — I471 Supraventricular tachycardia, unspecified: Secondary | ICD-10-CM | POA: Insufficient documentation

## 2016-02-07 DIAGNOSIS — R002 Palpitations: Secondary | ICD-10-CM

## 2016-02-07 LAB — COMPREHENSIVE METABOLIC PANEL
ALANINE AMINOTRANSFERASE: 34 U/L (ref 12–45)
ALBUMIN: 3.9 g/dL (ref 3.4–5.0)
ALKALINE PHOSPHATASE: 117 U/L (ref 45–117)
ANION GAP: 12 mmol/L (ref 5–15)
ASPARTATE AMINOTRANSFERASE: 19 U/L (ref 8–34)
BILIRUBIN TOTAL: 0.2 mg/dL (ref 0.2–1.0)
BUN (UREA NITROGEN): 17 mg/dL (ref 7–18)
CALCIUM: 9.2 mg/dL (ref 8.5–10.1)
CARBON DIOXIDE: 28 mmol/L (ref 21–32)
CHLORIDE: 104 mmol/L (ref 98–107)
CREATININE: 0.9 mg/dL (ref 0.4–1.2)
ESTIMATED GLOMERULAR FILT RATE: >60 mL/min (ref >60)
Glucose Random: 83 mg/dL (ref 74–160)
POTASSIUM: 4.2 mmol/L (ref 3.5–5.1)
SODIUM: 144 mmol/L (ref 136–145)
TOTAL PROTEIN: 7.7 g/dL (ref 6.4–8.2)

## 2016-02-07 LAB — CBC, PLATELET & DIFFERENTIAL
ABSOLUTE BASO COUNT: 0 10*3/uL (ref 0.0–0.1)
ABSOLUTE EOSINOPHIL COUNT: 0.1 10*3/uL (ref 0.0–0.8)
ABSOLUTE IMM GRAN COUNT: 0.02 10*3/uL (ref 0.00–0.03)
ABSOLUTE LYMPH COUNT: 3.3 10*3/uL (ref 0.6–5.9)
ABSOLUTE MONO COUNT: 0.7 10*3/uL (ref 0.2–1.4)
ABSOLUTE NEUTROPHIL COUNT: 4.1 10*3/uL (ref 1.6–8.3)
BASOPHIL %: 0.2 % (ref 0.0–1.2)
EOSINOPHIL %: 1.3 % (ref 0.0–7.0)
HEMATOCRIT: 38.9 % (ref 34.1–44.9)
HEMOGLOBIN: 12.9 g/dL (ref 11.2–15.7)
IMMATURE GRANULOCYTE %: 0.2 % (ref 0.0–0.4)
LYMPHOCYTE %: 40.3 % (ref 15.0–54.0)
MEAN CORP HGB CONC: 33.2 g/dL (ref 31.0–37.0)
MEAN CORPUSCULAR HGB: 29.5 pg (ref 26.0–34.0)
MEAN CORPUSCULAR VOL: 88.8 fL (ref 80.0–100.0)
MEAN PLATELET VOLUME: 9.9 fL (ref 8.7–12.5)
MONOCYTE %: 8.2 % (ref 4.0–13.0)
NEUTROPHIL %: 49.8 % (ref 40.0–75.0)
PLATELET COUNT: 219 10*3/uL (ref 150–400)
RBC DISTRIBUTION WIDTH STD DEV: 39.9 fL (ref 35.1–46.3)
RBC DISTRIBUTION WIDTH: 12.4 % (ref 11.5–14.3)
RED BLOOD CELL COUNT: 4.38 M/uL (ref 3.90–5.20)
WHITE BLOOD CELL COUNT: 8.2 10*3/uL (ref 4.0–11.0)

## 2016-02-07 LAB — THYROID SCREEN TSH REFLEX FT4: THYROID SCREEN TSH REFLEX FT4: 1.38 u[IU]/mL (ref 0.358–3.740)

## 2016-02-07 NOTE — Progress Notes (Signed)
Patient feels safe at home.

## 2016-02-07 NOTE — Progress Notes (Signed)
This office note has been dictated. Account number [id number]

## 2016-02-08 NOTE — Progress Notes (Signed)
Date of Service: 02/07/2016    February 07, 2016      Ernie Avena, [place]   [address]  [place], [place] [address] [place]    Re:  Deborah [name]    Dear Ms. Ruthann Cancer:     I saw Deborah [name] in the office on 30 October in consultation regarding SVT with recurring symptoms.  Recall, she is a 50 year old [place] mother of 1, whose problem list includes GERD, rapid palpitations, SVT to 240, first seen in March 2010 and some wheezing.  Medications today per Epic include meclizine 25 p.r.n. q.8, metoprolol 50, and omeprazole 20.  There are allergies to naproxen with swelling and _____ epigastric pain.    Family history is noncontributory for heart disease.  She has 27 pack years, but now uses no substances.  She lives with her son and her parents and is divorced.    When I saw her last 3 years ago, I noted that she had a Holter in 2013, which showed negligible ectopy and a normal echo.  She was having palpitations lasting a few seconds every other week, which sometimes awoke from her bed and she feels like it has increased recently.  She had taken metoprolol before and caused weakness and fatigue, but it probably was due to a concurrent pneumonia.  Her exam when I saw her last was unremarkable and I thought she was doing well.    She has had nocturnal bout of palpitations perhaps every 2 weeks and rarely lasts up to 15 minutes, which it did a month ago, but now they are 1 minute.  She does Valsalva and gag and commonly has benefit, but 4 weeks ago they did not work for her.  I have advised her to try leaving her body on the bed and leaning her head over to touch the floor while she is doing these maneuvers, if she finds symptoms are refractory.    She tells me she is short of breath after she climbs a floor of stairs, but occasionally she walks 2 floors and gets there, but is short of breath.    Review today is pertinent for ocular muscles intact, and the tongue is midline with symmetric  face.  No palpable cervical nodes or thyroid and no JVD.  The chest is clear.  Heart is regular without murmurs or gallops.  The abdomen is soft without palpable organ, mass, or tenderness.  There is no rash, clubbing, cyanosis, or edema.  She is fully oriented and has appropriate affect.    Today on exam, she stands 5 feet 1 inch and weighs 178 pounds for a BMI of 34.  Pressure 110/70 with rate 80 and room air saturation 99%.      My impression is that of chronic SVT which she usually can relieve with symptoms in which got substantially better with the metoprolol.  I think I will get an echo and Holter before I change anything, and then I may double up on the metoprolol.  Her resting heart rate today is 80, so she undoubtedly could take another 25, but probably 50 and it may alleviate her symptoms without having to jump through hoops with vagal maneuvers.  Regardless, I want the echo and Holter first and I will see her back in 3 weeks to discuss the results.  I am not going to change any medicines now.    Her bloods have not been done in a year, so  I am also going to get a CMP and CBC and TSH.  Her lipids were done in 2 years and they were pretty good with an LDL of 129, but I will check those as well.  The last chemistry was 2014 and was completely normal, but it is worth looking again.    Sincerely,    ___________________________  Reviewed and Electronically Signed By: Brandy Hale MD  Sig Date: 02/08/2016  Sig Time: 19:45:29  Dictated By: Brandy Hale MD  Dict Date: 02/07/2016 Dict Time: 05 25 PM    Dictation Date and Time:02/07/2016 17:25:56  Transcription Date and Time:02/08/2016 07:32:45  eScription Dictation id: [id number] [id number] # :[id number]

## 2016-02-12 LAB — EKG

## 2016-02-15 ENCOUNTER — Ambulatory Visit (HOSPITAL_BASED_OUTPATIENT_CLINIC_OR_DEPARTMENT_OTHER): Payer: Self-pay | Admitting: Internal Medicine

## 2016-02-22 LAB — ECHOCARDIOGRAM W/ DOPPLER

## 2016-02-29 ENCOUNTER — Ambulatory Visit (HOSPITAL_BASED_OUTPATIENT_CLINIC_OR_DEPARTMENT_OTHER): Payer: No Typology Code available for payment source | Admitting: Internal Medicine

## 2016-02-29 DIAGNOSIS — R002 Palpitations: Secondary | ICD-10-CM

## 2016-02-29 DIAGNOSIS — I471 Supraventricular tachycardia: Secondary | ICD-10-CM

## 2016-02-29 MED ORDER — METOPROLOL SUCCINATE ER 100 MG PO TB24
100.0000 mg | ORAL_TABLET | Freq: Every day | ORAL | 11 refills | Status: DC
Start: 2016-02-29 — End: 2016-11-28

## 2016-02-29 MED ORDER — METOPROLOL SUCCINATE ER 100 MG PO TB24: 100 mg | tablet | Freq: Every day | ORAL | 11 refills | 0 days | Status: DC

## 2016-02-29 NOTE — Progress Notes (Signed)
This office note has been dictated. Account number [id number]

## 2016-02-29 NOTE — Progress Notes (Signed)
Date of Service: 02/29/2016    February 29, 2016    Deborah Limes, MD  [facility]   [place], [place] [address]    RE:  Deborah [name]    Dear Deborah [name]:    I saw Deborah [name] in the office on 17 November in followup of her prior visit with me of 1 month ago regarding SVT with recurring symptoms.  Recall, she is a 50 year old [place] mother of 1 whose problem list includes GERD, rapid palpitations, SVT to 240 first seen 7 years ago, and wheezing.    Medications include meclizine p.r.n., metoprolol 50, and omeprazole 20 with allergies to naproxen.    Family history and social history are per my last note with no change and in her review of systems is similarly unchanged from last.      Her complaint on her last visit was nocturnal palpitations a few times a month, lasting up to 15 minutes, but usually only 1 minute and commonly, but not always, can be relieved with vagal maneuvers.  She has dyspnea on a floor of stairs, but occasionally walks 2 floors with dyspnea, which might be physiologic.  Physical exam was unremarkable as were the vitals.  I sent her for an echo and Holter with the plan to double up on her metoprolol.  Since heart rate was 80, it seems that she would tolerate it.  I also sent her for some bloods.    TSH was normal, as well as a CBC and the CMP.  Her echo from 14 November showed normal LV function of 60% and was completely normal.  Specifically, both atria were normal in size, and there was no MR.  This was all unchanged from 4 years ago.  The Holter monitor is done, but is not yet scan.  I checked with the lab, so I will be getting it next week, and I will call her with the result.    Regardless of the Holter, we know she is having recurrent palpitations and sometimes they are fast and alarming.  So, I think I should treat.    Today, she stands 5 feet 1 inch, weight 170 pounds, for a BMI of 34 and room air sat last month of 99%.  Today, I measured her heart rate myself 80 and a pressure at 116/74.   Ocular muscles are intact, and the tongue is midline with symmetric face.  No palpable cervical nodes or thyroid.  No JVD.  The chest is clear.  The heart is regular, without murmurs, or gallops.  Abdomen is soft and obese with a palpable organ, mass, tenderness.  There is no rash, clubbing, or cyanosis, or edema.  She is fully oriented and has appropriate affect.    I am going to increase her metoprolol to 100 and have her call me in 3 days with her status.  If she has adverse symptoms we will try 75 instead of 100.    I am going to put down to see her again in 6 months, but I will talk to her in 3 days or sooner until we get the right dose that does not cause adverse effects, but does adequately suppress her SVT.  Ablation is an option, but I think it is important if she can get away with some simple medicines that do not adversely affect her.    Thank you for the privilege of participating in her care.    Sincerely,    ___________________________  Reviewed  and Electronically Signed By: Brandy Hale MD  Sig Date: 03/07/2016  Sig Time: 20:11:46  Dictated By: Brandy Hale MD  Dict Date: 02/29/2016 Dict Time: 11 38 AM    Dictation Date and Time:02/29/2016 11:38:23  Transcription Date and Time:02/29/2016 12:05:06  eScription Dictation id: [id number] Confirmation # :[id number]      cc: Romona Curls MD

## 2016-03-08 ENCOUNTER — Encounter (HOSPITAL_BASED_OUTPATIENT_CLINIC_OR_DEPARTMENT_OTHER): Payer: Self-pay | Admitting: Internal Medicine

## 2016-03-08 ENCOUNTER — Ambulatory Visit (HOSPITAL_BASED_OUTPATIENT_CLINIC_OR_DEPARTMENT_OTHER): Payer: No Typology Code available for payment source | Admitting: Internal Medicine

## 2016-03-08 ENCOUNTER — Telehealth (HOSPITAL_BASED_OUTPATIENT_CLINIC_OR_DEPARTMENT_OTHER): Payer: Self-pay | Admitting: Internal Medicine

## 2016-03-08 VITALS — BP 106/71 | HR 90 | Temp 97.2°F | Resp 18 | Ht 61.0 in | Wt 178.0 lb

## 2016-03-08 DIAGNOSIS — Z6834 Body mass index (BMI) 34.0-34.9, adult: Secondary | ICD-10-CM | POA: Insufficient documentation

## 2016-03-08 DIAGNOSIS — I471 Supraventricular tachycardia, unspecified: Secondary | ICD-10-CM

## 2016-03-08 DIAGNOSIS — Z6833 Body mass index (BMI) 33.0-33.9, adult: Secondary | ICD-10-CM

## 2016-03-08 DIAGNOSIS — Z6835 Body mass index (BMI) 35.0-35.9, adult: Secondary | ICD-10-CM | POA: Insufficient documentation

## 2016-03-08 DIAGNOSIS — K219 Gastro-esophageal reflux disease without esophagitis: Secondary | ICD-10-CM

## 2016-03-08 DIAGNOSIS — Z23 Encounter for immunization: Secondary | ICD-10-CM

## 2016-03-08 DIAGNOSIS — Z0000 Encounter for general adult medical examination without abnormal findings: Secondary | ICD-10-CM

## 2016-03-08 NOTE — Progress Notes (Signed)
Patient feels safe at home.

## 2016-03-08 NOTE — Progress Notes (Signed)
Sarah from [facility] called the Central Refill Department to complete a benefit analysis for the TDAP Vaccine.    The vaccine is covered under the patients prescription coverage.    Please choose 90715.2 boostrix (Prior Auth/Pharmacy)

## 2016-03-08 NOTE — Progress Notes (Signed)
Influenza Vaccine Procedure  March 08, 2016    1. Has the patient received the information for the influenza vaccine? No    2. Does the patient have any of the following contraindications?  Allergy to eggs? No  Allergic reaction to previous influenza vaccines? No  Any other problems to previous influenza vaccines? No  Paralyzed by Guillain-Barre syndrome?  No  Currently pregnant? No  Current moderate or severe illness? No  Allergy to contact lens solution? No    3. The vaccine has been administered in the usual fashion and the patient/guardian was instructed to wait 20 minutes before leaving the building in the event of an allergic reaction:     Immunization information and current VIS for flu vaccine(s) reviewed; verbal consent given by patient/guardian.

## 2016-03-08 NOTE — Progress Notes (Signed)
Deborah [name] is a 50 year old female    Review of Patient's Allergies indicates:   Naproxen                Swelling    Comment:Ed visit with angioedema   Ibuprofen               Other (See Comments)    Comment:Epigastric pain, throat swelling?             05/06/12 pt states uses sometimes without issue   Pollen extract-tree*    Runny Nose    Comment:HA, itchy watery eyes      Current Outpatient Prescriptions:  metoprolol (TOPROL-XL) 100 MG 24 hr tablet Take 1 tablet by mouth daily Disp: 30 tablet Rfl: 11   cholecalciferol (VITAMIN D3) 2000 UNIT tablet Take 1 tablet by mouth daily Disp: 30 tablet Rfl: 11   omeprazole (PRILOSEC) 20 MG capsule Take 1 capsule by mouth 2 (two) times daily before meals Disp: 60 capsule Rfl: 11   meclizine (ANTIVERT) 25 MG TABS Take 1 tablet by mouth every 8 (eight) hours as needed Disp: 30 tablet Rfl: 2   Calcium Carb-Cholecalciferol 600-500 MG-UNIT CAPS Take  by mouth. Disp:  Rfl:      No current facility-administered medications for this visit.   Patient Active Problem List:     Lump or mass in breast     Gastroesophageal reflux disease without esophagitis     Other specified gastritis     Abdominal pain, epigastric     Angioneurotic edema not elsewhere classified     Plantar Fasciitis, L     Leg pain     Tobacco abuse, in remission     Triggering of Finger, R 3rd     Right subacromial bursitis/rotator cuff tendinopathy     Chondromalacia patellae     Lower back pain     De Quervain's Tenosynovitis, R     CTS (Carpal Tunnel Syndrome), b/l     Family history of diabetes mellitus (DM)     Hypolipoproteinemia     Intermittent spinal claudication (HCC)     Vitamin D deficiency     Greater Trochanteric Bursitis, b/l     DDD (degenerative disc disease), lumbar     Obesity     Palpitations     Rapid Noctural Palpitations x 10 Seconds     Sweating     Microscopic hematuria     Reflux esophagitis     Lump of skin of lower extremity     Wheezing     Bilateral knee pain     Bilateral arm  pain     Positive occult stool blood test     Chronic right-sided low back pain with right-sided sciatica     SVT To 240 BPM March 2010. As Of 2017, Episodes q 2 Weeks, Usually Subside One Minute, Occ 15 Minutes.  Commonly Relieved With Vagal Maneuvers.     BMI 33.0-33.9,adult      Social History  Social History   Marital status: Divorced  Spouse name: N/A    Years of education: N/A  Number of children: 1     Occupational History  housecleaning SELF EMPLO      Social History Main Topics   Smoking status: Former Smoker  0.50 Packs/day  For 27.00 Years     Types: Cigarettes    Quit date: 06/28/2002    Smokeless tobacco: Never Used    Alcohol use No  Drug use: No    Sexual activity: Not Currently    Partners: Male    Comment: no STI hx; HIV neg 2002     Other Topics Concern    Stress Concern Yes    Weight Concern Yes    Special Diet Yes    Exercise No    Seat Belt Yes    Self-Exams Yes     Social History Narrative    [place], [place]. To [place] 2000.    Lives with her son and her parents. Divorced from husband.    Denies DV. No guns in home.     Pt here for CPE and f/u re SVT, GERD, and obesity    Notes that she recently increased her Metoprolol XL dose from 50mg  to 100mg  at recommendation of her cardiologist. For the first two days, she felt more tired and as if her heart were tremulous, with associated fatigue. This resolved by the third day. Denies any CP or SOB, with palpitations resolved.    Notes continued occasional GERD symptoms with epigastric burning pain and N/V. She experienced two recent episodes of vomiting, with symptoms improved after this. One occurred after she ate buttered popcorn at the movies. When is symptomatic, she takes Omeprazole bid, which resolves symptoms.            Physical Exam   Constitutional: She appears well-developed and well-nourished. No distress.   HENT:   Head: Normocephalic and atraumatic.   Neck: Neck supple. No JVD present.   Cardiovascular: Normal rate, regular rhythm and normal  heart sounds.  Exam reveals no gallop and no friction rub.    No murmur heard.  Pulmonary/Chest: Effort normal and breath sounds normal. No respiratory distress. She has no wheezes. She has no rales. She exhibits no tenderness.   Abdominal: Soft. Bowel sounds are normal. She exhibits no distension and no mass. There is no tenderness. There is no rebound and no guarding.   Musculoskeletal: She exhibits no edema or tenderness.   No LE edema or TTP b/l   Neurological: She is alert. Coordination normal.   Skin: Skin is warm and dry. She is not diaphoretic.   Psychiatric: Her behavior is normal.        03/08/16  1525   BP: 106/71   Site: Left Arm   Position: Sitting   Cuff Size: Large   Pulse: 90   Resp: 18   Temp: 97.2 F (36.2 C)   TempSrc: Temporal   SpO2: 98%   Weight: 80.7 kg (178 lb)   Height: 5\' 1"  (1.549 m)     (Z00.00) Physical exam, routine  (primary encounter diagnosis)  Comment: obese female  Plan: next CPE >03/08/17    (I47.1) SVT To 240 BPM March 2010. As Of 2017, Episodes q 2 Weeks, Usually Subside One Minute, Occ 15 Minutes.  Commonly Relieved With Vagal Maneuvers.  Comment: with symptoms now resolved on increased dose of Metoprolol  Plan: continue current dose and f/u with cardiology as scheduled and prn    (Z68.33) BMI 33.0-33.9,adult  Comment: chronic, no regular exercise; pt interested in abdominoplasty  Plan: counseled re need for wt loss but I do not recommend surgery for weight loss; pt to review with plastics re potential benefits and risks    (Z23) Need for prophylactic vaccination and inoculation against influenza  Comment: none this year  Plan: IMMUNIZATION ADMIN SINGLE, RN, PR INFLUENZA VAC        QUAD 3 YRS PLUS IM (PUBLIC)  Today with RN    (Z23) Need for prophylactic vaccination with combined diphtheria-tetanus-pertussis (DTP) vaccine  Comment: no prev TDAP  Plan: IMMUNIZATION ADMIN EACH ADD, RN, TDAP VACCINE 7        AND OLDER IM (PRIOR AUTH NEEDED)        Today with RN    (K21.9)  Gastroesophageal reflux disease without esophagitis  Comment: chronic, episodic with PO intake of fried or oily food  Plan: counseled to avoid triggers and continue PPI prn    I have reviewed the past medical, surgical, social and family history and updated these sections of EpicCare as relevant. All interim labs, test results, and consult notes were reviewed and discussed with Marlou Sa. Medications were reconciled during this visit and a current medication list was given to the patient at the end of the visit.    F/u c PCP prn and as directed

## 2016-03-21 LAB — HOLTER MONITORING

## 2016-04-18 ENCOUNTER — Encounter (HOSPITAL_BASED_OUTPATIENT_CLINIC_OR_DEPARTMENT_OTHER): Payer: Self-pay | Admitting: Plastic Surgery

## 2016-04-18 NOTE — Patient Instructions (Signed)
Called w/port. interp [facility]) [phone] of appt rescheduled w/Dr Reformat from 05/19/16 to 06/23/16 at 2:30 and mailed new rescheduled appt letter.

## 2016-05-19 ENCOUNTER — Ambulatory Visit (HOSPITAL_BASED_OUTPATIENT_CLINIC_OR_DEPARTMENT_OTHER): Payer: No Typology Code available for payment source | Admitting: Plastic Surgery

## 2016-06-16 ENCOUNTER — Telehealth (HOSPITAL_BASED_OUTPATIENT_CLINIC_OR_DEPARTMENT_OTHER): Payer: Self-pay | Admitting: Plastic Surgery

## 2016-06-16 NOTE — Progress Notes (Signed)
Called pt w/an interp [phone] of appt rescheduled w/Dr Reformat from 06/23/16 to 07/18/16 at 9:30 and mailed an appt reminder.

## 2016-06-23 ENCOUNTER — Ambulatory Visit (HOSPITAL_BASED_OUTPATIENT_CLINIC_OR_DEPARTMENT_OTHER): Payer: No Typology Code available for payment source | Admitting: Plastic Surgery

## 2016-06-30 ENCOUNTER — Encounter (HOSPITAL_BASED_OUTPATIENT_CLINIC_OR_DEPARTMENT_OTHER): Payer: Self-pay

## 2016-07-14 ENCOUNTER — Encounter (HOSPITAL_BASED_OUTPATIENT_CLINIC_OR_DEPARTMENT_OTHER): Payer: Self-pay

## 2016-07-18 ENCOUNTER — Ambulatory Visit (HOSPITAL_BASED_OUTPATIENT_CLINIC_OR_DEPARTMENT_OTHER): Payer: No Typology Code available for payment source | Admitting: Plastic Surgery

## 2016-07-18 ENCOUNTER — Encounter (HOSPITAL_BASED_OUTPATIENT_CLINIC_OR_DEPARTMENT_OTHER): Payer: Self-pay | Admitting: Plastic Surgery

## 2016-07-18 VITALS — BP 127/78

## 2016-07-18 DIAGNOSIS — E65 Localized adiposity: Secondary | ICD-10-CM

## 2016-07-18 NOTE — Progress Notes (Signed)
Plastic and Reconstructive Surgery      Reason for Consultation: Deborah [name] is a 51 year old F with a complaint of excessive abdominal skin and fat x 15 years.     G1P1 female, gained weight since her last (and only) pregnancy and has this has been steadily been rising despite changes in diet and exercise (currently 184 lbs, nadir at time of pregnancy 90-100#?). 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 No report of intertrigo, hernia, etc. Also bothered by her large breasts for which she wears a size 39 "Large/Extra Large" cup. C/O lower back pain and heavy weight of her abdominal tissue. Is interested in possible surgical correction of her abdominal adiposity.    Past medical history is significant for:  Past Medical History:  No date: Esophageal reflux  No date: Irregular menstrual cycle  No date: Pregnant state, incidental      Comment: c/s breech  3/10: SVT (supraventricular tachycardia) (HCC)      Comment: [place]    Past surgical history includes:  Past Surgical History:  No date: CESARE[facility] DELIVERY ONLY      Comment: breech  No date: [place] BREAST BIOPSY SPECIMEN      Comment: s/p fibroadenoma on the L,   No date: SEPTOPLASTY/SUBMUCOUS RESECJ W/WO CARTILAGE GRF    Allergies to:  Review of Patient's Allergies indicates:   Naproxen                Swelling    Comment:Ed visit with angioedema   Ibuprofen               Other (See Comments)    Comment:Epigastric pain, throat swelling?             05/06/12 pt states uses sometimes without issue   Pollen extract-tree*    Runny Nose    Comment:HA, itchy watery eyes      Current Outpatient Prescriptions on File Prior to Visit:  metoprolol (TOPROL-XL) 100 MG 24 hr tablet Take 1 tablet by mouth daily Disp: 30 tablet Rfl: 11   cholecalciferol (VITAMIN D3) 2000 UNIT tablet Take 1 tablet by mouth daily Disp: 30 tablet Rfl: 11   omeprazole (PRILOSEC) 20 MG capsule Take 1 capsule by mouth 2  (two) times daily before meals Disp: 60 capsule Rfl: 11   meclizine (ANTIVERT) 25 MG TABS Take 1 tablet by mouth every 8 (eight) hours as needed Disp: 30 tablet Rfl: 2   Calcium Carb-Cholecalciferol 600-500 MG-UNIT CAPS Take  by mouth. Disp:  Rfl:      No current facility-administered medications on file prior to visit.       Social History  Social History   Marital status: Divorced  Spouse name: N/A    Years of education: N/A  Number of children: 1     Occupational History  housecleaning SELF EMPLO      Social History Main Topics   Smoking status: Former Smoker  0.50 Packs/day  For 27.00 Years     Types: Cigarettes    Quit date: 06/28/2002    Smokeless tobacco: Never Used    Alcohol use No    Drug use: No    Sexual activity: Not Currently    Partners: Male    Comment: no STI hx; HIV neg 2002     Other Topics Concern    Stress Concern Yes    Weight Concern Yes    Special Diet  Yes    Exercise No    Seat Belt Yes    Self-Exams Yes     Social History Narrative    [place], [place]. To [place] 2000.    Lives with her son and her parents. Divorced from husband.    Denies DV. No guns in home.        No regular exercise. Dental care in place.       On physical exam,  NAD, well appearing, obese (BMI 33, ht 157cm, weight 184lbs)  abd has mild skin excess (grade I pannus) and severe adiposity (mostly subcutaneous). No visible scars. No hernia.   Breast exam deferred    Diagnostic Studies: mammo BIRADS 1 2017    Assessment: Deborah [name] is a 51 year old F with severe truncal adiposity. We spent approximately 30 minutes today discussing the case and care with the patient with more than half of this time spent discussing coordination of care and counseling. The plan is for a referral to both nutrition and physical therapy to assist her in weight loss prior to any body contouring surgery. Her BMI goal is 30 and thus she should lose 20 lbs prior to considering liposuction, panniculectomy or abdominoplasty. We reviewed the risks of  the procedure which include but are not limited to bleeding, infection, scar, wound healing problems, asymmetry, injury to nearby structures and finally the need for further surgery.  The patient understands these risks. She agrees with the non-operative management at this time.

## 2016-08-01 ENCOUNTER — Telehealth (HOSPITAL_BASED_OUTPATIENT_CLINIC_OR_DEPARTMENT_OTHER): Payer: Self-pay | Admitting: Registered Nurse

## 2016-08-01 NOTE — Telephone Encounter (Signed)
-----   Message from Despina Pole sent at 08/01/2016 12:35 PM EDT -----  Regarding: change referral-dr reformat  [facility] [place] [id number], 51 year old, female, Telephone Information:   Home Phone      [phone]  Work Phone      Not on file.  Mobile          [phone]      Patient's Preferred Pharmacy:     [facility]  Phone: [phone] Fax: [phone]    Patient's language of care: [place])    Patient does not need an interpreter.    Patient's PCP: Malva Limes, MD    Person calling on behalf of patient: [facility]    Calls today pt was given a referral for physical therapy on a tissue of the abdominal, but [facility] does not treat this.  Pt is complaining of lower back pain, was needing Dr Reformat to change referral.

## 2016-08-23 ENCOUNTER — Ambulatory Visit (HOSPITAL_BASED_OUTPATIENT_CLINIC_OR_DEPARTMENT_OTHER): Payer: No Typology Code available for payment source | Admitting: Registered"

## 2016-08-28 ENCOUNTER — Ambulatory Visit (HOSPITAL_BASED_OUTPATIENT_CLINIC_OR_DEPARTMENT_OTHER): Payer: No Typology Code available for payment source | Admitting: Internal Medicine

## 2016-10-03 ENCOUNTER — Ambulatory Visit (HOSPITAL_BASED_OUTPATIENT_CLINIC_OR_DEPARTMENT_OTHER): Payer: No Typology Code available for payment source | Admitting: Registered"

## 2016-11-01 ENCOUNTER — Ambulatory Visit (HOSPITAL_BASED_OUTPATIENT_CLINIC_OR_DEPARTMENT_OTHER): Payer: No Typology Code available for payment source | Admitting: Registered"

## 2016-11-01 VITALS — Wt 178.0 lb

## 2016-11-01 DIAGNOSIS — E66811 Obesity, class 1: Secondary | ICD-10-CM

## 2016-11-01 DIAGNOSIS — E6609 Other obesity due to excess calories: Secondary | ICD-10-CM

## 2016-11-01 DIAGNOSIS — Z6833 Body mass index (BMI) 33.0-33.9, adult: Principal | ICD-10-CM

## 2016-11-01 NOTE — Progress Notes (Signed)
INITIAL NUTRITION ASSESSMENT / INTERVENTION    Beginning Time: 145 PM  End Time: 230 PM     Total Minutes: 45 minutes      Deborah [name] is a 51 year old female who presents with obesity.  Visit without interpreter.  The patient is from [place]. Ethnicity: [place].  Patient's language is [place] and [place]. Patient able to read [place] and [place].  She has been in the [place] for > 5 years.  Patient works in housekeeping. Patient's work schedule is full-time.  Patient lives with parents and son (94).     Subjective (including lifestyle changes): Patient reports     Wants to lose weight. Went to a Psychiatric nurse for an abdominoplasty and was told she has to lose 15-20lbs. Has tried to lose weight before by following a program (pt forgot the name of program). Notes she gained all the weight back.      Family History    Hypertension Mother     Hypertension Father     Diabetes Mother     Diabetes Maternal Uncle     Diabetes Maternal Uncle     Cancer - Other FamHxNeg     Cancer - Breast FamHxNeg     Cancer - Colon FamHxNeg     Cancer - Lung FamHxNeg     Cancer - Ovarian FamHxNeg     Heart Maternal Grandmother     Heart Maternal Uncle     Heart Maternal Uncle          Patient has had obesity for > 5 years and has had Medical Nutrition Therapy before  > 5 years at [place].      Current Outpatient Prescriptions:  metoprolol (TOPROL-XL) 100 MG 24 hr tablet Take 1 tablet by mouth daily Disp: 30 tablet Rfl: 11   cholecalciferol (VITAMIN D3) 2000 UNIT tablet Take 1 tablet by mouth daily Disp: 30 tablet Rfl: 11   omeprazole (PRILOSEC) 20 MG capsule Take 1 capsule by mouth 2 (two) times daily before meals Disp: 60 capsule Rfl: 11   Calcium Carb-Cholecalciferol 600-500 MG-UNIT CAPS Take  by mouth. Disp:  Rfl:      No current facility-administered medications for this visit.     Vitamins/Minerals/Herbal Supplements: None       Smoking status: Former Smoker  0.50 Packs/day   For 27.00 Years     Types: Cigarettes    Quit date: 06/28/2002    Smokeless tobacco: Never Used    Alcohol use No       Review of Patient's Allergies indicates:   Naproxen                Swelling    Comment:Ed visit with angioedema   Ibuprofen               Other (See Comments)    Comment:Epigastric pain, throat swelling?             05/06/12 pt states uses sometimes without issue   Pollen extract-tree*    Runny Nose    Comment:HA, itchy watery eyes    Labs reviewed: Yes  (For list of labs type "dot" LLNUTA)    Vitals:  Most Recent BP Reading(s)  07/18/16 : 127/78    Most Recent Height Reading(s)  03/08/16 : 5\' 1"  (1.549 m)     Most Recent Weight Reading(s)  03/08/16 : 80.7 kg (178 lb)     Weight change:  Most Recent Weight Reading(s)  11/01/16 : 80.7 kg (178 lb)  03/08/16 : 80.7 kg (178 lb)  02/07/16 : 80.7 kg (178 lb)  11/30/15 : 74.8 kg (165 lb)  08/24/15 : 80.7 kg (178 lb)    Estimated body mass index is 33.63 kg/m as calculated from the following:    Height as of 03/08/16: 5\' 1"  (1.549 m).    Weight as of 03/08/16: 80.7 kg (178 lb).  BMI Category: 30-34.9 obesity class I    Highest weight:        Lowest weight: <160lbs last year per pt (following a diet)    Desired weight: 160lbs         Physical Activity:  Type: physically demanding work     Barriers to physical activity include: lack of motivation    Activities of Daily Living: Independent    Chewing ability: no concerns   Swallowing ability: no concerns   Appetite: good    Food Allergies: No known allergies  Food Intolerances: none       Religious restrictions/Special diet: None  Food purchased by: patient       Food prepared by: mother and pt  Meal sources: home cooked + [place] food 2x/week + pizza 3x/week  Economic factors and food assist: none  Psychosocial factors / Mental Status: interactive with questions  Stress: Fair  Readiness to learn: motivated    Dietary history / usual intake reveals patient follows no restrictions in diet.  She eats at  irregular times and sometimes skips breakfast.    B (6-630): french bread + cheese/1 egg  L (2pm): 1 slice pizza + soda OR fruit  S: fruit OR ~10 saltines  D (7-8, at home): 2 rice + 2 beans + 6-8 oz meat + veg OR 2 pasta + salad + shrimp  S: sometimes tea/juice + bread    Water: 4/day  Soda: 1 bottle/day  Juice: some, not a lot  Pizza: 3x/week  [place] food: 2x/week    Portion size is excessive.  Eating frequency is irregular  Eating habits are culturally based and based on convenience    Estimated intake shows: sub-optimal nutrients: monosaturated fats, calcium and fiber  excessive nutrients: calories, carbohydrates, saturated fat, added sugar, fluid calories and sodium    Assessment/Plan/Conclusion:  Pt presents with obesity related to excessive caloric intake as evidenced by BMI 33 and diet recall.    Discussed with patient    Sugary drinks   Eliminate soda   Drink water   Milk: skim or 1% for increased calcium   Meal planning for 3 meals + 1 snack   Reviewed portions using food models   Lean proteins   Increasing calcium (low fat dairy)    Breakfast (6-7am): choose 1 from below   1 slice whole wheat bread + 1 egg   1/2 cup low fat cottage cheese + 1/2 cup fruit   1/2 whole wheat bagel (plain) + 1 yogurt    Lunch (12pm)   Sandwich    Meat: chicken, [place]   1 small slice cheese   Mustard   Vegetables: lettuce, tomatoes, cucumber   Bread: whole wheat sandwich thin    Snack (2-3pm): choose 1 from below   Yogurt   Low fat cottage cheese + fruit   4 saltines/triscuits + cottage cheese + fruit   4 saltines/triscuits + 1 hardboiled egg    Dinner (7-8pm)   1 cup rice/beans + 3-4 oz chicken/turkey/fish + lots of vegetables  1 cup cooked pasta + 3-4 oz shrimp + tomato sauce + large salad     Weight loss: should be gradual   1-2lbs/week    Barriers to dietary changes include: likes current diet    Client's goals: weight loss to 160lbs  1. Eliminate soda. Water only. Can drink skim or 1% milk.  2.  Limit eating out to 1x/week.  3. Walk for at least 30 minutes 5x/week    Material provided: meal plan, individual goals    Future educational needs: monitor for changes    Referred to: None    Follow-up: No follow up scheduled at this time per pt.    Ivery Quale, MS, RD, LDN

## 2016-11-01 NOTE — Patient Instructions (Signed)
Goals    1. Eliminate soda. Water only. Can drink skim or 1% milk.  2. Limit eating out to 1x/week.  3. Walk for at least 30 minutes 5x/week    Breakfast (6-7am): choose 1 from below   1 slice whole wheat bread + 1 egg   1/2 cup low fat cottage cheese + 1/2 cup fruit   1/2 whole wheat bagel (plain) + 1 yogurt    Lunch (12pm)   Sandwich    Meat: chicken, [place]   1 small slice cheese   Mustard   Vegetables: lettuce, tomatoes, cucumber   Bread: whole wheat sandwich thin    Snack (2-3pm): choose 1 from below   Yogurt   Low fat cottage cheese + fruit   4 saltines/triscuits + cottage cheese + fruit   4 saltines/triscuits + 1 hardboiled egg    Dinner (7-8pm)   1 cup rice/beans + 3-4 oz chicken/turkey/fish + lots of vegetables   1 cup cooked pasta + 3-4 oz shrimp + tomato sauce + large salad

## 2016-11-10 ENCOUNTER — Emergency Department (HOSPITAL_BASED_OUTPATIENT_CLINIC_OR_DEPARTMENT_OTHER)
Admission: RE | Admit: 2016-11-10 | Disposition: A | Discharge status: Home / Self Care | Payer: Self-pay | Source: Emergency Department | Attending: Emergency Medicine | Admitting: Emergency Medicine

## 2016-11-10 ENCOUNTER — Encounter (HOSPITAL_BASED_OUTPATIENT_CLINIC_OR_DEPARTMENT_OTHER): Payer: Self-pay

## 2016-11-10 LAB — POC URINALYSIS
BILIRUBIN, URINE: NEGATIVE
GLUCOSE,URINE: NEGATIVE
KETONE, URINE: NEGATIVE
LEUKOCYTE ESTERASE: NEGATIVE
NITRITE, URINE: NEGATIVE
PH URINE: 5 (ref 5.0–8.0)
PROTEIN, URINE: NEGATIVE
SPECIFIC GRAVITY, URINE: 1.025 (ref 1.003–1.030)
UROBILINOGEN URINE: 0.2 (ref 0.2–1.0)

## 2016-11-10 LAB — URINE PREGNANCY TEST (POINT OF CARE): HCG QUALITATIVE URINE: NEGATIVE

## 2016-11-10 LAB — XR CHEST 2 VIEWS

## 2016-11-10 MED ORDER — DIAZEPAM 5 MG PO TABS
5.00 mg | ORAL_TABLET | Freq: Once | ORAL | Status: AC
Start: 2016-11-10 — End: 2016-11-10
  Administered 2016-11-10: 5 mg via ORAL
  Filled 2016-11-10: qty 1

## 2016-11-10 MED ORDER — METHOCARBAMOL 500 MG PO TABS
500.00 mg | ORAL_TABLET | Freq: Three times a day (TID) | ORAL | 0 refills | Status: AC
Start: 2016-11-10 — End: 2016-11-15

## 2016-11-10 MED ORDER — METHOCARBAMOL 500 MG PO TABS: 500 mg | tablet | Freq: Three times a day (TID) | ORAL | 0 refills | 0 days | Status: AC

## 2016-11-10 MED ORDER — ACETAMINOPHEN 325 MG PO TABS
975.0000 mg | ORAL_TABLET | Freq: Once | ORAL | Status: AC
Start: 2016-11-10 — End: 2016-11-10
  Administered 2016-11-10: 975 mg via ORAL
  Filled 2016-11-10: qty 3

## 2016-11-10 MED ORDER — LIDOCAINE 5 % EX PTCH
1.0000 | MEDICATED_PATCH | CUTANEOUS | Status: DC
Start: 2016-11-10 — End: 2016-11-11
  Administered 2016-11-10: 1 via TOPICAL
  Filled 2016-11-10: qty 1

## 2016-11-10 NOTE — Narrator Note (Signed)
Dr Seufert at bedside

## 2016-11-10 NOTE — Narrator Note (Signed)
Pt states pain is now 4/10 with movement and that when she is still she is pain free.

## 2016-11-10 NOTE — Narrator Note (Signed)
Patient Disposition    Patient education for diagnosis, medications and follow-up.  Patient left ED 8:45 PM.  Patient  received written instructions and a prescription  for Robaxin.  Interpreter to provide instructions: Yes    Patient belongings with patient: YES    Have all existing LDAs been addressed? N/A    Have all IV infusions been stopped? N/A    Discharged ambulatory.

## 2016-11-10 NOTE — ED Triage Note (Signed)
Patient presents here today with left breast pain that is under her breast around to her axillary area.  No pain with sitting still but increases with movement.

## 2016-11-10 NOTE — Narrator Note (Addendum)
First encounter with this patient was to apply a Lidoderm Patch to her left breast.  Pt has had her EKG and chest x-ray and is resting on the stretcher.  Pt was reassessed using the interpreter.  No change in pain at this time.

## 2016-11-10 NOTE — ED Provider Notes (Signed)
I have reviewed the ED nursing notes and prior records. I have reviewed the patient's past medical history/problem list, allergies, social history and medication list.       This patient was seen with Dr. Westley Chandler and all management was discussed with them    The history, physical exam, and subsequent hospital course were conducted with the assistance of the hospital [facility] interpreter.      HPI:  This 51 year old female patient presents with chief complaint of left breast pain/pectoral pain radiating to the axilla. States that pain has been ongoing for past 3 days with movement. She thinks it feels as though she pulled a muscle though cannot remember any specific injury. No trauma. Patient states at times when she moves or breaths she feels discomfort in the chest. No exertional or tearing pain.  She denies any palpitations, dizziness, cough, hemoptysis, fevers or chills.  No unilateral calf swelling or leg pain.  Denies any recent long flights, car rides, surgery, exogenous estrogen use or history of blood clots.      ROS: Pertinent positives were reviewed as per the HPI above. All other systems were reviewed and are negative.    Past Medical History/Problem List:  Past Medical History:  No date: Esophageal reflux  No date: Irregular menstrual cycle  No date: Pregnant state, incidental      Comment: c/s breech  3/10: SVT (supraventricular tachycardia) (HCC)      Comment: [place]  Patient Active Problem List:     Lump or mass in breast     Gastroesophageal reflux disease without esophagitis     Other specified gastritis     Abdominal pain, epigastric     Angioneurotic edema not elsewhere classified     Plantar Fasciitis, L     Leg pain     Tobacco abuse, in remission     Triggering of Finger, R 3rd     Right subacromial bursitis/rotator cuff tendinopathy     Chondromalacia patellae     Lower back pain     De Quervain's Tenosynovitis, R     CTS (Carpal Tunnel Syndrome), b/l     Family history of diabetes mellitus  (DM)     Hypolipoproteinemia     Intermittent spinal claudication (HCC)     Vitamin D deficiency     Greater Trochanteric Bursitis, b/l     DDD (degenerative disc disease), lumbar     Obesity     Palpitations     Rapid Noctural Palpitations x 10 Seconds     Sweating     Microscopic hematuria     Reflux esophagitis     Lump of skin of lower extremity     Wheezing     Bilateral knee pain     Bilateral arm pain     Positive occult stool blood test     Chronic right-sided low back pain with right-sided sciatica     SVT To 240 BPM March 2010. As Of 2017, Episodes q 2 Weeks, Usually Subside One Minute, Occ 15 Minutes.  Commonly Relieved With Vagal Maneuvers.     BMI 33.0-33.9,adult      Past Surgical History:  Past Surgical History:  No date: CESAREAN DELIVERY ONLY      Comment: breech  No date: [place] BREAST BIOPSY SPECIMEN      Comment: s/p fibroadenoma on the L,   No date: SEPTOPLASTY/SUBMUCOUS RESECJ W/WO CARTILAGE GRF    Medications:     No current facility-administered medications  for this encounter.   Current Outpatient Prescriptions:  metoprolol (TOPROL-XL) 100 MG 24 hr tablet Take 1 tablet by mouth daily Disp: 30 tablet Rfl: 11   cholecalciferol (VITAMIN D3) 2000 UNIT tablet Take 1 tablet by mouth daily Disp: 30 tablet Rfl: 11   omeprazole (PRILOSEC) 20 MG capsule Take 1 capsule by mouth 2 (two) times daily before meals Disp: 60 capsule Rfl: 11   Calcium Carb-Cholecalciferol 600-500 MG-UNIT CAPS Take  by mouth. Disp:  Rfl:        Social History:     Smoking status: Former Smoker  0.50 Packs/day  For 27.00 Years     Types: Cigarettes    Quit date: 06/28/2002    Smokeless tobacco: Never Used    Alcohol use No       Allergies:  Review of Patient's Allergies indicates:   Naproxen                Swelling    Comment:Ed visit with angioedema   Ibuprofen               Other (See Comments)    Comment:Epigastric pain, throat swelling?             05/06/12 pt states uses sometimes without issue   Pollen extract-tree*    Runny  Nose    Comment:HA, itchy watery eyes    Physical Exam:  BP 119/87  Pulse 80  Temp 97.3 F  Resp 14  LMP 10/17/2006  SpO2 99%  GENERAL:  Well-appearing, no distress.  SKIN:  Warm & Dry, no rash, no bruising.  HEAD:  Atraumatic. PERRL. Anicteric sclera. Oropharynx clear.  BREAST: Left upper outer breast tender to palpation, tender along left pectoral.  No lumps masses, erythema, warmth, induration.   NECK:  Supple with full painless ROM at the neck  LUNGS:  Clear to auscultation bilaterally without rales, rhonchi or wheezing.   HEART:  RRR.  No murmurs, rubs, or gallops.   ABDOMEN:  Soft, flat, without distension.  Nontender to palpation.   MUSCULOSKELETAL:  No deformities.  2+ DP/PT/Rad pulses bilaterally. No cyanosis or edema. Non tender LE.   NEUROLOGIC:  Normal speech.  alert & oriented x 3,  Gait normal.  PSYCHIATRIC:  Normal affect    RADIOLOGY  CXR: NAD    ECG   ECG shows NSR. No ST-T wave changes indicative of ACS    MEDICATIONS ADMINISTERED ON THIS VISIT    Medication Orders Placed This Encounter      acetaminophen (TYLENOL) tablet 975 mg      diazepam (VALIUM) tablet 5 mg      lidocaine (LIDODERM) 5 % patch 1 patch      methocarbamol (ROBAXIN) 500 MG tablet          Sig: Take 1 tablet by mouth 3 (three) times daily          Dispense:  15 tablet          Refill:  0        ED Course and Medical Decision-making:    The patient is 51 year old female with left breast pain/pectoral pain for 3 days.  Feels as though pain is worse with movement.  Patient is well-appearing on exam, no distress.  Vitals are normal.  She is not tachycardic or hypoxic and has no secondary signs of DVT.  We do not suspect PE.  Patient has focal tenderness and tightness of the left pectoralis muscle left outer breast.  No evidence of mastitis or cellulitis.  We did obtain an EKG and a chest x-ray which has no abnormal findings, no signs of ischemia or pneumothorax or acute cardiopulmonary process as read by radiology.  Patient felt much  improved after Tylenol Valium and Lidoderm patch.  Her history and presentation seems most consistent with musculoskeletal strain and she was advised heat, rest, Robaxin as needed and Tylenol as needed.  Follow-up with PCP in 1 week.Reasons to return to the ED were reviewed in detail. The patient agrees with this plan and disposition.     Condition: Improved and Stable   Disposition: home    Diagnosis/Diagnoses:  Pectoralis muscle strain, initial encounter        Fabiola Backer PA-C

## 2016-11-10 NOTE — Discharge Instructions (Signed)
What we did in the Emergency Department (ED):  Exam did not show any abnormal findings on your EKG or Chest XRAY. Most likely you strained a muscle. Heat, rest and tylenol will help with your pain.     Next steps:    - Alternate doses of Tylenol (acetaminophen)   Wait 6 hours between the SAME kind of medication.  An example schedule follows:       9:00am   Tylenol (acetaminophen)  975 MG = 3 REGULAR strength tablets     3:00pm    Tylenol     9:00pm    Tylenol  (NEVER MORE THAN THREE (3) DOSES TYLENOL in one day as too much Tylenol will poison your liver!!!)    - muscle relaxant Robaxin at bedtime - NO WORK, NO ALCOHOL, NO DRIVING after taking!!!    - call our [facility] tomorrow morning to establish primary care here at [facility]:  1. Call and ask for a new patient appointment. The appointment will probably scheduled for a month from now.  2. Call your new primary care doctor's office. Tell them you were in the Emergency Department and you would like an "Emergency Department follow-up visit" for sometime this coming week.      Come back to the Emergency Department (ED) for:  Unable to pee, numbness of buttocks, weakness of either leg, can't walk, worse pain - especially if with FEVER.      Thank you for your patience.

## 2016-11-13 LAB — EKG

## 2016-11-16 ENCOUNTER — Other Ambulatory Visit (HOSPITAL_BASED_OUTPATIENT_CLINIC_OR_DEPARTMENT_OTHER): Payer: Self-pay | Admitting: Plastic Surgery

## 2016-11-16 DIAGNOSIS — E6609 Other obesity due to excess calories: Secondary | ICD-10-CM

## 2016-11-16 DIAGNOSIS — E66811 Obesity, class 1: Secondary | ICD-10-CM

## 2016-11-28 ENCOUNTER — Ambulatory Visit (HOSPITAL_BASED_OUTPATIENT_CLINIC_OR_DEPARTMENT_OTHER): Payer: No Typology Code available for payment source | Admitting: Internal Medicine

## 2016-11-28 VITALS — BP 102/60 | HR 83 | Wt 184.0 lb

## 2016-11-28 DIAGNOSIS — R002 Palpitations: Secondary | ICD-10-CM | POA: Insufficient documentation

## 2016-11-28 DIAGNOSIS — I471 Supraventricular tachycardia, unspecified: Secondary | ICD-10-CM

## 2016-11-28 MED ORDER — METOPROLOL SUCCINATE ER 100 MG PO TB24: 100 mg | tablet | Freq: Every day | ORAL | 11 refills | 0 days | Status: DC

## 2016-11-28 MED ORDER — METOPROLOL SUCCINATE ER 100 MG PO TB24
100.0000 mg | ORAL_TABLET | Freq: Every day | ORAL | 11 refills | Status: DC
Start: 2016-11-28 — End: 2017-12-24

## 2016-11-28 NOTE — Addendum Note (Signed)
Addended by: Brandy Hale on: 11/28/2016 03:29 PM     Modules accepted: Orders

## 2016-11-28 NOTE — Progress Notes (Signed)
This office note has been dictated. Account number [id number]

## 2016-11-28 NOTE — Progress Notes (Signed)
Date of Service: 11/28/2016    November 28, 2016    Deborah Limes, MD  [facility]   [place], [name] [id number]    RE:  Deborah [name]    Dear Deborah [name]:    I saw [name] in the office on 21 August in followup of her prior visit with me of 9 months ago, when I saw her regarding SVT.  Recall, she is a 51 year old [place] mother of 9, a 25 year old son, whose problem list includes GERD, rapid palpitations, SVT to 240, seen 7 years ago, and wheezing.  She is on metoprolol 50, and omeprazole 20 with allergy to Naprosyn.  Family and social history are per my prior notes with no change.  I actually increased her metoprolol to 100.  She has tolerated that well.  I noted last time when she was complaining of nocturnal palpitations that she had normal TSH, as well as CBC and CMP and then an echo from November 2017.  She had normal atrial sizes with no MR, unchanged from 4 years earlier.  A Holter was not yet available.  Her physical exam was unremarkable.    That Holter ultimately was reported out with 24 hours of data showing sinus at 80 with a range of 50 to 158.  She had a single APC and a single VPC with no AV conduction disorder, so she was not having SVT, but rather normal auscultations of rate.    Her palpitations are essentially abolished with only 1 or 2 events over the past 3 months, they last only seconds.  She does a Valsalva and tends to terminate it.     Today on exam, she stands 5 feet 1 inch and weighs 184 pounds for a BMI of 35.  Pressure 102/60 with rate 80 and room air sat 97%.  Ocular muscles are intact, and the tongue is midline with symmetric face.  No palpable cervical nodes or thyroid.  No JVD.  The chest is clear.  The heart is regular, without murmurs or gallops.  The abdomen is soft, without palpable mass or tenderness.  There is no rash, clubbing, or cyanosis, or edema.  She is fully oriented and has appropriate affect.    My impression is that of very rapid SVT up to 240 BPM, which seems  to be very well suppressed with metoprolol 100.  She is not taking any other significant meds and just omeprazole.  She is living a full life and does know how to exercise a Valsalva and finds it effective for those rare events when she does have symptoms, so I think we are in a good place and do not need to change anything.    I am pleased with her status.  I would like to see her in 6 months, but sooner if I can help.  Thank you for the privilege of participating in her care.    Sincerely,    ___________________________  Reviewed and Electronically Signed By: Deborah Hale MD  Sig Date: 12/01/2016  Sig Time: 27:06:23  Dictated By: Deborah Hale MD  Dict Date: 11/28/2016 Dict Time: 03 59 PM    Dictation Date and Time:11/28/2016 15:59:34  Transcription Date and Time:11/28/2016 18:18:38  eScription Dictation id: [id number] Confirmation # :[id number]      cc: Romona Curls MD

## 2016-12-02 ENCOUNTER — Encounter (HOSPITAL_BASED_OUTPATIENT_CLINIC_OR_DEPARTMENT_OTHER): Payer: Self-pay

## 2016-12-02 ENCOUNTER — Emergency Department (HOSPITAL_BASED_OUTPATIENT_CLINIC_OR_DEPARTMENT_OTHER)
Admission: RE | Admit: 2016-12-02 | Disposition: A | Discharge status: Home / Self Care | Payer: Self-pay | Source: Emergency Department | Attending: Emergency Medicine | Admitting: Emergency Medicine

## 2016-12-02 LAB — CBC, PLATELET & DIFFERENTIAL
ABSOLUTE BASO COUNT: 0 10*3/uL (ref 0.0–0.1)
ABSOLUTE EOSINOPHIL COUNT: 0.1 10*3/uL (ref 0.0–0.8)
ABSOLUTE IMM GRAN COUNT: 0.02 10*3/uL (ref 0.00–0.03)
ABSOLUTE LYMPH COUNT: 2.5 10*3/uL (ref 0.6–5.9)
ABSOLUTE MONO COUNT: 0.8 10*3/uL (ref 0.2–1.4)
ABSOLUTE NEUTROPHIL COUNT: 6.7 10*3/uL (ref 1.6–8.3)
BASOPHIL %: 0.3 % (ref 0.0–1.2)
EOSINOPHIL %: 0.6 % (ref 0.0–7.0)
HEMATOCRIT: 38.2 % (ref 34.1–44.9)
HEMOGLOBIN: 13 g/dL (ref 11.2–15.7)
IMMATURE GRANULOCYTE %: 0.2 % (ref 0.0–0.4)
LYMPHOCYTE %: 24.3 % (ref 15.0–54.0)
MEAN CORP HGB CONC: 34 g/dL (ref 31.0–37.0)
MEAN CORPUSCULAR HGB: 30 pg (ref 26.0–34.0)
MEAN CORPUSCULAR VOL: 88 fL (ref 80.0–100.0)
MEAN PLATELET VOLUME: 10 fL (ref 8.7–12.5)
MONOCYTE %: 8.2 % (ref 4.0–13.0)
NEUTROPHIL %: 66.4 % (ref 40.0–75.0)
PLATELET COUNT: 221 10*3/uL (ref 150–400)
RBC DISTRIBUTION WIDTH STD DEV: 40 fL (ref 35.1–46.3)
RBC DISTRIBUTION WIDTH: 12.9 % (ref 11.5–14.3)
RED BLOOD CELL COUNT: 4.34 M/uL (ref 3.90–5.20)
WHITE BLOOD CELL COUNT: 10.1 10*3/uL (ref 4.0–11.0)

## 2016-12-02 LAB — POC URINALYSIS
BILIRUBIN, URINE: NEGATIVE
GLUCOSE,URINE: NEGATIVE
KETONE, URINE: NEGATIVE
LEUKOCYTE ESTERASE: NEGATIVE
NITRITE, URINE: NEGATIVE
PH URINE: 5 (ref 5.0–8.0)
PROTEIN, URINE: NEGATIVE
SPECIFIC GRAVITY, URINE: >=1.03 (ref 1.003–1.030)
UROBILINOGEN URINE: 0.2 (ref 0.2–1.0)

## 2016-12-02 LAB — TROPONIN I
TROPONIN I: <0.02 ng/mL (ref 0.00–0.04)
TROPONIN I: <0.02 ng/mL (ref 0.00–0.04)

## 2016-12-02 LAB — BASIC METABOLIC PANEL
ANION GAP: 7 mmol/L (ref 5–15)
BUN (UREA NITROGEN): 17 mg/dL (ref 7–18)
CALCIUM: 9.5 mg/dL (ref 8.5–10.1)
CARBON DIOXIDE: 30 mmol/L (ref 21–32)
CHLORIDE: 104 mmol/L (ref 98–107)
CREATININE: 0.7 mg/dL (ref 0.4–1.2)
ESTIMATED GLOMERULAR FILT RATE: >60 mL/min (ref >60)
Glucose Random: 97 mg/dL (ref 74–160)
POTASSIUM: 4.5 mmol/L (ref 3.5–5.1)
SODIUM: 141 mmol/L (ref 136–145)

## 2016-12-02 LAB — HOLD BLUE TOP TUBE

## 2016-12-02 LAB — MAGNESIUM: MAGNESIUM: 2.4 mg/dL (ref 1.8–2.4)

## 2016-12-02 LAB — XR CHEST 2 VIEWS

## 2016-12-02 MED ORDER — FAMOTIDINE 20 MG/2ML IV SOLN
20.00 mg | Freq: Once | INTRAVENOUS | Status: AC
Start: 2016-12-02 — End: 2016-12-02
  Administered 2016-12-02: 20 mg via INTRAVENOUS
  Filled 2016-12-02: qty 2

## 2016-12-02 MED ORDER — SODIUM CHLORIDE 0.9 % IV BOLUS
1000.0000 mL | Freq: Once | INTRAVENOUS | Status: AC
Start: 2016-12-02 — End: 2016-12-02
  Administered 2016-12-02: 1000 mL via INTRAVENOUS

## 2016-12-02 NOTE — Narrator Note (Signed)
No chest pain at present time. Pt has no active vomiting. Pt alert and oriented

## 2016-12-02 NOTE — Narrator Note (Signed)
Pt resting on stretcher, family at bedside. Pt currently denies pain or discomfort. Awaiting troponin results.

## 2016-12-02 NOTE — ED Triage Note (Signed)
Pt states she had vomiting, diarrhea and abdominal pain. Pt states she has palpitations. Pt felt weak

## 2016-12-02 NOTE — ED Provider Notes (Signed)
eMERGENCY dEPARTMENT eNCOUnter    The ED nursing record was reviewed. The prior medical records as available through Epic were reviewed.  The mode of arrival was Self on 12/02/2016  2:51 PM.    This patient was seen with Emergency Department attending physician Dr. Junious Silk    CHIEF COMPLAINT    Patient presents with:  Diarrhea: VOMITING,DIARRHEA  Vomiting  Abdominal Pain: upper  Dizziness      HPI    Deborah [name] is a 51 year old female with past medical history significant for GERD, heart palpitations, SVT who presents on 12/02/2016  2:51 PM.    Patient presents for evaluation following an episode of acute dizziness.  Was at work as a Secretary/administrator, earlier in the day had gone up several stairs while carrying a heavy load.  Continued working, however became acutely dizzy where she needed to put her head down without passing out.  This was followed by a pain deep in her stomach, a single episode of diarrhea and a single episode of vomiting.  Accompanied by chest pressure that has since resolved.  Specifically denies difficulty breathing.  At present, continues to feel weak, describes "trembling inside."  Denies fevers or recent illness, current chest pain or shortness of breath, current abdominal pain.    PAST MEDICAL HISTORY    Past Medical History:  No date: Esophageal reflux  No date: Irregular menstrual cycle  No date: Pregnant state, incidental      Comment: c/s breech  3/10: SVT (supraventricular tachycardia) (HCC)      Comment: TCH    SURGICAL HISTORY    Past Surgical History:  No date: CESAREAN DELIVERY ONLY      Comment: breech  No date: [place] BREAST BIOPSY SPECIMEN      Comment: s/p fibroadenoma on the L,   No date: SEPTOPLASTY/SUBMUCOUS RESECJ W/WO CARTILAGE GRF    CURRENT MEDICATIONS      Current Facility-Administered Medications:     sodium chloride 0.9 % IV bolus 1,000 mL, 1,000 mL, Intravenous, Once, Caryl Pina E. Suhana Wilner    famotidine (PEPCID) injection 20 mg, 20 mg, Intravenous, Once, Dianna Limbo    Current Outpatient Prescriptions:     metoprolol (TOPROL-XL) 100 MG 24 hr tablet, Take 1 tablet by mouth daily, Disp: 30 tablet, Rfl: 11    cholecalciferol (VITAMIN D3) 2000 UNIT tablet, Take 1 tablet by mouth daily, Disp: 30 tablet, Rfl: 11    omeprazole (PRILOSEC) 20 MG capsule, Take 1 capsule by mouth 2 (two) times daily before meals, Disp: 60 capsule, Rfl: 11    Calcium Carb-Cholecalciferol 600-500 MG-UNIT CAPS, Take  by mouth., Disp: , Rfl:     ALLERGIES    Review of Patient's Allergies indicates:   Naproxen                Swelling    Comment:Ed visit with angioedema   Ibuprofen               Other (See Comments)    Comment:Epigastric pain, throat swelling?             05/06/12 pt states uses sometimes without issue   Pollen extract-tree*    Runny Nose    Comment:HA, itchy watery eyes    FAMILY HISTORY      Family History    Hypertension Mother     Hypertension Father     Diabetes Mother     Diabetes Maternal Uncle     Diabetes Maternal Uncle  Cancer - Other FamHxNeg     Cancer - Breast FamHxNeg     Cancer - Colon FamHxNeg     Cancer - Lung FamHxNeg     Cancer - Ovarian FamHxNeg     Heart Maternal Grandmother     Heart Maternal Uncle     Heart Maternal Uncle        SOCIAL HISTORY    Social History    Marital status: Divorced            Spouse name:                       Years of education:                 Number of children: 1             Occupational History  Occupation          Data processing manager       [employer]              Social History Main Topics    Smoking status: Former Smoker                                                                Packs/day: 0.50      Years: 27.00          Types: Cigarettes       Quit date: 06/28/2002    Smokeless tobacco: Never Used                        Alcohol use: No              Drug use: No              Sexual activity: Not Currently     Partners with: Female       Comment: no STI hx; HIV neg 2002    Other Topics             Concern  Stress Concern          Yes  Weight Concern          Yes  Special Diet            Yes  Exercise                No  Seat Belt               Yes  Self-Exams              Yes    Social History Narrative    [place], [place]. To [place] 2000.    Lives with her son and her parents. Divorced from husband.    Denies DV. No guns in home.        No regular exercise. Dental care in place.        REVIEW OF SYSTEMS    All other systems reviewed and negative except as per history of present illness    PHYSICAL EXAM    VITAL SIGNS:  Patient Vitals for the  past 72 hrs:   Temp Pulse Resp BP SpO2   12/02/16 1500 97.6 F 74 18 128/82 99 %     GENERAL:  WAWN, no acute distress, non-toxic   SKIN:  Warm & dry, no rash  HEENT:  NCAT; EOMI. Sclerae are anicteric and aninjected  LUNGS:  Clear to auscultation bilaterally. No wheezes, rales, rhonchi  HEART:  RRR.  No murmurs, rubs, or gallops.  No chest tenderness to palpation  ABDOMEN:  Soft, NTND.  No masses.  No involuntary guarding or rebound   MSK/EXTREMITIES:  No obvious deformities.  Warm and well perfused.  No cyanosis, no edema.  Upper and lower pulses 2+ bilaterally  NEUROLOGIC:  Alert, oriented; moves all extremities; speaking in sentences. Normal gait without ataxia skew normal  PSYCHIATRIC:  Appropriate for age, time of day, and situation    EKG  EKG reveals a ventricular rate of 84, NSR. PR 138 QRS 86 QTc 484.  Prolonged QT interval.  Normal axis, no heart strain and no acute ST or T wave changes indicative of ACS    RADIOLOGY  Chest x-ray-  On my visualization in advance of radiology, no acute process    IMPRESSION: No pneumothorax or pneumonia.       LAB RESULTS  Labs Reviewed   POC URINALYSIS - Abnormal; Notable for the following:        Result Value    OCCULT BLOOD, URINE SMALL (*)     All other components within normal limits   CBC, PLATELET & DIFFERENTIAL   BASIC METABOLIC PANEL   MAGNESIUM   TROPONIN I   HOLD BLUE TOP TUBE   TROPONIN I     MEDICATIONS ADMINISTERED  ON THIS VISIT    Medication Orders Placed This Encounter      sodium chloride 0.9 % IV bolus 1,000 mL      famotidine (PEPCID) injection 20 mg    ED COURSE & MEDICAL DECISION MAKING    I reviewed the patient's past medical history/problem list, past surgical history, medication list, social history and allergies. Pertinent Labs & Imaging studies reviewed. Nursing notes reviewed.  Prior records reviewed.    Deborah [name] is a 51 year old female who presented on 12/02/2016  2:51 PM for evaluation of acute dizziness. Vital signs reviewed, exam findings as above.     Patient is well appearing, in no acute distress on presentation.  Afebrile, hemodynamically stable. Physical exam within normal limits.  Plan for EKG, chest x-ray, laboratory evaluation with concern for cardiac etiology of symptoms.  IV fluids, Pepcid provided.    EKG NSR with prolonged QT interval, however no acute ST or T-wave changes indicative of ACS.  Initial troponin negative.  Chest x-ray reveals no acute findings to suggest pneumonia, pneumothorax, pleural effusion, pulmonary edema or mediastinal widening to suggest aortic pathology.  Laboratory evaluation including CBC, BMP, magnesium unrevealing.  She is without electrolyte abnormality and demonstrates adequate renal function.    On reassessment, patient reports resolution of symptoms.  4 hour troponin negative.  Etiology of her symptoms not immediately clear, however at present she appears medically stable for outpatient follow-up with cardiology.    Patient has been provided with precautions to return to the ED for new or worsening symptoms, or if unable to obtain follow up care.     FINAL IMPRESSION  Dizziness    DISPOSITION  Discharged  The patient agrees with emergency department management and verbalized understanding of instructions prior to discharge.  CONDITION  Stable, improved    Electronically signed by: Dianna Limbo, PA-C, 12/02/2016 3:31 PM  Emergency  [place]    This Emergency Department patient encounter note was created using voice-recognition software and in real time during the ED visit. Please excuse any typographical errors that have not been edited out.

## 2016-12-02 NOTE — Discharge Instructions (Signed)
You were seen in a [facility] Emergency Department for your dizziness.    Your EKG, chest x-ray, laboratory evaluation are reassuring that you do not require hospital admission today.    Call your primary care provider and cardiologist tomorrow to let them know about your emergency department visit today.     If you do not have a primary care doctor or would like to transfer your primary care to [facility], please call [phone] to set up an appointment.    Return to the emergency room for new or worsening symptoms including feeling like you will pass out, chest pain, shortness of breath, abdominal pain, or if you are unable to schedule follow up care.

## 2016-12-02 NOTE — Narrator Note (Signed)
Pt to xray

## 2016-12-02 NOTE — Narrator Note (Signed)
Patient Disposition-- Reviewed discharge instructions with patient including s/sx to return to ED, activity, diet and follow up care. Pt verbalized understanding.     Patient left ED 7:50 PM.  Patient rep received written instructions.  Interpreter to provide instructions: No    Patient belongings with patient: YES    Have all existing LDAs been addressed? Yes    Have all IV infusions been stopped? Yes    Discharged to: Discharged to home, ambulated off unit with a steady gait.

## 2016-12-02 NOTE — ED Provider Notes (Signed)
Patient seen and evaluated with the PA/resident. Please see their ED Provider Note for additional details.    Subjective:   Deborah [name] is a 51 year old female patient with h/o SVT, GERD, who presents with dizziness. While at work, felt very lightheaded, needing to sit down, but did not lose consciousness. Also with some chest pressure at the time which resolved. Had 1 episode of vomiting and diarrhea. CP now gone.     Objective:  Resting comfortably, in no distress.  Heart sounds are normal.  Lungs are clear to auscultation bilaterally.  Abdomen soft nontender.  She is ambulatory without any symptoms of lightheadedness or dizziness.  Nonfocal neurological exam.    EKG shows QTc 484, otherwise normal.    Assessment/Plan:  Exam is quite reassuring now, though she did have some chest pain and lightheadedness earlier.  Possibly due to an arrhythmia, though most likely not a dangerous one is it resolved on its own.  ACS also considered, though EKG does not show any acute ischemia, and troponin is negative 2.  Patient continues to feel very well without any further symptoms, and appears stable for outpatient management and close primary care follow-up.  Also instructed to return immediately if symptoms worsen.    Olam Idler, MD  Attending Physician  [facility] Department of Emergency Medicine

## 2016-12-02 NOTE — Narrator Note (Signed)
Pt informed she will wait for 4 hours for repeat troponin

## 2016-12-05 LAB — EKG

## 2017-01-16 ENCOUNTER — Ambulatory Visit (HOSPITAL_BASED_OUTPATIENT_CLINIC_OR_DEPARTMENT_OTHER): Payer: No Typology Code available for payment source | Admitting: Plastic Surgery

## 2017-01-29 ENCOUNTER — Other Ambulatory Visit (HOSPITAL_BASED_OUTPATIENT_CLINIC_OR_DEPARTMENT_OTHER): Payer: Self-pay | Admitting: Internal Medicine

## 2017-01-29 DIAGNOSIS — E559 Vitamin D deficiency, unspecified: Secondary | ICD-10-CM

## 2017-01-29 NOTE — Progress Notes (Signed)
PER Pharmacy, Deborah [name] is a 51 year old female has requested a refill of vitamin d2000u      Last Office Visit: 03/08/16 with Danton Clap  Last Physical Exam: 03/08/16      Other Med Adult:  Most Recent BP Reading(s)  12/02/16 : 129/80        Cholesterol (mg/dL)   Date Value   10/08/2013 192     LOW DENSITY LIPOPROTEIN DIRECT (mg/dL)   Date Value   10/08/2013 129     HIGH DENSITY LIPOPROTEIN (mg/dL)   Date Value   10/08/2013 48     TRIGLYCERIDES (mg/dL)   Date Value   10/08/2013 223 (H)         THYROID SCREEN TSH REFLEX FT4 (uIU/mL)   Date Value   02/07/2016 1.380         TSH (THYROID STIM HORMONE) (uIU/mL)   Date Value   12/22/2010 1.31       HEMOGLOBIN A1C (%)   Date Value   06/30/2008 5.2       No results found for: POCA1C      INR (no units)   Date Value   06/15/2008 1.0 (L)       SODIUM (mmol/L)   Date Value   12/02/2016 141       POTASSIUM (mmol/L)   Date Value   12/02/2016 4.5           CREATININE (mg/dL)   Date Value   12/02/2016 0.7       Documented patient preferred pharmacies:    [place], [place] [facility] [place].  Phone: [phone] Fax: [phone]

## 2017-01-31 ENCOUNTER — Encounter (HOSPITAL_BASED_OUTPATIENT_CLINIC_OR_DEPARTMENT_OTHER): Payer: Self-pay | Admitting: Physician Assistant

## 2017-01-31 ENCOUNTER — Ambulatory Visit (HOSPITAL_BASED_OUTPATIENT_CLINIC_OR_DEPARTMENT_OTHER): Payer: No Typology Code available for payment source | Admitting: Physician Assistant

## 2017-01-31 VITALS — BP 112/72 | HR 79 | Temp 98.1°F | Resp 16 | Ht 61.0 in | Wt 182.0 lb

## 2017-01-31 DIAGNOSIS — R202 Paresthesia of skin: Secondary | ICD-10-CM

## 2017-01-31 DIAGNOSIS — Z1239 Encounter for other screening for malignant neoplasm of breast: Secondary | ICD-10-CM

## 2017-01-31 DIAGNOSIS — Z1211 Encounter for screening for malignant neoplasm of colon: Secondary | ICD-10-CM

## 2017-01-31 DIAGNOSIS — E559 Vitamin D deficiency, unspecified: Secondary | ICD-10-CM

## 2017-01-31 DIAGNOSIS — Z23 Encounter for immunization: Secondary | ICD-10-CM

## 2017-01-31 DIAGNOSIS — M2550 Pain in unspecified joint: Secondary | ICD-10-CM

## 2017-01-31 DIAGNOSIS — T148XXA Other injury of unspecified body region, initial encounter: Secondary | ICD-10-CM

## 2017-01-31 LAB — RBC SEDIMENTATION RATE: RBC SEDIMENTATION RATE: 12 MM/HR (ref 0–30)

## 2017-01-31 MED ORDER — TIZANIDINE HCL 2 MG PO TABS: tablet | ORAL | 1 refills | 0 days | Status: AC

## 2017-01-31 MED ORDER — TIZANIDINE HCL 2 MG PO TABS
ORAL_TABLET | ORAL | 1 refills | Status: AC
Start: 2017-01-31 — End: 2017-02-14

## 2017-01-31 NOTE — Patient Instructions (Addendum)
Patient Education     Descompresso do tnel do carpo  (Carpal Tunnel Release)  A descompresso do tnel do carpo  um procedimento cirrgico para aliviar a dormncia e a dor em sua mo, causadas pela sndrome do tnel do carpo. O tnel do carpo  um espao estreito e oco em seu pulso. Ele passa entre os ossos do pulso e uma tira de tecido conjuntivo (ligamento carpal transverso). O nervo que controla a maior parte de sua mo (nervo mediano) passa atravs desse espao, assim como as conexes entre seus dedos e os msculos de seu brao (tendes). A sndrome do tnel do carpo faz esse espao inchar e se tornar estreito, e isso causa dor e dormncia.  Na cirurgia de descompresso do tnel do carpo, um cirurgio faz um corte atravs do ligamento carpal transverso para abrir espao no local do tnel do carpo. Voc pode realizar essa cirurgia caso outros tipos de tratamento no tiverem funcionado.  INFORME SEU MDICO SOBRE:   Qualquer alergia que voc tenha.   Todos os medicamentos que [employer], incluindo vitaminas, fitoterpicos, colrios, cremes e medicamentos de venda livre.   Problemas prvios que voc ou seus parentes j tiveram com anestesia.   Quaisquer distrbios sanguneos.   Cirurgias prvias.   Outras condies clnicas que voc tenha.    RISCOS E COMPLICAES  Esse procedimento geralmente  seguro. Mas problemas podem ocorrer, incluindo:   [place].   Infeco.   [place] no nervo mediano.   Necessidade de uma cirurgia adicional.  ANTES DO PROCEDIMENTO   Pergunte ao seu mdico sobre:  ? Mudana ou interrupo dos seus medicamentos regulares. Isso  especialmente importante se voc estiver tomando medicamentos para diabetes ou anticoagulantes.  ? Uso de medicamentos como aspirina e ibuprofeno. Esses medicamentos podem ter efeito anticoagulante. No tome esses medicamentos antes do procedimento caso seu mdico o instrua a no tom-los.   No coma nem beba nada depois da meia-noite na noite  anterior ao procedimento ou conforme orientado por seu mdico.   Pea a algum para lev-lo para casa aps o procedimento.    PROCEDIMENTO   Um tubo intravenoso poder ser inserido em uma veia.   Voc receber [employer] seguintes opes:  ? Um medicamento que adormece a rea do pulso (anestesia local). Voc tambm pode receber um medicamento para que voc relaxe (sedativo).  ? Um medicamento que far voc dormir (anestesia geral).   Seu brao, mo e pulso sero higienizados com uma soluo que mata germes (antissptico).   Seu cirurgio far um corte cirrgico (inciso) sobre o Geophysical data processor palma de seu pulso. O cirurgio ir afastar a pele de seu pulso para expor o espao do tnel do carpo.   O cirurgio ir cortar o ligamento carpal transverso.   As bordas das incises sero fechadas com pontos (suturas) ou grampos.   Uma bandagem (curativo) ser colocada sobre seu pulso e ao redor de sua mo e pulso.    DEPOIS DO PROCEDIMENTO   Voc poder passar algum tempo em uma rea de recuperao.   Sua presso arterial, frequncia cardaca, frequncia respiratria e seu nvel de oxignio no sangue sero monitorados com frequncia at [employer] passe o [employer] voc tomou.   Voc provavelmente sentir alguma dor. Voc receber medicamentos para a dor.   Voc poder precisar usar uma tala ou uma atadura de pulso sobre seu curativo.    Estas informaes no se destinam a substituir as recomendaes de seu mdico. No deixe de discutir quaisquer  dvidas com seu mdico.  Document Released: 07/19/2015 Document Revised: 07/19/2015 Document Reviewed: 11/12/2013  Elsevier Interactive Patient Education  2017 [employer].         Patient Education     Cervical Strain and Sprain Rehab  Ask your health care provider which exercises are safe for you. Do exercises exactly as told by your health care provider and adjust them as directed. It is normal to feel mild stretching, pulling, tightness, or discomfort as you do  these exercises, but you should stop right away if you feel sudden pain or your pain gets worse.Do not begin these exercises until told by your health care provider.  Stretching and range of motion exercises  These exercises warm up your muscles and joints and improve the movement and flexibility of your neck. These exercises also help to relieve pain, numbness, and tingling.  Exercise A: Cervical side bend     1. Using good posture, sit on a stable chair or stand up.  2. Without moving your shoulders, slowly tilt your left / right ear to your shoulder until you feel a stretch in your neck muscles. You should be looking straight ahead.  3. Hold for __________ seconds.  4. Repeat with the other side of your neck.  Repeat __________ times. Complete this exercise __________ times a day.  Exercise B: Cervical rotation     1. Using good posture, sit on a stable chair or stand up.  2. Slowly turn your head to the side as if you are looking over your left / right shoulder.  ? Keep your eyes level with the ground.  ? Stop when you feel a stretch along the side and the back of your neck.  3. Hold for __________ seconds.  4. Repeat this by turning to your other side.  Repeat __________ times. Complete this exercise __________ times a day.  Exercise C: Thoracic extension and pectoral stretch   1. Roll a towel or a small blanket so it is about 4 inches (10 cm) in diameter.  2. Lie down on your back on a firm surface.  3. Put the towel lengthwise, under your spine in the middle of your back. It should not be not under your shoulder blades. The towel should line up with your spine from your middle back to your lower back.  4. Put your hands behind your head and let your elbows fall out to your sides.  5. Hold for __________ seconds.  Repeat __________ times. Complete this exercise __________ times a day.  Strengthening exercises  These exercises build strength and endurance in your neck. Endurance is the ability to use your  muscles for a long time, even after your muscles get tired.  Exercise D: Upper cervical flexion, isometric   1. Lie on your back with a thin pillow behind your head and a small rolled-up towel under your neck.  2. Gently tuck your chin toward your chest and nod your head down to look toward your feet. Do not lift your head off the pillow.  3. Hold for __________ seconds.  4. Release the tension slowly. Relax your neck muscles completely before you repeat this exercise.  Repeat __________ times. Complete this exercise __________ times a day.  Exercise E: Cervical extension, isometric     1. Stand about 6 inches (15 cm) away from a wall, with your back facing the wall.  2. Place a soft object, about 6-8 inches (15-20 cm) in diameter, between the back of  your head and the wall. A soft object could be a small pillow, a ball, or a folded towel.  3. Gently tilt your head back and press into the soft object. Keep your jaw and forehead relaxed.  4. Hold for __________ seconds.  5. Release the tension slowly. Relax your neck muscles completely before you repeat this exercise.  Repeat __________ times. Complete this exercise __________ times a day.  Posture and body mechanics     Body mechanics refers to the movements and positions of your body while you do your daily activities. Posture is part of body mechanics. Good posture and healthy body mechanics can help to relieve stress in your body's tissues and joints. Good posture means that your spine is in its natural S-curve position (your spine is neutral), your shoulders are pulled back slightly, and your head is not tipped forward. The following are general guidelines for applying improved posture and body mechanics to your everyday activities.  Standing    When standing, keep your spine neutral and keep your feet about hip-width apart. Keep a slight bend in your knees. Your ears, shoulders, and hips should line up.   When you do a task in which you stand in one place for  a long time, place one foot up on a stable object that is 2-4 inches (5-10 cm) high, such as a footstool. This helps keep your spine neutral.  Sitting      When sitting, keep your spine neutral and your keep feet flat on the floor. Use a footrest, if necessary, and keep your thighs parallel to the floor. Avoid rounding your shoulders, and avoid tilting your head forward.   When working at a desk or a computer, keep your desk at a height where your hands are slightly lower than your elbows. Slide your chair under your desk so you are close enough to maintain good posture.   When working at a computer, place your monitor at a height where you are looking straight ahead and you do not have to tilt your head forward or downward to look at the screen.  Resting   When lying down and resting, avoid positions that are most painful for you. Try to support your neck in a neutral position. You can use a contour pillow or a small rolled-up towel. Your pillow should support your neck but not push on it.  This information is not intended to replace advice given to you by your health care provider. Make sure you discuss any questions you have with your health care provider.  Document Released: 03/27/2005 Document Revised: 12/02/2015 Document Reviewed: 03/03/2015  Elsevier Interactive Patient Education  2017 [employer].

## 2017-01-31 NOTE — Progress Notes (Signed)
Influenza Vaccine Procedure  January 31, 2017    1. Has the patient received the information for the influenza vaccine? Yes    2. Does the patient have any of the following contraindications?  Allergy to eggs? No  Allergic reaction to previous influenza vaccines? No  Any other problems to previous influenza vaccines? No  Paralyzed by Guillain-Barre syndrome?  No  Current moderate or severe illness? No  Allergy to contact lens solution? No    3. The vaccine has been administered in the usual fashion.     Immunization information reviewed. Current VIS reviewed and given to patient/ guardian. Verbal assent obtained from patient/ guardian.  See immunization/Injection module or chart review for date of publication and additional information. Verbal assent obtained from patient/guardian. Comfort measures for possible side effects reviewed.

## 2017-01-31 NOTE — Progress Notes (Signed)
Pt feels safe at home.  Pt given IFOB test kit.  IFOB future order placed.Deborah [name], 01/31/2017, 4:07 PM

## 2017-01-31 NOTE — Progress Notes (Signed)
Review of Systems   Constitutional: Negative for chills, fever, malaise/fatigue and weight loss.   HENT: Negative for congestion, hearing loss and tinnitus.    Eyes: Negative for blurred vision, double vision and photophobia.   Respiratory: Negative for cough and shortness of breath.    Cardiovascular: Negative for chest pain, palpitations and leg swelling.   Gastrointestinal: Negative for abdominal pain, blood in stool, constipation, diarrhea, heartburn, melena, nausea and vomiting.   Genitourinary: Negative for dysuria, frequency, hematuria and urgency.        L breast pain   Musculoskeletal: Positive for back pain, joint pain, myalgias and neck pain.   Skin: Negative for itching and rash.   Neurological: Positive for tingling and sensory change. Negative for dizziness, tremors and headaches.            Subjective     Deborah [name] is a 51 year old female presents with  Full body pain x 1 mo. Shoulders, neck, arm mm. About 2 weeks ago pain worse in medial knees R>L., pain all the time, worse with movement. Notes some swelling in R knee yesterday, a little better today. Feels warm, also shoulders feel warm.  Also with bicipital arm pain, sharp pain.  Also flare of sciatica R>L,    Tylenol q 4-6 hrs which helps until it wears off. Tight neck/shoulder muscles. Went for massage last weeks, told she had "lots of knots". Pt concerned b/c she has 2 nephews with Lupus    Intermittent bilat hand tingling/numbness x 4+ months    Has been off vit D x 2 mo,   VITAMIN D,25 HYDROXY   Date Value   06/30/2014 26 ng/mL (L)   12/22/2010 32.3 ng/ml   05/19/2009 20.0 ng/ml (L)       L lateral breast pain x 4 days, sharp shorting pains. Breasts feeling more swollen, primarily in afternoon . LMP 12 yr ago.    Patient Active Problem List:     Lump or mass in breast     Gastroesophageal reflux disease without esophagitis     Other specified gastritis     Abdominal pain, epigastric     Angioneurotic edema not elsewhere  classified     Plantar Fasciitis, L     Leg pain     Tobacco abuse, in remission     Triggering of Finger, R 3rd     Right subacromial bursitis/rotator cuff tendinopathy     Chondromalacia patellae     Lower back pain     De Quervain's Tenosynovitis, R     CTS (Carpal Tunnel Syndrome), b/l     Family history of diabetes mellitus (DM)     Hypolipoproteinemia     Intermittent spinal claudication (HCC)     Vitamin D deficiency     Greater Trochanteric Bursitis, b/l     DDD (degenerative disc disease), lumbar     Obesity     Rapid Noctural Palpitations x 10 Seconds     Sweating     Microscopic hematuria     Reflux esophagitis     Lump of skin of lower extremity     Wheezing     Bilateral knee pain     Bilateral arm pain     Positive occult stool blood test     Chronic right-sided low back pain with right-sided sciatica     SVT To 240 BPM March 2010. As Of 2017, Episodes q 2 Weeks, Usually Subside One Minute, Occ 15 Minutes.  Commonly Relieved With Vagal Maneuvers.     BMI 33.0-33.9,adult     Rapid Palpitations Only Once/Month, Lasting Seconds, Abolished by Valsalva, Aug 2018.       Current Outpatient Medications:  cholecalciferol (VITAMIN D3) 2000 UNIT tablet Take 1 tablet by mouth daily Disp: 90 tablet Rfl: 3   metoprolol (TOPROL-XL) 100 MG 24 hr tablet Take 1 tablet by mouth daily Disp: 30 tablet Rfl: 11   omeprazole (PRILOSEC) 20 MG capsule Take 1 capsule by mouth 2 (two) times daily before meals Disp: 60 capsule Rfl: 11   Calcium Carb-Cholecalciferol 600-500 MG-UNIT CAPS Take  by mouth. Disp:  Rfl:      No current facility-administered medications for this visit.   Review of Patient's Allergies indicates:   Naproxen                Swelling    Comment:Ed visit with angioedema   Ibuprofen               Other (See Comments)    Comment:Epigastric pain, throat swelling?             05/06/12 pt states uses sometimes without issue   Pollen extract-tree*    Runny Nose    Comment:HA, itchy watery eyes  Social History      Socioeconomic History    Marital status: Divorced     Spouse name: Not on file    Number of children: 1    Years of education: Not on file    Highest education level: Not on file   Social Needs    Financial resource strain: Not on file    Food insecurity - worry: Not on file    Food insecurity - inability: Not on file    Transportation needs - medical: Not on file    Transportation needs - non-medical: Not on file   Occupational History    Occupation: housecleaning     Employer: [employer]   Tobacco Use    Smoking status: Former Smoker     Packs/day: 0.50     Years: 27.00     Pack years: 13.50     Types: Cigarettes     Last attempt to quit: 06/28/2002     Years since quitting: 14.6    Smokeless tobacco: Never Used   Substance and Sexual Activity    Alcohol use: No     Alcohol/week: 0.0 oz    Drug use: No    Sexual activity: Not Currently     Partners: Male     Comment: no STI hx; HIV neg 2002   Other Topics Concern    Military Service Not Asked    Blood Transfusions Not Asked    Caffeine Concern Not Asked    Occupational Exposure Not Asked    Hobby Hazards Not Asked    Sleep Concern Not Asked    Stress Concern Yes    Weight Concern Yes    Special Diet Yes    Back Care Not Asked    Exercise No    Bike Helmet Not Asked    Seat Belt Yes    Self-Exams Yes   Social History Narrative    [place], [place]. To [place] 2000.    Lives with her son and her parents. Divorced from husband.    Denies DV. No guns in home.        No regular exercise. Dental care in place.     Past Surgical History:  No date: CESAREAN  DELIVERY ONLY      Comment:  breech  No date: [place] BREAST BIOPSY SPECIMEN      Comment:  s/p fibroadenoma on the L,   No date: SEPTOPLASTY/SUBMUCOUS RESECJ W/WO CARTILAGE GRF  Family History   Problem Relation Age of Onset    Hypertension Mother     Hypertension Father     Diabetes Mother     Diabetes Maternal Uncle     Diabetes Maternal Uncle     Cancer - Other FamHxNeg     Cancer -  Breast FamHxNeg     Cancer - Colon FamHxNeg     Cancer - Lung FamHxNeg     Cancer - Ovarian FamHxNeg     Heart Maternal Grandmother     Heart Maternal Uncle     Heart Maternal Uncle             Objective     All ROS reviewed and discussed and are otherwise negative unless listed above.       PHYSICAL EXAM:  BP 112/72  Pulse 79  Temp 98.1 F (36.7 C) (Oral)  Resp 16  Ht 5\' 1"  (1.549 m)  Wt 82.6 kg (182 lb)  LMP 10/17/2006  SpO2 99%  BMI 34.39 kg/m2    GENERAL APPEARANCE - A&Ox3, WDWN, NAD  HEENT - head normocephalic, atraumatic, EOMI, PERRLA, conjunctiva clear bilaterally, no occular discharge. TM bilaterally gray and translucent, without bulging, erythema or exudate. Light reflex visible bilaterally. Nares patent. Nasal mucosa without erythema or visible mass. Pharynx without erythema, exudate or mass.    NECK - Neck soft, supple. No ant/posterior cervical or supraclavicular LAD .  FROM  Breast: no focal immobile densities, mild generalized TTP. No skin dimpling or color changes, nipple discharge, chest wall or axillary LAD.  LUNGS - Lungs CTATB without WRR. Normal inspiratory effort.    CARDIOVASCULAR -  RRR without S3, S4. No murmurs, clicks, gallops or rubs. Back - NTTP along spinous processes or paraspinal mm.  No CVA tenderness.  Extrem - no tremor or edema. DTRs + 2 and equal UE and LE bilat. Pulses normal and equal bilat at dorsalis pedis and posterior tibial arteries  SKIN - skin color, texture, temperature, and turgor are normal in the exposed areas without rash or concerning lesions    A/P:  1. Arthralgia of multiple joints   concerning given autoimmune FH, will check labs. Unable to give nsaids. Supportive care extensively reviewed. Likely rheum referral pending labs. Monitor for/avoid possible triggers. Encouraged to keep symptom journal for f/u visit 1 mo if not resolved  - RBC SEDIMENTATION RATE  - C-REACTIVE PROTEIN  - RHEUMATOID FACTOR  - CYCLIC CITRULLIN PEPTIDE IGG  - RHEUMATOID FACTOR  TITER  - RHEUMATOID FACTOR TITER    2. Paresthesias  Etiology unclear, likely with carpal tunnel component. Braces, tylenol, ice, avoid triggers as much as able. Will r/o b12 deficiency.  - VITAMIN B12    3. Muscle strain  Pt ed extensively reviewed - exercise, heat, stretching, massage, epsom salt bath soaks, tylenol prn, mm relaxers when severe (reivewed SE/risk/benefits). F/u 1 mo, sooner prn if not resolved  - tizanidine (ZANAFLEX) 2 MG tablet; 1-2 tab every 8 hrs as needed for muscle pain and tightness  Dispense: 42 tablet; Refill: 1    4. Vitamin D deficiency  Will advise supplement dose pending results  - VITAMIN D,25 HYDROXY    5. Special screening for malignant neoplasms, colon  - POC IMMUNOASSAY FECAL OCCULT BLOOD  TEST; Future    6. Screening for breast cancer  - DIGITAL SCREEN MAM WCAD&TOM; Future    7. Need for prophylactic vaccination and inoculation against influenza  - IMMUNIZATION ADMIN SINGLE, RN  - PR IIV4 VACC PRESRV FREE 0.5 ML DOS FOR IM USE        I have reviewed the past medical, surgical, social and family history and updated these sections of EpicCare as relevant. All interim labs, test results, and consult notes were reviewed and discussed with patient. Medications were reconciled during this visit and a current medication list was given to the patient at the end of the visit.      follow up as above    Ernie Avena, PA-C

## 2017-02-01 LAB — VITAMIN B12: VITAMIN B12: 636 pg/mL (ref 193–986)

## 2017-02-01 LAB — CYCLIC CITRULLIN PEPTIDE IGG: CYCLIC CITRULLIN PEPTIDE IgG: <0.5 U/mL (ref 0.0–2.9)

## 2017-02-01 LAB — RHEUMATOID FACTOR: RHEUMATOID FACTOR: POSITIVE — AB

## 2017-02-01 LAB — RHEUMATOID FACTOR TITER: RHEUMATOID FACTOR TITER: 1:2 {titer}

## 2017-02-01 LAB — C-REACTIVE PROTEIN: C-REACTIVE PROTEIN: 0.8 mg/dL (ref 0.0–1.8)

## 2017-02-01 LAB — VITAMIN D,25 HYDROXY: VITAMIN D,25 HYDROXY: 23 ng/mL — ABNORMAL LOW (ref 30.0–100.0)

## 2017-02-05 ENCOUNTER — Other Ambulatory Visit (HOSPITAL_BASED_OUTPATIENT_CLINIC_OR_DEPARTMENT_OTHER): Payer: Self-pay | Admitting: Physician Assistant

## 2017-02-05 DIAGNOSIS — M1990 Unspecified osteoarthritis, unspecified site: Secondary | ICD-10-CM

## 2017-02-05 DIAGNOSIS — R768 Other specified abnormal immunological findings in serum: Secondary | ICD-10-CM

## 2017-02-06 ENCOUNTER — Telehealth (HOSPITAL_BASED_OUTPATIENT_CLINIC_OR_DEPARTMENT_OTHER): Payer: Self-pay | Admitting: Registered Nurse

## 2017-02-06 NOTE — Progress Notes (Signed)
Excell Seltzer, RN, 02/06/2017, 1:52 PM  TC to pt.  We reviewed all results.  Has Vitamin D at home, will be sure to take daily.  Asking for Lorre Nick to help book Rheum appt.  Lucy:  pls help pt to book.  Tx

## 2017-02-06 NOTE — Progress Notes (Signed)
Result Comments in [place]     Written by Ernie Avena on 02/05/2017 10:08 AM   Hi Deborah [name] from visit look good, except for a low vitamin D level and a positive rheumatoid test. I'm going to place a referral for you to follow up with a rheumatologist (specialist) so that we can see if this might be rheumatoid arthritis. Your other rheumatoid labs are normal so it's not clear from these results if that is an appropriate diagnosis. Please continue plan as discussed at visit, otherwise. Below is contact info - you can call to schedule your appt.   Thanks,   Margreta Journey     A referral to a [employer] specialist was placed for you today.     To schedule your appointment with the specialist, please cal the appropriate number below.   Orthopedics: [phone] to schedule at [place] for:   Orthopedic Surgery, Physiatry. Rheumatology       If you need help or have problems making an appointment, please call our clinic and ask to speak with referral coordinator [phone]

## 2017-02-06 NOTE — Telephone Encounter (Signed)
-----   Message from Randel Books sent at 02/06/2017  1:17 PM EDT -----  Regarding: Test Result  Contact: [phone]  Deborah [name] [id number], 51 year old, female    Calls today:  Clinical Questions (NON-SICK CLINICAL QUESTIONS ONLY)    Name of person calling Pt  Specific nature of request she wants to talk about her result.  Return phone number [phone]    Person calling on behalf of patient: Patient (self)    CALL BACK NUMBER: [phone]    Best time to call back: anytime  Cell phone:   Other phone:    Patient's language of care: [place]    Patient does not need an interpreter.    Patient's PCP: Malva Limes, MD

## 2017-02-13 LAB — MA SCREENING MAMMO BILATERAL DIGITAL WITH DBT & CAD

## 2017-02-13 NOTE — Addendum Note (Signed)
Addended by: Tami Lin on: 02/13/2017 09:13 AM     Modules accepted: Orders

## 2017-03-13 ENCOUNTER — Ambulatory Visit (HOSPITAL_BASED_OUTPATIENT_CLINIC_OR_DEPARTMENT_OTHER): Payer: No Typology Code available for payment source | Admitting: Internal Medicine

## 2017-03-13 ENCOUNTER — Encounter (HOSPITAL_BASED_OUTPATIENT_CLINIC_OR_DEPARTMENT_OTHER): Payer: Self-pay | Admitting: Internal Medicine

## 2017-03-13 VITALS — BP 109/73 | HR 80 | Temp 97.8°F | Resp 14 | Ht 61.0 in | Wt 187.0 lb

## 2017-03-13 DIAGNOSIS — I471 Supraventricular tachycardia, unspecified: Secondary | ICD-10-CM

## 2017-03-13 DIAGNOSIS — H547 Unspecified visual loss: Secondary | ICD-10-CM

## 2017-03-13 DIAGNOSIS — K2990 Gastroduodenitis, unspecified, without bleeding: Secondary | ICD-10-CM

## 2017-03-13 DIAGNOSIS — Z0000 Encounter for general adult medical examination without abnormal findings: Secondary | ICD-10-CM

## 2017-03-13 DIAGNOSIS — L918 Other hypertrophic disorders of the skin: Secondary | ICD-10-CM

## 2017-03-13 DIAGNOSIS — Z1211 Encounter for screening for malignant neoplasm of colon: Secondary | ICD-10-CM

## 2017-03-13 DIAGNOSIS — M2550 Pain in unspecified joint: Secondary | ICD-10-CM

## 2017-03-13 DIAGNOSIS — K2970 Gastritis, unspecified, without bleeding: Secondary | ICD-10-CM

## 2017-03-13 DIAGNOSIS — E559 Vitamin D deficiency, unspecified: Secondary | ICD-10-CM

## 2017-03-13 DIAGNOSIS — Z6833 Body mass index (BMI) 33.0-33.9, adult: Secondary | ICD-10-CM

## 2017-03-13 DIAGNOSIS — K219 Gastro-esophageal reflux disease without esophagitis: Secondary | ICD-10-CM

## 2017-03-13 DIAGNOSIS — R1013 Epigastric pain: Secondary | ICD-10-CM

## 2017-03-13 MED ORDER — OMEPRAZOLE 20 MG PO CPDR
20.0000 mg | DELAYED_RELEASE_CAPSULE | Freq: Two times a day (BID) | ORAL | 11 refills | Status: DC
Start: 2017-03-13 — End: 2018-09-17

## 2017-03-13 MED ORDER — DICLOFENAC SODIUM 1 % TD GEL: 2 g | g | Freq: Two times a day (BID) | 1 refills | 0 days | Status: AC

## 2017-03-13 MED ORDER — CALCIUM 600-200 MG-UNIT PO TABS
1.0000 | ORAL_TABLET | Freq: Two times a day (BID) | ORAL | 11 refills | Status: AC
Start: 2017-03-13 — End: 2018-03-08

## 2017-03-13 MED ORDER — OMEPRAZOLE 20 MG PO CPDR: 20 mg | capsule | Freq: Two times a day (BID) | ORAL | 11 refills | 0 days | Status: AC

## 2017-03-13 MED ORDER — DICLOFENAC SODIUM 1 % TD GEL
2.00 g | Freq: Two times a day (BID) | TRANSDERMAL | 1 refills | Status: AC
Start: 2017-03-13 — End: 2017-05-12

## 2017-03-13 MED ORDER — CALCIUM 600-200 MG-UNIT PO TABS: 1 | tablet | Freq: Two times a day (BID) | ORAL | 11 refills | 0 days | Status: AC

## 2017-03-13 NOTE — Patient Instructions (Signed)
A referral to a [facility] specialist was placed for you today.    To schedule your appointment with the specialist, please cal the appropriate number below.    [facility]  [place] --- [phone]  [place] --- [phone]      If you need help or have problems making an appointment, please call our clinic and ask to speak with referral coordinator #[phone]

## 2017-03-13 NOTE — Progress Notes (Signed)
Deborah [name] is a 51 year old female    Review of Patient's Allergies indicates:   Naproxen                Swelling    Comment:Ed visit with angioedema   Ibuprofen               Other (See Comments)    Comment:Epigastric pain, throat swelling?             05/06/12 pt states uses sometimes without issue   Pollen extract-tree*    Runny Nose    Comment:HA, itchy watery eyes      Current Outpatient Medications:  Calcium 600-200 MG-UNIT per tablet Take 1 tablet by mouth 2 (two) times daily Disp: 60 tablet Rfl: 11   omeprazole (PRILOSEC) 20 MG capsule Take 1 capsule by mouth 2 (two) times daily before meals Disp: 60 capsule Rfl: 11   cholecalciferol (VITAMIN D3) 2000 UNIT tablet Take 1 tablet by mouth daily Disp: 90 tablet Rfl: 3   metoprolol (TOPROL-XL) 100 MG 24 hr tablet Take 1 tablet by mouth daily Disp: 30 tablet Rfl: 11   Calcium Carb-Cholecalciferol 600-500 MG-UNIT CAPS Take  by mouth. Disp:  Rfl:      No current facility-administered medications for this visit.   Patient Active Problem List:     Lump or mass in breast     Gastroesophageal reflux disease without esophagitis     Other specified gastritis     Abdominal pain, epigastric     Angioneurotic edema not elsewhere classified     Plantar Fasciitis, L     Leg pain     Tobacco abuse, in remission     Triggering of Finger, R 3rd     Right subacromial bursitis/rotator cuff tendinopathy     Chondromalacia patellae     Lower back pain     De Quervain's Tenosynovitis, R     CTS (Carpal Tunnel Syndrome), b/l     Family history of diabetes mellitus (DM)     Hypolipoproteinemia     Intermittent spinal claudication (HCC)     Vitamin D deficiency     Greater Trochanteric Bursitis, b/l     DDD (degenerative disc disease), lumbar     Obesity     Rapid Noctural Palpitations x 10 Seconds     Sweating     Microscopic hematuria     Reflux esophagitis     Lump of skin of lower extremity     Wheezing     Bilateral knee pain     Bilateral arm pain     Positive occult stool  blood test     Chronic right-sided low back pain with right-sided sciatica     SVT To 240 BPM March 2010. As Of 2017, Episodes q 2 Weeks, Usually Subside One Minute, Occ 15 Minutes.  Commonly Relieved With Vagal Maneuvers.     BMI 33.0-33.9,adult     Rapid Palpitations Only Once/Month, Lasting Seconds, Abolished by Valsalva, Aug 2018.     Social History     Socioeconomic History    Marital status: Divorced     Spouse name: Not on file    Number of children: 1    Years of education: Not on file    Highest education level: Not on file   Social Needs    Financial resource strain: Not on file    Food insecurity - worry: Not on file    Food insecurity - inability:  Not on file    Transportation needs - medical: Not on file    Transportation needs - non-medical: Not on file   Occupational History    Occupation: housecleaning     Employer: [employer]   Tobacco Use    Smoking status: Former Smoker     Packs/day: 0.50     Years: 27.00     Pack years: 13.50     Types: Cigarettes     Last attempt to quit: 06/28/2002     Years since quitting: 14.7    Smokeless tobacco: Never Used   Substance and Sexual Activity    Alcohol use: No     Alcohol/week: 0.0 oz    Drug use: No    Sexual activity: Not Currently     Partners: Male     Comment: no STI hx; HIV neg 2002   Other Topics Concern    Military Service Not Asked    Blood Transfusions Not Asked    Caffeine Concern Not Asked    Occupational Exposure Not Asked    Hobby Hazards Not Asked    Sleep Concern Not Asked    Stress Concern Yes    Weight Concern Yes    Special Diet Yes    Back Care Not Asked    Exercise No    Bike Helmet Not Asked    Seat Belt Yes    Self-Exams Yes   Social History Narrative    [place], [place]. To [place] 2000.    Lives with her son and her parents. Divorced from husband.    Denies DV. No guns in home.        No regular exercise. Dental care in place.     Pt here for CPE and f/u re GERD, SVT, obesity, joint pain and abdominal  pain    Notes continued chronic burning sensation at epigastrium, frequently rising to throat, but with only episodic epigastric pain. Notes frequent burping and nausea without vomiting, diarrhea, but saw some BRBPR after stooling once a week ago, but not since or before. Symptoms are exacerbated by late night eating and coffee, tomato and white what product consumption. Notes symptoms are improved on Omeprazole bid.    She is not exercising regularly. Denies any CP or palpitations, but notes occasional tachycardia resolving with exercise.    Notes continued pain at knees b/l, especially with stairs. She also notes continued R shoulder and R foot pain, as well as triggering at R third finger and pain at L basal joint.    Notes worsening visual acuity.    Notes multiple skin tags at neck without pain or rash.    Physical Exam   Constitutional: She is well-developed, well-nourished, and in no distress. No distress.   HENT:   Head: Normocephalic and atraumatic.   Pulmonary/Chest: Effort normal.   Abdominal: Soft. Bowel sounds are normal. She exhibits no distension and no mass. There is no tenderness. There is no rebound and no guarding.   Neurological: She is alert.   Skin: Skin is warm and dry. She is not diaphoretic.   Psychiatric: Affect normal.   Vitals reviewed.     03/13/17  1603   BP: 109/73   Site: Left Arm   Position: Sitting   Cuff Size: Regular   Pulse: 80   Resp: 14   Temp: 97.8 F (36.6 C)   TempSrc: Temporal   SpO2: 98%   Weight: 84.8 kg (187 lb)   Height: 5\' 1"  (1.549 m)     (  Z00.00) Physical exam, routine  (primary encounter diagnosis)  Comment: obese female  Plan: next CPE >03/13/18    (Z12.11) Special screening for malignant neoplasms, colon  Comment: with recent BRBPR; no FHx+  Plan: POC IMMUNOASSAY FECAL OCCULT BLOOD TEST        Reviewed need for screening    (M19.62) BMI 33.0-33.9,adult  Comment: chronic, with GERD likely exacerbated by this  Plan: counseled for weight loss of 10 lbs to begin  with    (I47.1) SVT To 240 BPM March 2010. As Of 2017, Episodes q 2 Weeks, Usually Subside One Minute, Occ 15 Minutes.  Commonly Relieved With Vagal Maneuvers.  Comment: symptoms stable on current BB dose  Plan: continued current metoprolol dose    (E55.9) Vitamin D deficiency  Comment: prev noted, on supplement  Plan: Calcium 600-200 MG-UNIT per tablet        Counseled re need for calcium supplementation, especially on PPI    (K29.70,  K29.90) Gastritis and gastroduodenitis  Comment: chronic, with EGD 2014 showing only gastropathy  Plan: omeprazole (PRILOSEC) 20 MG capsule        Continue PPI; avoid triggers    (K21.9) Gastroesophageal reflux disease without esophagitis  Comment: as above; pt notes that she felt much improved after previous weight loss  Plan: omeprazole (PRILOSEC) 20 MG capsule        As above    (R10.13) Abdominal pain, epigastric  Comment: rare, with PPI in use  Plan: monitor    (M25.50) Polyarthralgia  Comment: at large and small joints, with recent RF+  Plan: trial diclofenac (VOLTAREN) 1 % GEL Gel as patient should avoid PO NSAIDs with GERD    (H54.7) Visual acuity reduced  Comment: worsening visual acuity OU  Plan: REFERRAL TO OPHTHALMOLOGY ( INT)    (L91.8) Acrochordon  Comment: multiple at neck, benign appearing and not bothersome  Plan: counseled to observe and f/u only if bothersome    I have reviewed the past medical, surgical, social and family history and updated these sections of EpicCare as relevant. All interim labs, test results, and consult notes were reviewed and discussed with Marlou Sa. Medications were reconciled during this visit and a current medication list was given to the patient at the end of the visit.    F/u c PCP prn and as directed    I have spent >40 minutes in face to face time with this patient/patient proxy of which > 50% was in counseling or coordination of care regarding above issues/Dx.

## 2017-03-13 NOTE — Progress Notes (Signed)
Pt feels safe at home.  Deborah [name], 03/13/2017, 4:03 PM

## 2017-03-21 ENCOUNTER — Ambulatory Visit (HOSPITAL_BASED_OUTPATIENT_CLINIC_OR_DEPARTMENT_OTHER): Payer: No Typology Code available for payment source

## 2017-03-21 ENCOUNTER — Ambulatory Visit (HOSPITAL_BASED_OUTPATIENT_CLINIC_OR_DEPARTMENT_OTHER): Payer: Self-pay | Admitting: Physician Assistant

## 2017-03-21 DIAGNOSIS — Z1211 Encounter for screening for malignant neoplasm of colon: Secondary | ICD-10-CM

## 2017-03-21 DIAGNOSIS — Z1239 Encounter for other screening for malignant neoplasm of breast: Secondary | ICD-10-CM

## 2017-03-21 LAB — POC IMMUNOASSAY FECAL OCCULT BLOOD TEST: POC FECAL OCCULT BLOOD TEST (IMMUNOASSAY): NEGATIVE

## 2017-03-21 NOTE — Progress Notes (Signed)
IFOB resulted neg. AM letter sent

## 2017-04-06 ENCOUNTER — Emergency Department (HOSPITAL_BASED_OUTPATIENT_CLINIC_OR_DEPARTMENT_OTHER)
Admission: RE | Admit: 2017-04-06 | Disposition: A | Discharge status: Home / Self Care | Payer: Self-pay | Source: Emergency Department | Attending: Emergency Medicine | Admitting: Emergency Medicine

## 2017-04-06 ENCOUNTER — Encounter (HOSPITAL_BASED_OUTPATIENT_CLINIC_OR_DEPARTMENT_OTHER): Payer: Self-pay

## 2017-04-06 LAB — POC URINALYSIS
GLUCOSE,URINE: NEGATIVE
LEUKOCYTE ESTERASE: NEGATIVE
NITRITE, URINE: NEGATIVE
PH URINE: 5.5 (ref 5.0–8.0)
PROTEIN, URINE: NEGATIVE
SPECIFIC GRAVITY, URINE: >=1.03 (ref 1.003–1.030)
UROBILINOGEN URINE: 0.2 (ref 0.2–1.0)

## 2017-04-06 LAB — URINE PREGNANCY TEST (POINT OF CARE): HCG QUALITATIVE URINE: NEGATIVE

## 2017-04-06 LAB — CBC, PLATELET & DIFFERENTIAL
ABSOLUTE BASO COUNT: 0 10*3/uL (ref 0.0–0.1)
ABSOLUTE EOSINOPHIL COUNT: 0.1 10*3/uL (ref 0.0–0.8)
ABSOLUTE IMM GRAN COUNT: 0.02 10*3/uL (ref 0.00–0.03)
ABSOLUTE LYMPH COUNT: 2.2 10*3/uL (ref 0.6–5.9)
ABSOLUTE MONO COUNT: 0.6 10*3/uL (ref 0.2–1.4)
ABSOLUTE NEUTROPHIL COUNT: 7.5 10*3/uL (ref 1.6–8.3)
BASOPHIL %: 0.2 % (ref 0.0–1.2)
EOSINOPHIL %: 0.6 % (ref 0.0–7.0)
HEMATOCRIT: 40.8 % (ref 34.1–44.9)
HEMOGLOBIN: 13.6 g/dL (ref 11.2–15.7)
IMMATURE GRANULOCYTE %: 0.2 % (ref 0.0–0.4)
LYMPHOCYTE %: 21.4 % (ref 15.0–54.0)
MEAN CORP HGB CONC: 33.3 g/dL (ref 31.0–37.0)
MEAN CORPUSCULAR HGB: 29.2 pg (ref 26.0–34.0)
MEAN CORPUSCULAR VOL: 87.6 fL (ref 80.0–100.0)
MEAN PLATELET VOLUME: 9.9 fL (ref 8.7–12.5)
MONOCYTE %: 5.9 % (ref 4.0–13.0)
NEUTROPHIL %: 71.7 % (ref 40.0–75.0)
PLATELET COUNT: 208 10*3/uL (ref 150–400)
RBC DISTRIBUTION WIDTH STD DEV: 38.8 fL (ref 35.1–46.3)
RBC DISTRIBUTION WIDTH: 12.4 % (ref 11.5–14.3)
RED BLOOD CELL COUNT: 4.66 M/uL (ref 3.90–5.20)
WHITE BLOOD CELL COUNT: 10.4 10*3/uL (ref 4.0–11.0)

## 2017-04-06 LAB — COMPREHENSIVE METABOLIC PANEL
ALANINE AMINOTRANSFERASE: 43 U/L (ref 12–45)
ALBUMIN: 4.2 g/dL (ref 3.4–5.0)
ALKALINE PHOSPHATASE: 117 U/L (ref 45–117)
ANION GAP: 15 mmol/L (ref 5–15)
ASPARTATE AMINOTRANSFERASE: 28 U/L (ref 8–34)
BILIRUBIN TOTAL: 0.3 mg/dL (ref 0.2–1.0)
BUN (UREA NITROGEN): 19 mg/dL — ABNORMAL HIGH (ref 7–18)
CALCIUM: 9.5 mg/dL (ref 8.5–10.1)
CARBON DIOXIDE: 26 mmol/L (ref 21–32)
CHLORIDE: 102 mmol/L (ref 98–107)
CREATININE: 0.9 mg/dL (ref 0.4–1.2)
ESTIMATED GLOMERULAR FILT RATE: >60 mL/min (ref >60)
Glucose Random: 114 mg/dL (ref 74–160)
POTASSIUM: 3.3 mmol/L — ABNORMAL LOW (ref 3.5–5.1)
SODIUM: 143 mmol/L (ref 136–145)
TOTAL PROTEIN: 8.1 g/dL (ref 6.4–8.2)

## 2017-04-06 LAB — LIPASE: LIPASE: 213 U/L (ref 73–393)

## 2017-04-06 LAB — TROPONIN I
TROPONIN I: <0.02 ng/mL (ref 0.00–0.04)
TROPONIN I: <0.02 ng/mL (ref 0.00–0.04)

## 2017-04-06 LAB — MAGNESIUM: MAGNESIUM: 1.9 mg/dL (ref 1.8–2.4)

## 2017-04-06 MED ORDER — SODIUM CHLORIDE 0.9 % IV BOLUS
500.00 mL | Freq: Once | INTRAVENOUS | Status: AC
Start: 2017-04-06 — End: 2017-04-06
  Administered 2017-04-06: 500 mL via INTRAVENOUS

## 2017-04-06 MED ORDER — POTASSIUM CHLORIDE 20 MEQ/15ML (10%) PO SOLN
40.00 meq | Freq: Once | ORAL | Status: AC
Start: 2017-04-06 — End: 2017-04-06
  Administered 2017-04-06: 40 meq via ORAL
  Filled 2017-04-06: qty 30

## 2017-04-06 NOTE — Narrator Note (Signed)
Patient Disposition    Patient education for diagnosis, medications, activity, diet and follow-up.  Patient left ED 9:19 PM.  Patient rep received written instructions.  Interpreter to provide instructions: No    Patient belongings with patient: YES    Have all existing LDAs been addressed? Yes    Have all IV infusions been stopped? Yes    Discharged to: Discharged to home

## 2017-04-06 NOTE — Narrator Note (Signed)
Discharge instructions given to the patient.  Patient to return here if she experiences chest pain or shortness of breath otherwise she will follow up with her PCP as scheduled.  Patient discharged to home ambulatory.

## 2017-04-06 NOTE — ED Provider Notes (Signed)
Emergency Department Attending Provider Note      The patient was seen primarily by me. ED nursing record was reviewed. Prior records as available electronically through the Epic record were reviewed.         HPI:    This 51 year old female patient presents with palpitations, vomiting and loose stool.    Felt a bit tired in the afternoon after work.  As she was driving home felt palpitation associated with stomach upset, this lasted for several minutes.  She got home and felt increased palpitations as she ran up stairs.  She performed a vagal maneuver (finger down throat) as told by her cardiologist for SVT, and at the same time had a loose BM without blood.  She endorsed some lower chest and diffuse abd crampy pain during this at the peak of the palpitations, however it resolved after she vagal maneuvered herself with resolution of palpitations.  Felt fatigued after this episode, but denies syncope.  Currently just feels a bit fatigued, without chest pain, sob, abd pain, pleuritic pains, calf swelling or any other complaints.  Reports compliance with metoprolol.  No fever.        ROS:  Pertinent positives were reviewed as per the HPI above. All other systems were reviewed and are negative       Past Medical History/Problem list:  Past Medical History:   Diagnosis Date    Esophageal reflux     Irregular menstrual cycle     Pregnant state, incidental     c/s breech    SVT (supraventricular tachycardia) ([facility]) 3/10    [place]     Patient Active Problem List:     Lump or mass in breast     Gastroesophageal reflux disease without esophagitis     Other specified gastritis     Abdominal pain, epigastric     Angioneurotic edema not elsewhere classified     Plantar Fasciitis, L     Leg pain     Tobacco abuse, in remission     Triggering of Finger, R 3rd     Right subacromial bursitis/rotator cuff tendinopathy     Chondromalacia patellae     Lower back pain     De Quervain's Tenosynovitis, R     CTS (Carpal Tunnel Syndrome),  b/l     Family history of diabetes mellitus (DM)     Hypolipoproteinemia     Intermittent spinal claudication (HCC)     Vitamin D deficiency     Greater Trochanteric Bursitis, b/l     DDD (degenerative disc disease), lumbar     Obesity     Rapid Noctural Palpitations x 10 Seconds     Sweating     Microscopic hematuria     Reflux esophagitis     Lump of skin of lower extremity     Bilateral knee pain     Bilateral arm pain     Positive occult stool blood test     Chronic right-sided low back pain with right-sided sciatica     SVT To 240 BPM March 2010. As Of 2017, Episodes q 2 Weeks, Usually Subside One Minute, Occ 15 Minutes.  Commonly Relieved With Vagal Maneuvers.     BMI 33.0-33.9,adult     Rapid Palpitations Only Once/Month, Lasting Seconds, Abolished by Valsalva, Aug 2018.      Polyarthralgia      Past Surgical History: Past Surgical History:  No date: C[facility] DELIVERY ONLY      Comment:  breech  No date: [place] BREAST BIOPSY SPECIMEN      Comment:  s/p fibroadenoma on the L,   No date: SEPTOPLASTY/SUBMUCOUS RESECJ W/WO CARTILAGE GRF      Medications:   Current Facility-Administered Medications:  sodium chloride 0.9 % IV bolus 500 mL 500 mL Intravenous Once Nyoka Lint, MD     Current Outpatient Medications:  Calcium 600-200 MG-UNIT per tablet Take 1 tablet by mouth 2 (two) times daily Disp: 60 tablet Rfl: 11   omeprazole (PRILOSEC) 20 MG capsule Take 1 capsule by mouth 2 (two) times daily before meals Disp: 60 capsule Rfl: 11   diclofenac (VOLTAREN) 1 % GEL Gel Apply 2 g topically 2 (two) times daily Do Not Exceed 32 g in 24 Hours Disp: 100 g Rfl: 1   cholecalciferol (VITAMIN D3) 2000 UNIT tablet Take 1 tablet by mouth daily Disp: 90 tablet Rfl: 3   metoprolol (TOPROL-XL) 100 MG 24 hr tablet Take 1 tablet by mouth daily Disp: 30 tablet Rfl: 11   Calcium Carb-Cholecalciferol 600-500 MG-UNIT CAPS Take  by mouth. Disp:  Rfl:          Social History:   Social History     Socioeconomic History    Marital status:  Divorced     Spouse name: Not on file    Number of children: 1    Years of education: Not on file    Highest education level: Not on file   Social Needs    Financial resource strain: Not on file    Food insecurity - worry: Not on file    Food insecurity - inability: Not on file    Transportation needs - medical: Not on file    Transportation needs - non-medical: Not on file   Occupational History    Occupation: housecleaning     Employer: [employer]   Tobacco Use    Smoking status: Former Smoker     Packs/day: 0.50     Years: 27.00     Pack years: 13.50     Types: Cigarettes     Last attempt to quit: 06/28/2002     Years since quitting: 14.7    Smokeless tobacco: Never Used   Substance and Sexual Activity    Alcohol use: No     Alcohol/week: 0.0 oz    Drug use: No    Sexual activity: Not Currently     Partners: Male     Comment: no STI hx; HIV neg 2002   Other Topics Concern    Military Service Not Asked    Blood Transfusions Not Asked    Caffeine Concern Not Asked    Occupational Exposure Not Asked    Hobby Hazards Not Asked    Sleep Concern Not Asked    Stress Concern Yes    Weight Concern Yes    Special Diet Yes    Back Care Not Asked    Exercise No    Bike Helmet Not Asked    Seat Belt Yes    Self-Exams Yes   Social History Narrative    [place], [place]. To [place] 2000.    Lives with her son and her parents. Divorced from husband.    Denies DV. No guns in home.        No regular exercise. Dental care in place.         Family History:     Reviewed as per HPI, otherwise noncontributory    Allergies:  Review of Patient's  Allergies indicates:   Naproxen                Swelling    Comment:Ed visit with angioedema   Ibuprofen               Other (See Comments)    Comment:Epigastric pain, throat swelling?             05/06/12 pt states uses sometimes without issue   Pollen extract-tree*    Runny Nose    Comment:HA, itchy watery eyes      Physical Exam:                 Patient Vitals for the past  720 hrs:   Temp Pulse Resp BP SpO2   04/06/17 1714 97.7 F 76 14 145/78 100 %       GENERAL:  WDWN, no acute distress, non-toxic   SKIN:  Warm & Dry, no rash, no petechia.    HEAD:  Normocephalic, atraumatic, no contusions or ecchymosis. PERRL x 2  Sclerae are anicteric and aninjected, moist mucous membrane  Airway is patent.   NECK:  Full ROM, No meningismus.    LUNGS:  Clear to auscultation bilaterally. No wheezes, rales, rhonchi.   HEART:  RRR.  S1 and 2 present, Radial pulses 2+ equal bilaterally, no murmurs  ABDOMEN:  Soft, Nontender, Nondistended.  No masses.  No involuntary guarding or rebound  EXTREMITIES:  No obvious deformities.  Warm and well perfused.  No edema. No calf tenderness  BACK: Nontender on palpation, atraumatic  NEUROLOGIC:  Alert and oriented x4; moves all extremities well; speaking in clear fluent sentences.   Normal gait without ataxia;  Nonfocal, 5/5 strength globally. CNsII-XII symmetrical and intact, Sensation intact to light touch throughout.   PSYCHIATRIC:  Appropriate for age, time of day, and situation      Results:    Labs Reviewed   POC URINALYSIS - Abnormal; Notable for the following components:       Result Value    BILIRUBIN, URINE SMALL (*)     KETONE, URINE TRACE (*)     OCCULT BLOOD, URINE TRACE-INTACT (*)     All other components within normal limits   COMPREHENSIVE METABOLIC PANEL - Abnormal; Notable for the following components:    POTASSIUM 3.3 (*)     BUN (UREA NITROGEN) 19 (*)     All other components within normal limits   CBC, PLATELET & DIFFERENTIAL   MAGNESIUM   TROPONIN I   LIPASE   TROPONIN I   URINE PREGNANCY TEST (POINT OF CARE)       EKG:   ED Course as of Apr 06 2353   Fri Apr 06, 2017   1725 Unchanged compared to prior  [FY]   1722 Sinus EKG normal EKG, normal intervals.  [FY]         ED Course and Medical Decision-making:  51 year old female patient presents with the above.  I think overall set of symptoms suggests recurrence of SVT, which was resolved by  her vagal maneuver.  On arrival here without tachycardia, sinus    She did endorse some chest pain during the palpitations, given her age and risk factors, overall doubt ACS, but will check troponin to rule out any elevations.  EKG is nonischemic.    Her GI symptoms has resolved, abdomen nontender doubt acute process    Check basic labs to rule out electrolyte derangement    ED course  -Potassium  minimally decreased at 3.3, repleted by mouth, patient feels on reevaluation, troponin negative, magnesium normal, no emesis or diarrhea in ED  -No recurrence of palpitations or tachycardia, safe for discharge outpatient follow-up    Additional verbal instructions given regarding diagnosis, follow up, and indications for return    Reasons to return to the ED were reviewed in detail. The patient agrees with this plan and disposition.    Condition on Discharge: Improved and Stable    Diagnosis/Diagnoses:  Palpitations  Non-intractable vomiting with nausea, unspecified vomiting type  History of supraventricular tachycardia      Nyoka Lint, MD  Attending Physician   [facility] Department of Emergency Medicine    This Emergency Department patient encounter note was created using voice-recognition software and in real time during the ED visit. Please excuse any typographical errors that have not been edited out.

## 2017-04-06 NOTE — Narrator Note (Signed)
#   63 Angiocath was removed from right anti cubital prior to discharge, cathlon intact.

## 2017-04-06 NOTE — Discharge Instructions (Signed)
Your were seen for palpitations, vomiting and loose stool and fatigue    Based on the history provided you likely had a run of SVT earlier that you terminated with your vagal maneuver    Your vital signs were stable here     You did not have any tachycardia or palpitations in emergency department your labs were checked, your potassium was slightly low and repleted,    You are stable for discharge, continue to follow precautions regarding your SVT    If you have persistent palpitations, chest pain, new abdominal pain, fever, persistent vomiting, fainting, please return to ED immediately for reevaluation    Continue your current medications and follow up with your PCP and cardiologist

## 2017-04-06 NOTE — ED Triage Note (Signed)
Patient presents here with nausea vomiting and diarrhea.  Patient's cardiologist tells her to put her finger in her throat to induce vomiting when she feels palpitations  This evening she was having palpitations and started to induce vomiting, but started to vomit and felt as if she was going to pass out.  Denise chest pain, but has felt very fatigued.

## 2017-04-09 LAB — EKG

## 2017-04-11 LAB — EKG

## 2017-05-18 ENCOUNTER — Encounter (HOSPITAL_BASED_OUTPATIENT_CLINIC_OR_DEPARTMENT_OTHER): Payer: Self-pay | Admitting: Ophthalmology

## 2017-05-18 ENCOUNTER — Ambulatory Visit (HOSPITAL_BASED_OUTPATIENT_CLINIC_OR_DEPARTMENT_OTHER): Payer: No Typology Code available for payment source | Admitting: Ophthalmology

## 2017-05-18 DIAGNOSIS — H52203 Unspecified astigmatism, bilateral: Secondary | ICD-10-CM

## 2017-05-18 DIAGNOSIS — H31012 Macula scars of posterior pole (postinflammatory) (post-traumatic), left eye: Secondary | ICD-10-CM

## 2017-05-18 DIAGNOSIS — H5203 Hypermetropia, bilateral: Secondary | ICD-10-CM

## 2017-05-18 DIAGNOSIS — H524 Presbyopia: Secondary | ICD-10-CM

## 2017-05-18 HISTORY — DX: Macula scars of posterior pole (postinflammatory) (post-traumatic), left eye: H31.012

## 2017-05-18 HISTORY — DX: Hypermetropia, bilateral: H52.03

## 2017-05-18 NOTE — Progress Notes (Signed)
Patient presents with:  Blurry VA: Here for comprehensive eye exam. She wears glasses full time. Some trouble seeing at distance.  Hyperopia with astigmatism and presbyopia. She's given a prescription for glasses.    Other: History of uveitis, left eye, approximately age 52 years old, in [place], treated with peribulbar injections, with chorioretinal scarring.  The chorioretinal scarring in the left eye is in the macula, temporal to the fovea. No active disease. No specific therapy.

## 2017-05-18 NOTE — Progress Notes (Signed)
52 years / F  -  Here for comprehensive eye exam.    Pt reports; decreased VA at distance CC x 2 months.      No other ocular c/o's.  No pain, No headache.  No flashes or floaters.     H/O: uveitis - OS 25 years ago in [place]     No history of eye trauma or eye surgery

## 2017-06-06 ENCOUNTER — Encounter (HOSPITAL_BASED_OUTPATIENT_CLINIC_OR_DEPARTMENT_OTHER): Payer: Self-pay | Admitting: Internal Medicine

## 2017-06-06 ENCOUNTER — Ambulatory Visit (HOSPITAL_BASED_OUTPATIENT_CLINIC_OR_DEPARTMENT_OTHER): Payer: Self-pay | Admitting: Internal Medicine

## 2017-06-06 ENCOUNTER — Ambulatory Visit (HOSPITAL_BASED_OUTPATIENT_CLINIC_OR_DEPARTMENT_OTHER): Payer: No Typology Code available for payment source | Admitting: Internal Medicine

## 2017-06-06 VITALS — BP 127/85 | HR 62 | Temp 97.9°F | Resp 18 | Ht 61.42 in | Wt 173.0 lb

## 2017-06-06 DIAGNOSIS — M7711 Lateral epicondylitis, right elbow: Secondary | ICD-10-CM | POA: Insufficient documentation

## 2017-06-06 DIAGNOSIS — K2100 Gastro-esophageal reflux disease with esophagitis, without bleeding: Secondary | ICD-10-CM

## 2017-06-06 DIAGNOSIS — G8929 Other chronic pain: Secondary | ICD-10-CM

## 2017-06-06 DIAGNOSIS — M79641 Pain in right hand: Secondary | ICD-10-CM

## 2017-06-06 DIAGNOSIS — S4991XA Unspecified injury of right shoulder and upper arm, initial encounter: Secondary | ICD-10-CM

## 2017-06-06 DIAGNOSIS — M1812 Unilateral primary osteoarthritis of first carpometacarpal joint, left hand: Secondary | ICD-10-CM | POA: Insufficient documentation

## 2017-06-06 DIAGNOSIS — E559 Vitamin D deficiency, unspecified: Secondary | ICD-10-CM

## 2017-06-06 DIAGNOSIS — M79642 Pain in left hand: Secondary | ICD-10-CM

## 2017-06-06 DIAGNOSIS — M25562 Pain in left knee: Secondary | ICD-10-CM

## 2017-06-06 DIAGNOSIS — M19042 Primary osteoarthritis, left hand: Secondary | ICD-10-CM

## 2017-06-06 DIAGNOSIS — M25561 Pain in right knee: Secondary | ICD-10-CM

## 2017-06-06 DIAGNOSIS — M65331 Trigger finger, right middle finger: Secondary | ICD-10-CM | POA: Insufficient documentation

## 2017-06-06 DIAGNOSIS — M170 Bilateral primary osteoarthritis of knee: Secondary | ICD-10-CM | POA: Insufficient documentation

## 2017-06-06 HISTORY — DX: Lateral epicondylitis, right elbow: M77.11

## 2017-06-06 HISTORY — DX: Unspecified injury of right shoulder and upper arm, initial encounter: S49.91XA

## 2017-06-06 LAB — XR HAND LEFT MINIMUM 3 VIEWS

## 2017-06-06 MED ORDER — DICLOFENAC SODIUM 1 % TD GEL: g | 0 refills | 0 days | Status: AC

## 2017-06-06 MED ORDER — DICLOFENAC SODIUM 1 % TD GEL
TRANSDERMAL | 0 refills | Status: AC
Start: 2017-06-06 — End: 2017-09-03

## 2017-06-06 NOTE — Addendum Note (Signed)
Addended by: Remi Haggard on: 06/06/2017 10:56 AM     Modules accepted: Orders

## 2017-06-06 NOTE — Progress Notes (Signed)
This is a 52 year old [place] speaking woman from [place] who was referred for "arthritis, + RF, nsaid allergy".    Lip swelling with an NSAID in the past.  Tolerates ibuprofen 400mg  twice a day.  Has esophagitis.    Describes triggering in the right hand, left elbow pain and right shoulder pain.  Denies swelling.    Past medical history:  Obesity  GERD  Microscopic hematuria  Macular chorioretinal scar on the left  Breast lump s/p bx  Low vitamin D  History of positive occult stool blood test  History of tobacco use  C-section  [facility] PMHX:  Chondromalacia patella  Bilateral knee osteoarthritis  Left-sided plantar fasciitis  Right-sided sciatica  Bilateral carpal tunnel syndrome  De Quervain's tenosynovitis on the right  Bilateral trochanteric bursitis requiring glucocorticoid injections  Right-sided lateral epicondylitis  Right-sided rotator cuff tendinopathy  Right third trigger finger requiring injection 2009 2010    Past medical history, allergies, medications, social history, family history, review of systems, labs and imaging were reviewed.    The patient filled out a new patient intake form to the best of their ability that includes PMHx, PSHx, family Hx, sexual Hx, gynecological Hx (for women), social history (includes habits, sleep, occupation, and hand dominance), health maintenance questions and a full review of systems.  This form has been scanned into the electronic medical record.  Please refer to it in conjunction with the new patient evaluation office visit note.    Fhx:  Mother- possible aunt with RA.  Distant cousins x2 with SLE    Labs:   CCP x2, ANA, rheumatoid factor x2  Negative    The patient was seen and examined with Dr. Margaretmary Dys.  Please see her detailed note in regards to history, physical examination, assessment and plan, which I agree with.    PE: left cmc tenderness and swelling over the left thenar eminence.  Trigger right 3rd finger.  Right straight leg  raise    Recs/plan:  Right 3rd trigger finger and right shoulder injections.  Please see Dr. Loralee Pacas note for details on the procedure.  A timeout was performed.  Obtained verbal informed consent.  No complications.  PT.  Elbow splint.  Thumb splint.  X-ray of the left hand.    Follow-up with Dr. Margaretmary Dys in [place] in about 3-4 months.    The patient's questions have been addressed and answered.     The patient verbalized an understanding and agreement with this plan.    The patient was ready to learn and no apparent learning barriers were identified.     This Rheumatology Division encounter note was created using voice recognition software.  There is potential for transcription errors and incorrect word usage based on limitations of the software.  Please excuse any typographical errors that have not been edited out.

## 2017-06-06 NOTE — Patient Instructions (Addendum)
Thumb splints may help to decrease pain by immobilizing the joint.  There are a variety of different splints, some of which are hard and others which are soft.   They can be small or longer to support the wrist, too.  You can obtain a splint from a local pharmacy, a medical supply store or an [employer] (medical) such as [employer].  I do not recommend any particular brand.            ELBOW SPLINT.  This is usually placed 2-3 centimeters below the elbow.  If it comes with a cushion, place the cushion at the spot where the pain is most severe.  These are examples that I found online.  You can obtain a splint at a local pharmacy, a medical supply store or an online store such as [employer].  I do not recommend any particular brand.

## 2017-06-06 NOTE — Progress Notes (Signed)
[facility]  Rheumatology New Patient Note    Date of Visit: 06/06/2017    Patient: Deborah [name] patient has documented    PCP: Malva Limes, MD    Primary Language: [place] [name]  Interpreter:  Ardine Bjork (phone); [employer]/gallery curator)    Reason for Referral:  Referred by Tanna Furry for, "arthritis, + RF, nsaid allergy"    History of present illness:  I had the pleasure of meeting Deborah [name] who is a 52 year old [place] speaking female bilateral knee osteoarthritis, nonalcoholic steatohepatitis, history of H. pylori gastritis in 2008 treatment, history of severe esophagitis seen on most recent EGD 2014, former smoker.    Patient was referred for evaluation of inflammatory arthritis given rheumatoid factor of 1-2 titer October 2014 and polyarthralgia.  Of note, the patient reports lip swelling with NSAID use 6 years ago.  She has not had a recurrence of this.    Patient complains of left lateral elbow pain of 5/10.  Pain is exacerbated with forced supination.  Denies any swelling of the elbow.  Denies any trauma to the elbow.  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  Of note, the patient is right-handed.  She has not attended physical therapy or had an injection.  She intermittently braces with a forearm brace.    Right shoulder pain for several months.  The shoulder "throbs and feels as though there is boiling water in the shoulder."  Right shoulder pain is exacerbated with with activities especially overhead activities and lifting overhead.  She does not have similar symptoms on the left side.  She has difficulty sleeping on the left side.  She has modified the way that she puts on her procedure.  Denies any previous injury.  Again has not had physical therapy also not had a corticosteroid injection.    Bilateral knee pain for several years.  Right knee pain is worse than left.  She reports knee pain which is worse with  ascending stairs.  Does not recall any swelling in the knee.  She also denies any history of nephrolithiasis.  In the past, she had a similar problem and was referred to physical therapy 2 years ago.  She intermittently practices the home exercise program which was given to her.  Today the pain is 4/10 with right being more symptomatic.  She denies any previous injury.    Right third digit of the hand is painful intermittently.  She reports locking of this digit.  This locking action occur several times a day.  She does not feel any nodule at the base of this digit.  Presently she does not have pain only when the fingers trigger.  Denies any swelling of her right hand.    Left CMC pain.  Left CMC hurts "all the time."  Is worse with activity.  Pain score today is 6/10.  Patient reports decreased grip strength in the left hand due to [facility] pain.    Generally speaking, the patient feels that in the last several years her pain complaints have progressed and worsened.  She does take Tylenol 1000 mg twice a day.  As well as ibuprofen 400 mg twice daily which does provide some intermittent relief of symptoms.  She is presently on omeprazole for esophagitis.  She does not feel as though ibuprofen is aggravating the symptoms.  She denies any abnormal bleeding or melena.    She denies any fevers, night sweats,  unintentional weight loss, chest pain, shortness of breath, rash, Raynaud's, abdominal pain, uveitis.    Past medical history:  Patient Active Problem List:     Lump or mass in breast     Gastroesophageal reflux disease without esophagitis     Other specified gastritis     Abdominal pain, epigastric     Angioneurotic edema not elsewhere classified     Plantar Fasciitis, L     Leg pain     Tobacco abuse, in remission     Triggering of Finger, R 3rd     Right subacromial bursitis/rotator cuff tendinopathy     Chondromalacia patellae     Lower back pain     De Quervain's Tenosynovitis, R     CTS (Carpal Tunnel Syndrome),  b/l     Family history of diabetes mellitus (DM)     Hypolipoproteinemia     Intermittent spinal claudication (HCC)     Vitamin D deficiency     Greater Trochanteric Bursitis, b/l     DDD (degenerative disc disease), lumbar     Obesity     Rapid Noctural Palpitations x 10 Seconds     Sweating     Microscopic hematuria     Reflux esophagitis     Lump of skin of lower extremity     Bilateral knee pain     Bilateral arm pain     Positive occult stool blood test     Chronic right-sided low back pain with right-sided sciatica     SVT To 240 BPM March 2010. As Of 2017, Episodes q 2 Weeks, Usually Subside One Minute, Occ 15 Minutes.  Commonly Relieved With Vagal Maneuvers.     BMI 33.0-33.9,adult     Rapid Palpitations Only Once/Month, Lasting Seconds, Abolished by Valsalva, Aug 2018.      Polyarthralgia     Macular chorioretinal scar of left eye     Hyperopia of both eyes with astigmatism and presbyopia    Past surgical history:  Past Surgical History:  No date: CESAREAN DELIVERY ONLY      Comment:  breech  No date: [place] BREAST BIOPSY SPECIMEN      Comment:  s/p fibroadenoma on the L,   No date: SEPTOPLASTY/SUBMUCOUS RESECJ W/WO CARTILAGE GRF    Allergies:  Review of Patient's Allergies indicates:   Naproxen                Swelling    Comment:Ed visit with angioedema   Ibuprofen               Other (See Comments)    Comment:Epigastric pain, throat swelling?             05/06/12 pt states uses sometimes without issue   Pollen extract-tree*    Runny Nose    Comment:HA, itchy watery eyes    Medications:    Current Outpatient Medications on File Prior to Visit:  Calcium 600-200 MG-UNIT per tablet Take 1 tablet by mouth 2 (two) times daily Disp: 60 tablet Rfl: 11   omeprazole (PRILOSEC) 20 MG capsule Take 1 capsule by mouth 2 (two) times daily before meals Disp: 60 capsule Rfl: 11   cholecalciferol (VITAMIN D3) 2000 UNIT tablet Take 1 tablet by mouth daily Disp: 90 tablet Rfl: 3   metoprolol (TOPROL-XL) 100 MG 24 hr tablet Take 1  tablet by mouth daily Disp: 30 tablet Rfl: 11   Calcium Carb-Cholecalciferol 600-500 MG-UNIT CAPS Take  by mouth. Disp:  Rfl:  No current facility-administered medications on file prior to visit.     Social history:  Social History     Socioeconomic History    Marital status: Divorced     Spouse name: Not on file    Number of children: 1    Years of education: Not on file    Highest education level: Not on file   Social Needs    Financial resource strain: Not on file    Food insecurity - worry: Not on file    Food insecurity - inability: Not on file    Transportation needs - medical: Not on file    Transportation needs - non-medical: Not on file   Occupational History    Occupation: housecleaning     Employer: [employer]   Tobacco Use    Smoking status: Former Smoker     Packs/day: 0.50     Years: 27.00     Pack years: 13.50     Types: Cigarettes     Last attempt to quit: 06/28/2002     Years since quitting: 14.9    Smokeless tobacco: Never Used   Substance and Sexual Activity    Alcohol use: No     Alcohol/week: 0.0 oz    Drug use: No    Sexual activity: Not Currently     Partners: Male     Comment: no STI hx; HIV neg 2002   Other Topics Concern    Military Service Not Asked    Blood Transfusions Not Asked    Caffeine Concern Not Asked    Occupational Exposure Not Asked    Hobby Hazards Not Asked    Sleep Concern Not Asked    Stress Concern Yes    Weight Concern Yes    Special Diet Yes    Back Care Not Asked    Exercise No    Bike Helmet Not Asked    Seat Belt Yes    Self-Exams Yes   Social History Narrative    [place], [place]. To [place] 2000.    Lives with her son and her parents. Divorced from husband.    Denies DV. No guns in home.        No regular exercise. Dental care in place.     Additional social history:    Sleep: Nonrestorative pattern, sleeps 6-7 hours.     Habits: Former smoker 0.5 ppd x 20 years.  She quit 16 years ago.  Denies any alcohol or drug use.  Does not  exercise.    Feels safe at home: Safe.    Performs ADLS without assistance: Independent.    Hand dominance: Right.     Occupation: Chartered certified accountant.    Marital Status: Divorced.     Lives with:  Parents and son.    Country of birth: [place].        Family History:    Family History   Problem Relation Age of Onset    Heart Father         arrhythmia    Hypertension Mother     Hypertension Father     Diabetes Mother     Diabetes Maternal Uncle     Diabetes Maternal Uncle     Heart Maternal Grandmother     Heart Maternal Uncle     Heart Maternal Uncle     Cancer - Other FamHxNeg     Cancer - Breast FamHxNeg     Cancer - Colon FamHxNeg     Cancer -  Lung FamHxNeg     Cancer - Ovarian FamHxNeg      Mother and sister with osteoarthritis.  Dad's cousin with SLE.  Aunt with question of rheumatoid arthritis.    Denies known family history of the following:  Systemic lupus erythematosus  Gout  Psoriasis  Inflammatory bowel disease  Thyroid disease  Hip fracture  Osteoporosis  Fibromyalgia syndrome  Muscle disease  Vasculitis  Scleroderma  Polymyalgia rheumatica  Ankylosing spondylitis    Gynecologic history:  Menarche 64, regular until menopause age 36.  History of one pregnancy without miscarriage.     Sexual HX:  Currently sexually inactive.  Denies STI.     Health Maintenance:  Last eye exam- 04/2017  Last dental exam- 5 months ago.   Last colonoscopy- NEVER  Last bone density- NEVER  PPD or QuantiFERON TB Gold- negative, but unsure of date  AWQ Questionnaire due on 01/31/2018  HEALTH CARE PROXY due on 02/25/2018  FECAL OCCULT BLOOD AGE 79+ due on 03/21/2018  LIPID SCREENING due on 10/09/2018  MAMMOGRAPHY due on 02/14/2019  PAP SMEAR due on 06/30/2019  HPV SCREENING due on 06/30/2019  TDAP/TD VACCINE(2 - Td) due on 03/08/2026  HIV SCREENING Completed  HEP C SCREEN (DOB [birth year]) Completed  INFLUENZA VACCINE Completed  PHYSICAL EXAM Completed; No procedure found.    ROS:     Review of Systems: Constitutional, Eyes,  ENT/Mouth, Cardiovascular, Respiratory, GI, GU, Neuro, Psych, Heme/Lymph, Skin, Musculoskeletal, and Endocrine systems were reviewed and are NEGATIVE, except for what is documented in the note.    The patient filled out a new patient intake form to the best of their ability that includes PMHx, PSHx, family Hx, sexual Hx, gynecological Hx (for women), social history (includes habits, sleep, occupation, and hand dominance), health maintenance questions and a full review of systems.  This form has been scanned into the electronic medical record.  Please refer to it in conjunction with the new patient evaluation office visit note.    PE:   06/06/17  0926   BP: 127/85   Pulse: 62   Resp: 18   Temp: 97.9 F (36.6 C)   SpO2: 100%   Weight: 78.5 kg (173 lb)   Height: 5' 1.42" (1.56 m)     Body mass index is 32.25 kg/m.  GENERAL: NAD. Normocephalic/Atraumatic. Alert and oriented to person, place, and time. Good eye contact. Full affect. Follows commands appropriately.  Well-groomed.    HEENT: Pupils were equal, round and reactive. Extraocular motions are intact. Mucous membranes are moist.  Dentition is normal.  Oral aperture is normal. There is no scalp rash. No malar rash. Anicteric sclerae. Non-injected conjunctivae. No nystagmus. Oropharynx clear. Tongue is midline. Symmetric palate raise.  No ulcerations. No exudate. No erythema.  Tympanic membranes are clear.  No parotid enlargement.   NECK: Supple. No appreciable cervical lymphadenopathy.  LUNGS: Clear to auscultation without wheezes, rhonchi, rales, or crackles.  CV: S1/S2 normal, without murmurs, gallops, or rubs.  ABDOMEN:  Bowel sounds in all four quadrants, soft, nontender, nondistended. No appreciable organomegaly.  EXTREMITIES: Warm and well-perfused without any sign of cyanosis, clubbing, or edema.  SKIN: No rashes, nodules, bruising, hyperpigmentation, hypopigmentation, telangiectasias, periungal erythema, splinter hemorrhages, sclerodermatous changes,  icterus, petechiae, palpable purpura, tethering, striae, keloid, nail abnormalities or tophi.  MUSCULOSKELETAL:   There is tenderness to palpation and swelling of left CMC.  Positive finkelstein on left.  There is tenderness to palpation of the palmar aspect of the right third MCP without  nodularity or triggering.  No swelling in the wrists.   No swelling in elbows. No swelling in the shoulders.  No restriction in range of motion in fingers, wrists, elbows, or shoulders.   No tender joints in the fingers, wrists.  There is tenderness of right lateral epicondyle  Positive right empty can sign.   No pain over the sternoclavicular joint, AC joint or TMJs.   No restriction in ROM of the cervical spine.  No focal back tenderness.   Positive straight leg raise on right.  Negative FABER. Negative Schober.   No restriction in range of motion in hips, knees, or ankles.   No swelling in the knees or ankles.   Bilateral valgus knee deformity with crepitation  No Achille's swelling or tenderness.  There is no tenderness on palpation of the MTPs.    No dactylitis or splaying of the toes.  NEURO: Cranial nerves II-XII intact. Patellar reflexes, Achilles reflexes and brachioradialis reflexes are 2/2 and symmetric. Narrow-based gait.  Power is 5/5 in all four limbs and without weakness. No difficulty getting up from seated position without using hands or out of a supine position.  No assistive ambulatory devices.    LABORATORY DATA:  Blood Counts  Lab Results   Component Value Date    WBC 10.4 04/06/2017    HGB 13.6 04/06/2017    HCT 40.8 04/06/2017    MCV 87.6 04/06/2017    PLTA 208 04/06/2017      Chemistries  Lab Results   Component Value Date    CA 9.5 04/06/2017    NA 143 04/06/2017    K 3.3 (L) 04/06/2017    CO2 26 04/06/2017    CL 102 04/06/2017    BUN 19 (H) 04/06/2017    CREAT 0.9 04/06/2017     CrCl cannot be calculated (Patient's most recent lab result is older than the maximum 7 days allowed.).    Liver Function  Tests  Lab Results   Component Value Date    ALT 43 04/06/2017    AST 28 04/06/2017    ALBUMIN 4.2 04/06/2017    ALKPHOS 117 04/06/2017     Acute Phase Reactants  Lab Results   Component Value Date    ESR 12 01/31/2017    CRP 0.8 01/31/2017     Rheumatologic Labs  Results for Deborah [name], Deborah [name] (MRN [id number]) as of 06/06/2017 09:47   Ref. Range 10/19/2000 09:35 11/13/2006 19:42 10/23/2007 12:50 01/31/2017 16:57   ANTINUCLEAR ANTIBODY Latest Ref Range: NEGATIVE  NEGATIVE NEGATIVE     CYCLIC CITRULLIN PEPTIDE IgG Latest Ref Range: 0.0 - 2.9 U/mL    < 0.5   CYCLIC CITRULLINATED PEPTIDE IGG ANTIBODY Latest Ref Range: 0 - 5 U/mL   1    RHEUMATOID FACTOR Latest Ref Range: NEGATIVE    12.4 POSITIVE (A)   RHEUMATOID FACTOR TITER Latest Ref Range: <1:1     1:2       Lab Results   Component Value Date    RF POSITIVE (A) 01/31/2017   Rheumatoid Factor (01/31/2017): 1:2    Lab Results   Component Value Date    CCPAB < 0.5 01/31/2017     Other Labs  Lab Results   Component Value Date    HGBA1C 5.2 06/30/2008     Lab Results   Component Value Date    TSHSC 1.380 02/07/2016     VITAMIN D,25 HYDROXY (ng/mL)   Date Value   01/31/2017 23 (L)  Lab Results   Component Value Date    CKTOTAL 95 06/16/2008     HEPATITIS B SURFACE ANTIGEN (no units)   Date Value   04/04/2006 NON-REACTIVE     HEPATITIS B SURFACE ANTIBODY (no units)   Date Value   04/04/2006 NEGATIVE     HEPATITIS C ANTIBODY (no units)   Date Value   04/04/2006 NEGATIVE     Urine Studies  Lab Results   Component Value Date    UACOL YELLOW 04/06/2017    UACOL YELLOW 10/27/2013    UACLA CLEAR 04/06/2017    UACLA CLEAR 10/27/2013    UAGLU NEGATIVE 04/06/2017    UAGLU NEGATIVE 10/27/2013    UABIL SMALL (A) 04/06/2017    UABIL NEGATIVE 10/27/2013    UAKET TRACE (A) 04/06/2017    UAKET NEGATIVE 10/27/2013    SPEGRAVURINE >=1.030 04/06/2017    SPEGRAVURINE 1.010 10/27/2013    UAOCC TRACE-INTACT (A) 04/06/2017    UAOCC NEGATIVE 10/27/2013    UAPH 5.5 04/06/2017    UAPH 6.0  10/27/2013    UAPRO NEGATIVE 04/06/2017    UAPRO NEGATIVE 10/27/2013    UANIT NEGATIVE 04/06/2017    UANIT NEGATIVE 10/27/2013    LEUKOCYTES NEGATIVE 04/06/2017    LEUKOCYTES NEGATIVE 10/27/2013       Imaging reviewed personally by me today:  Chest Xray (11/2016):  No pneumothorax or pneumonia.     Right Ankle Xray (01/2015):  Lateral ankle soft tissue swelling. No fracture.     Bilateral Knee Xray (03/2013):  1. Tiny marginal osteophytes along the right medial tibiofemoral   compartment. 2. Normal alignment and preservation of the cartilage spaces.     Right Hand Xray (02/2008):  Unremarkable right hand radiographs.    Lumbar Xray (10/2007):  Normal alignment of the vertebral bodies. Vertebral body heights are well  maintained. An peritumoral disc spaces are normal. No acute fracture,  subluxation or dislocation. No evidence of facet disease. Evidence of   coarse interarticularis defect. SI joints are normal.    Assessment: Deborah [name] is a 52 year old [place] speaking female with severe esophagitis and polyarthralgia which is not due to unifying diagnosis such as inflammatory arthritis.  The patient's polyarthralgia may be explained by the following:  1.  Bilateral knee osteoarthritis.  2.  Right shoulder pain likely due to osteoarthritis versus rotator cuff tendinitis versus bursitis.  3.  Right lateral epicondylitis.  4.  Hand osteoarthritis, CMC osteoarthritis.  5.  Right finger stenosing tenosynovitis.    Plan/Recommendations:  1.  Bilateral knee osteoarthritis.  Patient was referred to physical therapy for stretching strengthening exercises.  We have advised the patient to stop oral NSAIDs given history of esophagitis.  Advised her to apply Voltaren gel to her knees to 3 times a day.  2.  Right shoulder pain likely due to osteoarthritis versus rotator cuff tendinitis versus bursitis.  Patient was offered subacromial corticosteroid injection and referred to physical therapy.   Please see procedure note below.  Procedure note     Date: 06/06/2017    Deborah [name] is a 52 year old female who is here for the following procedure.     Procedure: Right subacromial bursa injection.     Indication: Pain.     I explained the risks (bleeding, infection, pain, steroid atrophy, tendon rupture, hypopigmentation of the skin) and benefits to the patient and Deborah [name] understood and gave verbal consent to proceed. After time-out was performed, the area over  the right subacromial bursa was prepped in a sterile manner using chlorhexidene swab. Topical ethyl chloride was used as anesthetic.    Using a 1 1/2 inch 25 gauge needle, the right subacromial bursa was injected with 2 cc of 1% lidocaine and 1 cc (80 mg) of methylprednisolone. There were no complications. The patient has been told to ice and rest the right shoulder for the next few days and to call if there is increased erythema, swelling, pain, or fevers.    3.  Right lateral epicondylitis.  The patient was advised to purchase a forearm brace.  She may apply Voltaren gel  twice a day to the lateral right elbow.  We will hold on injection for now.  4.  Hand osteoarthritis, Left CMC osteoarthritis with swelling of thenar eminence.  We will hold on injection for now.  Patient advised to purchase a spica splint.  If the over-the-counter splint is not effective would consider referral to OT for custom spica splint.  Patient may apply Voltaren gel to the left CMC 3 times a day.  Left hand x-ray ordered.  5.  Right finger stenosing tenosynovitis.  Patient was offered corticosteroid injection.  We have advised her that if she requires more than 2 injections for this problem she would likely need to be seen by hand surgeon for evaluation of  trigger finger release.  Please see procedure note below:  Procedure note     Date: 06/06/2017    Deborah [name] is a 52 year old female who is here for the following procedure.      Procedure: Right third corticosteroid injection for stenosing tenosynovitis of the hand.     Indication: Pain and triggering.     I explained the risks (bleeding, infection, pain, steroid atrophy, tendon rupture, hypopigmentation of the skin) and benefits to the patient and Deborah [name] understood and gave verbal and consent to proceed. After time-out was performed, the area over the right third palmar MCP was prepped in a sterile manner using chlorhexidene swab. Topical ethyl chloride was used as anesthetic.     Using a 5/8 inch 25 gauge needle, the right third palmar MCP was injected with 0.1 cc of 1% lidocaine and 1 cc (20 mg) of methylprednisolone. There were no complications. The patient has been told to ice and rest the right hand for the next few days and to call if there is increased erythema, swelling, pain, or fevers.    6.  Patient has a documented allergy to oral NSAIDs.  Although she is presently tolerating oral NSAIDs.  We recommend that she discontinue oral NSAIDs given severe esophagitis.  For this reason, diclofenac gel was prescribed.  We expect that she should not experience anaphylaxis with topical diclofenac.  Patient advised to go to the ER if she develops any swelling or difficulty breathing.    HEALTH MAINTENANCE:  Influenza vaccination is uptodate.    EDUCATION:  As far as long-term NSAID use, I explained the risks of this, including potential cardiac, GI, and renal toxicities. Periodic monitoring with cbc and renal function are appropriate.      I discussed the likely diagnosis(es),  prognosis(es), and the various treatment options in detail with the patient, including observation.     Risks and benefits of the treatment plan(s) were discussed.     The patient's questions have been addressed and answered.     We discussed importance of medication compliance.     The patient was ready  to learn and no apparent learning barriers were identified.     Follow-up in 3-4 months with Dr.  Margaretmary Dys, earlier if needed.    Deborah [name] verbally expressed an understanding and agreement with the plan as outlined above.    Thank you for your kind referral.  I appreciate the opportunity to participate in [facility] care.  Please contact me with any questions that you may have.    This dictation was made using voice recognition software.  There is potential for transcription errors and incorrect word usage based on limitations of the software.      Deborah Collison Adolf-Ubokudom, MD    This patient was seen and examined with Vinnie Level L. [name], MD, MPH, [facility]    Imaging reviewed.  Left hand x-ray consistent with osteoarthritis.  Letter letter sent to patient.

## 2017-06-29 ENCOUNTER — Encounter (HOSPITAL_BASED_OUTPATIENT_CLINIC_OR_DEPARTMENT_OTHER): Payer: Self-pay | Admitting: Physician Assistant

## 2017-06-29 ENCOUNTER — Ambulatory Visit (HOSPITAL_BASED_OUTPATIENT_CLINIC_OR_DEPARTMENT_OTHER): Payer: No Typology Code available for payment source | Admitting: Physician Assistant

## 2017-06-29 VITALS — BP 104/76 | HR 69 | Temp 98.1°F | Resp 16 | Ht 61.0 in | Wt 170.0 lb

## 2017-06-29 DIAGNOSIS — H8301 Labyrinthitis, right ear: Secondary | ICD-10-CM

## 2017-06-29 MED ORDER — MECLIZINE HCL 25 MG PO TABS: 25 mg | tablet | Freq: Three times a day (TID) | ORAL | 0 refills | 0 days | Status: AC | PRN

## 2017-06-29 MED ORDER — MECLIZINE HCL 25 MG PO TABS
25.00 mg | ORAL_TABLET | Freq: Three times a day (TID) | ORAL | 0 refills | Status: AC | PRN
Start: 2017-06-29 — End: 2017-07-29

## 2017-06-29 NOTE — Progress Notes (Signed)
Pt feels safe at home.  Ivar Bury, 06/29/2017, 1:09 PM

## 2017-06-29 NOTE — Patient Instructions (Addendum)
Patient Education   How to Perform the Epley Maneuver  The Epley maneuver is an exercise that relieves symptoms of vertigo. Vertigo is the feeling that you or your surroundings are moving when they are not. When you feel vertigo, you may feel like the room is spinning and have trouble walking. Dizziness is a little different than vertigo. When you are dizzy, you may feel unsteady or light-headed.  You can do this maneuver at home whenever you have symptoms of vertigo. You can do it up to 3 times a day until your symptoms go away.  Even though the Epley maneuver may relieve your vertigo for a few weeks, it is possible that your symptoms will return. This maneuver relieves vertigo, but it does not relieve dizziness.  What are the risks?  If it is done correctly, the Epley maneuver is considered safe. Sometimes it can lead to dizziness or nausea that goes away after a short time. If you develop other symptoms, such as changes in vision, weakness, or numbness, stop doing the maneuver and call your health care provider.  How to perform the Epley maneuver  1. Sit on the edge of a bed or table with your back straight and your legs extended or hanging over the edge of the bed or table.  2. Turn your head halfway toward the affected ear or side.  3. Lie backward quickly with your head turned until you are lying flat on your back. You may want to position a pillow under your shoulders.  4. Hold this position for 30 seconds. You may experience an attack of vertigo. This is normal.  5. Turn your head to the opposite direction until your unaffected ear is facing the floor.  6. Hold this position for 30 seconds. You may experience an attack of vertigo. This is normal. Hold this position until the vertigo stops.  7. Turn your whole body to the same side as your head. Hold for another 30 seconds.  8. Sit back up.  You can repeat this exercise up to 3 times a day.  Follow these instructions at home:   After doing the Epley  maneuver, you can return to your normal activities.   Ask your health care provider if there is anything you should do at home to prevent vertigo. He or she may recommend that you:  ? Keep your head raised (elevated) with two or more pillows while you sleep.  ? Do not sleep on the side of your affected ear.  ? Get up slowly from bed.  ? Avoid sudden movements during the day.  ? Avoid extreme head movement, like looking up or bending over.  Contact a health care provider if:   Your vertigo gets worse.   You have other symptoms, including:  ? Nausea.  ? Vomiting.  ? Headache.  Get help right away if:   You have vision changes.   You have a severe or worsening headache or neck pain.   You cannot stop vomiting.   You have new numbness or weakness in any part of your body.  Summary   Vertigo is the feeling that you or your surroundings are moving when they are not.   The Epley maneuver is an exercise that relieves symptoms of vertigo.   If the Epley maneuver is done correctly, it is considered safe. You can do it up to 3 times a day.  This information is not intended to replace advice given to you by your health  care provider. Make sure you discuss any questions you have with your health care provider.  Document Released: 04/01/2013 Document Revised: 02/15/2016 Document Reviewed: 02/15/2016  Elsevier Interactive Patient Education  2017 [place].         Patient Education   Labyrinthitis  Labyrinthitis is an infection of the inner ear. Your inner ear is a fluid-filled system of tubes and canals (labyrinth). Nerve cells in your inner ear send signals for hearing and balance to your brain. When tiny germs (microorganisms) get inside the labyrinth, they harm the cells that send messages to the brain. Labyrinthitis can cause changes in hearing and balance.  Most cases of labyrinthitis come on suddenly and they clear up within weeks. If the infection damages parts of the labyrinth, some symptoms may remain  (chronic labyrinthitis).  What are the causes?  Viruses are the most common cause of labyrinthitis. Viruses that spread into the labyrinth are the same viruses that cause other diseases, such as:   Mononucleosis.   Measles.   Flu.   Herpes.    Bacteria can also cause labyrinthitis when they spread into the labyrinth from an infection in the brain or the middle ear. Bacteria can cause:   Serous labyrinthitis. This type of labyrinthitis develops when bacteria produce a poison (toxin) that gets inside the labyrinth.   Suppurative labyrinthitis. This type of labyrinthitis develops when bacteria get inside the labyrinth.    What increases the risk?  You may be at greater risk for labyrinthitis if you:   Drink a lot of alcohol.   Smoke.   Take certain drugs.   Are not well rested (fatigued).   Are under a lot of stress.   Have allergies.   Recently had a nose or throat infection (upper respiratory infection) or an ear infection.    What are the signs or symptoms?  Symptoms of labyrinthitis usually start suddenly. The symptoms can be mild or strong and may include:   Dizziness.   Hearing loss.   A feeling that you are moving when you are not (vertigo).   Ringing in the ear (tinnitus).   Nausea and vomiting.   Trouble focusing your eyes.    Symptoms of chronic labyrinthitis may include:   Fatigue.   Confusion.   Hearing loss.   Tinnitus.   Poor balance.   Vertigo after sudden head movements.    How is this diagnosed?  Your health care provider may suspect labyrinthitis if you suddenly get dizzy and lose hearing, especially if you had a recent upper respiratory infection. Your health care provider will perform a physical exam to:   Check your ears for infection.   Test your balance.   Check your eye movement.    Your health care provider may do several tests to rule out other causes of your symptoms and to help make a diagnosis of labyrinthitis. These may include:   Imaging studies, such as a CT  scan or an MRI, to look for other causes of your symptoms.   Hearing tests.   Electronystagmography (ENG) to check your balance.    How is this treated?  Treatment of labyrinthitis depends on the cause. If your labyrinthitis is caused by a virus, it may get better without treatment. If your labyrinthitis is caused by bacteria, you may need medicine to fight the infection (antibiotic medicine). You may also have treatment to relieve labyrinthitis symptoms. Treatments may include:   Medicines to:  ? Stop dizziness.  ? Relieve nausea.  ?  Treat the inflamed area.  ? Speed up your recovery.   Bed rest until dizziness goes away.   Fluids given through an IV tube. You may need this treatment if you have too little fluid in your body (dehydrated) from repeated nausea and vomiting.    Follow these instructions at home:   Take medicines only as directed by your health care provider.   If you were prescribed an antibiotic medicine, finish all of it even if you start to feel better.   Rest as much as possible.   Avoid loud noises and bright lights.   Do not make sudden movements until any dizziness goes away.   Do not drive until your health care provider says that you can.   Drink enough fluid to keep your urine clear or pale yellow.   Work with a physical therapist if you still feel dizzy after several weeks. A therapist can teach you exercises to help you adjust to feeling dizzy (vestibular rehabilitation exercises).   Keep all follow-up visits as directed by your health care provider. This is important.  Contact a health care provider if:   Your symptoms are not relieved by medicines.   Your symptoms last longer than two weeks.   You have a fever.  Get help right away if:   You become very dizzy.   You have nausea or vomiting that does not go away.   Your hearing gets much worse very quickly.  This information is not intended to replace advice given to you by your health care provider. Make sure you  discuss any questions you have with your health care provider.  Document Released: 04/17/2014 Document Revised: 09/02/2015 Document Reviewed: 11/26/2013  Elsevier Interactive Patient Education  2017 [employer].

## 2017-06-29 NOTE — Progress Notes (Addendum)
CC: Dizziness x 5 days    HPI: Patient is a 52 yo female presenting with 5 days of continual dizziness (vertigo) that has not improved. She states the dizziness is worst with movement and moving from laying to standing. She further supports loss of balance, sore throat, nausea and left ear pain that has been present since the onset of her dizziness symptoms. States that her symptoms are worst with rightward motions. She denies fevers, chills, vomiting, diarrhea, and abdominal pain. She states she has been taking meclizine which she was previously prescribed but the medication has only modestly improved her nausea and has not improved her dizziness symptoms. Pt states that she has not sustained any falls and does not feel as though she will fall. No other issues/concerns.     Past Medical History:   Diagnosis Date    Esophageal reflux     Hyperopia of both eyes with astigmatism and presbyopia 05/18/2017    Irregular menstrual cycle     Macular chorioretinal scar of left eye 05/18/2017    Temporal to fovea, left eye    Pregnant state, incidental     c/s breech    SVT (supraventricular tachycardia) ([facility]) 3/10    [facility]     Past Surgical History:  No date: CESAREAN DELIVERY ONLY      Comment:  breech  No date: [place] BREAST BIOPSY SPECIMEN      Comment:  s/p fibroadenoma on the L,   No date: SEPTOPLASTY/SUBMUCOUS RESECJ W/WO CARTILAGE GRF    Review of Patient's Allergies indicates:   Naproxen                Swelling    Comment:Ed visit with angioedema   Ibuprofen               Other (See Comments)    Comment:Epigastric pain, throat swelling?             05/06/12 pt states uses sometimes without issue   Pollen extract-tree*    Runny Nose    Comment:HA, itchy watery eyes      Current Outpatient Medications:     meclizine (ANTIVERT) 25 MG TABS, Take 1 tablet by mouth every 8 (eight) hours as needed, Disp: 90 tablet, Rfl: 0    diclofenac (VOLTAREN) 1 % GEL Gel, Apply 1 g to both knees 3 times a day.  Do Not Exceed 32 g in 24  Hours, Disp: 100 g, Rfl: 0    Calcium 600-200 MG-UNIT per tablet, Take 1 tablet by mouth 2 (two) times daily, Disp: 60 tablet, Rfl: 11    omeprazole (PRILOSEC) 20 MG capsule, Take 1 capsule by mouth 2 (two) times daily before meals, Disp: 60 capsule, Rfl: 11    cholecalciferol (VITAMIN D3) 2000 UNIT tablet, Take 1 tablet by mouth daily, Disp: 90 tablet, Rfl: 3    metoprolol (TOPROL-XL) 100 MG 24 hr tablet, Take 1 tablet by mouth daily, Disp: 30 tablet, Rfl: 11    ROS:  Constitutional: Supports nausea, headache. Denies fever, chills, malaise, vomiting.  HEENT: Supports left ear pain, sinus congestion.   Cardiac: Denies palpitations, tachycardia, chest pain.  Pulmonary: Denies congestion, sob.  GI: Denies abdominal pain, diarrhea, changes in BM, melena.   GU: Denies dysuria.   Endo: Denies hot/cold intolerance, polydipsia, polyphagia, polyuria.   Heme: Denies night sweats.  Neuro: Supports dizziness, lightheadedness. Denies vision changes.   Psych: Denies depression, anxiety.     Physical Exam:     06/29/17  1310   BP: 104/76   Pulse: 69   Resp: 16   Temp: 98.1 F (36.7 C)   TempSrc: Oral   SpO2: 98%   Weight: 77.1 kg (170 lb)   Height: 5\' 1"  (1.549 m)     General: Alert and oriented x 3. NAD. Pleasant, conversational.  HEENT: Atraumatic, normocephalic. EOMI. PERRL. Conjunctiva clear bilaterally. TM bulging, opaque, and with bubbling along the inferior ring, R>L. Ear canal without drainage, erythema. Oral mucosa without erythema, lesions. No cervical, submental, submandibular, anterior lymphadenopathy present. Nares patent. Dix-Hallpike (-)  Cardiac: RRR. No MRG.   Pulmonary: Clear to pulmonary auscultation bilaterally without WRR.   GI: Bowel sounds normoactive. NTTP of all abdominal quadrants without rebound/guarding.  Skin: Temperature, turgor, color, and texture normal in exposed skin areas.   Psych: Patient appears stated age, well groomed. Good insight and judgement.     A&P:  1. Labyrinthitis of right  ear  Patient has been informed that the cause of her vertigo is likely a viral etiology leading to increased congestion and blockage of the sinuses of her ears. This is supported by her sore throat and left ear pain as well as the illness time course. Labyrinthitis vs BPPV due to the rapid onset of symptoms, lack of relief with any position, negative dix-hallpike, pre-dizziness viral syndrome symptoms and worse dizziness with rightward motions. BPPV remains on the differential. Patient has been given prescription for meclizine for the mgmt of her dizziness and nausea symptoms and has been advised that OTC sudafed will work well for the relief of her sinus congestion. Start Epley maneuvers at home, pt ed provided and reviewed. She has been advised to f/u with the clinic if her symptoms worsen of do not resolve. She has received patient education on labyrinthitis.   -meclizine (ANTIVERT) 25 MG tabs          Grace Isaac PA-S  06/29/17             I personally performed a physical exam and the medical decision-making portion of this visit and was physically present to confirm and verify the documentation of history, physical exam, and medical decision making  Documentation provided by the medical student.  Any differences are added above in bold print.    Tanna Furry PA-C

## 2017-07-02 ENCOUNTER — Encounter (HOSPITAL_BASED_OUTPATIENT_CLINIC_OR_DEPARTMENT_OTHER): Payer: Self-pay | Admitting: Physician Assistant

## 2017-09-10 ENCOUNTER — Ambulatory Visit (HOSPITAL_BASED_OUTPATIENT_CLINIC_OR_DEPARTMENT_OTHER): Payer: No Typology Code available for payment source | Admitting: Internal Medicine

## 2017-09-10 ENCOUNTER — Encounter (HOSPITAL_BASED_OUTPATIENT_CLINIC_OR_DEPARTMENT_OTHER): Payer: Self-pay | Admitting: Internal Medicine

## 2017-09-10 VITALS — BP 110/75 | HR 93 | Temp 98.4°F | Resp 18 | Ht 61.0 in | Wt 183.0 lb

## 2017-09-10 DIAGNOSIS — M7711 Lateral epicondylitis, right elbow: Secondary | ICD-10-CM

## 2017-09-10 DIAGNOSIS — K219 Gastro-esophageal reflux disease without esophagitis: Secondary | ICD-10-CM

## 2017-09-10 DIAGNOSIS — G5601 Carpal tunnel syndrome, right upper limb: Secondary | ICD-10-CM

## 2017-09-10 DIAGNOSIS — M65331 Trigger finger, right middle finger: Secondary | ICD-10-CM

## 2017-09-10 DIAGNOSIS — M2550 Pain in unspecified joint: Secondary | ICD-10-CM

## 2017-09-10 MED ORDER — DICLOFENAC SODIUM 1 % TD GEL
TRANSDERMAL | 3 refills | Status: DC
Start: 2017-09-10 — End: 2018-09-17

## 2017-09-10 MED ORDER — DICLOFENAC SODIUM 1 % TD GEL: g | 3 refills | 0 days | Status: AC

## 2017-09-10 NOTE — Patient Instructions (Signed)
ELBOW SPLINT.  This is usually placed 2-3 centimeters below the elbow.  If it comes with a cushion, place the cushion at the spot where the pain is most severe.  These are examples that I found online.  You can obtain a splint at a local pharmacy, a medical supply store or an online store such as [employer].  I do not recommend any particular brand.

## 2017-09-10 NOTE — Progress Notes (Signed)
Counterforce brace applied to right elbow by Anson Crofts LPN under the direction of Dr.Adolf.    CSM positive after DME applied.    Instructions given to patient on proper care, removal and application of DME.     Call [facility] with any questions or concerns.

## 2017-09-10 NOTE — Progress Notes (Signed)
[facility]  Rheumatology Follow-Up Patient Note    Date of Visit: 09/10/2017    Patient: Deborah [name]    PCP: Helane Gunther, APRN    Primary Language: [place] [name]  Interpreter: Cleophus Molt proficent.      Reason for Visit:  Osteoarthritis of hands, trigger finger, and elbow pain.  Last seen February 2019.      History of present illness:  Deborah [name] who is a 52 year old [place] and [place]- speaking female with bilateral knee osteoarthritis, nonalcoholic steatohepatitis, history of H. pylori gastritis in 2008 treatment, history of severe esophagitis seen on most recent EGD 2014, former smoker.    She was seen during initial consultation June 06, 2017 for evaluation of inflammatory arthritis in the setting of polyarthralgia and low titer rheumatoid factor of 1-2.  At the end of that encounter, polyarthralgia was deemed to be due to mechanical causes including bilateral knee osteoarthritis, right shoulder pain due to osteoarthritis versus rotator cuff tendinopathy; right lateral epicondylitis, CMC osteoarthritis and right hand third digit stenosing tenosynovitis.  During this initial visit, she received corticosteroid injection to the right subacromial bursa as well as the right third palmar aspect of the MCP of the hand for stenosing tenosynovitis.  Given her history of esophagitis and severe allergy to NSAIDs we recommended topical Voltaren gel.    She presents today in follow-up.    Today she is without shoulder pain reports resolution of right shoulder pain after subacromial corticosteroid injection.    She also reports improvement in triggering of the right middle finger after corticosteroid injection at the last visit.  She reports that it is locking me approximately 1 night a week.    Her primary complaint today is decreased grip strength in the right hand in addition to right lateral elbow pain.      She notes that she is  right-handed works as a Secretary/administrator and feels as though she is dropping objects more frequently.  Does report some numbness which awakens her from sleep at night but is unable to localize which fingers.  She is bracing intermittently but not every night.    She also notes right lateral elbow pain which is worse with forced supination.  She has applied Voltaren gel to the right elbow without any modification symptoms.  She is currently not using a counterforce brace.        Rheumatologic Problem List:  1.  Bilateral knee osteoarthritis.  2.  Right shoulder pain likely due to osteoarthritis versus rotator cuff tendinitis versus bursitis.  3.  Right lateral epicondylitis.  4.  Hand osteoarthritis, CMC osteoarthritis.  5.  Right third digit of the hand stenosing tenosynovitis.  6.  Low titer rheumatoid factor 1:2.    Past medical history:  Patient Active Problem List:     Lump or mass in breast     Gastroesophageal reflux disease without esophagitis     Other specified gastritis     Abdominal pain, epigastric     Angioneurotic edema not elsewhere classified     Plantar Fasciitis, L     Leg pain     Tobacco abuse, in remission     Triggering of Finger, R 3rd     Right subacromial bursitis/rotator cuff tendinopathy     Chondromalacia patellae     Lower back pain     De Quervain's Tenosynovitis, R     CTS (Carpal Tunnel Syndrome), b/l     Family  history of diabetes mellitus (DM)     Hypolipoproteinemia     Intermittent spinal claudication (HCC)     Vitamin D deficiency     Greater Trochanteric Bursitis, b/l     DDD (degenerative disc disease), lumbar     Obesity     Rapid Noctural Palpitations x 10 Seconds     Sweating     Microscopic hematuria     Reflux esophagitis     Lump of skin of lower extremity     Bilateral knee pain     Bilateral arm pain     Positive occult stool blood test     Chronic right-sided low back pain with right-sided sciatica     SVT To 240 BPM March 2010. As Of 2017, Episodes q 2 Weeks, Usually  Subside One Minute, Occ 15 Minutes.  Commonly Relieved With Vagal Maneuvers.     BMI 33.0-33.9,adult     Rapid Palpitations Only Once/Month, Lasting Seconds, Abolished by Valsalva, Aug 2018.      Polyarthralgia     Macular chorioretinal scar of left eye     Hyperopia of both eyes with astigmatism and presbyopia     Osteoarthritis of both knees     Lateral epicondylitis of right elbow     Trigger middle finger of right hand     Osteoarthritis of carpometacarpal (CMC) joint of left thumb     Right shoulder injury    Past surgical history:  Past Surgical History:  No date: CESAREAN DELIVERY ONLY      Comment:  breech  No date: [place] BREAST BIOPSY SPECIMEN      Comment:  s/p fibroadenoma on the L,   No date: SEPTOPLASTY/SUBMUCOUS RESECJ W/WO CARTILAGE GRF    Allergies:  Review of Patient's Allergies indicates:   Naproxen                Swelling    Comment:Ed visit with angioedema   Ibuprofen               Other (See Comments)    Comment:Epigastric pain, throat swelling?             05/06/12 pt states uses sometimes without issue   Pollen extract-tree*    Runny Nose    Comment:HA, itchy watery eyes    Medications:    Current Outpatient Medications on File Prior to Visit:  Calcium 600-200 MG-UNIT per tablet Take 1 tablet by mouth 2 (two) times daily Disp: 60 tablet Rfl: 11   omeprazole (PRILOSEC) 20 MG capsule Take 1 capsule by mouth 2 (two) times daily before meals Disp: 60 capsule Rfl: 11   cholecalciferol (VITAMIN D3) 2000 UNIT tablet Take 1 tablet by mouth daily Disp: 90 tablet Rfl: 3   metoprolol (TOPROL-XL) 100 MG 24 hr tablet Take 1 tablet by mouth daily Disp: 30 tablet Rfl: 11     No current facility-administered medications on file prior to visit.     Health Maintenance:  Last eye exam- 04/2017  Last dental exam- 5 months ago.   Last colonoscopy- NEVER  Last bone density- NEVER  PPD or QuantiFERON TB Gold- negative, but unsure of date    PEG SCORE (CHRONIC PAIN) due on 03/15/1982  AWQ Questionnaire due on  01/31/2018  HEALTH CARE PROXY due on 02/25/2018  FECAL OCCULT BLOOD AGE 10+ due on 03/21/2018  LIPID SCREENING due on 10/09/2018  MAMMOGRAPHY due on 02/14/2019  PAP SMEAR due on 06/30/2019  HPV SCREENING due on 06/30/2019  TDAP/TD VACCINE(2 - Td) due on 03/08/2026  HIV SCREENING Completed  HEP C SCREEN (DOB [birth year]) Completed  INFLUENZA VACCINE Completed  PHYSICAL EXAM Completed; No procedure found.    ROS:  Review of Systems: Constitutional, Eyes, ENT/Mouth, Cardiovascular, Respiratory, GI, GU, Neuro, Psych, Heme/Lymph, Skin, Musculoskeletal, and Endocrine systems were reviewed and are NEGATIVE, except for what is documented in the note.    PE:   09/10/17  1452   BP: 110/75   Pulse: 93   Resp: 18   Temp: 98.4 F (36.9 C)   SpO2: 98%   Weight: 83 kg (183 lb)   Height: '5\' 1"'  (1.549 m)     Body mass index is 34.58 kg/m.  GENERAL: NAD. Normocephalic/Atraumatic. Alert and oriented to person, place, and time. Good eye contact. Full affect. Follows commands appropriately.  Well-groomed.    HEENT: Pupils were equal, round and reactive. Extraocular motions are intact. Mucous membranes are moist. There is no scalp rash. No malar rash. Anicteric sclerae. Non-injected conjunctivae. No nystagmus. Oropharynx clear. Tongue is midline. Symmetric palate raise.  No ulcerations. No exudate. No erythema.  Tympanic membranes are clear.  No parotid enlargement.   NECK: Supple. No appreciable cervical lymphadenopathy.  LUNGS: Clear to auscultation without wheezes, rhonchi, rales, or crackles.  CV: S1/S2 normal, without murmurs, gallops, or rubs.  ABDOMEN:  Bowel sounds in all four quadrants, soft, nontender, nondistended. No appreciable organomegaly.  EXTREMITIES: Warm and well-perfused without any sign of cyanosis, clubbing, or edema.  SKIN: No rashes, nodules, bruising, hyperpigmentation, hypopigmentation, telangiectasias, periungal erythema, splinter hemorrhages, sclerodermatous changes, icterus, petechiae, palpable purpura,  tethering, striae, keloid, nail abnormalities or tophi.    MUSCULOSKELETAL:   Hypertrophy of PIPs noted.  No triggering of fingers noted.  Grip strength is reduced on the right compared to the left.  No swelling in the hands.  No swelling in the wrists.  Unable to provoke feelings of pain or Tinel's.  No swelling in elbows. No swelling in the shoulders.  No restriction in range of motion in fingers, wrists, elbows, or shoulders.   No tender joints in the fingers, wrists, elbows or shoulders.  Negative empty can sign bilaterally.  No pain over the sternoclavicular joint, AC joint or TMJs.   No restriction in ROM of the cervical spine.  No focal back tenderness.   No restriction in range of motion in hips, knees, or ankles.   No swelling in the knees or ankles.   No knee tenderness.  Bilateral valgus deformity of the knees with hypertrophic changes and crepitations.  No Achille's swelling or tenderness.  There is no tenderness on palpation of the MTPs.    No dactylitis or splaying of the toes.  NEURO: Cranial nerves II-XII intact. Patellar reflexes, Achilles reflexes and brachioradialis reflexes are 2/2 and symmetric. Narrow-based gait.  Power is 5/5 in all four limbs and without weakness. No difficulty getting up from seated position without using hands or out of a supine position.  No assistive ambulatory devices.    LABORATORY DATA:  Blood Counts  Lab Results   Component Value Date    WBC 10.4 04/06/2017    HGB 13.6 04/06/2017    HCT 40.8 04/06/2017    MCV 87.6 04/06/2017    PLTA 208 04/06/2017      Chemistries  Lab Results   Component Value Date    CA 9.5 04/06/2017    NA 143 04/06/2017    K 3.3 (L) 04/06/2017    CO2 26 04/06/2017  CL 102 04/06/2017    BUN 19 (H) 04/06/2017    CREAT 0.9 04/06/2017     CrCl cannot be calculated (Patient's most recent lab result is older than the maximum 7 days allowed.).    Liver Function Tests  Lab Results   Component Value Date    ALT 43 04/06/2017    AST 28 04/06/2017     ALBUMIN 4.2 04/06/2017    ALKPHOS 117 04/06/2017     Acute Phase Reactants  Lab Results   Component Value Date    ESR 12 01/31/2017    CRP 0.8 01/31/2017     Rheumatologic Labs    Lab Results   Component Value Date    RF POSITIVE (A) 01/31/2017   Rheumatid Factor (01/2017): 1:2    Lab Results   Component Value Date    CCPAB < 0.5 01/31/2017     Other Labs  Lab Results   Component Value Date    HGBA1C 5.2 06/30/2008     Lab Results   Component Value Date    TSHSC 1.380 02/07/2016     VITAMIN D,25 HYDROXY (ng/mL)   Date Value   01/31/2017 23 (L)     Lab Results   Component Value Date    CKTOTAL 95 06/16/2008     Lab Results   Component Value Date    LDL 129 10/08/2013     HEPATITIS B SURFACE ANTIGEN (no units)   Date Value   04/04/2006 NON-REACTIVE     HEPATITIS B SURFACE ANTIBODY (no units)   Date Value   04/04/2006 NEGATIVE     HEPATITIS C ANTIBODY (no units)   Date Value   04/04/2006 NEGATIVE     Imaging reviewed personally by me today:  Left Hand Xray (February 2019):  Left hand x-ray consistent with osteoarthritis.     Chest Xray (11/2016):  No pneumothorax or pneumonia.     Right Ankle Xray (01/2015):  Lateral ankle soft tissue swelling. No fracture.     Bilateral Knee Xray (03/2013):  1. Tiny marginal osteophytes along the right medial tibiofemoral   compartment. 2. Normal alignment and preservation of the cartilage spaces.     Right Hand Xray (02/2008):  Unremarkable right hand radiographs.    Lumbar Xray (10/2007):  Normal alignment of the vertebral bodies. Vertebral body heights are well  maintained. An peritumoral disc spaces are normal. No acute fracture,  subluxation or dislocation. No evidence of facet disease. Evidence of   coarse interarticularis defect. SI joints are normal.    Assessment: Lorella Gomez is a 52 year old [place] speaking female with severe esophagitis, allergy to NSAIDs here today with right lateral epicondylitis and symptomatic right carpal tunnel  syndrome.  She has had resolution of right shoulder pain after corticosteroid injection at the last visit.  She also had significant improvement in triggering of the right middle finger after cortical steroid injection.    Plan/Recommendations:  1.  Right lateral epicondylitis.  Advised the patient to apply Voltaren gel to the right lateral elbow up to 4 times a day.  She should also brace using a counterforce brace especially during times of forced supination.  We will hold off on corticosteroid injection at this time as I expect her to do well with conservative management.  I referred her to physical therapy for stretching strengthening exercises and ultrasound heat.    2.  Right carpal tunnel syndrome.  I advised the patient to brace nightly for 3 months.  We briefly discussed the utility of EMG and diagnosis of her symptoms.  She is somewhat hesitant to have this test as her mother had an EMG and she is afraid about the pain associated.  I did explain to the patient that if symptoms do not improve by the next visit would likely consider carpal tunnel injection and referral to physiatry for EMG.    HEALTH MAINTENANCE: N/A  EDUCATION:  The total face to face time with patient was 15 minutes.  Greater than 50% of that time was spent on counseling/educating patient regarding potential etiologies, pathophysiology, treatment options, need for further work-up including blood work, imaging and referrals, reviewing previous lab results and radiology findings, reviewing internal and external medical records, and coordinating medical care, outside of the time needed to perform any procedures.      REMINDERS FOR NEXT VISIT: EMG if right-sided symptoms do not improve.    I discussed the likely diagnosis(es),  prognosis(es), and the various treatment options in detail with the patient, including observation.     Risks and benefits of the treatment plan(s) were discussed.     The patient's questions have been addressed and  answered.     We discussed importance of medication compliance.     The patient was ready to learn and no apparent learning barriers were identified.     Follow-up in 3 months, earlier if needed.    Deborah [name] verbally expressed an understanding and agreement with the plan as outlined above.    This dictation was made using voice recognition software.  There is potential for transcription errors and incorrect word usage based on limitations of the software.      Tisha Cline Adolf-Ubokudom, MD, [facility]ious Payer.

## 2017-09-25 ENCOUNTER — Ambulatory Visit (HOSPITAL_BASED_OUTPATIENT_CLINIC_OR_DEPARTMENT_OTHER): Payer: No Typology Code available for payment source

## 2017-09-25 DIAGNOSIS — M7711 Lateral epicondylitis, right elbow: Secondary | ICD-10-CM

## 2017-09-25 NOTE — Progress Notes (Signed)
I certify that the documented Treatment Plan is reasonable and necessary.    09/25/2017  Jemia Fata L Adolf-Ubokudom, MD

## 2017-09-25 NOTE — Progress Notes (Signed)
OUTPATIENT PHYSICAL THERAPY EVALUATION    Referring Physician: Emem L Adolf-Ubokudom    Subjective:Pt presents today with a CC of R elbow and hand pain. Pt states the pain started 9-10 months ago. Pt unclear on MOI. Pt states R elbow pain is constant which increases when making a fist. Pt reports RHD. Pt denies shooting pain but reports numbness in R hand. Pt reports intermittent numbness at night. Pt has been cuing compression brace R elbow. Pt reports relief from sxs with ice.     Date of Onset:8-9 months.   Pain: See flow sheet data above    Occupation: House cleaner for 17 years  Reported function limitations Gripping, driving, opening a jar, combing hair, sleeping    Contraindications/Precautions: H/O SVT    PMH: Past Medical History:  No date: Esophageal reflux  05/18/2017: Hyperopia of both eyes with astigmatism and presbyopia  No date: Irregular menstrual cycle  05/18/2017: Macular chorioretinal scar of left eye      Comment:  Temporal to fovea, left eye  No date: Pregnant state, incidental      Comment:  c/s breech  3/10: SVT (supraventricular tachycardia) (HCC)      Comment:  TCH    PSH: Past Surgical History:  No date: CESAREAN DELIVERY ONLY      Comment:  breech  No date: [place] BREAST SPECIMEN      Comment:  s/p fibroadenoma on the L,   No date: SEPTOPLASTY/SUBMUCOUS RESECJ W/WO CARTILAGE GRF      Medications: See chart.       Mental Status/Communication: Pt A&O X 3  Learns Best: Verbal  Primary Language: English; Requires Interpreter: No     Objective:        09/25/17 1000   Language Information   Language of Care English   Interpreter No   Evaluation Type   Evaluation Type Initial Evaluation   Rehab Discipline   Rehab Discipline PT   Visit   Visit number 1   POC Due date 10/25/17   Pain   Pain Score 6    Spine Assessment   Spine Assessment Cervical  (WNL aside from UT tension)   RUE AROM (degrees)   R Shoulder Flexion  0-170 170 Degrees   R Shoulder ABduction 0-140 170 Degrees   R Elbow Extension 0-135-150 0  Degrees   R Elbow Flexion/Extension 0-135-150 135 Degrees   R Forearm Pronation  0-80-90 80 Degrees   R Forearm Supination  0-80-90 90 Degrees   R Wrist Flexion 0-80 80 Degrees   R Wrist Extension 0-70 70 Degrees  (pain)   R Wrist Radial Deviation 0-20 20 Degrees   R Wrist Ulnar Deviation 0-30 30 Degrees   RUE PROM (degrees)   R Forearm Pronation  0-80-90 90 Degrees   R Forearm Supination  0-80-90 90 Degrees   R Wrist Flexion 0-80 80 Degrees   R Wrist Extension 0-70 70 Degrees   R Wrist Radial Deviation 0-20 20 Degrees   R Wrist Ulnar Deviation 0-30 30 Degrees   R Elbow Extension 0-135-150 0 Degrees   R Elbow Flexion/Extension 0-135-150 135 Degrees   RUE Strength   R Elbow Flexion 4-/5  (pain)   R Elbow Extension 4/5   R Forearm Pronation 4-/5  (pain)   R Forearm Supination 4-/5  (pain)   R Wrist Flexion 4/5   R Wrist Extension 4-/5  (pain)   R Wrist Radial Deviation 4/5   R Wrist Ulnar Deviation 4/5  (pain)  Patient Education   What was taught? HEP, POC, SXS mang, DX   Method Verbal;Demo;Practice;Written   Patient comprehension Yes         Postural assessment: No deviations noted  Gait: No deviations noted  Palpation: Mod TTP to lateral epicondyle and common extensor tendon. Mod TTP to ulnar boar  AROM/PROM: See flow sheet data above.    Manual Muscle testing: See flow sheet data above.  Resisted brachioradialis test (+) pain  Intrinsic strength 4-/5 (lumbricals and Dosal adductors)   Special Tests:  1) UCL stress (-)  2) RCL stress (-)  3) Tinnel at cubital tunnel (+) R  4) Ulnar nerve tension test (-)             Physical Therapy Plan of Care    MD: Helane Gunther, APRN  Diagnosis: Signs and symptoms consistent with diagnosis of  Rlateral epicondylitis ulnar nerve neuropathy     Assessment/Objective Findings:   Patient is a 52 year old female with complains of pain in her right elobw. 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 She reports over the last 8-9 months gradual onset of right elbow pain. Pt also  reports numbness and tingling in R hand when sleeping at night. AROM of cervical spine WNL and no reproduction of radicular sxs. AROM of R shoulder WNL with mild discomfort due to reports of chronic shoulder pain. AROM of R elbow and Wrist WNL with pain at cubital tunnel with elbow flexion. Resisted pronation/supination/extesnsion/and ulnar deviation increased pain and weakness present. Resisted brachioradials flexion increased R elbow pain. Mild weakness noted in intrinsic strength but no wasting of hand present. Tinnel (+) at cubital tunnel. Pt advised on sleeping posture to reduce stress on ulnar nerve. At this time pt's signs and sxs, clinical presentation, and exam suggest lateral epicondylitis with ulnar nerve neuropathy. Pt would benefit from skilled physical therapy to address impairments outlined above to improve functional mobility.          Short Term Functional Goals: 3 weeks  1) Pt will improve pain to 0-4 in order to sleep >6 hours  2) Pt will improve R elbow and wrist strength to 4/5 in order to open the lid of a jar.   3) Pt will improve intrinsic muscle to hold a comb while brushing hair.     Long Term Goal: 6 weeks  1) Pt will improve pain to 0-2 in order to clean house.   2) Pt will improve R elbow and wrist strength to 5/5 in order to grip steering wheel while driving community distances.   4) Pt will be independent in sleeping posture to reduce ulnar nerve stress.     Treatment Plan:  ** PT Eval - Low Complexity (CPT [phone])  ** Stretching/ROM/Therapeutic Exercise [id number])  ** Home Exercise Program/ Patient Education (CPT [phone])  ** Neuromuscular Re-education (CPT [phone])  ** Manual Therapy / Joint / Soft tissue Mobilization (CPT 97140)  ** Electrical Stimulation/TENS (CPT 97014)  ** Hot/Cold Rx (CPT 97010)    Recommend skilled physical therapy services for 1-6 times per week for 6 weeks.  The rehabilitation potential for this patient is good. Clinical presentation is stable.     Patient Regis Bill is  aware of attendance policy: Yes  Plan of care discussed with Patient/Family: Yes  Patient goals reviewed and incorporated in plan of care: Yes  Patient/Family agrees with plan of care: Yes  Patient/Family education: Yes  Does patient feel safe at home: Yes  Delrae Rend, [place], Lic # [id number]

## 2017-10-02 ENCOUNTER — Ambulatory Visit (HOSPITAL_BASED_OUTPATIENT_CLINIC_OR_DEPARTMENT_OTHER): Payer: No Typology Code available for payment source

## 2017-10-16 ENCOUNTER — Ambulatory Visit (HOSPITAL_BASED_OUTPATIENT_CLINIC_OR_DEPARTMENT_OTHER): Payer: No Typology Code available for payment source | Admitting: Rehabilitative and Restorative Service Providers"

## 2017-10-16 DIAGNOSIS — M7711 Lateral epicondylitis, right elbow: Secondary | ICD-10-CM

## 2017-10-16 NOTE — Progress Notes (Signed)
10/16/17 1600   Language Information   Language of Care English   Interpreter No   Rehab Discipline   Rehab Discipline PT   Visit   Visit number 2   POC Due date 11/15/17   Time Calculation   Start Time 1630   Stop Time 1710   Time Calculation (min) 40 min   Pain   Pain Score 6    Manual Therapy   Technique IASTM to wrist extensiors    Time in minutes 10   Ther Exercise   Exercise Wrist flexor stretch   Reps 3   Holds 30   Ther Exercise 2   Exercise Wrist extensor stretch   Reps 2 3   Holds 2 30   Ther Exercise 3   Exercise 3 eccentric wrist ext 2#    Reps 3 10   Sets 3 2   Ther Exercise 4   Exercise 4 forearm pronation/supination 2# arm supported    Reps 4 10   Sets 4 2   Cold pack   Joints Right;Elbow   Position Seated   Time in minutes 10   Patient Education   What was taught? cont HEP of above    Method Practice;Demo;Verbal   Patient comprehension Yes     Lovett Sox. Aldine Contes, [place] # [id number]

## 2017-10-16 NOTE — Progress Notes (Signed)
S. Pt reports her pain feels about the same she does note improvement when using her wrist splint at night. She did have a few days with min pain when she was not working as much.   O.Please see Rehab Flow Sheet  A. Discussed work modifications and avoiding excessive wrist movements with cleaning to avoid increase pain. Completed IASTM and eccentric wrist exercises to promote healing. Pt with some soreness. Discussed HEP. Pt with understanding   P. Cont to address myofascial restrictions and eccentric exercises.     Deborah [name]. Aldine Contes, [place] # [id number]

## 2017-10-23 ENCOUNTER — Ambulatory Visit (HOSPITAL_BASED_OUTPATIENT_CLINIC_OR_DEPARTMENT_OTHER): Payer: No Typology Code available for payment source | Admitting: Rehabilitative and Restorative Service Providers"

## 2017-10-23 DIAGNOSIS — M7711 Lateral epicondylitis, right elbow: Secondary | ICD-10-CM

## 2017-10-23 NOTE — Progress Notes (Signed)
10/23/17 1500   Language Information   Language of Care English   Interpreter No   Rehab Discipline   Rehab Discipline PT   Visit   Visit number 3   POC Due date 11/15/17   Time Calculation   Start Time 1600   Stop Time 1640   Time Calculation (min) 40 min   Pain   Pain Score 5    Manual Therapy   Technique IASTM to R wrist extensiors    Time in minutes 10   Manual Therapy 2   Technique gentle radial glide proximal grade 2-3    Time in minutes 3   Ther Exercise   Exercise Wrist flexor stretch   Reps 3   Holds 30   Ther Exercise 2   Exercise Wrist extensor stretch   Reps 2 3   Holds 2 30   Ther Exercise 3   Exercise 3 eccentric wrist ext 2#    Reps 3 10   Sets 3 2   Ther Exercise 4   Exercise 4 forearm pronation/supination 2# arm supported    Reps 4 10   Sets 4 2   Ther Exercise 5   Exercise 5 rows FT 35#    Reps 5 10   Sets 5 2   Cold pack   Joints Right;Elbow;Shoulder   Position Seated   Time in minutes 10   Patient Education   What was taught? cont HEP    Method Verbal;Practice;Demo   Patient comprehension Yes     Lovett Sox. Aldine Contes, [place] # [id number]

## 2017-10-23 NOTE — Progress Notes (Signed)
S. Pt reports she feels a little better, has 6/10 pain in her elbow.   O.Please see Rehab Flow Sheet  A. Pt with decreased TTP over common extensor tendon, pain is centralize to lateral elbow. Pt with cues to loosen grip with therex. Pt reported some relief with this. Initiated periscap strengthening for improved shoulder stability to min stress of lateral epicondyle. Pt with cues to keep wrist into neutral.   P. Cont to address eccentric wrist extensor exercises, myofascial restrictions and periscap strengthening.     Lovett Sox. Aldine Contes, [place] # [id number]

## 2017-10-30 ENCOUNTER — Ambulatory Visit
Payer: No Typology Code available for payment source | Attending: Physical Medicine & Rehabilitation | Admitting: Rehabilitative and Restorative Service Providers"

## 2017-10-30 DIAGNOSIS — M7711 Lateral epicondylitis, right elbow: Secondary | ICD-10-CM | POA: Diagnosis not present

## 2017-10-30 NOTE — Progress Notes (Signed)
10/30/17 1600   Language Information   Language of Care English   Interpreter No   Rehab Discipline   Rehab Discipline PT   Visit   Visit number 4   POC Due date 11/15/17   Time Calculation   Start Time 1630   Stop Time 1710   Time Calculation (min) 40 min   Pain   Pain Score 7    Manual Therapy   Technique IASTM to R wrist extensiors    Time in minutes 10   Manual Therapy 2   Technique gentle radial glide proximal grade 2-3    Time in minutes 3   Ther Exercise 2   Exercise Wrist extensor stretch   Reps 2 3   Holds 2 30   Ther Exercise 3   Exercise 3 CGPD 35#   Reps 3 15   Sets 3 2   Ther Exercise 4   Exercise 4 RTB thera stick wringing    Reps 4 10   Sets 4 2   Ther Exercise 5   Exercise 5 rows FT 35#    Reps 5 15   Sets 5 2   Cold pack   Joints   (pt declined )   Patient Education   What was taught? cont HEP    Method Verbal;Practice;Demo   Patient comprehension Yes     Lovett Sox. Aldine Contes, [place] # [id number]

## 2017-10-30 NOTE — Progress Notes (Signed)
S. Pt reports some days are better than others. Pain today is 7/10.   O.Please see Rehab Flow Sheet  A. Pt continues to have TTP over radial head and distal lateral humerus. Completed IASTM to wrist extensors which pt continues to get relief from. Initiated scap strengthening with cues to prevent excessive grip and wrist extension. Pt req mod cues for this.   P. Trail prone scap exercises next session.     Lovett Sox. Aldine Contes, [place] # [id number]

## 2017-11-06 ENCOUNTER — Ambulatory Visit (HOSPITAL_BASED_OUTPATIENT_CLINIC_OR_DEPARTMENT_OTHER): Payer: No Typology Code available for payment source | Admitting: Rehabilitative and Restorative Service Providers"

## 2017-11-13 ENCOUNTER — Ambulatory Visit
Payer: No Typology Code available for payment source | Attending: Physical Medicine & Rehabilitation | Admitting: Rehabilitative and Restorative Service Providers"

## 2017-11-13 DIAGNOSIS — M7711 Lateral epicondylitis, right elbow: Secondary | ICD-10-CM | POA: Insufficient documentation

## 2017-11-15 NOTE — Progress Notes (Signed)
11/13/17 1700   Language Information   Language of Care English   Interpreter No   Rehab Discipline   Rehab Discipline PT   Visit   Visit number 5   POC Due date 11/15/17   Time Calculation   Start Time 0500   Stop Time 0530   Time Calculation (min) 30 min   Pain   Pain Score 0    Manual Therapy   Technique IASTM to R wrist extensiors    Time in minutes 10   Manual Therapy 2   Technique gentle radial glide proximal grade 2-3    Time in minutes 3   Ther Exercise 2   Exercise Wrist extensor stretch   Reps 2 3   Holds 2 30   Ther Exercise 3   Exercise 3 Prone scap retraction   Reps 3 10   Sets 3 2   Ther Exercise 5   Exercise 5 Prone Is   Reps 5 10   Sets 5 2   Patient Education   What was taught? cont HEP    Method Verbal   Patient comprehension Yes     S: Pt reports no pain right now. She reports it is on and off but is getting better.  O: Refer to Rehabilitation Treatment Flowsheet  A: Continued w gentle mobilizations, and STM/IASTM. Pt reports pain at proximal wrist extensors w TD and mild swelling present. Continued w stretching and progress periscap strengthening.  P: Continue ot progress periscap and AROM.     Ilda Basset, PTA, Lic # [id number]

## 2017-11-20 ENCOUNTER — Ambulatory Visit (HOSPITAL_BASED_OUTPATIENT_CLINIC_OR_DEPARTMENT_OTHER): Payer: No Typology Code available for payment source | Admitting: Rehabilitative and Restorative Service Providers"

## 2017-11-20 NOTE — Progress Notes (Deleted)
OUTPATIENT PHYSICAL THERAPY EVALUATION    Referring Physician: Emem L Adolf-Ubokudom    Subjective:Pt presents today with a CC of R elbow and hand pain. Pt states the pain started 9-10 months ago. Pt unclear on MOI. Pt states R elbow pain is constant which increases when making a fist. Pt reports RHD. Pt denies shooting pain but reports numbness in R hand. Pt reports intermittent numbness at night. Pt has been cuing compression brace R elbow. Pt reports relief from sxs with ice.     Date of Onset:8-9 months.   Pain: See flow sheet data above    Occupation: House cleaner for 17 years  Reported function limitations Gripping, driving, opening a jar, combing hair, sleeping    Contraindications/Precautions: H/O SVT    PMH: Past Medical History:  No date: Esophageal reflux  05/18/2017: Hyperopia of both eyes with astigmatism and presbyopia  No date: Irregular menstrual cycle  05/18/2017: Macular chorioretinal scar of left eye      Comment:  Temporal to fovea, left eye  No date: Pregnant state, incidental      Comment:  c/s breech  3/10: SVT (supraventricular tachycardia) (HCC)      Comment:  TCH    PSH: Past Surgical History:  No date: CESAREAN DELIVERY ONLY      Comment:  breech  No date: [place] BREAST SPECIMEN      Comment:  s/p fibroadenoma on the L,   No date: SEPTOPLASTY/SUBMUCOUS RESECJ W/WO CARTILAGE GRF      Medications: See chart.       Mental Status/Communication: Pt A&O X 3  Learns Best: Verbal  Primary Language: English; Requires Interpreter: No       11/20/2017  ***    Objective:        09/25/17 1000 11/20/2017     Language Information    Language of Care English    Interpreter No    Evaluation Type    Evaluation Type Initial Evaluation    Rehab Discipline    Rehab Discipline PT    Visit    Visit number 1    POC Due date 10/25/17    Pain    Pain Score 6     Spine Assessment    Spine Assessment Cervical  (WNL aside from UT tension)    RUE AROM (degrees)    R Shoulder Flexion  0-170 170 Degrees    R Shoulder ABduction  0-140 170 Degrees    R Elbow Extension 0-135-150 0 Degrees    R Elbow Flexion/Extension 0-135-150 135 Degrees    R Forearm Pronation  0-80-90 80 Degrees    R Forearm Supination  0-80-90 90 Degrees    R Wrist Flexion 0-80 80 Degrees    R Wrist Extension 0-70 70 Degrees  (pain)    R Wrist Radial Deviation 0-20 20 Degrees    R Wrist Ulnar Deviation 0-30 30 Degrees    RUE PROM (degrees)    R Forearm Pronation  0-80-90 90 Degrees    R Forearm Supination  0-80-90 90 Degrees    R Wrist Flexion 0-80 80 Degrees    R Wrist Extension 0-70 70 Degrees    R Wrist Radial Deviation 0-20 20 Degrees    R Wrist Ulnar Deviation 0-30 30 Degrees    R Elbow Extension 0-135-150 0 Degrees    R Elbow Flexion/Extension 0-135-150 135 Degrees    RUE Strength    R Elbow Flexion 4-/5  (pain)    R Elbow Extension  4/5    R Forearm Pronation 4-/5  (pain)    R Forearm Supination 4-/5  (pain)    R Wrist Flexion 4/5    R Wrist Extension 4-/5  (pain)    R Wrist Radial Deviation 4/5    R Wrist Ulnar Deviation 4/5  (pain)    Patient Education    What was taught? HEP, POC, SXS mang, DX    Method Verbal;Demo;Practice;Written    Patient comprehension Yes          Postural assessment: No deviations noted  Gait: No deviations noted  Palpation: Mod TTP to lateral epicondyle and common extensor tendon. Mod TTP to ulnar boar  AROM/PROM: See flow sheet data above.    Manual Muscle testing: See flow sheet data above.  Resisted brachioradialis test (+) pain  Intrinsic strength 4-/5 (lumbricals and Dosal adductors)   Special Tests:  1) UCL stress (-)  2) RCL stress (-)  3) Tinnel at cubital tunnel (+) R  4) Ulnar nerve tension test (-)             Physical Therapy Plan of Care    MD: Helane Gunther, APRN  Diagnosis: Signs and symptoms consistent with diagnosis of  Rlateral epicondylitis ulnar nerve neuropathy     Assessment/Objective Findings:   Patient is a 52 year old female with complains of pain in her right elobw. 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  She reports over the last 8-9 months gradual onset of right elbow pain. Pt also reports numbness and tingling in R hand when sleeping at night. AROM of cervical spine WNL and no reproduction of radicular sxs. AROM of R shoulder WNL with mild discomfort due to reports of chronic shoulder pain. AROM of R elbow and Wrist WNL with pain at cubital tunnel with elbow flexion. Resisted pronation/supination/extesnsion/and ulnar deviation increased pain and weakness present. Resisted brachioradials flexion increased R elbow pain. Mild weakness noted in intrinsic strength but no wasting of hand present. Tinnel (+) at cubital tunnel. Pt advised on sleeping posture to reduce stress on ulnar nerve. At this time pt's signs and sxs, clinical presentation, and exam suggest lateral epicondylitis with ulnar nerve neuropathy. Pt would benefit from skilled physical therapy to address impairments outlined above to improve functional mobility.      11/20/2017    Pt is a 52 year old y/o female who presents today after completing *** visits of physical therapy for ***. Pt has made *** improvements thus far objectively in ***, which has allowed for improved ***. However, *** does continue to have deficits in ***. These deficits have continued to impact her ability to ***. Pt continues to be motivated to improve and compliant with *** HEP. She would benefit from continued skilled physical therapy to address her current impairments and allow for return to prior level of function.         Short Term Functional Goals: 3 weeks  1) Pt will improve pain to 0-4 in order to sleep >6 hours  2) Pt will improve R elbow and wrist strength to 4/5 in order to open the lid of a jar.   3) Pt will improve intrinsic muscle to hold a comb while brushing hair.     Long Term Goal: 6 weeks  1) Pt will improve pain to 0-2 in order to clean house.   2) Pt will improve R elbow and wrist strength to 5/5 in order to grip steering wheel while driving community  distances.    4) Pt will be independent in sleeping posture to reduce ulnar nerve stress.     Treatment Plan:  ** PT Eval - Low Complexity (CPT [phone])  ** Stretching/ROM/Therapeutic Exercise [phone])  ** Home Exercise Program/ Patient Education (CPT [phone])  ** Neuromuscular Re-education (CPT [id number])  ** Manual Therapy / Joint / Soft tissue Mobilization (CPT 97140)  ** Electrical Stimulation/TENS (CPT 97014)  ** Hot/Cold Rx (CPT 97010)    Recommend skilled physical therapy services for 1 times per week for 6 weeks.  The rehabilitation potential for this patient is good. Clinical presentation is stable.     Patient Regis Bill is aware of attendance policy: Yes  Plan of care discussed with Patient/Family: Yes  Patient goals reviewed and incorporated in plan of care: Yes  Patient/Family agrees with plan of care: Yes  Patient/Family education: Yes  Does patient feel safe at home: Yes      Estill Cotta, PT, Lic # [id number]

## 2017-12-03 ENCOUNTER — Ambulatory Visit: Payer: No Typology Code available for payment source | Admitting: Rehabilitative and Restorative Service Providers"

## 2017-12-03 DIAGNOSIS — M7711 Lateral epicondylitis, right elbow: Secondary | ICD-10-CM

## 2017-12-03 NOTE — Progress Notes (Signed)
12/03/17    This patient was seen today with dx of right lateral epicondylitis. Due to a scheduling error this patient was scheduled for an OT evaluation for right lateral epicondylitis today, however is currently participating in skilled PT for the same diagnosis. A follow up skilled PT appointment was booked following this screen today. This patient is in agreement.     Talmadge Coventry, [place], Lic # [id number]

## 2017-12-04 ENCOUNTER — Ambulatory Visit: Payer: No Typology Code available for payment source | Attending: Internal Medicine | Admitting: Internal Medicine

## 2017-12-04 VITALS — BP 108/72 | HR 90 | Ht 61.0 in | Wt 182.0 lb

## 2017-12-04 DIAGNOSIS — R002 Palpitations: Secondary | ICD-10-CM | POA: Insufficient documentation

## 2017-12-04 DIAGNOSIS — E876 Hypokalemia: Secondary | ICD-10-CM | POA: Insufficient documentation

## 2017-12-04 DIAGNOSIS — I471 Supraventricular tachycardia: Secondary | ICD-10-CM | POA: Diagnosis present

## 2017-12-04 LAB — BASIC METABOLIC PANEL
ANION GAP: 8 mmol/L (ref 5–15)
BUN (UREA NITROGEN): 13 mg/dL (ref 7–18)
CALCIUM: 8.9 mg/dL (ref 8.5–10.1)
CARBON DIOXIDE: 28 mmol/L (ref 21–32)
CHLORIDE: 107 mmol/L (ref 98–107)
CREATININE: 1 mg/dL (ref 0.4–1.2)
ESTIMATED GLOMERULAR FILT RATE: 58 mL/min — ABNORMAL LOW (ref >60)
Glucose Random: 99 mg/dL (ref 74–160)
POTASSIUM: 3.9 mmol/L (ref 3.5–5.1)
SODIUM: 143 mmol/L (ref 136–145)

## 2017-12-04 NOTE — Addendum Note (Signed)
Addended by: Werner Lean on: 12/04/2017 05:35 PM     Modules accepted: Orders

## 2017-12-04 NOTE — Progress Notes (Signed)
OUTPATIENT CARDIOLOGY PROGRESS NOTE  Date of visit: 12/04/2017    Deborah [name] is a 52 year old [place] mother of 59, a 58 year old son, who lives with her and is starting his senior year at [employer], and who plans to go on to college to study real estate and business.  Her problem list includes GERD, rapid palpitations, SVT to 240 seen 8 years ago, and wheezing.     In summary patient is 52 year old female with the following medical problems:   Rapid Noctural Palpitations x 10 Seconds   SVT To 240 BPM March 2010. As Of 2017, Episodes q 2 Weeks, Usually Subside One Minute, Occ 15 Minutes.  Commonly Relieved With Vagal Maneuvers.   Rapid Palpitations Only Once/Month, Lasting Seconds, Abolished by Valsalva, Aug 2018.     Current Outpatient Medications on File Prior to Visit:  diclofenac (VOLTAREN) 1 % GEL Gel Apply to both hands, both elbows and both knees up to 4 times a day. Do Not Exceed 32 g in 24 Hours Disp: 100 g Rfl: 3   Calcium 600-200 MG-UNIT per tablet Take 1 tablet by mouth 2 (two) times daily Disp: 60 tablet Rfl: 11   omeprazole (PRILOSEC) 20 MG capsule Take 1 capsule by mouth 2 (two) times daily before meals Disp: 60 capsule Rfl: 11   cholecalciferol (VITAMIN D3) 2000 UNIT tablet Take 1 tablet by mouth daily Disp: 90 tablet Rfl: 3   metoprolol (TOPROL-XL) 100 MG 24 hr tablet Take 1 tablet by mouth daily Disp: 30 tablet Rfl: 11     No current facility-administered medications on file prior to visit.     Review of Patient's Allergies indicates:   Naproxen                Swelling    Comment:Ed visit with angioedema   Ibuprofen               Other (See Comments)    Comment:Epigastric pain, throat swelling?             05/06/12 pt states uses sometimes without issue   Pollen extract-tree*    Runny Nose    Comment:HA, itchy watery eyes    SOCIAL & FAMILY HISTORY: Were reviewed and are unchanged from my previous note.    Review of systems: All other systems reviewed and are negative except  for the issues cited below.     PHYSICAL EXAM:  General:  Alert and comfortable.   12/04/17  1620   BP: 108/72   Site: Right Arm   Position: Sitting   Cuff Size: Regular   Pulse: 90   SpO2: 98%   Weight: 82.6 kg (182 lb)   Height: 5\' 1"  (1.549 m)     HEENT: Ocular muscles are intact and the face is symmetric with midline tongue.  NECK:  No palpable cervical nodes or thyroid, with no JVD.  CHEST: Clear to auscultation, no crackles or rhonchi.  HEART: Regular with no  murmurs, clicks, or gallops/  ABDOMEN: No palpable organs, masses, or tenderness.  EXTREMITIES: No rash, clubbing, or cyanosis, with no edema.  NEURO: A&O X 3, no obvious motor or sensory deficits  PSYCHIATRIC: Mood and affect are appropriate.  SKIN: No rash.    PERTINENT INVESTIGATIONS:    1. EKG: (on my personal review)  04 Dec 2017:  NSR 75.  NORMAL.    2. LABS:   Lab Results   Component Value Date    NA 143  04/06/2017    K 3.3 (L) 04/06/2017    CL 102 04/06/2017    CO2 26 04/06/2017    BUN 19 (H) 04/06/2017    CREAT 0.9 04/06/2017    GLUCOSER 114 04/06/2017     Lab Results   Component Value Date    LDL 129 10/08/2013    HDL 48 10/08/2013    TG 223 (H) 10/08/2013     No results found for: PROBNP  No results found for: BNP          3. ECHO:   4. STRESS TEST:   5. HOLTER:    ASSESSMENT:      No orders of the defined types were placed in this encounter.      (R00.2) Rapid Noctural Palpitations x 10 Seconds  Comment: 3 nights in a row in July 2019.  Prior sxs 2 months earlier.  Nothing since July.  Awakened by very rapid palps lasting a few minutes.  Subsided before she considered going to ED.  Valsalva with effect, occ needed to progress to gag with benefit.  No obvious precipitant.      (I47.1) SVT To 240 BPM March 2010. As Of 2017, Episodes q 2 Weeks, Usually Subside One Minute, Occ 15 Minutes.  Commonly Relieved With Vagal Maneuvers.  Comment:  Seems likely to have been the arrhythmia she experienced last month.      (R00.2) Rapid Palpitations Only  Once/Month, Lasting Seconds, Abolished by Valsalva, July 2019.   Comment: Continue use of Vagal maneuvers.      Had TSH <2 years ago when she already had arrhythmias -- no need to repeat.    Her K+ was 3.3 in ED last Dec -- recheck now.  Exam unremarkable.    Able to walk 2 floors of stairs.   Walks 3 blocks -- doesn't really need to walk more.  Housecleaner -- vigorous work.      Her HR is 90 much of the time, so she should tolerate extra 50 mg metoprolol when her palps act up.  BP <110 so won't tolerate huge increase, but should be OK with 50 mng.    Call me for BMP result tomorrow.  See me one year.

## 2017-12-06 NOTE — Addendum Note (Signed)
Addended by: Norlene Campbell on: 12/06/2017 12:37 PM     Modules accepted: Orders

## 2017-12-07 ENCOUNTER — Telehealth (HOSPITAL_BASED_OUTPATIENT_CLINIC_OR_DEPARTMENT_OTHER): Payer: Self-pay | Admitting: Internal Medicine

## 2017-12-07 LAB — EKG

## 2017-12-11 ENCOUNTER — Ambulatory Visit (HOSPITAL_BASED_OUTPATIENT_CLINIC_OR_DEPARTMENT_OTHER): Payer: No Typology Code available for payment source | Admitting: Internal Medicine

## 2017-12-11 ENCOUNTER — Ambulatory Visit (HOSPITAL_BASED_OUTPATIENT_CLINIC_OR_DEPARTMENT_OTHER)
Admit: 2017-12-11 | Discharge: 2017-12-11 | Disposition: A | Discharge status: Home / Self Care | Payer: No Typology Code available for payment source

## 2017-12-11 ENCOUNTER — Encounter (HOSPITAL_BASED_OUTPATIENT_CLINIC_OR_DEPARTMENT_OTHER): Payer: Self-pay | Admitting: Internal Medicine

## 2017-12-11 ENCOUNTER — Ambulatory Visit
Admission: RE | Admit: 2017-12-11 | Discharge: 2017-12-11 | Disposition: A | Discharge status: Home / Self Care | Payer: No Typology Code available for payment source | Source: Ambulatory Visit | Attending: Internal Medicine | Admitting: Internal Medicine

## 2017-12-11 VITALS — BP 113/80 | HR 74 | Temp 98.0°F | Ht 61.0 in | Wt 182.0 lb

## 2017-12-11 DIAGNOSIS — G8929 Other chronic pain: Secondary | ICD-10-CM | POA: Diagnosis not present

## 2017-12-11 DIAGNOSIS — M25561 Pain in right knee: Secondary | ICD-10-CM | POA: Insufficient documentation

## 2017-12-11 DIAGNOSIS — M25562 Pain in left knee: Secondary | ICD-10-CM

## 2017-12-11 DIAGNOSIS — M7711 Lateral epicondylitis, right elbow: Secondary | ICD-10-CM

## 2017-12-11 DIAGNOSIS — M170 Bilateral primary osteoarthritis of knee: Secondary | ICD-10-CM

## 2017-12-11 DIAGNOSIS — M1812 Unilateral primary osteoarthritis of first carpometacarpal joint, left hand: Secondary | ICD-10-CM | POA: Diagnosis present

## 2017-12-11 NOTE — Progress Notes (Addendum)
[facility]  Rheumatology Follow-Up Patient Note    Date of Visit: 12/11/2017    Patient: Deborah [name]    PCP: Helane Gunther, APRN    Primary Language: [place] [name]  Interpreter: Cleophus Molt proficent.      Reason for Visit:  Osteoarthritis of hands, trigger finger, and elbow pain.  Last seen September 10, 2017.      History of present illness:  Deborah [name] who is a 52 year old [place] and [place]- speaking female with bilateral knee osteoarthritis, nonalcoholic steatohepatitis, history of H. pylori gastritis in 2008 treatment, history of severe esophagitis seen on most recent EGD 2014, former smoker.    She was seen during initial consultation June 06, 2017 for evaluation of inflammatory arthritis in the setting of polyarthralgia and low titer rheumatoid factor of 1-2.  At the end of that encounter, polyarthralgia was deemed to be due to mechanical causes including bilateral knee osteoarthritis, right shoulder pain due to osteoarthritis versus rotator cuff tendinopathy; right lateral epicondylitis, CMC osteoarthritis and right hand third digit stenosing tenosynovitis.  During this initial visit, she received corticosteroid injection to the right subacromial bursa as well as the right third palmar aspect of the MCP of the hand for stenosing tenosynovitis.  Given her history of esophagitis and severe allergy to NSAIDs we recommended topical Voltaren gel.    She presents today in follow-up.    Today she reports improvement and right lateral epicondylitis.  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  Otherwise notes improvement in symptoms.    Right wrist pain has also improved with bracing at night.  And otherwise is without pain complaints in this right wrist today.    Today she describes 5 out of 10 left CMC pain which is worse with activity.  She has nearly no pain at rest.  She has applied  topical Voltaren gel without modification in symptoms.  Occasionally she will take ibuprofen which she has a documented allergy to which appears to be angioedema, but needs notes that she has not had any improvement in symptoms with this intermittent use of oral NSAIDs.    Also notes bilateral knee pain of 5/10 worse in the right knee than the left.  She notes that she has pain with prolonged immobilization and reports stiffness lasting a few seconds prior to ambulating.  Notes some difficulty with maintaining a squat.    She is no longer having triggering of the right middle finger after corticosteroid injection several visits back.    Rheumatologic Problem List:  1.  Bilateral knee osteoarthritis.  2.  Right shoulder pain likely due to osteoarthritis versus rotator cuff tendinitis versus bursitis.  3.  Right lateral epicondylitis.  4.  Hand osteoarthritis, CMC osteoarthritis.  5.  Right third digit of the hand stenosing tenosynovitis.  6.  Low titer rheumatoid factor 1:2.  7.  NSAID allergy.    Past medical history:  Patient Active Problem List:     Lump or mass in breast     Gastroesophageal reflux disease without esophagitis     Other specified gastritis     Abdominal pain, epigastric     Angioneurotic edema not elsewhere classified     Plantar Fasciitis, L     Leg pain     Tobacco abuse, in remission     Triggering of Finger, R 3rd     Right subacromial bursitis/rotator cuff tendinopathy  Chondromalacia patellae     Lower back pain     De Quervain's Tenosynovitis, R     CTS (Carpal Tunnel Syndrome), b/l     Family history of diabetes mellitus (DM)     Hypolipoproteinemia     Intermittent spinal claudication (HCC)     Vitamin D deficiency     Greater Trochanteric Bursitis, b/l     DDD (degenerative disc disease), lumbar     Obesity     Rapid Noctural Palpitations x 10 Seconds     Sweating     Microscopic hematuria     Reflux esophagitis     Lump of skin of lower extremity     Bilateral knee pain     Bilateral  arm pain     Positive occult stool blood test     Chronic right-sided low back pain with right-sided sciatica     SVT To 240 BPM March 2010. As Of 2017, Episodes q 2 Weeks, Usually Subside One Minute, Occ 15 Minutes.  Commonly Relieved With Vagal Maneuvers.     BMI 33.0-33.9,adult     Rapid Palpitations Only Once/Month, Lasting Seconds, Abolished by Valsalva, Aug 2018.      Polyarthralgia     Macular chorioretinal scar of left eye     Hyperopia of both eyes with astigmatism and presbyopia     Osteoarthritis of both knees     Lateral epicondylitis of right elbow     Trigger middle finger of right hand     Osteoarthritis of carpometacarpal (CMC) joint of left thumb     Right shoulder injury    Past surgical history:  Past Surgical History:  No date: CESAREAN DELIVERY ONLY      Comment:  breech  No date: [place] BREAST SPECIMEN      Comment:  s/p fibroadenoma on the L,   No date: SEPTOPLASTY/SUBMUCOUS RESECJ W/WO CARTILAGE GRF    Allergies:  Review of Patient's Allergies indicates:   Naproxen                Swelling    Comment:Ed visit with angioedema   Ibuprofen               Other (See Comments)    Comment:Epigastric pain, throat swelling?             05/06/12 pt states uses sometimes without issue   Pollen extract-tree*    Runny Nose    Comment:HA, itchy watery eyes    Medications:    Current Outpatient Medications on File Prior to Visit:  diclofenac (VOLTAREN) 1 % GEL Gel Apply to both hands, both elbows and both knees up to 4 times a day. Do Not Exceed 32 g in 24 Hours Disp: 100 g Rfl: 3   Calcium 600-200 MG-UNIT per tablet Take 1 tablet by mouth 2 (two) times daily Disp: 60 tablet Rfl: 11   omeprazole (PRILOSEC) 20 MG capsule Take 1 capsule by mouth 2 (two) times daily before meals Disp: 60 capsule Rfl: 11   cholecalciferol (VITAMIN D3) 2000 UNIT tablet Take 1 tablet by mouth daily Disp: 90 tablet Rfl: 3   metoprolol (TOPROL-XL) 100 MG 24 hr tablet Take 1 tablet by mouth daily Disp: 30 tablet Rfl: 11     No current  facility-administered medications on file prior to visit.     Health Maintenance:  Last eye exam- 04/2017  Last dental exam- 5 months ago.   Last colonoscopy- NEVER  Last bone density- NEVER  PPD or QuantiFERON TB Gold- negative, but unsure of date    PEG SCORE (CHRONIC PAIN) due on 03/15/1982  INFLUENZA VACCINE(1) due on 12/09/2017  AWQ Questionnaire due on 01/31/2018  HEALTH CARE PROXY due on 02/25/2018  FECAL OCCULT BLOOD AGE 66+ due on 03/21/2018  LIPID SCREENING due on 10/09/2018  MAMMOGRAPHY due on 02/14/2019  PAP SMEAR due on 06/30/2019  HPV SCREENING due on 06/30/2019  TETANUS VACCINE(2 - Td) due on 03/08/2026  HIV SCREENING Completed  HEP C SCREEN (DOB [birth year]) Completed  PHYSICAL EXAM Completed  PNEUMOCOCCAL VACCINE SERIES (< 65) Completed; No procedure found.    ROS:  Review of Systems: Constitutional, Eyes, ENT/Mouth, Cardiovascular, Respiratory, GI, GU, Neuro, Psych, Heme/Lymph, Skin, Musculoskeletal, and Endocrine systems were reviewed and are NEGATIVE, except for what is documented in the note.    PE:   12/11/17  1509   Weight: 82.6 kg (182 lb)   Height: '5\' 1"'  (1.549 m)     Body mass index is 34.39 kg/m.  GENERAL: NAD. Normocephalic/Atraumatic. Alert and oriented to person, place, and time. Good eye contact. Full affect. Follows commands appropriately.  Well-groomed.    HEENT: Pupils were equal, round and reactive. Extraocular motions are intact. Mucous membranes are moist. There is no scalp rash. No malar rash. Anicteric sclerae. Non-injected conjunctivae. No nystagmus. Oropharynx clear. Tongue is midline. Symmetric palate raise.  No ulcerations. No exudate. No erythema.  Tympanic membranes are clear.  No parotid enlargement.   NECK: Supple. No appreciable cervical lymphadenopathy.  LUNGS: Clear to auscultation without wheezes, rhonchi, rales, or crackles.  CV: S1/S2 normal, without murmurs, gallops, or rubs.  ABDOMEN:  Bowel sounds in all four quadrants, soft, nontender, nondistended. No  appreciable organomegaly.  EXTREMITIES: Warm and well-perfused without any sign of cyanosis, clubbing, or edema.  SKIN: No rashes, nodules, bruising, hyperpigmentation, hypopigmentation, telangiectasias, periungal erythema, splinter hemorrhages, sclerodermatous changes, icterus, petechiae, palpable purpura, tethering, striae, keloid, nail abnormalities or tophi.    MUSCULOSKELETAL:   Hypertrophy of PIPs noted.  No triggering of fingers noted.  Grip strength is normal bilaterally.  Squaring of the left CMC noted.  No swelling in the hands.  No swelling in the wrists.  Unable to provoke feelings of pain or Tinel's.  No swelling in elbows. No swelling in the shoulders.  No restriction in range of motion in fingers, wrists, elbows, or shoulders.   No tender joints in the fingers, wrists, elbows or shoulders.  Negative empty can sign bilaterally.  No pain over the sternoclavicular joint, AC joint or TMJs.   No restriction in ROM of the cervical spine.  No focal back tenderness.   No restriction in range of motion in hips, knees, or ankles.   No swelling in the knees or ankles.   Positive patellar grind in the right knee.  Bilateral valgus deformity of the knees with hypertrophic changes and crepitations.  No Achille's swelling or tenderness.  There is no tenderness on palpation of the MTPs.    No dactylitis or splaying of the toes.  NEURO: Cranial nerves II-XII intact. Patellar reflexes, Achilles reflexes and brachioradialis reflexes are 2/2 and symmetric. Narrow-based gait.  Power is 5/5 in all four limbs and without weakness. No difficulty getting up from seated position without using hands or out of a supine position.  No assistive ambulatory devices.    LABORATORY DATA:  Blood Counts  Lab Results   Component Value Date    WBC 10.4 04/06/2017    HGB 13.6 04/06/2017  HCT 40.8 04/06/2017    MCV 87.6 04/06/2017    PLTA 208 04/06/2017      Chemistries  Lab Results   Component Value Date    CA 8.9 12/04/2017    NA 143  12/04/2017    K 3.9 12/04/2017    CO2 28 12/04/2017    CL 107 12/04/2017    BUN 13 12/04/2017    CREAT 1.0 12/04/2017     CrCl cannot be calculated (Unknown ideal weight.).    Liver Function Tests  Lab Results   Component Value Date    ALT 43 04/06/2017    AST 28 04/06/2017    ALBUMIN 4.2 04/06/2017    ALKPHOS 117 04/06/2017     Acute Phase Reactants  Lab Results   Component Value Date    ESR 12 01/31/2017    CRP 0.8 01/31/2017     Rheumatologic Labs    Lab Results   Component Value Date    RF POSITIVE (A) 01/31/2017   Rheumatid Factor (01/2017): 1:2    Lab Results   Component Value Date    CCPAB < 0.5 01/31/2017     Other Labs  Lab Results   Component Value Date    HGBA1C 5.2 06/30/2008     Lab Results   Component Value Date    TSHSC 1.380 02/07/2016     VITAMIN D,25 HYDROXY (ng/mL)   Date Value   01/31/2017 23 (L)     Lab Results   Component Value Date    CKTOTAL 95 06/16/2008     Lab Results   Component Value Date    LDL 129 10/08/2013     HEPATITIS B SURFACE ANTIGEN (no units)   Date Value   04/04/2006 NON-REACTIVE     HEPATITIS B SURFACE ANTIBODY (no units)   Date Value   04/04/2006 NEGATIVE     HEPATITIS C ANTIBODY (no units)   Date Value   04/04/2006 NEGATIVE     Imaging reviewed personally by me today:  Left Hand Xray (February 2019):  Left hand x-ray consistent with osteoarthritis.     Chest Xray (11/2016):  No pneumothorax or pneumonia.     Right Ankle Xray (01/2015):  Lateral ankle soft tissue swelling. No fracture.     Bilateral Knee Xray (03/2013):  1. Tiny marginal osteophytes along the right medial tibiofemoral   compartment. 2. Normal alignment and preservation of the cartilage spaces.     Right Hand Xray (02/2008):  Unremarkable right hand radiographs.    Lumbar Xray (10/2007):  Normal alignment of the vertebral bodies. Vertebral body heights are well  maintained. An peritumoral disc spaces are normal. No acute fracture,  subluxation or dislocation. No evidence of facet disease. Evidence  of   coarse interarticularis defect. SI joints are normal.    Assessment: Trenise Turay is a 52 year old [place] speaking female with severe esophagitis, allergy to NSAIDs here today with resolution of right lateral epicondylitis and carpal tunnel syndromes on the right side; primary complaint today is left CMC pain likely secondary to osteoarthritis and bilateral knee pain due to osteoarthritis.    Plan/Recommendations:  1.  Bilateral knee pain due to arthritis.  Recommended that the patient start physical therapy for quadriceps strengthening.  If she does not improve with conservative management would consider cortical steroid injection 6 to 8 weeks after physical therapy.  Advised to apply Voltaren gel which she may use 1 g up to 4 times a day on both  knees.    2.  Left CMC pain due to osteoarthritis.  Patient was given a spica splint.  She was offered an intra-articular CMC injection.  Please see detailed below.    Procedure note     Date: 12/11/2017    Ms. Chyenne Sobczak is a 52 year old female who is here for the following procedure.     Procedure: Left CMC cortical steroid injection.     Indication: Pain, osteoarthritis.     I explained the risks (bleeding, infection, pain, steroid atrophy, tendon rupture, hypopigmentation of the skin) and benefits to the patient and Daneisha Surges understood and gave verbal consent to proceed. After time-out was performed, the area over the left [facility] was prepped in a sterile manner using chlorhexidene swab. Topical ethyl chloride was used as anesthetic.     Using a 5/8  inch 25 gauge needle, the left CMC was injected with 0.1 cc of 1% lidocaine and 1 cc (20 mg) of methylprednisolone. There were no complications. The patient has been told to ice and rest the left hand for the next few days and to call if there is increased erythema, swelling, pain, or fevers.      3.  Right wrist pain and right lateral epicondylitis have resolved.    4.   History of NSAID allergy with what sounds like angioedema.  I did advise the patient that she should avoid oral NSAIDs given previous history of allergic reaction.    HEALTH MAINTENANCE: N/A  EDUCATION:  The total face to face time with patient was 25   minutes.  Greater than 50% of that time was spent on counseling/educating patient regarding potential etiologies, pathophysiology, treatment options, need for further work-up including blood work, imaging and referrals, reviewing previous lab results and radiology findings, reviewing internal and external medical records, and coordinating medical care, outside of the time needed to perform any procedures.      REMINDERS FOR NEXT VISIT: N/A    I discussed the likely diagnosis(es),  prognosis(es), and the various treatment options in detail with the patient, including observation.     Risks and benefits of the treatment plan(s) were discussed.     The patient's questions have been addressed and answered.     We discussed importance of medication compliance.     The patient was ready to learn and no apparent learning barriers were identified.     Follow-up in 3 months, earlier if needed.    Deborah [name] verbally expressed an understanding and agreement with the plan as outlined above.    This dictation was made using voice recognition software.  There is potential for transcription errors and incorrect word usage based on limitations of the software.      Makenli Derstine Adolf-Ubokudom, MD, Julious Payer.     Bilateral knee x-rays (12/11/2017): Consistent with osteoarthritis.  Letter sent to patient.

## 2017-12-11 NOTE — Patient Instructions (Addendum)
Thumb splints may help to decrease pain by immobilizing the joint.  There are a variety of different splints, some of which are hard and others which are soft.   They can be small or longer to support the wrist, too.  You can obtain a splint from a local pharmacy, a medical supply store or an Research scientist (medical) such as [employer].  I do not recommend any particular brand.          [name]

## 2017-12-11 NOTE — Progress Notes (Signed)
Left thumb wrist brace applied to left hand by Jerlyn Ly RN under the direction of Dr. Margaretmary Dys.    CSM positive after DME applied.    Instructions given to patient on proper care, removal and application of DME.    Call [facility] with any questions or concerns.

## 2017-12-17 ENCOUNTER — Ambulatory Visit
Payer: No Typology Code available for payment source | Attending: Physical Medicine & Rehabilitation | Admitting: Rehabilitative and Restorative Service Providers"

## 2017-12-17 DIAGNOSIS — M7711 Lateral epicondylitis, right elbow: Secondary | ICD-10-CM | POA: Diagnosis present

## 2017-12-17 NOTE — Progress Notes (Signed)
12/17/17 1500   Language Information   Language of Care English   Interpreter No   Rehab Discipline   Rehab Discipline PT   Visit   Visit number 6   POC Due date d/c    Time Calculation   Start Time 0300   Stop Time 0325   Time Calculation (min) 25 min   Pain   Pain Score 0    Manual Therapy   Technique IASTM to R wrist extensiors    Manual Therapy 2   Technique gentle radial glide proximal grade 2-3    Manual Therapy 3   Technique d/c asessment    Ther Exercise 2   Exercise Wrist extensor stretch   Ther Exercise 3   Exercise 3 review of HEP- see previous note for therex    Patient Education   What was taught? cont HEP    Method Practice;Demo;Verbal   Patient comprehension Yes     Estill Cotta, Leonardo, Lic # [id number]

## 2017-12-24 ENCOUNTER — Other Ambulatory Visit (HOSPITAL_BASED_OUTPATIENT_CLINIC_OR_DEPARTMENT_OTHER): Payer: Self-pay | Admitting: Internal Medicine

## 2017-12-24 NOTE — Progress Notes (Signed)
PER Pharmacy, Deborah [name] is a 52 year old female has requested a refill of Toprol XL 100 mg.      Last Office Visit: 12/04/17 with Risser, T  Last Physical Exam: 03/13/17    FECAL OCCULT BLOOD AGE 13+ due on 03/21/2018    Other Med Adult:  Most Recent BP Reading(s)  12/11/17 : 113/80        Cholesterol (mg/dL)   Date Value   10/08/2013 192     LOW DENSITY LIPOPROTEIN DIRECT (mg/dL)   Date Value   10/08/2013 129     HIGH DENSITY LIPOPROTEIN (mg/dL)   Date Value   10/08/2013 48     TRIGLYCERIDES (mg/dL)   Date Value   10/08/2013 223 (H)         THYROID SCREEN TSH REFLEX FT4 (uIU/mL)   Date Value   02/07/2016 1.380         TSH (THYROID STIM HORMONE) (uIU/mL)   Date Value   12/22/2010 1.31       HEMOGLOBIN A1C (%)   Date Value   06/30/2008 5.2       No results found for: POCA1C      INR (no units)   Date Value   06/15/2008 1.0 (L)       SODIUM (mmol/L)   Date Value   12/04/2017 143       POTASSIUM (mmol/L)   Date Value   12/04/2017 3.9           CREATININE (mg/dL)   Date Value   12/04/2017 1.0       Documented patient preferred pharmacies:    [place], [place] [facility] [place].  Phone: [phone] Fax: [phone]

## 2018-01-22 ENCOUNTER — Other Ambulatory Visit (HOSPITAL_BASED_OUTPATIENT_CLINIC_OR_DEPARTMENT_OTHER): Payer: Self-pay | Admitting: Internal Medicine

## 2018-01-22 DIAGNOSIS — E559 Vitamin D deficiency, unspecified: Secondary | ICD-10-CM

## 2018-01-22 NOTE — Progress Notes (Signed)
PER Pharmacy, Deborah [name] is a 52 year old female has requested a refill of vitamin D.      [name] Office Visit: 06/29/2017 with Deborah [name]  [name] Physical Exam: 03/13/2017    FECAL OCCULT BLOOD AGE 80+ due on 03/21/2018    Other Med Adult:  Most Recent BP Reading(s)  12/11/17 : 113/80        Cholesterol (mg/dL)   Date Value   10/08/2013 192     LOW DENSITY LIPOPROTEIN DIRECT (mg/dL)   Date Value   10/08/2013 129     HIGH DENSITY LIPOPROTEIN (mg/dL)   Date Value   10/08/2013 48     TRIGLYCERIDES (mg/dL)   Date Value   10/08/2013 223 (H)         THYROID SCREEN TSH REFLEX FT4 (uIU/mL)   Date Value   02/07/2016 1.380         TSH (THYROID STIM HORMONE) (uIU/mL)   Date Value   12/22/2010 1.31       HEMOGLOBIN A1C (%)   Date Value   06/30/2008 5.2       No results found for: POCA1C      INR (no units)   Date Value   06/15/2008 1.0 (L)       SODIUM (mmol/L)   Date Value   12/04/2017 143       POTASSIUM (mmol/L)   Date Value   12/04/2017 3.9           CREATININE (mg/dL)   Date Value   12/04/2017 1.0       Documented patient preferred pharmacies:    [place], [place] [facility] [place].  Phone: [phone] Fax: [phone]

## 2018-01-23 ENCOUNTER — Ambulatory Visit (HOSPITAL_BASED_OUTPATIENT_CLINIC_OR_DEPARTMENT_OTHER): Payer: No Typology Code available for payment source

## 2018-01-23 DIAGNOSIS — M7711 Lateral epicondylitis, right elbow: Secondary | ICD-10-CM | POA: Diagnosis present

## 2018-01-23 DIAGNOSIS — M25562 Pain in left knee: Secondary | ICD-10-CM | POA: Diagnosis present

## 2018-01-23 DIAGNOSIS — M25561 Pain in right knee: Secondary | ICD-10-CM | POA: Diagnosis present

## 2018-01-23 DIAGNOSIS — G8929 Other chronic pain: Secondary | ICD-10-CM | POA: Diagnosis present

## 2018-01-23 NOTE — Progress Notes (Signed)
OUTPATIENT PHYSICAL THERAPY EVALUATION    Referring Physician: Joya Martyr Adolf-Ubokudom, MD    Subjective: Pt is a 52 y.o. F w/B knee pain x 1-2 yrs., worse in the past 3 mos., pain is @anterior  and medial knee, worse on R, denies acute trauma.  Pt has been taking Tylenol and Diclofenac gel for temporary relief.  Endorses occasional buckling on R w/turning, h/o LBP.      Date of Onset: 10/23/17  Pain:     At worse: 8-9/10   At best: 0/10  Pre-morbid Functional Level: I w/all ADL  Functional Deficits: pain w/climbing stairs, pain w/sit <> stand trsf and w/object retrieval from floor, requires support to stand up.    Occupation: housecleaner  Dressing/Grooming: pain w/donning pants  Driving: no pain  Sleeping: occasionally w/difficulty sleeping.      Contraindications/Precautions: N/A    Patient Active Problem List:     Lump or mass in breast     Gastroesophageal reflux disease without esophagitis     Other specified gastritis     Abdominal pain, epigastric     Angioneurotic edema not elsewhere classified     Plantar Fasciitis, L     Leg pain     Tobacco abuse, in remission     Triggering of Finger, R 3rd     Right subacromial bursitis/rotator cuff tendinopathy     Chondromalacia patellae     Lower back pain     De Quervain's Tenosynovitis, R     CTS (Carpal Tunnel Syndrome), b/l     Family history of diabetes mellitus (DM)     Hypolipoproteinemia     Intermittent spinal claudication (HCC)     Vitamin D deficiency     Greater Trochanteric Bursitis, b/l     DDD (degenerative disc disease), lumbar     Obesity     Rapid Noctural Palpitations x 10 Seconds     Sweating     Microscopic hematuria     Reflux esophagitis     Lump of skin of lower extremity     Bilateral knee pain     Bilateral arm pain     Positive occult stool blood test     Chronic right-sided low back pain with right-sided sciatica     SVT To 240 BPM March 2010. As Of 2017, Episodes q 2 Weeks, Usually Subside One Minute, Occ 15 Minutes.  Commonly Relieved With  Vagal Maneuvers.     BMI 33.0-33.9,adult     Rapid Palpitations Only Once/Month, Lasting Seconds, Abolished by Valsalva, Aug 2018.      Polyarthralgia     Macular chorioretinal scar of left eye     Hyperopia of both eyes with astigmatism and presbyopia     Osteoarthritis of both knees     Lateral epicondylitis of right elbow     Trigger middle finger of right hand     Osteoarthritis of carpometacarpal (CMC) joint of left thumb     Right shoulder injury      Current Outpatient Medications on File Prior to Visit:  cholecalciferol (VITAMIN D3) 2000 UNIT tablet Take 1 tablet by mouth daily Disp: 30 tablet Rfl: 11   metoprolol (TOPROL-XL) 100 MG 24 hr tablet Take 1 tablet by mouth daily Disp: 30 tablet Rfl: 11   diclofenac (VOLTAREN) 1 % GEL Gel Apply to both hands, both elbows and both knees up to 4 times a day. Do Not Exceed 32 g in 24 Hours Disp: 100 g Rfl: 3  Calcium 600-200 MG-UNIT per tablet Take 1 tablet by mouth 2 (two) times daily Disp: 60 tablet Rfl: 11   omeprazole (PRILOSEC) 20 MG capsule Take 1 capsule by mouth 2 (two) times daily before meals Disp: 60 capsule Rfl: 11     No current facility-administered medications on file prior to visit.       Mental Status/Communication: No barriers.  Learns Best: Verbal, Demo.  Primary Language: [place]; Requires Interpreter: No.     Objective:     Pt arrived 2' late.    Observation:  IR of femurs, nml arch height B'ly, increased WB through LLE, increased m bulk @L  thigh.    Gait:  RLE in ER, minimal trunk rotation, decreased velocity.    Stairs:  Steps x 2 w/1 HR, recip gait, w/pain reproduced w/flxn + WB on RLE.      A/PROM:  B Hips = WNL t/o.      Knees:  L = WNL t/o w/end-range pain w/passive extension.  R = WNL      Ankles:  DF:  R = 0/4.  L = 6/8.    PF:  B = WNL t/o.      MMT:  Ankles:  PF:  R = 3 heel raises.  L = 5 heel raises.    DF:  B = 4-/5.    Hips:  Flxn.:  R = 3+/5 w/anterior thigh pain.  L = 4-/5.    ABD.:  B = 3+/5.    Knees:  Ext.:  B =  4-/5.    Flxn.:  R = 4/5.  L = 4-/5.      Palpation:  (+)TTP @R  medial jt line.      M. Length:  Decreased @B  H/s mm.  Decreased @R  gastroc/soleus mm.      Special tests, PFJ testing, TBA d/t pt c/o LBP w/laying supine.      Physical Therapy Plan of Care    MD: Helane Gunther, APRN  Referring Provider: Joya Martyr Adolf-Ubokudom, MD  Diagnosis: B knee pain    Assessment/Objective Findings:   Patient is a 52 year old female with complaints of pain in her bilateral knees, R > L, worse in the past 3 mos.  She presents to PT w/decreased m strength, tightness of B H/s and R gastroc/soleus mm., w/pt's symptoms reproduced w/stair negotiation, namely w/loading the knee in a flexed position.  Physical exam limited by pt lateness and decreased tolerance w/laying supine d/t LBP.  These findings are c/w B knee OA, resulting in impaired ability to perform trsf., negotiate stairs, and to don pants.  She may benefit from PT to enhance ease w/ADL.        Short Term Goals: 3 weeks  1. Pt will be I w/HEP to maintain progress.  2. Pt will decrease pain level to 6-7/10 @worst  to promote ability to don pants.  3. Pt will demo nml m length of R gastroc/soleus mm to enhance ability to negotiate stairs.    Long Term Goals: 7 weeks  1. Pt will decrease pain level to 3-4/10 @worst  to enhance ability to perform trsf.  2. Pt will increase m strength by 1 grade to promote ability to don pants.  3. Pt will negotiate 1 FOS w/1 HR w/recip gait, w/no greater than 3/10 pain.      Treatment Plan:  ** PT Eval - Low Complexity (CPT [id number])  ** Stretching/ROM/Therapeutic Exercise [phone])  ** Home Exercise Program/ Patient Education (CPT [phone])  **  Manual Therapy / Joint / Soft tissue Mobilization (CPT 97140)    Recommend skilled physical therapy services for 1 times per week for 7 weeks. Updated plan of care will be completed every 30 days.  Pt unable to attend recommended 2x/week.    The rehabilitation potential for this patient is good. Clinical  presentation is stable.    Patient Regis Bill is aware of attendance policy: Yes  Plan of care discussed with Patient/Family: Yes  Patient goals reviewed and incorporated in plan of care: Yes  Patient/Family agrees with plan of care: Yes  Patient/Family education: Yes  Does patient feel safe at home: Yes    Carollee Sires, [place], Lic # [id number]

## 2018-01-24 NOTE — Progress Notes (Signed)
I certify that the documented Treatment Plan is reasonable and necessary.    01/24/2018  Consetta Cosner L Adolf-Ubokudom, MD

## 2018-01-28 ENCOUNTER — Ambulatory Visit (HOSPITAL_BASED_OUTPATIENT_CLINIC_OR_DEPARTMENT_OTHER): Payer: No Typology Code available for payment source | Admitting: Physical Therapy

## 2018-01-28 DIAGNOSIS — M25561 Pain in right knee: Secondary | ICD-10-CM | POA: Diagnosis present

## 2018-01-28 DIAGNOSIS — M7711 Lateral epicondylitis, right elbow: Secondary | ICD-10-CM | POA: Diagnosis not present

## 2018-01-28 DIAGNOSIS — G8929 Other chronic pain: Secondary | ICD-10-CM

## 2018-01-28 DIAGNOSIS — M25562 Pain in left knee: Secondary | ICD-10-CM | POA: Diagnosis present

## 2018-01-28 NOTE — Progress Notes (Signed)
01/28/18 1500   Language Information   Language of Care English   Interpreter No   Precautions   Precautions Yes   [facility]   Visit   Visit number 2   POC Due date 02/23/18   Time Calculation   Start Time 1535   Stop Time 1600   Time Calculation (min) 25 min   Pain   Pain Score 0    Ther Exercise   Exercise bike x 4 min    Ther Exercise 2   Exercise SLR    Reps 2 5   Sets 2 3   Holds 2 B   Ther Exercise 3   Exercise 3 bridging    Reps 3 10   Sets 3 1   Holds 3 3  sec    Ther Exercise 4   Exercise 4 S/L clam shell    Reps 4 10   Sets 4 1   Holds 4 B   Ther Exercise 5   Exercise 5 S/L hip abd    Reps 5 10   Sets 5 1   Holds 5 B   Ther Exercise 6   Exercise 6 HS stretch with strap    Holds 6 20 sec x 2 ea    Ther Exercise 7   Exercise 7 gastro stretch at wall    Holds 7 20 sec x 2 ea    Patient Education   What was taught? cont with HEP   Method Verbal   Patient comprehension Yes     Foye Deer, PTA, Lic # [id number]

## 2018-01-28 NOTE — Progress Notes (Signed)
S: Pt report 2 days ago she get up form the bed and she was having constant  post knee pain on B knee. Pain level prior session 0/10.   O: Refer to Rehabilitation Treatment Flowsheet  A: Pt Rx focused on hip/Knee strengthening exercises and stretching exercises to increase strength and increase  ROM on B knee. 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 Noted a visible shakiness on R quad muscles during SLR. Pt tolerated all exercises well without increasing of pain except with clam shell.  Instructed and dispensed a written HEP. Pt verbalized understanding.   P: Cont with PT to increase strength on B LE.   Foye Deer, PTA, Lic # [id number]

## 2018-02-06 ENCOUNTER — Ambulatory Visit: Payer: No Typology Code available for payment source | Attending: Physical Medicine & Rehabilitation

## 2018-02-06 DIAGNOSIS — M7711 Lateral epicondylitis, right elbow: Secondary | ICD-10-CM | POA: Diagnosis not present

## 2018-02-06 DIAGNOSIS — G8929 Other chronic pain: Secondary | ICD-10-CM | POA: Diagnosis present

## 2018-02-06 DIAGNOSIS — M25561 Pain in right knee: Secondary | ICD-10-CM

## 2018-02-06 DIAGNOSIS — M25562 Pain in left knee: Secondary | ICD-10-CM | POA: Diagnosis present

## 2018-02-08 NOTE — Progress Notes (Signed)
02/06/18 1500   Precautions   Precautions Yes   Rehab Discipline   [facility]   Visit   Visit number 3   POC Due date 02/23/18   Time Calculation   Start Time 0330   Stop Time 0400   Time Calculation (min) 30 min   Pain   Pain Score 0    Ther Exercise   Exercise leg press w/3 plates   Reps 15   Sets 2   Ther Exercise 2   Exercise step-downs off 2" step w/RLE    Reps 2 10   Sets 2 3   Ther Exercise 3   Exercise 3 sidestepping w/green TB   Holds 3 2 laps   Ther Exercise 4   Exercise 4 S/L clam shell    Reps 4 10   Sets 4 1   Holds 4 B   Ther Exercise 5   Exercise 5 S/L hip abd    Reps 5 10   Sets 5 1   Holds 5 B   Patient Education   What was taught? cont with HEP   Method Verbal   Patient comprehension Yes     S: Pt says that she doesn't have any pain today, endorses discomfort w/stair descent.    O: see flowsheet.    A: Pt w/good response to there ex, no c/o during visit, though fatigues easily w/sidestepping due to weakness in B glute med.  She reports consistent compliance w/HEP.    P: Continue w/step-downs to improve eccentric quad ct'l.    Carollee Sires, [place], [place] # [id number]

## 2018-02-13 ENCOUNTER — Ambulatory Visit (HOSPITAL_BASED_OUTPATIENT_CLINIC_OR_DEPARTMENT_OTHER): Payer: No Typology Code available for payment source

## 2018-02-13 DIAGNOSIS — G8929 Other chronic pain: Secondary | ICD-10-CM | POA: Insufficient documentation

## 2018-02-13 DIAGNOSIS — M25562 Pain in left knee: Secondary | ICD-10-CM | POA: Diagnosis present

## 2018-02-13 DIAGNOSIS — M7711 Lateral epicondylitis, right elbow: Secondary | ICD-10-CM | POA: Diagnosis not present

## 2018-02-13 DIAGNOSIS — M25561 Pain in right knee: Secondary | ICD-10-CM | POA: Insufficient documentation

## 2018-02-14 NOTE — Progress Notes (Signed)
02/13/18 1700   Precautions   Precautions Yes   [place] [facility]   Visit   Visit number 4   POC Due date 02/23/18   Time Calculation   Start Time 0500   Stop Time 0530   Time Calculation (min) 30 min   Pain   Pain Score 8    Ther Exercise   Exercise B hip ABD in S/L   Reps 10   Sets 3   Holds ea.   Ther Exercise 2   Exercise SLR   Reps 2 10   Sets 2 3   Holds 2 B   Ther Exercise 3   Exercise 3 marching w/50# in FT   Reps 3 15   Sets 3 3   Holds 3 2 laps   Ther Exercise 4   Exercise 4 review of HEP   Ther Exercise 5   Exercise 5 recumbent bike   Holds 5 L4, 10'.   Patient Education   What was taught? cont with HEP   Method Verbal   Patient comprehension Yes        02/13/18 1700   Precautions   Precautions Yes   [place] [facility]   Visit   Visit number 4   POC Due date 02/23/18   Time Calculation   Start Time 0500   Stop Time 0530   Time Calculation (min) 30 min   Pain   Pain Score 8    Ther Exercise   Exercise B hip ABD in S/L   Reps 10   Sets 3   Holds ea.   Ther Exercise 2   Exercise SLR   Reps 2 10   Sets 2 3   Holds 2 B   Ther Exercise 3   Exercise 3 marching w/50# in FT   Reps 3 15   Sets 3 3   Holds 3 2 laps   Ther Exercise 4   Exercise 4 review of HEP   Ther Exercise 5   Exercise 5 recumbent bike   Holds 5 L4, 10'.   Patient Education   What was taught? cont with HEP   Method Verbal   Patient comprehension Yes     S: Pt says that she felt LBP x 3 days after last visit, endorses pain in R medial knee today    O: see flowsheet.    A: Pt advised to increase frequency of HEP exs., from 2 to 3 sets, no c/o during visit today.  She reports good compliance w/HEP, no adverse effects observed post-tx.    P: Continue w/balance exs @next  visit.    Carollee Sires, [place], [place] # [id number]

## 2018-02-19 ENCOUNTER — Ambulatory Visit (HOSPITAL_BASED_OUTPATIENT_CLINIC_OR_DEPARTMENT_OTHER): Payer: No Typology Code available for payment source

## 2018-02-25 ENCOUNTER — Ambulatory Visit: Payer: No Typology Code available for payment source | Attending: Physical Medicine & Rehabilitation

## 2018-02-25 DIAGNOSIS — M25561 Pain in right knee: Secondary | ICD-10-CM

## 2018-02-25 DIAGNOSIS — M25562 Pain in left knee: Secondary | ICD-10-CM

## 2018-02-25 DIAGNOSIS — G8929 Other chronic pain: Secondary | ICD-10-CM

## 2018-02-26 DIAGNOSIS — M25562 Pain in left knee: Secondary | ICD-10-CM | POA: Diagnosis present

## 2018-02-26 DIAGNOSIS — G8929 Other chronic pain: Secondary | ICD-10-CM | POA: Diagnosis present

## 2018-02-26 DIAGNOSIS — M7711 Lateral epicondylitis, right elbow: Secondary | ICD-10-CM | POA: Diagnosis not present

## 2018-02-26 DIAGNOSIS — M25561 Pain in right knee: Secondary | ICD-10-CM | POA: Diagnosis present

## 2018-02-26 NOTE — Progress Notes (Addendum)
02/25/18 1500   Precautions   Precautions Yes   [place] [facility]   Visit   Visit number 5   POC Due date 02/23/18   Time Calculation   Start Time 1500   Stop Time 1530   Time Calculation (min) 30 min   Pain   Pain Score 3    Ther Exercise   Exercise B hip ABD in S/L   Reps 10   Sets 3   Holds ea.   Ther Exercise 2   Exercise A/P step-ups onto 4" step w/foam   Reps 2 15   Sets 2 3   Holds 2 B   Ther Exercise 3   Exercise 3 step-downs off 2" step w/BLE's   Reps 3 15   Sets 3 3   Holds 3 B   Ther Exercise 4   Exercise 4 B SLS on foam   Reps 4 3   Holds 4 30 sec.   Ther Exercise 5   Exercise 5 BUE elevations w/feet together on foam   Reps 5 15   Sets 5 3   Patient Education   What was taught? cont with HEP   Method Verbal   Patient comprehension Yes     S: Pt says that she still experiences pain w/stair negotiation and squatting down to pick up objects.    O: see flowsheet.    A: Pt w/good eccentric ct'l w/step-downs B'ly, w/mild unsteadiness observed w/balance exs on airex.  She is progressing well overall, advised pt to avoid SLR d/t c/o LBP.  Attempted squats but held due to pt c/o anterior knee pain.    P: Continue w/balance exs @next  session.      Carollee Sires, [place], [place] # [id number]

## 2018-03-11 ENCOUNTER — Ambulatory Visit (HOSPITAL_BASED_OUTPATIENT_CLINIC_OR_DEPARTMENT_OTHER): Payer: No Typology Code available for payment source | Admitting: Physical Therapy

## 2018-03-11 DIAGNOSIS — M7711 Lateral epicondylitis, right elbow: Secondary | ICD-10-CM | POA: Diagnosis not present

## 2018-03-11 DIAGNOSIS — M25562 Pain in left knee: Secondary | ICD-10-CM | POA: Diagnosis present

## 2018-03-11 DIAGNOSIS — G8929 Other chronic pain: Secondary | ICD-10-CM

## 2018-03-11 DIAGNOSIS — M25561 Pain in right knee: Secondary | ICD-10-CM | POA: Insufficient documentation

## 2018-03-11 NOTE — Progress Notes (Signed)
S: Pt report B knee pain prior session 3/10.   O: Refer to Rehabilitation Treatment Flowsheet  A: Pt require min verbal cues to improve form and to avoid LB pain with lateral walking with R. Also require verbal cues and demonstration to improve form and to avoid R knee pain during step down 2 inches. Pt cont present challenge with SLS and wobble boad due to weakness on B LE.    P: Cont with hip/knee strengthening exercises and balances exercises.       Foye Deer, PTA, Lic # [id number]

## 2018-03-11 NOTE — Progress Notes (Signed)
03/11/18 1700   Language Information   Language of Care Portuguese   Interpreter Patient Declined   Precautions   Precautions Yes   Rehab Discipline   Rehab Disc[facility] PT   Visit   Visit number 6   POC Due date 02/23/18   Time Calculation   Start Time 1700   Stop Time 1730   Time Calculation (min) 30 min   Pain   Pain Score 3    Ther Exercise   Exercise B hip ABD in S/L   Reps 10   Sets 3   Holds ea.   Ther Exercise 2   Exercise step up 6 inches    Reps 2 15   Sets 2 2   Holds 2 B   Ther Exercise 3   Exercise 3 step down 2 inches    Reps 3 15   Sets 3 2   Holds 3 B   Ther Exercise 4   Exercise 4 SLS on foam  (B)   Reps 4 3   Holds 4 30 sec.   Ther Exercise 5   Exercise 5 lateral walk with RTB above knee  20 ft x 2    Ther Exercise 6   Exercise 6 bike x 5 min    Ther Exercise 7   Holds 7 10-15 sec x 3 ea    Exercise 7 wobble board L/M   Patient Education   What was taught? cont with HEP   Method Verbal   Patient comprehension Yes     Foye Deer, PTA, Lic # [id number]

## 2018-03-12 ENCOUNTER — Ambulatory Visit (HOSPITAL_BASED_OUTPATIENT_CLINIC_OR_DEPARTMENT_OTHER): Payer: No Typology Code available for payment source | Admitting: Internal Medicine

## 2018-03-12 ENCOUNTER — Encounter (HOSPITAL_BASED_OUTPATIENT_CLINIC_OR_DEPARTMENT_OTHER): Payer: Self-pay | Admitting: Internal Medicine

## 2018-03-12 ENCOUNTER — Ambulatory Visit
Admission: RE | Admit: 2018-03-12 | Discharge: 2018-03-12 | Disposition: A | Discharge status: Home / Self Care | Payer: No Typology Code available for payment source | Source: Ambulatory Visit | Attending: Internal Medicine | Admitting: Internal Medicine

## 2018-03-12 VITALS — BP 113/69 | HR 81 | Temp 98.4°F | Resp 16 | Ht 61.5 in | Wt 187.0 lb

## 2018-03-12 DIAGNOSIS — M47817 Spondylosis without myelopathy or radiculopathy, lumbosacral region: Secondary | ICD-10-CM | POA: Insufficient documentation

## 2018-03-12 DIAGNOSIS — M7711 Lateral epicondylitis, right elbow: Secondary | ICD-10-CM | POA: Insufficient documentation

## 2018-03-12 DIAGNOSIS — G8929 Other chronic pain: Secondary | ICD-10-CM | POA: Diagnosis present

## 2018-03-12 DIAGNOSIS — M545 Low back pain: Secondary | ICD-10-CM

## 2018-03-12 DIAGNOSIS — M170 Bilateral primary osteoarthritis of knee: Secondary | ICD-10-CM | POA: Insufficient documentation

## 2018-03-12 DIAGNOSIS — M1812 Unilateral primary osteoarthritis of first carpometacarpal joint, left hand: Secondary | ICD-10-CM

## 2018-03-12 DIAGNOSIS — G5601 Carpal tunnel syndrome, right upper limb: Secondary | ICD-10-CM | POA: Insufficient documentation

## 2018-03-12 DIAGNOSIS — M549 Dorsalgia, unspecified: Secondary | ICD-10-CM | POA: Diagnosis present

## 2018-03-12 DIAGNOSIS — M5135 Other intervertebral disc degeneration, thoracolumbar region: Secondary | ICD-10-CM | POA: Diagnosis present

## 2018-03-12 NOTE — Progress Notes (Signed)
[facility]  Rheumatology Follow-Up Patient Note    Date of Visit: 03/12/2018    Patient: Deborah [name]    PCP: Helane Gunther, APRN    Primary Language: [place] [name]  Interpreter: [phone], Pamala Hurry.     Reason for Visit:  Osteoarthritis of hands, trigger finger, and elbow pain.  Last seen December 11, 2017.      History of present illness:  Deborah [name] who is a 52 year old [place] and [place]- speaking female with bilateral knee osteoarthritis, nonalcoholic steatohepatitis, history of H. pylori gastritis in 2008 treatment, history of severe esophagitis seen on most recent EGD 2014, former smoker.    She was seen during initial consultation June 06, 2017 for evaluation of inflammatory arthritis in the setting of polyarthralgia and low titer rheumatoid factor of 1-2.  At the end of that encounter, polyarthralgia was deemed to be due to mechanical causes including bilateral knee osteoarthritis, right shoulder pain due to osteoarthritis versus rotator cuff tendinopathy; right lateral epicondylitis, CMC osteoarthritis and right hand third digit stenosing tenosynovitis.  During this initial visit, she received corticosteroid injection to the right subacromial bursa as well as the right third palmar aspect of the MCP of the hand for stenosing tenosynovitis.  Given her history of esophagitis and severe allergy to NSAIDs we recommended topical Voltaren gel.    She presents today in follow-up.    She has not had recurrence of triggering in her hands.  Does note intermittent CMC pain which improves with Voltaren gel use.  Overall hand pain is much improved.    Notes minimal knee pain with improvement after physical therapy.  She continues to do home exercise program.    Epicondylitis is "80% better."  Applies Voltaren gel to the elbow.  Next    Today she notes that she has back pain which started approximately 4 days ago.  Denies any trauma injury  or fall.  States that she has had flares of lower back pain for the last several years.  Back pain is not associated with any radicular features.  Does note that she has overflow incontinence which is not a new problem but rather seems to be more worse when she is at work as she "is always in a rush."  Denies any falling, tripping or slipping.  Denies any malodorous urine, frequency or urgency.    Rheumatologic Problem List:  1.  Bilateral knee osteoarthritis.  2.  Right shoulder pain likely due to osteoarthritis versus rotator cuff tendinitis versus bursitis.  3.  Right lateral epicondylitis.  4.  Hand osteoarthritis, CMC osteoarthritis.  5.  Right third digit of the hand stenosing tenosynovitis.  6.  Low titer rheumatoid factor 1:2.  7.  NSAID allergy.    Past medical history:  Patient Active Problem List:     Lump or mass in breast     Gastroesophageal reflux disease without esophagitis     Other specified gastritis     Abdominal pain, epigastric     Angioneurotic edema not elsewhere classified     Plantar Fasciitis, L     Leg pain     Tobacco abuse, in remission     Triggering of Finger, R 3rd     Right subacromial bursitis/rotator cuff tendinopathy     Chondromalacia patellae     Lower back pain     De Quervain's Tenosynovitis, R     CTS (Carpal Tunnel Syndrome), b/l     Family  history of diabetes mellitus (DM)     Hypolipoproteinemia     Intermittent spinal claudication (HCC)     Vitamin D deficiency     Greater Trochanteric Bursitis, b/l     DDD (degenerative disc disease), lumbar     Obesity     Rapid Noctural Palpitations x 10 Seconds     Sweating     Microscopic hematuria     Reflux esophagitis     Lump of skin of lower extremity     Bilateral chronic knee pain     Bilateral arm pain     Positive occult stool blood test     Chronic right-sided low back pain with right-sided sciatica     SVT To 240 BPM March 2010. As Of 2017, Episodes q 2 Weeks, Usually Subside One Minute, Occ 15 Minutes.  Commonly Relieved  With Vagal Maneuvers.     BMI 33.0-33.9,adult     Rapid Palpitations Only Once/Month, Lasting Seconds, Abolished by Valsalva, Aug 2018.      Polyarthralgia     Macular chorioretinal scar of left eye     Hyperopia of both eyes with astigmatism and presbyopia     Osteoarthritis of both knees     Lateral epicondylitis of right elbow     Trigger middle finger of right hand     Osteoarthritis of carpometacarpal (CMC) joint of left thumb     Right shoulder injury    Past surgical history:  Past Surgical History:  No date: CESAREAN DELIVERY ONLY      Comment:  breech  No date: Graham BREAST SPECIMEN      Comment:  s/p fibroadenoma on the L,   No date: SEPTOPLASTY/SUBMUCOUS RESECJ W/WO CARTILAGE GRF    Allergies:  Review of Patient's Allergies indicates:   Naproxen                Swelling    Comment:Ed visit with angioedema   Ibuprofen               Other (See Comments)    Comment:Epigastric pain, throat swelling?             05/06/12 pt states uses sometimes without issue   Pollen extract-tree*    Runny Nose    Comment:HA, itchy watery eyes    Medications:    Current Outpatient Medications on File Prior to Visit:  cholecalciferol (VITAMIN D3) 2000 UNIT tablet Take 1 tablet by mouth daily Disp: 30 tablet Rfl: 11   metoprolol (TOPROL-XL) 100 MG 24 hr tablet Take 1 tablet by mouth daily Disp: 30 tablet Rfl: 11   diclofenac (VOLTAREN) 1 % GEL Gel Apply to both hands, both elbows and both knees up to 4 times a day. Do Not Exceed 32 g in 24 Hours Disp: 100 g Rfl: 3   omeprazole (PRILOSEC) 20 MG capsule Take 1 capsule by mouth 2 (two) times daily before meals Disp: 60 capsule Rfl: 11     No current facility-administered medications on file prior to visit.     Health Maintenance:  Last eye exam- 04/2017  Last dental exam- 5 months ago.   Last colonoscopy- NEVER  Last bone density- NEVER  PPD or QuantiFERON TB Gold- negative, but unsure of date    PEG SCORE (CHRONIC PAIN) due on 03/15/1982  INFLUENZA VACCINE(1) due on 12/09/2017  AWQ  Questionnaire due on 01/31/2018  HEALTH CARE PROXY due on 02/25/2018  FECAL OCCULT BLOOD AGE 71+ due on 03/21/2018  LIPID  SCREENING due on 10/09/2018  MAMMOGRAPHY due on 02/14/2019  PAP SMEAR due on 06/30/2019  HPV SCREENING due on 06/30/2019  TETANUS VACCINE(2 - Td) due on 03/08/2026  HIV SCREENING Completed  HEP C SCREEN (DOB [birth year]) Completed  PHYSICAL EXAM Completed  PNEUMOCOCCAL VACCINE SERIES (< 65) Completed; No procedure found.    ROS:  Review of Systems: Constitutional, Eyes, ENT/Mouth, Cardiovascular, Respiratory, GI, GU, Neuro, Psych, Heme/Lymph, Skin, Musculoskeletal, and Endocrine systems were reviewed and are NEGATIVE, except for what is documented in the note.    PE:   03/12/18  1520   BP: 113/69   Pulse: 81   Resp: 16   Temp: 98.4 F (36.9 C)   SpO2: 98%   Weight: 84.8 kg (187 lb)   Height: 5' 1.5" (1.562 m)     Body mass index is 34.76 kg/m.  GENERAL: NAD. Normocephalic/Atraumatic. Alert and oriented to person, place, and time. Good eye contact. Full affect. Follows commands appropriately.  Well-groomed.    HEENT: Pupils were equal, round and reactive. Extraocular motions are intact. Mucous membranes are moist. There is no scalp rash. No malar rash. Anicteric sclerae. Non-injected conjunctivae. No nystagmus. Oropharynx clear. Tongue is midline. Symmetric palate raise.  No ulcerations. No exudate. No erythema.  Tympanic membranes are clear.  No parotid enlargement.   NECK: Supple. No appreciable cervical lymphadenopathy.  LUNGS: Clear to auscultation without wheezes, rhonchi, rales, or crackles.  CV: S1/S2 normal, without murmurs, gallops, or rubs.  ABDOMEN:  Bowel sounds in all four quadrants, soft, nontender, nondistended. No appreciable organomegaly.  EXTREMITIES: Warm and well-perfused without any sign of cyanosis, clubbing, or edema.  SKIN: No rashes, nodules, bruising, hyperpigmentation, hypopigmentation, telangiectasias, periungal erythema, splinter hemorrhages, sclerodermatous changes,  icterus, petechiae, palpable purpura, tethering, striae, keloid, nail abnormalities or tophi.    MUSCULOSKELETAL:   Hypertrophy of PIPs noted.  No triggering of fingers noted.  Grip strength is normal bilaterally.  Squaring of the left CMC noted.  No swelling in the hands.  No swelling in the wrists.  Unable to provoke feelings of pain or Tinel's.  No swelling in elbows. No swelling in the shoulders.  No restriction in range of motion in fingers, wrists, elbows, or shoulders.   No tender joints in the fingers, wrists, elbows or shoulders.  Negative empty can sign bilaterally.  No pain over the sternoclavicular joint, AC joint or TMJs.   No restriction in ROM of the cervical spine.  No focal back tenderness.  Negative straight leg raise.  Negative Faber's.  Negative Schober's.  Negative logroll.  No restriction in range of motion in hips, knees, or ankles.   No swelling in the knees or ankles.   Positive patellar grind in the right knee.  Bilateral valgus deformity of the knees with hypertrophic changes and crepitations.  No Achille's swelling or tenderness.  There is no tenderness on palpation of the MTPs.    No dactylitis or splaying of the toes.  NEURO: Cranial nerves II-XII intact. Patellar reflexes, Achilles reflexes and brachioradialis reflexes are 2/2 and symmetric. Narrow-based gait.  Power is 5/5 in all four limbs and without weakness. No difficulty getting up from seated position without using hands or out of a supine position.  No assistive ambulatory devices.    LABORATORY DATA:  Blood Counts  Lab Results   Component Value Date    WBC 10.4 04/06/2017    HGB 13.6 04/06/2017    HCT 40.8 04/06/2017    MCV 87.6 04/06/2017  PLTA 208 04/06/2017      Chemistries  Lab Results   Component Value Date    CA 8.9 12/04/2017    NA 143 12/04/2017    K 3.9 12/04/2017    CO2 28 12/04/2017    CL 107 12/04/2017    BUN 13 12/04/2017    CREAT 1.0 12/04/2017     CrCl cannot be calculated (Patient's most recent lab result is  older than the maximum 7 days allowed.).    Liver Function Tests  Lab Results   Component Value Date    ALT 43 04/06/2017    AST 28 04/06/2017    ALBUMIN 4.2 04/06/2017    ALKPHOS 117 04/06/2017     Acute Phase Reactants  Lab Results   Component Value Date    ESR 12 01/31/2017    CRP 0.8 01/31/2017     Rheumatologic Labs    Lab Results   Component Value Date    RF POSITIVE (A) 01/31/2017   Rheumatid Factor (01/2017): 1:2    Lab Results   Component Value Date    CCPAB < 0.5 01/31/2017     Other Labs  Lab Results   Component Value Date    HGBA1C 5.2 06/30/2008     Lab Results   Component Value Date    TSHSC 1.380 02/07/2016     VITAMIN D,25 HYDROXY (ng/mL)   Date Value   01/31/2017 23 (L)     Lab Results   Component Value Date    CKTOTAL 95 06/16/2008     Lab Results   Component Value Date    LDL 129 10/08/2013     HEPATITIS B SURFACE ANTIGEN (no units)   Date Value   04/04/2006 NON-REACTIVE     HEPATITIS B SURFACE ANTIBODY (no units)   Date Value   04/04/2006 NEGATIVE     HEPATITIS C ANTIBODY (no units)   Date Value   04/04/2006 NEGATIVE     Imaging reviewed personally by me today:    Bilateral knee x-rays (12/11/2017): Consistent with osteoarthritis.      Left Hand Xray (February 2019):  Left hand x-ray consistent with osteoarthritis.     Chest Xray (11/2016):  No pneumothorax or pneumonia.     Right Ankle Xray (01/2015):  Lateral ankle soft tissue swelling. No fracture.     Bilateral Knee Xray (03/2013):  1. Tiny marginal osteophytes along the right medial tibiofemoral   compartment. 2. Normal alignment and preservation of the cartilage spaces.     Right Hand Xray (02/2008):  Unremarkable right hand radiographs.    Lumbar Xray (10/2007):  Normal alignment of the vertebral bodies. Vertebral body heights are well  maintained. An peritumoral disc spaces are normal. No acute fracture,  subluxation or dislocation. No evidence of facet disease. Evidence of   coarse interarticularis defect. SI joints are  normal.    Assessment: Gagandeep Kossman is a 52 year old [place] speaking female with severe esophagitis, allergy to NSAID, CMC osteoarthritis, hand also arthritis, bilateral knee osteoarthritis, chronic lower back pain likely degenerative disc disease.    Plan/Recommendations:  1.  Bilateral knee pain, osteoarthritis.  Continue physical therapy for quadriceps strengthening.  Advised to apply Voltaren gel which she may use 1 g up to 4 times a day on both knees.  Will hold on intraarticular injection for know.    2.  Left CMC pain due to osteoarthritis.  Continue use of voltaren gel.     3.  Chronic  Lower back pain. Likely mechanical or degenerative.  Low suspicion for cord compression.  Referred to physical therapy. Lumbar xray ordered.   If not improved will consider MRI, and physiatry referral at the next visit.     4.  History of NSAID allergy with what sounds like angioedema.  I did advise the patient that she should avoid oral NSAIDs given previous history of allergic reaction.    HEALTH MAINTENANCE: N/A  EDUCATION:  The total face to face time with patient was 15 minutes.  Greater than 50% of that time was spent on counseling/educating patient regarding potential etiologies, pathophysiology, treatment options, need for further work-up including blood work, imaging and referrals, reviewing previous lab results and radiology findings, reviewing internal and external medical records, and coordinating medical care, outside of the time needed to perform any procedures.      REMINDERS FOR NEXT VISIT: N/A    I discussed the likely diagnosis(es),  prognosis(es), and the various treatment options in detail with the patient, including observation.     Risks and benefits of the treatment plan(s) were discussed.     The patient's questions have been addressed and answered.     We discussed importance of medication compliance.     The patient was ready to learn and no apparent learning barriers were  identified.     Follow-up in 6 months, earlier if needed.    Deborah [name] verbally expressed an understanding and agreement with the plan as outlined above.    This dictation was made using voice recognition software.  There is potential for transcription errors and incorrect word usage based on limitations of the software.      Wendy Mikles Adolf-Ubokudom, MD, Julious Payer.

## 2018-03-13 ENCOUNTER — Encounter (HOSPITAL_BASED_OUTPATIENT_CLINIC_OR_DEPARTMENT_OTHER): Payer: Self-pay | Admitting: Internal Medicine

## 2018-03-13 ENCOUNTER — Ambulatory Visit (HOSPITAL_BASED_OUTPATIENT_CLINIC_OR_DEPARTMENT_OTHER): Payer: No Typology Code available for payment source | Admitting: Physical Therapy

## 2018-03-18 ENCOUNTER — Ambulatory Visit (HOSPITAL_BASED_OUTPATIENT_CLINIC_OR_DEPARTMENT_OTHER): Payer: No Typology Code available for payment source

## 2018-03-18 DIAGNOSIS — M25562 Pain in left knee: Principal | ICD-10-CM

## 2018-03-18 DIAGNOSIS — G8929 Other chronic pain: Secondary | ICD-10-CM

## 2018-03-18 DIAGNOSIS — M25561 Pain in right knee: Principal | ICD-10-CM

## 2018-03-21 DIAGNOSIS — G8929 Other chronic pain: Secondary | ICD-10-CM | POA: Diagnosis present

## 2018-03-21 DIAGNOSIS — M25561 Pain in right knee: Secondary | ICD-10-CM | POA: Diagnosis present

## 2018-03-21 DIAGNOSIS — M7711 Lateral epicondylitis, right elbow: Secondary | ICD-10-CM | POA: Diagnosis present

## 2018-03-21 DIAGNOSIS — M25562 Pain in left knee: Secondary | ICD-10-CM | POA: Diagnosis present

## 2018-03-21 NOTE — Progress Notes (Signed)
03/18/18 1500   Precautions   Precautions No   Rehab Discipline   Rehab Discipline PT   Visit   Visit number 5   POC Due date 02/23/18   Time Calculation   Start Time 1530   Stop Time 1600   Time Calculation (min) 30 min   Pain   Pain Score 4    Ther Exercise   Exercise B hip ABD in S/L   Reps 10   Sets 3   Holds ea.   Ther Exercise 2   Exercise SLR   Reps 2 10   Sets 2 3   Holds 2 B   Ther Exercise 3   Exercise 3 LAQ's w/7.5#   Reps 3 10   Sets 3 3   Ther Exercise 4   Exercise 4 stretching of B quad m in prone w/strap   Reps 4 3   Holds 4 30 sec.   Ther Exercise 5   Exercise 5 standing gastroc/soleus strech off wedge   Reps 5 3   Holds 5 30 sec., B   Patient Education   What was taught? cont with HEP   Method Verbal   Patient comprehension Yes     S: Pt says that she feels generally stiff today, possibly due to cold, inclement weather.    O: see flowsheet.    A/P: Pt w/increased stiffness and discomfort today, fatiguing quicker than usual w/ther ex.  She appears to be plateauing w/regards to pain level and fct'l ability, w/improved eccentric quad ct'l noted w/step-downs off higher step today.  Will D/C pt @next  visit and she will undergo an eval for LBP.      Carollee Sires, [place], [place] # [id number]

## 2018-03-25 ENCOUNTER — Ambulatory Visit: Payer: No Typology Code available for payment source | Attending: Physical Medicine & Rehabilitation

## 2018-03-25 DIAGNOSIS — G8929 Other chronic pain: Secondary | ICD-10-CM

## 2018-03-25 DIAGNOSIS — M7711 Lateral epicondylitis, right elbow: Secondary | ICD-10-CM | POA: Diagnosis present

## 2018-03-25 DIAGNOSIS — M25561 Pain in right knee: Secondary | ICD-10-CM | POA: Diagnosis present

## 2018-03-25 DIAGNOSIS — M25562 Pain in left knee: Secondary | ICD-10-CM | POA: Diagnosis present

## 2018-03-26 NOTE — Progress Notes (Signed)
Discharge Summary:    Pt was D/C'ed today, having achieved max benefit from PT.  She demo decreased pain level overall, and has met all STG's and LTG's.  Pt demo some weakness of hips that can addressed w/HEP.  She will not benefit from further PT at this time.      Objective:    MMT:  Knees:  R = 4/5 t/o.  L = 4+/5 t/o.    Hips:  flxn.:  R = 4-/5 w/groin discomfort.  L = 4-/5.    ABD.:  R = 3+/5.  L = 4-/5.      Today's Tx:     03/25/18 1500   Precautions   Precautions No   Rehab Discipline   Rehab Discipline PT   Visit   Visit number 6   POC Due date 02/23/18   Time Calculation   Start Time 1530   Stop Time 1600   Time Calculation (min) 30 min   Pain   Pain Score 0    Ther Exercise   Exercise B hip ABD in S/L   Reps 10   Sets 3   Holds ea.   Ther Exercise 2   Exercise standing unilateral calf raises   Reps 2 10   Sets 2 2   Holds 2 B   Ther Exercise 3   Exercise 3 standing gastroc/soleus stretch off wedge   Sets 3 3   Holds 3 35 sec., B   Ther Exercise 4   Exercise 4 unilateral bridging   Reps 4 10   Sets 4 2   Holds 4 B   Patient Education   What was taught? proper posture w/ther ex   Method Verbal;Demo;Practice   Patient comprehension Yes     Carollee Sires, [place], Lic # [id number]

## 2018-04-22 ENCOUNTER — Ambulatory Visit: Payer: No Typology Code available for payment source | Attending: Family Medicine | Admitting: Family Medicine

## 2018-04-22 ENCOUNTER — Ambulatory Visit (HOSPITAL_BASED_OUTPATIENT_CLINIC_OR_DEPARTMENT_OTHER): Payer: No Typology Code available for payment source | Admitting: Rehabilitative and Restorative Service Providers"

## 2018-04-22 ENCOUNTER — Encounter (HOSPITAL_BASED_OUTPATIENT_CLINIC_OR_DEPARTMENT_OTHER): Payer: Self-pay | Admitting: Family Medicine

## 2018-04-22 VITALS — BP 115/77 | HR 62 | Temp 97.1°F | Ht 61.5 in | Wt 182.0 lb

## 2018-04-22 DIAGNOSIS — M25561 Pain in right knee: Secondary | ICD-10-CM | POA: Diagnosis present

## 2018-04-22 DIAGNOSIS — G8929 Other chronic pain: Secondary | ICD-10-CM | POA: Diagnosis present

## 2018-04-22 DIAGNOSIS — R32 Unspecified urinary incontinence: Secondary | ICD-10-CM | POA: Insufficient documentation

## 2018-04-22 DIAGNOSIS — N399 Disorder of urinary system, unspecified: Secondary | ICD-10-CM | POA: Insufficient documentation

## 2018-04-22 DIAGNOSIS — Z1211 Encounter for screening for malignant neoplasm of colon: Secondary | ICD-10-CM | POA: Diagnosis present

## 2018-04-22 DIAGNOSIS — Z23 Encounter for immunization: Secondary | ICD-10-CM | POA: Insufficient documentation

## 2018-04-22 DIAGNOSIS — M7711 Lateral epicondylitis, right elbow: Secondary | ICD-10-CM | POA: Diagnosis not present

## 2018-04-22 DIAGNOSIS — M25562 Pain in left knee: Secondary | ICD-10-CM | POA: Insufficient documentation

## 2018-04-22 DIAGNOSIS — Z789 Other specified health status: Secondary | ICD-10-CM | POA: Diagnosis present

## 2018-04-22 DIAGNOSIS — M5440 Lumbago with sciatica, unspecified side: Secondary | ICD-10-CM | POA: Diagnosis present

## 2018-04-22 DIAGNOSIS — M545 Low back pain: Secondary | ICD-10-CM | POA: Diagnosis present

## 2018-04-22 LAB — URINALYSIS
BILIRUBIN, URINE: NEGATIVE
CASTS: NONE SEEN PER LPF
CRYSTALS: NONE SEEN
GLUCOSE, URINE: NEGATIVE MG/DL
KETONE, URINE: NEGATIVE MG/DL
LEUKOCYTE ESTERASE: NEGATIVE
NITRITE, URINE: NEGATIVE
PH URINE: 5.5 (ref 5.0–8.0)
PROTEIN, URINE: NEGATIVE MG/DL
SPECIFIC GRAVITY URINE: 1.015 (ref 1.003–1.035)
SQUAMOUS EPITHELIAL CELLS: >10 PER LPF — AB (ref 0–4)

## 2018-04-22 LAB — POC URINALYSIS
BILIRUBIN, URINE: NEGATIVE
GLUCOSE,URINE: NEGATIVE
KETONE, URINE: NEGATIVE
LEUKOCYTE ESTERASE: NEGATIVE
NITRITE, URINE: NEGATIVE
PH URINE: 5.5 (ref 5.0–8.0)
PROTEIN, URINE: NEGATIVE
SPECIFIC GRAVITY, URINE: 1.015 (ref 1.003–1.030)
UROBILINOGEN URINE: 0.2 (ref 0.2–1.0)

## 2018-04-22 NOTE — Progress Notes (Signed)
zz Influenza Vaccine Procedure  April 22, 2018    1. Has the patient received the information for the influenza vaccine? yes    2. Does the patient have any of the following contraindications?  Allergy to eggs? No  Allergic reaction to previous influenza vaccines? No  Any other problems to previous influenza vaccines? No  Paralyzed by Guillain-Barre syndrome?  No  Currently pregnant? No  Current moderate or severe illness? No  Allergy to contact lens solution? No    3. The vaccine has been administered in the usual fashion and the patient/guardian was instructed to wait 20 minutes before leaving the building in the event of an allergic reaction:     Immunization information and current VIS for flu vaccine(s) reviewed; verbal consent given by patient/guardian.    Imagene Sheller, RN, 04/22/2018 9:21 AM

## 2018-04-22 NOTE — Progress Notes (Signed)
PT Treatment Flowsheet     04/22/18 1500   Language Information   Language of Care Portuguese   Interpreter Patient Declined   Weight Bearing Status   RLE FWB   LLE FWB   Rehab Discipline   Rehab Discipline PT   Visit   Visit number 1   POC Due date 05/24/18   Time Calculation   Start Time 1500   Stop Time 1545   Time Calculation (min) 45 min   Pain   Pain Score 4    Ther Exercise   Exercise HEP: supine and sitting pelvic tilts, cat camel   Patient Education   What was taught? PT POC; HEP as above   Method Verbal;Demo;Written   Patient comprehension Yes     Egbert Garibaldi, PT, DPT Lic # [id number]

## 2018-04-22 NOTE — Progress Notes (Signed)
Pt formerly of Dr Glean Salen; first visit w/ me  Review of Patient's Allergies indicates:   Naproxen                Swelling    Comment:Ed visit with angioedema   Ibuprofen               Other (See Comments)    Comment:Epigastric pain, throat swelling?             05/06/12 pt states uses sometimes without issue   Pollen extract-tree*    Runny Nose    Comment:HA, itchy watery eyes    Current Outpatient Medications   Medication Sig    cholecalciferol (VITAMIN D3) 2000 UNIT tablet Take 1 tablet by mouth daily    metoprolol (TOPROL-XL) 100 MG 24 hr tablet Take 1 tablet by mouth daily    omeprazole (PRILOSEC) 20 MG capsule Take 1 capsule by mouth 2 (two) times daily before meals     No current facility-administered medications for this visit.      Patient Active Problem List:     Lump or mass in breast     Gastroesophageal reflux disease without esophagitis     Other specified gastritis     Abdominal pain, epigastric     Angioneurotic edema not elsewhere classified     Plantar Fasciitis, L     Leg pain     Tobacco abuse, in remission     Triggering of Finger, R 3rd     Right subacromial bursitis/rotator cuff tendinopathy     Chondromalacia patellae     Lower back pain     De Quervain's Tenosynovitis, R     CTS (Carpal Tunnel Syndrome), b/l     Family history of diabetes mellitus (DM)     Hypolipoproteinemia     Intermittent spinal claudication (HCC)     Vitamin D deficiency     Greater Trochanteric Bursitis, b/l     DDD (degenerative disc disease), lumbar     Obesity     Rapid Noctural Palpitations x 10 Seconds     Sweating     Microscopic hematuria     Reflux esophagitis     Lump of skin of lower extremity     Bilateral chronic knee pain     Bilateral arm pain     Positive occult stool blood test     Chronic right-sided low back pain with right-sided sciatica     SVT To 240 BPM March 2010. As Of 2017, Episodes q 2 Weeks, Usually Subside One Minute, Occ 15 Minutes.  Commonly Relieved With Vagal  Maneuvers.     BMI 33.0-33.9,adult     Rapid Palpitations Only Once/Month, Lasting Seconds, Abolished by Valsalva, Aug 2018.      Polyarthralgia     Macular chorioretinal scar of left eye     Hyperopia of both eyes with astigmatism and presbyopia     Osteoarthritis of both knees     Lateral epicondylitis of right elbow     Trigger middle finger of right hand     Osteoarthritis of carpometacarpal (CMC) joint of left thumb     Right shoulder injury    Pt reports 3 weeks ago had an episode of urinary incontinence; during work  Had an urge to urinate but could not control this  No fever  No hematuria  No n/v/d/c  Menopause 13 yr ago  No bowel incontinence; no leg weakness  No smoking  No postmenopausal bleeding  O;GEN:well looking middle aged woman, NAD, afeb  SKIN: nl  TLZ:[id number] BS x 4 Qs, soft,no masses, n/t, except mild SPT, no CVAT, no rebound  GU: ur dip:tr  Lysed blood only  20 min face to face; > 50% time counseling, coordinating care    (R32) Urinary incontinence, unspecified type  (primary encounter diagnosis)  Comment: doubt, r/o UTI  Suspect postmenopausal  Plan:d dx disc'd fully   URINE CULTURE, URINALYSIS, REFERRAL TO UROLOGY         ( INT)       If +, will treat  Suspect will be neg; enc'd to keep uro appt  RTC w/ me p uro and sooner PRN concerns, unimproved, worse, worry sx disc'd  Pt stated she understood and agreed to do.      (N39.9) Urinary system disease  Comment: r/o UTI  Plan: URINE DIPSTICK            (Z12.11) Special screening for malignant neoplasms, colon  Comment:  Plan: POC IMMUNOASSAY FECAL OCCULT BLOOD TEST            (Z23) Need for prophylactic vaccination and inoculation against influenza  Comment: Risk, benefits, side effects, + consent obtained    Plan: IMMUNIZATION ADMIN SINGLE, IIV4 VACC PRESERV         FREE AGE 35 MONTHS AND OLDER, 0.5ML, IM

## 2018-04-22 NOTE — Progress Notes (Signed)
PT Evaluation    HPI: Pt is a 53 year old female who presents to PT with intermittent midline low back pain which can radiate down B posterior legs to the mid calf, which originally began many years ago with worsening and increased frequency of symptoms in the past 2 months. Pt reports also feeling ongoing weakness and fatigue in her legs. Pt reports when acute pain is present it typically last for about 3 days limiting her ability to move.     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   Alleviating factors: leaning forward (lumbar flex), hot shower, voltaren gel, tylenol, rest at the end of the day.     ADLs: independent, can be difficult.  Sleep: can be disturbed due to pain on occasion.   IADLs: increased pain turning when standing to cook or clean.  Stairs: independent without pain.   Driving: occasional pain with prolonged sitting, can be difficult getting out of car after long ride home after work.     Occupation: full-time Electrical engineer, unable to vacuum due to pain.   Recreation: none    Pt previously underwent PT x 6 visits for chronic B knee pain and reports continuing HEP 2-3x/wk.      04/22/18 1500   Language Information   Language of Care Portuguese   Interpreter Patient Declined   Evaluation Type   Evaluation Type Initial Evaluation   Rehab Discipline   Rehab Discipline PT   Visit   Visit number 1   POC Due date 05/24/18   Pain   Pain Score 4    Weight Bearing Status   RLE FWB   LLE FWB   Patient Stated Goals   Patient stated goals decrease low back pain   Posture   Posterior Pelvic Tilt  Minimal   Posture assessment X   Spine Assessment   Spine Assessment Lumbar   Lumbar Assessment   Flexion 100%  (pain/pulling down posterior B LE)   Extension 50%  (pain midline low back)   L Rotation 50%   R Rotation 75%   L Side Bend 100%   R Side Bend 100%   PROM LLE (degrees)   Overall PROM full L hip ROM without pain    Strength LLE   Overall Strength limited L hip extension: 4-/5, hip abduction 4-/5, hip flexion 4/5   PROM RLE (degrees)   Overall PROM full R hip ROM without pain   Strength RLE   Overall Strength limited R hip extension: 4-/5, hip abduction 4-/5, hip flexion 4/5   Clinical Special Tests   Special Tests Yes   Lumbar/Sacroiliac results   L Slump Negative   R Slump Positive   L SLR  Negative   R SLR  Positive   Repetitive Flexion Positive  (pain peripherlizing down B calves)   Repetitive Extension Positive  (localized low back pain)   L Faber Negative   R Faber Negative   L Active SLR Negative   R Active SLR  Negative   Functional Mobility   Gait non-antalgic with posterior pelvic tilt, limited B terminal hip extension   Transfers independent   Palpation   Tenderness to Palpation TTP R>L PSIS, R lumbar paraspinals   Other core stabilizer strength: 4-/5   Spine Joint Mobility (0-6 scale)   Spine Mobility Assessment  Yes   Lumbar   Passive Accessory Intervertebral Movement (PAIVM) 2/6  (pain L4-5)  Neurological   Neurological assessment  Yes   Myotomes   RLE Strength WNL   LLE  Strength WNL   Reflexes   RLE Normal reflexes   LLE Normal reflexes   Patient Education   What was taught? PT POC; HEP as above; ice/heat for pain control prn; continue previous HEP   Method Verbal;Demo;Written   Patient comprehension Yes     Physical Therapy Plan of Care     FT:[id number] Curtin, APRN  Referring Provider: Joya Martyr Adolf-Ubokudom, MD  Diagnosis: Chronic midline low back pain with sciatica, sciatica laterality unspecified  (primary encounter diagnosis)    Assessment/Objective Findings:   Patient is a 53 year old female who presents to PT with intermittent midline low back pain which can radiate down B posterior legs to the mid calves which originally began many years ago with increasing intensity/frequency of symptoms in the past few months. 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 Pt presenting to PT with limited lumbar AROM with peripheralization of R>L mid calf symptoms into flexion, localized low back pain into extension. Positive R Slump and SLR special tests. Equal and strong B LE myotomes and DTRs. Pt demonstrates significant core stabilizer as well as B hip extensor and abductor weakness. Limited lumbar p-a joint mobility with pain at L4-5, pain provoked at R>L PSIS and R lumbar paraspinals. Pt is currently limited in her ability to perform cleaning duties, especially vacuuming or any activity involving turning/rotating due to low back pain, which affects her ability to work full-time as a Electrical engineer. Pt will benefit from skilled PT to address impairments and educate pt on appropriate HEP and self-management skills to optimize pt's ability to perform daily and occupational activities without pain or limitation. Of note, pt just finished skilled PT x 6 visits for B knee pain and continues to practice independent HEP 2-3x/wk for B LE strengthening, which she is encouraged to continue. The prescribed treatment plan of care is medically necessary.    Co-morbidities of GERD, leg pain intermittent with fatigue and subj weakness, R 3rd trigger finger, CTS, B greater troch bursitis, BMI 33-33.9, polyarthralgia, other specified gastritis, epigastric abdominal pain, angioneurotic edema, L plantarfasciitis, h/o tobacco abuse, h/o R subacromial bursitis/RTC tendinopathy, chrondromalacia patellae, low back pain, R dequervain's tenosynovitis, lumbar DDD, obesity, rapid nocturnal pelpications, sweating, reflux esophagitis, chronic B knee pain, B arm pain, R lateral epicondylitis, OA of L thumb CMC joint were identified and taken into considerations of plan of care.    Short Term Functional Goals: 4 weeks.   1. Pt will be compliant with daily HEP.   2. Pt will be able to perform pelvic tilts in supine, sitting and qped (if tolerable position) with  proper lumbopelvic mobility and motor control.   3. Pt will be able to engage and hold TrA contractions 10 sec x 10 reps in supine, sitting and qped (if tolerable position).   4. Pt will be able to perform wall ball or deloaded squat with proper hip hinge x 10 reps.   5. Pt will demonstrate independent log-roll technique for bed mobility.     Long Term Goal: 6 weeks.   1. Pt will be independent with HEP.   2. Pt will have increased B hip extensor and abductor strength by 1/3 MMT grade.   3. Pt will have increased core stabilizer strength by 1/3 MMT grade.  4. Pt will demonstrate independent body mechanics for typical cleaning activities, including vacuuming.   5. Pt will demonstrate independent squat lifting up to 15# with proper body mechanics.     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  ** Stretching/ROM/Therapeutic Exercise [phone])  ** Home Exercise Program/ Patient Education (CPT [phone])  ** Neuromuscular Re-education (CPT [phone])  ** Manual Therapy / Joint / Soft tissue Mobilization (CPT 97140)  ** Hot/Cold Rx (CPT 97010)  ** Functional Activities (CPT 97530)    Recommend Physical Therapy be continued 2 times per week for 6 weeks.  The rehabilitation potential for this patient is fair    Patient Regis Bill is aware of attendance policy: Yes  Plan of care discussed with Patient/Family: Yes  Patient goals reviewed and incorporated in plan of care: Yes  Patient/Family agrees with plan of care: Yes  Patient/Family education: Yes  Does patient feel safe at home: Yes    Egbert Garibaldi, PT, DPT Lic # [id number]

## 2018-04-22 NOTE — Addendum Note (Signed)
Addended by: Midge Aver on: 04/22/2018 02:16 PM     Modules accepted: Orders, SmartSet

## 2018-04-22 NOTE — Progress Notes (Signed)
I certify that the documented Treatment Plan is reasonable and necessary.    04/22/2018  Raygen Dahm L Adolf-Ubokudom, MD

## 2018-04-23 LAB — URINE CULTURE

## 2018-04-24 ENCOUNTER — Telehealth (HOSPITAL_BASED_OUTPATIENT_CLINIC_OR_DEPARTMENT_OTHER): Payer: Self-pay | Admitting: Family Medicine

## 2018-04-24 NOTE — Progress Notes (Signed)
Due to the language barrier, the phone call was conducted in [place] with an interpreter. The interpreter's name is  Iris ID [id number] .     The interpreter was on the phone during the  phone call.      No voicemail available.        Hassan Rowan, 04/24/2018 3:59 PM                       Helane Gunther, APRN  P Ec Rn Pool            Pls let her know no official UTI (vag contaminant); enc to keep appt w/ urology (I don't see it booked yet).   Thanks!   Helane Gunther, APRN, 04/24/2018

## 2018-04-25 NOTE — Progress Notes (Signed)
Spoke with pt   Informed of provider's message    Pt stated already has appt with Urologist scheduled in March which she plans to keep    To call back with any questions/concerns    Pearla Dubonnet, RN, 04/25/2018

## 2018-05-01 ENCOUNTER — Ambulatory Visit (HOSPITAL_BASED_OUTPATIENT_CLINIC_OR_DEPARTMENT_OTHER): Payer: No Typology Code available for payment source | Admitting: Rehabilitative and Restorative Service Providers"

## 2018-05-01 DIAGNOSIS — M7711 Lateral epicondylitis, right elbow: Secondary | ICD-10-CM | POA: Diagnosis not present

## 2018-05-01 DIAGNOSIS — M545 Low back pain: Secondary | ICD-10-CM | POA: Diagnosis present

## 2018-05-01 DIAGNOSIS — M5440 Lumbago with sciatica, unspecified side: Secondary | ICD-10-CM | POA: Diagnosis present

## 2018-05-01 DIAGNOSIS — M25562 Pain in left knee: Secondary | ICD-10-CM | POA: Diagnosis present

## 2018-05-01 DIAGNOSIS — M25561 Pain in right knee: Secondary | ICD-10-CM | POA: Diagnosis present

## 2018-05-01 DIAGNOSIS — G8929 Other chronic pain: Secondary | ICD-10-CM | POA: Diagnosis present

## 2018-05-01 NOTE — Progress Notes (Signed)
S:  Pt reports L hip pain and being pain free in her LLE and LB.  O: See flow sheet  A:  Progressed core strengthening in h/l, with cueing for TA bracing.  Pt notes decreased hip pain with LAD and 4 ped LE /.  Pt demonstrates understanding of all exercises.  P: Progress strengthening per pt tolerance.  Add wall squats next visit.     05/01/18 1600   Language Information   Language of Care Portuguese   Interpreter Patient Declined   Weight Bearing Status   RLE FWB   LLE FWB   Rehab Discipline   Rehab Discipline PT   Visit   Visit number 2   POC Due date 05/24/18   Time Calculation   Start Time 1500   Stop Time 1530   Time Calculation (min) 30 min   Pain   Pain Score 3    Manual Therapy   Manual Therapy Yes   Technique LLE LAD   Time in minutes 4'   Ther Exercise   Exercise nu step  level 3 5'   Ther Exercise 2   Exercise h/l TA bracing   Reps 2 10   Sets 2 2   Holds 2 5"   Ther Exercise 3   Exercise 3 h/l marching   Reps 3 20   Sets 3 2   Ther Exercise 4   Exercise 4 h/l hip abd with RTB   Reps 4 10   Sets 4 2   Ther Exercise 5   Exercise 5 h/l alt knee fall out   Reps 5 10   Sets 5 2   Ther Exercise 6   Exercise 6 4 ped LE /   Reps 6 10   Sets 6 2   Ther Exercise 7   Exercise 7 child's pose  x20"   Patient Education   What was taught? HEP   Method Verbal;Demo;Practice;Written   Patient comprehension Yes     Talmadge Chad, PTA, Lic # [id number]

## 2018-05-06 ENCOUNTER — Ambulatory Visit (HOSPITAL_BASED_OUTPATIENT_CLINIC_OR_DEPARTMENT_OTHER): Payer: No Typology Code available for payment source | Admitting: Rehabilitative and Restorative Service Providers"

## 2018-05-06 DIAGNOSIS — M25561 Pain in right knee: Secondary | ICD-10-CM

## 2018-05-06 DIAGNOSIS — M545 Low back pain: Secondary | ICD-10-CM | POA: Diagnosis present

## 2018-05-06 DIAGNOSIS — M5440 Lumbago with sciatica, unspecified side: Secondary | ICD-10-CM | POA: Diagnosis present

## 2018-05-06 DIAGNOSIS — G8929 Other chronic pain: Secondary | ICD-10-CM | POA: Diagnosis present

## 2018-05-06 DIAGNOSIS — M7711 Lateral epicondylitis, right elbow: Secondary | ICD-10-CM | POA: Diagnosis present

## 2018-05-06 DIAGNOSIS — M25562 Pain in left knee: Secondary | ICD-10-CM | POA: Diagnosis present

## 2018-05-06 NOTE — Progress Notes (Signed)
S:  Pt reports compliance with HEP and feels the exercises are helping, and the pain has moved from the L to R side of her LB.  O: See flow sheet  A:  Pt notes decreased pain with h/l traction with belt.  Focused on core stability with tactile cueing in 4 ped to decrease hip compensation with LE extension.  Educated pt to keep fist in neutral during 4 ped to decrease wrist pain.  Added mini wall squats with SB with good form and no increase in pain.  P: Progress strengthening per pt tolerance.       05/06/18 1600   Language Information   Language of Care Portuguese   Interpreter Patient Declined   Weight Bearing Status   RLE FWB   LLE FWB   Rehab Discipline   Rehab Discipline PT   Visit   Visit number 3   POC Due date 05/24/18   Time Calculation   Start Time 1600   Stop Time 1640   Time Calculation (min) 40 min   Pain   Pain Score 3    Manual Therapy   Manual Therapy Yes   Technique h/l lumbar traction   Time in minutes 5'   Ther Exercise   Exercise nu step  level 3 5'   Ther Exercise 2   Exercise h/l PPT 10x with 5" hold   Ther Exercise 3   Exercise 3 4 ped TA bracing, 10x with 5" hold   Ther Exercise 4   Exercise 4 cat camel, 10x   Ther Exercise 5   Exercise 5 4 ped LE/, 2 x 10   Ther Exercise 6   Exercise 6 wall squats with SB, 2 x 10   Ther Exercise 7   Exercise 7 child's pose  x20"   Modalities   Type of modalities Hot pack   Hot pack   Joints Lumbar Spine   Position Prone   Time in minutes 10'   Parameters post treat   Patient Education   What was taught? continue HEP   Method Verbal   Patient comprehension Yes     Talmadge Chad, PTA, Lic # [id number]

## 2018-05-08 ENCOUNTER — Ambulatory Visit
Payer: No Typology Code available for payment source | Attending: Physical Medicine & Rehabilitation | Admitting: Rehabilitative and Restorative Service Providers"

## 2018-05-08 DIAGNOSIS — M5440 Lumbago with sciatica, unspecified side: Secondary | ICD-10-CM | POA: Diagnosis present

## 2018-05-08 DIAGNOSIS — M7711 Lateral epicondylitis, right elbow: Secondary | ICD-10-CM | POA: Diagnosis present

## 2018-05-08 DIAGNOSIS — M25561 Pain in right knee: Secondary | ICD-10-CM | POA: Diagnosis present

## 2018-05-08 DIAGNOSIS — G8929 Other chronic pain: Secondary | ICD-10-CM

## 2018-05-08 DIAGNOSIS — M545 Low back pain: Secondary | ICD-10-CM | POA: Diagnosis present

## 2018-05-08 DIAGNOSIS — M25562 Pain in left knee: Secondary | ICD-10-CM

## 2018-05-08 NOTE — Progress Notes (Signed)
S:  Pt reports compliance with HEP,  increased fatigue in her legs and is unable to give pain number.  O: See flow sheet  A:  Added piriformis stretches to program with decreased pain noted from stretches.  Continued strengthening, emphasizing pacing during activities. Pt demonstrated understanding of all exercises and performed without increased pain or weakness.  P: Progress strengthening per pt tolerance.     05/08/18 1600   Language Information   Language of Care Portuguese   Interpreter Patient Declined   Weight Bearing Status   RLE FWB   LLE FWB   Rehab Discipline   Rehab Discipline PT   Visit   Visit number 4   POC Due date 05/24/18   Time Calculation   Start Time 1530   Stop Time 1610   Time Calculation (min) 40 min   Ther Exercise   Exercise nu step  level 3 5'   Ther Exercise 2   Exercise seated PPT with 5" hold   Ther Exercise 3   Exercise 3 seated hip abd ball squeeze 2 x 10 with 5" hold   Ther Exercise 4   Exercise 4 seated piriformis strech with IR, 2 x 20B   Ther Exercise 5   Exercise 5 seated piriformis stretch with ER, 2 x 20" B   Ther Exercise 6   Exercise 6 wall squats with SB, 2 x 10   Ther Exercise 7   Exercise 7 seated hip abd with RTB  2 x 10   Ther Exercise 8   Exercise 8 magic stick to quads for self care, 20x   Ther Exercise 9   Exercise 9 seated lumbar stretch with SB, 5x with 10" hold   Modalities   Type of modalities Hot pack   Hot pack   Joints Lumbar Spine   Position Prone   Time in minutes 10'   Parameters post treat   Patient Education   What was taught? HEP   Method Verbal;Demo;Practice;Written   Patient comprehension Yes     Talmadge Chad, PTA, Lic # [id number]

## 2018-05-13 ENCOUNTER — Ambulatory Visit (HOSPITAL_BASED_OUTPATIENT_CLINIC_OR_DEPARTMENT_OTHER): Payer: No Typology Code available for payment source | Admitting: Rehabilitative and Restorative Service Providers"

## 2018-05-15 ENCOUNTER — Ambulatory Visit (HOSPITAL_BASED_OUTPATIENT_CLINIC_OR_DEPARTMENT_OTHER): Payer: No Typology Code available for payment source | Admitting: Rehabilitative and Restorative Service Providers"

## 2018-05-15 DIAGNOSIS — G8929 Other chronic pain: Secondary | ICD-10-CM | POA: Diagnosis present

## 2018-05-15 DIAGNOSIS — M7711 Lateral epicondylitis, right elbow: Secondary | ICD-10-CM | POA: Diagnosis not present

## 2018-05-15 DIAGNOSIS — M545 Low back pain: Secondary | ICD-10-CM | POA: Diagnosis present

## 2018-05-15 DIAGNOSIS — M5440 Lumbago with sciatica, unspecified side: Principal | ICD-10-CM

## 2018-05-15 DIAGNOSIS — M25562 Pain in left knee: Secondary | ICD-10-CM | POA: Diagnosis present

## 2018-05-15 DIAGNOSIS — M25561 Pain in right knee: Secondary | ICD-10-CM | POA: Diagnosis present

## 2018-05-15 NOTE — Progress Notes (Signed)
S: "My back is feeling good the last 2 days. I do the exercises every day." 0/10 pain today   O: Refer to Rehabilitation Treatment Flowsheet     05/15/18 1500   Language Information   Language of Care Portuguese   Interpreter No   Rehab Discipline   Rehab Discipline PT   Visit   Visit number 5   POC Due date 05/24/18   Time Calculation   Start Time 1530   Stop Time 1610   Time Calculation (min) 40 min   Pain   Pain Score 0    Ther Exercise   Therapeutic Exercise? Yes   Exercise nu step  level 3 5'   Ther Exercise 2   Exercise seated pball pelvic tilts   Reps 2 20   Sets 2 2   Ther Exercise 3   Exercise 3 seated pball TrA marches   Reps 3 10  (each side, alternating)   Ther Exercise 4   Exercise 4 HL self-traction with stick   Reps 4 15   Holds 4 5 sec   Ther Exercise 5   Exercise 5 supine pelvic tilts   Reps 5 20   Ther Exercise 6   Exercise 6 bridges   Reps 6 10   Sets 6 2   Hot pack   Joints Lumbar Spine   Position Prone   Time in minutes 10' post   Patient Education   What was taught? continue HEP as tolerable   Method Verbal;Demo;Practice;Written   Patient comprehension Yes   A: Pt demonstrates good lumbopelvic mobility performing pelvic tilts in supine and seated on pball. Pt with cramping in L hip flexor after performing seated TrA marches, which subsided with rest. Pt has been issued print out of updated HEP which has been scanned into Epic under Media section.   P: continue functional core and B LE strengthening as tolerable.     Egbert Garibaldi, PT,DPT  Lic # [id number]

## 2018-05-20 ENCOUNTER — Ambulatory Visit
Payer: No Typology Code available for payment source | Attending: Physical Medicine & Rehabilitation | Admitting: Rehabilitative and Restorative Service Providers"

## 2018-05-20 DIAGNOSIS — M25562 Pain in left knee: Secondary | ICD-10-CM | POA: Diagnosis present

## 2018-05-20 DIAGNOSIS — M7711 Lateral epicondylitis, right elbow: Secondary | ICD-10-CM | POA: Diagnosis not present

## 2018-05-20 DIAGNOSIS — M5440 Lumbago with sciatica, unspecified side: Secondary | ICD-10-CM | POA: Diagnosis present

## 2018-05-20 DIAGNOSIS — G8929 Other chronic pain: Secondary | ICD-10-CM

## 2018-05-20 DIAGNOSIS — M25561 Pain in right knee: Secondary | ICD-10-CM | POA: Diagnosis present

## 2018-05-20 DIAGNOSIS — M545 Low back pain: Secondary | ICD-10-CM | POA: Diagnosis present

## 2018-05-20 NOTE — Progress Notes (Signed)
S: "My back has been feeling good for the past week, no pain. When I go up and down the stairs a lot at work I feel my legs, but no pain." 0/10 pain today.   O: Refer to Rehabilitation Treatment Flowsheet   05/20/18 1700   Language Information   Language of Care Portuguese   Interpreter No   Rehab Discipline   Rehab Discipline PT   Visit   Visit number 6   POC Due date d/c   Time Calculation   Start Time 1700   Stop Time 1730   Time Calculation (min) 30 min   Pain   Pain Score 0    Ther Exercise   Therapeutic Exercise? Yes   Ther Exercise 2   Exercise seated pball pelvic tilts   Reps 2 20   Ther Exercise 7   Exercise 7 cat camel   Reps 7 20   Ther Exercise 8   Exercise 8 bird dog LE only   Reps 8 10  (each side, alternating)   Ther Exercise 9   Exercise 9 Pt ed: body mechanics vacuuming   Ther Exercise 10   Exercise 10 squat lift 5# then 15# from shin to waist height   Reps 10 5   Sets 10 2   Ther Exercise 11   Exercise 11 wall ball squats   Reps 11 10   Sets 11 2   Hot pack   Joints Lumbar Spine   Position Prone   Time in minutes 10' post   Patient Education   What was taught? continue HEP as tolerable; reviewed body mechanics for typical cleaning (vacuum) and squat lifting   Method Verbal;Demo;Practice;Written   Patient comprehension Yes   A: Pt demonstrate good lumbopelvic mobility with seated pelvic tilts and cat camel. Pt able to perform B LE bird dog with proper core stability. Pt required verbal and demo cues to improve hip hinge with squat lifting following wall ball squats to establish proper movement pattern. Pt reports increased R shoulder and B knee pain performing squat lifting - pt educated on importance of proper hip hinge to avoid knee pain. Pt has been issued print out of updated HEP which has been scanned into Epic under Media section.     P: D/C to independent HEP.     Egbert Garibaldi, PT, DPT Lic # [id number]

## 2018-05-22 NOTE — Progress Notes (Signed)
Erroneous encounter, please disregard.  Anyeli Hockenbury Best-Shearer, PTA, Lic # [id number]

## 2018-05-30 ENCOUNTER — Other Ambulatory Visit (HOSPITAL_BASED_OUTPATIENT_CLINIC_OR_DEPARTMENT_OTHER): Payer: Self-pay

## 2018-05-30 NOTE — Telephone Encounter (Signed)
Outreach CRC Screening    Pt due for ifob kit. Left message on vm. Await call back.  Antawan Mchugh, [place], 05/30/2018

## 2018-06-28 ENCOUNTER — Telehealth (HOSPITAL_BASED_OUTPATIENT_CLINIC_OR_DEPARTMENT_OTHER): Payer: Self-pay | Admitting: Urology

## 2018-06-28 ENCOUNTER — Other Ambulatory Visit: Payer: Self-pay

## 2018-06-28 NOTE — Progress Notes (Signed)
Spoke w/pt of her appt w/dr liou on 3/23 at [place] is rescheduled to [facility] on 6/16 @ 215, mailed pt an appt reminder.

## 2018-07-01 ENCOUNTER — Other Ambulatory Visit (HOSPITAL_BASED_OUTPATIENT_CLINIC_OR_DEPARTMENT_OTHER): Payer: Self-pay | Admitting: Urology

## 2018-09-09 ENCOUNTER — Ambulatory Visit: Payer: No Typology Code available for payment source | Attending: Family Medicine | Admitting: Internal Medicine

## 2018-09-09 DIAGNOSIS — K2100 Gastro-esophageal reflux disease with esophagitis, without bleeding: Secondary | ICD-10-CM

## 2018-09-09 DIAGNOSIS — S4991XA Unspecified injury of right shoulder and upper arm, initial encounter: Secondary | ICD-10-CM | POA: Diagnosis present

## 2018-09-09 DIAGNOSIS — M170 Bilateral primary osteoarthritis of knee: Secondary | ICD-10-CM | POA: Diagnosis present

## 2018-09-09 DIAGNOSIS — M65331 Trigger finger, right middle finger: Secondary | ICD-10-CM

## 2018-09-09 DIAGNOSIS — M1812 Unilateral primary osteoarthritis of first carpometacarpal joint, left hand: Secondary | ICD-10-CM | POA: Diagnosis not present

## 2018-09-09 NOTE — Televisit Note (Signed)
[facility]  Rheumatology Follow-Up Patient Note    Date of Visit: 09/09/2018    Patient: Deborah [name]    PCP: Helane Gunther, APRN    Primary Language: [place] [name]  Interpreter: Mitzi Hansen,     Reason for Visit:  Osteoarthritis of hands, trigger finger, and elbow pain.  Last seen March 12, 2018.      History of present illness:  Stephany Poorman who is a 53 year old [place] and [place]- speaking female with bilateral knee osteoarthritis, nonalcoholic steatohepatitis, history of H. pylori gastritis in 2008 treatment, history of severe esophagitis seen on most recent EGD 2014, former smoker.    She was seen during initial consultation June 06, 2017 for evaluation of inflammatory arthritis in the setting of polyarthralgia and low titer rheumatoid factor of 1-2.  At the end of that encounter, polyarthralgia was deemed to be due to mechanical causes including bilateral knee osteoarthritis, right shoulder pain due to osteoarthritis versus rotator cuff tendinopathy; right lateral epicondylitis, CMC osteoarthritis and right hand third digit stenosing tenosynovitis.  During this initial visit, she received corticosteroid injection to the right subacromial bursa as well as the right third palmar aspect of the MCP of the hand for stenosing tenosynovitis.  Given her history of esophagitis and severe allergy to NSAIDs we recommended topical Voltaren gel.    She presents today in follow-up via telephone televisit.    She confirms identity with the use of 2 identifiers and that she is at home.    Today primary complaint of right-sided shoulder pain.  Unable to localize and states it is "the entire shoulder."  States that symptoms exacerbated 2 weeks ago when she returned to work as a Secretary/administrator.  Has had difficulty sleeping on the right side.  States that she is able to do overhead activities with some difficulty.  Notes that activity makes it worse such  as mopping which she has to do to complete her work function.    Right-sided shoulder pain is associated with numbness which radiates to the fingers.  She is unable to localize which fingers.  Denies any increased incidence of dropped objects.    She has had recurrence of triggering of the right third middle finger and requesting injection.    For pain control she is alternating Tylenol with ibuprofen 400 mg twice a day with food.  States that she does have some stomach upset with ibuprofen and consistently takes omeprazole due to history of gastritis and esophagitis.    Denies any redness, warmth or swelling of her small joints of the hands, wrists, feet or ankles.    Rheumatologic Problem List:  1.  Bilateral knee osteoarthritis.  2.  Right shoulder pain likely due to osteoarthritis versus rotator cuff tendinitis versus bursitis.  3.  Right lateral epicondylitis.  4.  Hand osteoarthritis, CMC osteoarthritis.  5.  Right third digit of the hand stenosing tenosynovitis.  6.  Low titer rheumatoid factor 1:2.  7.  NSAID allergy.    Past medical history:  Patient Active Problem List:     Lump or mass in breast     Gastroesophageal reflux disease without esophagitis     Other specified gastritis     Abdominal pain, epigastric     Angioneurotic edema not elsewhere classified     Plantar Fasciitis, L     Leg pain     Tobacco abuse, in remission     Triggering of Finger, R 3rd  Right subacromial bursitis/rotator cuff tendinopathy     Chondromalacia patellae     Lower back pain     De Quervain's Tenosynovitis, R     CTS (Carpal Tunnel Syndrome), b/l     Family history of diabetes mellitus (DM)     Hypolipoproteinemia     Intermittent spinal claudication (HCC)     Vitamin D deficiency     Greater Trochanteric Bursitis, b/l     DDD (degenerative disc disease), lumbar     Obesity     Rapid Noctural Palpitations x 10 Seconds     Sweating     Microscopic hematuria     Reflux esophagitis     Lump of skin of lower extremity      Bilateral chronic knee pain     Bilateral arm pain     Positive occult stool blood test     Chronic right-sided low back pain with right-sided sciatica     SVT To 240 BPM March 2010. As Of 2017, Episodes q 2 Weeks, Usually Subside One Minute, Occ 15 Minutes.  Commonly Relieved With Vagal Maneuvers.     BMI 33.0-33.9,adult     Rapid Palpitations Only Once/Month, Lasting Seconds, Abolished by Valsalva, Aug 2018.      Polyarthralgia     Macular chorioretinal scar of left eye     Hyperopia of both eyes with astigmatism and presbyopia     Osteoarthritis of both knees     Lateral epicondylitis of right elbow     Trigger middle finger of right hand     Osteoarthritis of carpometacarpal (CMC) joint of left thumb     Right shoulder injury    Past surgical history:  Past Surgical History:  No date: CESAREAN DELIVERY ONLY      Comment:  breech  No date: [place] BREAST SPECIMEN      Comment:  s/p fibroadenoma on the L,   No date: SEPTOPLASTY/SUBMUCOUS RESECJ W/WO CARTILAGE GRF    Allergies:  Review of Patient's Allergies indicates:   Naproxen                Swelling    Comment:Ed visit with angioedema   Ibuprofen               Other (See Comments)    Comment:Epigastric pain, throat swelling?             05/06/12 pt states uses sometimes without issue   Pollen extract-tree*    Runny Nose    Comment:HA, itchy watery eyes    Medications:    Current Outpatient Medications on File Prior to Visit:  acetaminophen (TYLENOL) 500 MG tablet Take 500 mg by mouth every 6 (six) hours as needed for Pain Disp:  Rfl:    ibuprofen (ADVIL) 200 MG tablet Take 400 mg by mouth 2 (two) times daily with meals Disp:  Rfl:    cholecalciferol (VITAMIN D3) 2000 UNIT tablet Take 1 tablet by mouth daily Disp: 30 tablet Rfl: 11   metoprolol (TOPROL-XL) 100 MG 24 hr tablet Take 1 tablet by mouth daily Disp: 30 tablet Rfl: 11   omeprazole (PRILOSEC) 20 MG capsule Take 1 capsule by mouth 2 (two) times daily before meals Disp: 60 capsule Rfl: 11     No current  facility-administered medications on file prior to visit.     Health Maintenance:  Last eye exam- 04/2017  Last dental exam- 5 months ago.   Last colonoscopy- NEVER  Last bone density- NEVER  PPD or QuantiFERON TB Gold- negative, but unsure of date    FECAL OCCULT BLOOD AGE 72+ due on 03/21/2018  LIPID SCREENING due on 10/09/2018  MAMMOGRAPHY due on 02/14/2019  AWQ Questionnaire due on 04/23/2019  PEG SCORE (CHRONIC PAIN) due on 04/23/2019  PAP SMEAR due on 06/30/2019  HPV SCREENING due on 06/30/2019  HEALTH CARE PROXY due on 04/23/2023  TETANUS VACCINE(2 - Td) due on 03/08/2026  HIV SCREENING Completed  HEP C SCREEN Completed  INFLUENZA VACCINE Completed  PHYSICAL EXAM Completed  PNEUMOCOCCAL VACCINE SERIES (< 65) Completed; No procedure found.    ROS:  Review of Systems: Constitutional, Eyes, ENT/Mouth, Cardiovascular, Respiratory, GI, GU, Neuro, Psych, Heme/Lymph, Skin, Musculoskeletal, and Endocrine systems were reviewed and are NEGATIVE, except for what is documented in the note.    PE:  There were no vitals filed for this visit.  There is no height or weight on file to calculate BMI.  AAOX3    LABORATORY DATA:  Blood Counts  Lab Results   Component Value Date    WBC 10.4 04/06/2017    HGB 13.6 04/06/2017    HCT 40.8 04/06/2017    MCV 87.6 04/06/2017    PLTA 208 04/06/2017      Chemistries  Lab Results   Component Value Date    CA 8.9 12/04/2017    NA 143 12/04/2017    K 3.9 12/04/2017    CO2 28 12/04/2017    CL 107 12/04/2017    BUN 13 12/04/2017    CREAT 1.0 12/04/2017     CrCl cannot be calculated (Patient's most recent lab result is older than the maximum 7 days allowed.).    Liver Function Tests  Lab Results   Component Value Date    ALT 43 04/06/2017    AST 28 04/06/2017    ALBUMIN 4.2 04/06/2017    ALKPHOS 117 04/06/2017     Acute Phase Reactants  Lab Results   Component Value Date    ESR 12 01/31/2017    CRP 0.8 01/31/2017     Rheumatologic Labs    Lab Results   Component Value Date    RF POSITIVE (A)  01/31/2017   Rheumatid Factor (01/2017): 1:2    Lab Results   Component Value Date    CCPAB < 0.5 01/31/2017     Other Labs  Lab Results   Component Value Date    HGBA1C 5.2 06/30/2008     Lab Results   Component Value Date    TSHSC 1.380 02/07/2016     VITAMIN D,25 HYDROXY (ng/mL)   Date Value   01/31/2017 23 (L)     Lab Results   Component Value Date    CKTOTAL 95 06/16/2008     Lab Results   Component Value Date    LDL 129 10/08/2013     HEPATITIS B SURFACE ANTIGEN (no units)   Date Value   04/04/2006 NON-REACTIVE     HEPATITIS B SURFACE ANTIBODY (no units)   Date Value   04/04/2006 NEGATIVE     HEPATITIS C ANTIBODY (no units)   Date Value   04/04/2006 NEGATIVE     Imaging reviewed personally by me today:    Bilateral knee x-rays (12/11/2017): Consistent with osteoarthritis.      Left Hand Xray (February 2019):  Left hand x-ray consistent with osteoarthritis.     Chest Xray (11/2016):  No pneumothorax or pneumonia.     Right Ankle Xray (01/2015):  Lateral ankle soft  tissue swelling. No fracture.     Bilateral Knee Xray (03/2013):  1. Tiny marginal osteophytes along the right medial tibiofemoral   compartment. 2. Normal alignment and preservation of the cartilage spaces.     Right Hand Xray (02/2008):  Unremarkable right hand radiographs.    Lumbar Xray (10/2007):  Normal alignment of the vertebral bodies. Vertebral body heights are well  maintained. An peritumoral disc spaces are normal. No acute fracture,  subluxation or dislocation. No evidence of facet disease. Evidence of   coarse interarticularis defect. SI joints are normal.    Assessment: Farryn Linares is a 53 year old [place] speaking female with severe esophagitis, allergy to NSAID, CMC osteoarthritis, hand also arthritis, bilateral knee osteoarthritis, chronic lower back pain likely degenerative disc disease.    Today she complains primarily of recurrence of right third middle finger triggering (stenosing tenosynovitis)  and right-sided shoulder pain which is likely consistent with rotator cuff tendinitis/bursitis.    Plan/Recommendations:  1.  Right third digit of the hand stenosing tenosynovitis.  Advised the patient to take Tylenol up to 3 times a day for this problem.  Recommend that she discontinue ibuprofen.  We will schedule her in the next 4 to 6 weeks for corticosteroid injection when safe to do so.    2.  Right shoulder pain which is likely consistent with tendinitis versus bursitis versus osteoarthritis.  Due to radiation of symptoms that she may also have cervical spinal stenosis or cervical due to disc disease.  Recommending shoulder injection in the next 4 to 6 weeks when safe to do so.  She will also likely need to resume physical therapy for the right shoulder.  At the next visit we will x-ray the right shoulder and cervical spine in addition to refer her to physical therapy.  Recommend that she stop ibuprofen given prior history of gastritis and esophagitis related to NSAID use she also has a documented NSAID allergy.  Recommend that she take Tylenol up to 3 times a day.  She may apply Voltaren gel to the shoulder.    3.  History of NSAID allergy with what sounds like angioedema.  I did advise the patient that she should avoid oral NSAIDs given previous history of allergic reaction.    HEALTH MAINTENANCE: N/A  EDUCATION:  The total face to face time with patient was 15 minutes.  Greater than 50% of that time was spent on counseling/educating patient regarding potential etiologies, pathophysiology, treatment options, need for further work-up including blood work, imaging and referrals, reviewing previous lab results and radiology findings, reviewing internal and external medical records, and coordinating medical care, outside of the time needed to perform any procedures.      REMINDERS FOR NEXT VISIT: N/A    I discussed the likely diagnosis(es),  prognosis(es), and the various treatment options in detail with the  patient, including observation.     Risks and benefits of the treatment plan(s) were discussed.     The patient's questions have been addressed and answered.     We discussed importance of medication compliance.     The patient was ready to learn and no apparent learning barriers were identified.     Follow-up in 6 weeks, earlier if needed.    Deborah [name] verbally expressed an understanding and agreement with the plan as outlined above.    This dictation was made using voice recognition software.  There is potential for transcription errors and incorrect word usage based on  limitations of the software.      Darrell Leonhardt Adolf-Ubokudom, MD, Julious Payer.

## 2018-09-10 ENCOUNTER — Other Ambulatory Visit: Payer: Self-pay

## 2018-09-17 ENCOUNTER — Other Ambulatory Visit (HOSPITAL_BASED_OUTPATIENT_CLINIC_OR_DEPARTMENT_OTHER): Payer: Self-pay | Admitting: Family Medicine

## 2018-09-17 DIAGNOSIS — K219 Gastro-esophageal reflux disease without esophagitis: Secondary | ICD-10-CM

## 2018-09-17 DIAGNOSIS — K2970 Gastritis, unspecified, without bleeding: Secondary | ICD-10-CM

## 2018-09-17 NOTE — Progress Notes (Signed)
PER Pharmacy, Deborah [name] is a 53 year old female has requested a refill of omeprazole, calcium vitamin d .      Last Office Visit: 04/22/18 with margaret curtin   Last Physical Exam: 03/13/2017      Other Med Adult:  Most Recent BP Reading(s)  04/22/18 : 115/77        Cholesterol (mg/dL)   Date Value   10/08/2013 192     LOW DENSITY LIPOPROTEIN DIRECT (mg/dL)   Date Value   10/08/2013 129     HIGH DENSITY LIPOPROTEIN (mg/dL)   Date Value   10/08/2013 48     TRIGLYCERIDES (mg/dL)   Date Value   10/08/2013 223 (H)         THYROID SCREEN TSH REFLEX FT4 (uIU/mL)   Date Value   02/07/2016 1.380         TSH (THYROID STIM HORMONE) (uIU/mL)   Date Value   12/22/2010 1.31       HEMOGLOBIN A1C (%)   Date Value   06/30/2008 5.2       No results found for: POCA1C      INR (no units)   Date Value   06/15/2008 1.0 (L)       SODIUM (mmol/L)   Date Value   12/04/2017 143       POTASSIUM (mmol/L)   Date Value   12/04/2017 3.9           CREATININE (mg/dL)   Date Value   12/04/2017 1.0       Documented patient preferred pharmacies:    [facility], [place].  Phone: [phone] Fax: [phone]

## 2018-09-17 NOTE — Progress Notes (Signed)
PER Pharmacy, Deborah [name] is a 53 year old female has requested a refill of voltaren .      Last Office Visit: 04/22/18 with margaret curtin  Last Physical Exam: 03/13/2017      Other Med Adult:  Most Recent BP Reading(s)  04/22/18 : 115/77        Cholesterol (mg/dL)   Date Value   10/08/2013 192     LOW DENSITY LIPOPROTEIN DIRECT (mg/dL)   Date Value   10/08/2013 129     HIGH DENSITY LIPOPROTEIN (mg/dL)   Date Value   10/08/2013 48     TRIGLYCERIDES (mg/dL)   Date Value   10/08/2013 223 (H)         THYROID SCREEN TSH REFLEX FT4 (uIU/mL)   Date Value   02/07/2016 1.380         TSH (THYROID STIM HORMONE) (uIU/mL)   Date Value   12/22/2010 1.31       HEMOGLOBIN A1C (%)   Date Value   06/30/2008 5.2       No results found for: POCA1C      INR (no units)   Date Value   06/15/2008 1.0 (L)       SODIUM (mmol/L)   Date Value   12/04/2017 143       POTASSIUM (mmol/L)   Date Value   12/04/2017 3.9           CREATININE (mg/dL)   Date Value   12/04/2017 1.0       Documented patient preferred pharmacies:    [facility], [place].  Phone: [phone] Fax: [phone]

## 2018-09-18 MED ORDER — DICLOFENAC SODIUM 1 % TD GEL
TRANSDERMAL | 3 refills | Status: DC
Start: 2018-09-18 — End: 2019-01-23

## 2018-09-23 ENCOUNTER — Other Ambulatory Visit: Payer: Self-pay

## 2018-10-02 ENCOUNTER — Other Ambulatory Visit: Payer: Self-pay

## 2018-10-07 ENCOUNTER — Ambulatory Visit (HOSPITAL_BASED_OUTPATIENT_CLINIC_OR_DEPARTMENT_OTHER): Payer: No Typology Code available for payment source | Admitting: Urology

## 2018-10-14 ENCOUNTER — Ambulatory Visit: Payer: No Typology Code available for payment source | Attending: Family Medicine | Admitting: Urology

## 2018-10-14 DIAGNOSIS — R32 Unspecified urinary incontinence: Secondary | ICD-10-CM | POA: Diagnosis present

## 2018-10-14 NOTE — Progress Notes (Signed)
portuguese interpreter used to conduct the televisit.    CC: urinary incontinence, hx of microheme, hx of kidney stones    This is a 53 year old female patient of Helane Gunther, APRN  who has been referred today in consultation for episodic urinary incontinence.     Last seen in the office with Dr Hillard Danker in 10/2013. At that time, a RUS was negative for stones, and the pt was encouraged to f/u prn.      Previously the pt was seen in our office for work up of hematuria w/:   06/2012 neg cysto  05/2012 renal u/s showed a 5 mm RLP stone that the pt passed on her own; F/u CT 1 mo later showed that she had passed the stone.   Cytology atypical.  Since pt was last seen, her UA continues to show trace blood. Most recent UA at the time of referral showed 0-2 rbcs.     At the time of referral in January, a ucx showed:  URINE CULTURE/COLONY COUNT (no units)   Date Value   04/22/2018 GROUP B STREPTOCOCCUS (A)   04/22/2018 COLONY COUNT   10,000 - 50,000 ORGANISMS/ML     09/11/2006 NO GROWTH AFTER 24 HOURS     She was not treated w/ abx by her PCP.   She notes that her sx resolved with homeopathic tea.     She states that she has episodes of urinary frequency urgency and leakage x 2-3 weeks every one - two months.   During these episodes her leakage is moderate volume, and soaks her underwear but not her pants.   Also notes associated + strong smell to the urine but feels she stays well hydrated.   Notes it is not a foul smell, but more "like old urine".   No dysuria.   No gross heme.     G1P1, CS, 53 yrs old. No SUI.      PMP: 13 yrs, + hot flashes.   Has not had sex since menopause as she has been divorced.   Denies vaginal dryness or noticeable blood on the TP.   No hx of estrogen supplement.      No recent kidney pain.   No fever/chills.   No nausea/vomiting/diarrhea.     Fluid intake: 5 bottles water/d.   Caffeine: 1 soda every other day.      B/M: 1x/day, no constipation.     Hx of DDD.     Prior smoking hx.     Has noted some  issues with her vision, so had her BS tested and BS 99 at the time. Notes that what precipitated this episode was an aura with vision changes that lasted 30 min. Carries dx of Labyrinthitis. Has never been worked up by a neurologist. + dizziness and a general weakness with difficulty ambulating. No known hx of MS or fam hx of MS.     Past Medical History:  No date: Esophageal reflux  05/18/2017: Hyperopia of both eyes with astigmatism and presbyopia  No date: Irregular menstrual cycle  05/18/2017: Macular chorioretinal scar of left eye      Comment:  Temporal to fovea, left eye  No date: Pregnant state, incidental      Comment:  c/s breech  3/10: SVT (supraventricular tachycardia) (HCC)      Comment:  [place]    Past Surgical History:  No date: CESAREAN DELIVERY ONLY      Comment:  breech  No date: [place] BREAST SPECIMEN  Comment:  s/p fibroadenoma on the L,   No date: SEPTOPLASTY/SUBMUCOUS RESECJ W/WO CARTILAGE GRF      Current Outpatient Medications on File Prior to Visit:  Calcium Carb-Cholecalciferol (CALCIUM + D3) 600-200 MG-UNIT TABS TAKE 1 TABLET BY MOUTH TWO TIMES DAILY Disp: 60 tablet Rfl: 11   omeprazole (PRILOSEC) 20 MG capsule Take 1 capsule by mouth 2 (two) times daily before meals Disp: 60 capsule Rfl: 11   diclofenac (VOLTAREN) 1 % GEL Gel Apply to both hands, both elbows and both knees up to 4 times a day. Do Not Exceed 32 g in 24 Hours Disp: 100 g Rfl: 3   acetaminophen (TYLENOL) 500 MG tablet Take 500 mg by mouth every 6 (six) hours as needed for Pain Disp:  Rfl:    ibuprofen (ADVIL) 200 MG tablet Take 400 mg by mouth 2 (two) times daily with meals Disp:  Rfl:    cholecalciferol (VITAMIN D3) 2000 UNIT tablet Take 1 tablet by mouth daily Disp: 30 tablet Rfl: 11   metoprolol (TOPROL-XL) 100 MG 24 hr tablet Take 1 tablet by mouth daily Disp: 30 tablet Rfl: 11     No current facility-administered medications on file prior to visit.     Review of Patient's Allergies indicates:   Naproxen                 Swelling    Comment:Ed visit with angioedema   Ibuprofen               Other (See Comments)    Comment:Epigastric pain, throat swelling?             05/06/12 pt states uses sometimes without issue   Pollen extract-tree*    Runny Nose    Comment:HA, itchy watery eyes      Social History     Socioeconomic History    Marital status: Divorced     Spouse name: Not on file    Number of children: 1    Years of education: Not on file    Highest education level: Not on file   Occupational History    Occupation: housecleaning     Employer: [employer]   Social Needs    Emergency planning/management officer strain: Not on file    Food insecurity:     Worry: Not on file     Inability: Not on file    Transportation needs:     Medical: Not on file     Non-medical: Not on file   Tobacco Use    Smoking status: Former Smoker     Packs/day: 0.50     Years: 27.00     Pack years: 13.50     Types: Cigarettes     Last attempt to quit: 06/28/2002     Years since quitting: 16.3    Smokeless tobacco: Never Used   Substance and Sexual Activity    Alcohol use: No     Alcohol/week: 0.0 standard drinks    Drug use: No    Sexual activity: Not Currently     Partners: Male     Comment: no STI hx; HIV neg 2002   Lifestyle    Physical activity:     Days per week: Not on file     Minutes per session: Not on file    Stress: Not on file   Relationships    Social connections:     Talks on phone: Not on file     Gets together: Not  on file     Attends religious service: Not on file     Active member of club or organization: Not on file     Attends meetings of clubs or organizations: Not on file     Relationship status: Not on file    Intimate partner violence:     Fear of current or ex partner: Not on file     Emotionally abused: Not on file     Physically abused: Not on file     Forced sexual activity: Not on file   Other Topics Concern    Military Service Not Asked    Blood Transfusions Not Asked    Caffeine Concern Not Asked    Occupational Exposure Not  Asked    Hobby Hazards Not Asked    Sleep Concern Not Asked    Stress Concern Yes    Weight Concern Yes    Special Diet Yes    Back Care Not Asked    Exercise No    Bike Helmet Not Asked    Seat Belt Yes    Self-Exams Yes   Social History Narrative    [place], [place]. To [place] 2000.    Lives with her son and her parents. Divorced from husband.    Denies DV. No guns in home.        No regular exercise. Dental care in place.       Social history has been reviewed in the chart, and no new changes are noted.    COLOR (no units)   Date Value   04/22/2018 YELLOW     CLARITY (no units)   Date Value   04/22/2018 CLEAR     GLUCOSE, URINE (MG/DL)   Date Value   04/22/2018 NEGATIVE     BILIRUBIN, URINE (no units)   Date Value   04/22/2018 NEGATIVE     KETONE, URINE (MG/DL)   Date Value   04/22/2018 NEGATIVE     SPECIFIC GRAVITY URINE (no units)   Date Value   04/22/2018 1.015     OCCULT BLOOD, URINE (no units)   Date Value   04/22/2018 TRACE (A)     PH URINE (no units)   Date Value   04/22/2018 5.5     PROTEIN, URINE (MG/DL)   Date Value   04/22/2018 NEGATIVE     UROBILINOGEN URINE (mg/dl)   Date Value   10/27/2013 0.2     NITRITE, URINE (no units)   Date Value   04/22/2018 NEGATIVE     LEUKOCYTE ESTERASE (no units)   Date Value   04/22/2018 NEGATIVE         CREATININE (mg/dL)   Date Value   12/04/2017 1.0   04/06/2017 0.9   12/02/2016 0.7     HEMOGLOBIN A1C (%)   Date Value   06/30/2008 5.2   09/20/2005 4.9       No results found for: POCA76C     53 year old female patient seen today in consultation for  episodic urinary incontinence. Pt also seen with [place] in the past for hx of microheme and kidney stones.     Hx of microheme: Former smoker. Last full work-up in 2014. UA continues to show trace blood. Hold off on hematuria work-up.     Hx of kidney stones: Asymptomatic. Last imaging study in 06/2012 w/ question of punctate stones.     Urinary incontinence: Pt has episodes of intermittent urinary urgency w/ leakage and  frequency . Currently  not symptomatic. Plan for repeat UA and ucx to make sure GBS UTI (January) has resolved.     Pt to drop off a urine specimen to [facility].   Provided her with the Central Scheduling number.     Recommend UDS to further evaluate this patients urinary symptoms. I explained the entire procedure to the patient and Lorianne agrees to undergo UDS. Our nursing staff will schedule the patient and Dr. Hillard Danker will follow up after to review results.    RTC for review of UDS results.     [place], PA-C

## 2018-10-14 NOTE — Progress Notes (Signed)
The initial history and  information was obtained by the PA-C, who also made a detailed record of this TELEvisit. I discussed management with the PA-C and performed a visit examination of the patient. I reviewed the PA-C's note and agree with the documented findings and have formulated the plan of care. I also reviewed the patient's past medical history/problem list, past surgical history, medication list, social history and allergies and disposition decisions. Please see PA note below for additional information.    Last seen in 2015    Incontinence    Recommend UDS to further investigate this patient's voiding dynamics.  I have explained the entire procedure to the patient and why it is important to establish a diagnosis via urodynamics first before formulating a treatment plan which may consist of behavioral modification, surgery, medication, biofeedback, or a combination of the above.  Patient has elected to undergo UDS and will be scheduled.

## 2018-10-15 ENCOUNTER — Encounter (HOSPITAL_BASED_OUTPATIENT_CLINIC_OR_DEPARTMENT_OTHER): Payer: Self-pay

## 2018-10-16 ENCOUNTER — Encounter (HOSPITAL_BASED_OUTPATIENT_CLINIC_OR_DEPARTMENT_OTHER): Payer: Self-pay

## 2018-10-16 ENCOUNTER — Telehealth (HOSPITAL_BASED_OUTPATIENT_CLINIC_OR_DEPARTMENT_OTHER): Payer: Self-pay | Admitting: Internal Medicine

## 2018-10-16 NOTE — Progress Notes (Signed)
Left vm message rescheduling 7/16 appointment to 7/13 with Dr. Marcine Matar

## 2018-10-18 ENCOUNTER — Telehealth (HOSPITAL_BASED_OUTPATIENT_CLINIC_OR_DEPARTMENT_OTHER): Payer: Self-pay | Admitting: Internal Medicine

## 2018-10-21 ENCOUNTER — Encounter (HOSPITAL_BASED_OUTPATIENT_CLINIC_OR_DEPARTMENT_OTHER): Payer: Self-pay | Admitting: Internal Medicine

## 2018-10-21 ENCOUNTER — Ambulatory Visit: Payer: No Typology Code available for payment source | Attending: Family Medicine | Admitting: Internal Medicine

## 2018-10-21 ENCOUNTER — Other Ambulatory Visit: Payer: Self-pay

## 2018-10-21 ENCOUNTER — Telehealth (HOSPITAL_BASED_OUTPATIENT_CLINIC_OR_DEPARTMENT_OTHER): Payer: Self-pay | Admitting: Internal Medicine

## 2018-10-21 VITALS — BP 107/70 | HR 66 | Temp 97.5°F | Resp 20 | Wt 188.0 lb

## 2018-10-21 DIAGNOSIS — M170 Bilateral primary osteoarthritis of knee: Secondary | ICD-10-CM | POA: Insufficient documentation

## 2018-10-21 DIAGNOSIS — M65331 Trigger finger, right middle finger: Secondary | ICD-10-CM | POA: Diagnosis not present

## 2018-10-21 DIAGNOSIS — M1812 Unilateral primary osteoarthritis of first carpometacarpal joint, left hand: Secondary | ICD-10-CM | POA: Diagnosis present

## 2018-10-21 NOTE — Progress Notes (Signed)
[facility]  Rheumatology Follow-Up Patient Note    Date of Visit: 10/21/2018    Patient: Deborah [name]    PCP: Helane Gunther, APRN    Primary Language: [place] [name]  Interpreter: None.    Reason for Visit:  Osteoarthritis of hands, trigger finger, and elbow pain.  Last seen June 2020.    History of present illness:  Deborah [name] who is a 53 year old [place] and [place]- speaking female with bilateral knee osteoarthritis, nonalcoholic steatohepatitis, history of H. pylori gastritis in 2008 treatment, history of severe esophagitis seen on most recent EGD 2014, former smoker.    She was seen during initial consultation June 06, 2017 for evaluation of inflammatory arthritis in the setting of polyarthralgia and low titer rheumatoid factor of 1-2.  At the end of that encounter, polyarthralgia was deemed to be due to mechanical causes including bilateral knee osteoarthritis, right shoulder pain due to osteoarthritis versus rotator cuff tendinopathy; right lateral epicondylitis, CMC osteoarthritis and right hand third digit stenosing tenosynovitis.  During this initial visit, she received corticosteroid injection to the right subacromial bursa as well as the right third palmar aspect of the MCP of the hand for stenosing tenosynovitis.  Given her history of esophagitis and severe allergy to NSAIDs we recommended topical Voltaren gel.    She presents today in follow-up.    During her last visit she noted that she had triggering of the right third finger of the hand and also has had painful left CMC osteoarthritis.  She also states that she is having some right-sided shoulder pain but would prefer to have the hands injected over the shoulder today.  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  Does note that the right hand does wake her up at night due to triggering.  She is taking Tylenol and ibuprofen 2-3 times a  week for the these problems.    Denies any fever, chills, chest pain or shortness of breath, cough, rhinorrhea or changes in senses of taste or smell.    Rheumatologic Problem List:  1.  Bilateral knee osteoarthritis.  2.  Right shoulder pain likely due to osteoarthritis versus rotator cuff tendinitis versus bursitis.  3.  Right lateral epicondylitis.  4.  Hand osteoarthritis, CMC osteoarthritis.  5.  Right third digit of the hand stenosing tenosynovitis.  6.  Low titer rheumatoid factor 1:2.  7.  NSAID allergy.    Past medical history:  Patient Active Problem List:     Lump or mass in breast     Gastroesophageal reflux disease without esophagitis     Other specified gastritis     Abdominal pain, epigastric     Angioneurotic edema not elsewhere classified     Plantar Fasciitis, L     Leg pain     Tobacco abuse, in remission     Triggering of Finger, R 3rd     Right subacromial bursitis/rotator cuff tendinopathy     Chondromalacia patellae     Lower back pain     De Quervain's Tenosynovitis, R     CTS (Carpal Tunnel Syndrome), b/l     Family history of diabetes mellitus (DM)     Hypolipoproteinemia     Intermittent spinal claudication (HCC)     Vitamin D deficiency     Greater Trochanteric Bursitis, b/l     DDD (degenerative disc disease), lumbar     Obesity  Rapid Noctural Palpitations x 10 Seconds     Sweating     Microscopic hematuria     Reflux esophagitis     Lump of skin of lower extremity     Bilateral chronic knee pain     Bilateral arm pain     Positive occult stool blood test     Chronic right-sided low back pain with right-sided sciatica     SVT To 240 BPM March 2010. As Of 2017, Episodes q 2 Weeks, Usually Subside One Minute, Occ 15 Minutes.  Commonly Relieved With Vagal Maneuvers.     BMI 33.0-33.9,adult     Rapid Palpitations Only Once/Month, Lasting Seconds, Abolished by Valsalva, Aug 2018.      Polyarthralgia     Macular chorioretinal scar of left eye     Hyperopia of both eyes with astigmatism and  presbyopia     Osteoarthritis of both knees     Lateral epicondylitis of right elbow     Trigger middle finger of right hand     Osteoarthritis of carpometacarpal (CMC) joint of left thumb     Right shoulder injury    Past surgical history:  Past Surgical History:  No date: CESAREAN DELIVERY ONLY      Comment:  breech  No date: [place] BREAST SPECIMEN      Comment:  s/p fibroadenoma on the L,   No date: SEPTOPLASTY/SUBMUCOUS RESECJ W/WO CARTILAGE GRF    Allergies:  Review of Patient's Allergies indicates:   Naproxen                Swelling    Comment:Ed visit with angioedema   Ibuprofen               Other (See Comments)    Comment:Epigastric pain, throat swelling?             05/06/12 pt states uses sometimes without issue   Pollen extract-tree*    Runny Nose    Comment:HA, itchy watery eyes    Medications:    Current Outpatient Medications on File Prior to Visit:  Calcium Carb-Cholecalciferol (CALCIUM + D3) 600-200 MG-UNIT TABS TAKE 1 TABLET BY MOUTH TWO TIMES DAILY Disp: 60 tablet Rfl: 11   omeprazole (PRILOSEC) 20 MG capsule Take 1 capsule by mouth 2 (two) times daily before meals Disp: 60 capsule Rfl: 11   diclofenac (VOLTAREN) 1 % GEL Gel Apply to both hands, both elbows and both knees up to 4 times a day. Do Not Exceed 32 g in 24 Hours Disp: 100 g Rfl: 3   acetaminophen (TYLENOL) 500 MG tablet Take 500 mg by mouth every 6 (six) hours as needed for Pain Disp:  Rfl:    ibuprofen (ADVIL) 200 MG tablet Take 400 mg by mouth 2 (two) times daily with meals Disp:  Rfl:    cholecalciferol (VITAMIN D3) 2000 UNIT tablet Take 1 tablet by mouth daily Disp: 30 tablet Rfl: 11   metoprolol (TOPROL-XL) 100 MG 24 hr tablet Take 1 tablet by mouth daily Disp: 30 tablet Rfl: 11     No current facility-administered medications on file prior to visit.     Health Maintenance:  Last eye exam- 04/2017  Last dental exam- 5 months ago.   Last colonoscopy- NEVER  Last bone density- NEVER  PPD or QuantiFERON TB Gold- negative, but unsure of  date    FECAL OCCULT BLOOD AGE 59+ due on 03/21/2018  LIPID SCREENING due on 10/09/2018  MAMMOGRAPHY due on 02/14/2019  INFLUENZA VACCINE(1) due on 12/10/2018  AWQ Questionnaire due on 04/23/2019  PEG SCORE (CHRONIC PAIN) due on 04/23/2019  PAP SMEAR due on 06/30/2019  HPV SCREENING due on 06/30/2019  HEALTH CARE PROXY due on 04/23/2023  TETANUS VACCINE(2 - Td) due on 03/08/2026  HIV SCREENING Completed  HEP C SCREEN Completed  PHYSICAL EXAM Completed  PNEUMOCOCCAL VACCINE SERIES (< 65) Completed; No procedure found.    ROS:  Review of Systems: Constitutional, Eyes, ENT/Mouth, Cardiovascular, Respiratory, GI, GU, Neuro, Psych, Heme/Lymph, Skin, Musculoskeletal, and Endocrine systems were reviewed and are NEGATIVE, except for what is documented in the note.    PE:   10/21/18  1301   BP: 107/70   Pulse: 66   Resp: 20   Temp: 97.5 F (36.4 C)   SpO2: 97%   Weight: 85.3 kg (188 lb)     Body mass index is 34.95 kg/m.  GENERAL: NAD. Normocephalic/Atraumatic. Alert and oriented to person, place, and time. Good eye contact. Full affect. Follows commands appropriately.  Well-groomed.    HEENT: Pupils were equal, round and reactive. Extraocular motions are intact. Mucous membranes are moist. There is no scalp rash. No malar rash. Anicteric sclerae. Non-injected conjunctivae. No nystagmus. Oropharynx clear. Tongue is midline. Symmetric palate raise.  No ulcerations. No exudate. No erythema.  Tympanic membranes are clear.  No parotid enlargement.   NECK: Supple. No appreciable cervical lymphadenopathy.  LUNGS: Clear to auscultation without wheezes, rhonchi, rales, or crackles.  CV: S1/S2 normal, without murmurs, gallops, or rubs.  ABDOMEN:  Bowel sounds in all four quadrants, soft, nontender, nondistended. No appreciable organomegaly.  EXTREMITIES: Warm and well-perfused without any sign of cyanosis, clubbing, or edema.  SKIN: No rashes, nodules, bruising, hyperpigmentation, hypopigmentation, telangiectasias, periungal  erythema, splinter hemorrhages, sclerodermatous changes, icterus, petechiae, palpable purpura, tethering, striae, keloid, nail abnormalities or tophi.    MUSCULOSKELETAL:   Hypertrophy of PIPs noted.    Triggering of the right third MCP was elicited during today's visit. Grip strength is normal bilaterally.  Squaring of the left [facility] noted with associated tenderness on palpation..  No swelling in the hands.  No swelling in the wrists.  Unable to provoke feelings of pain or Tinel's.  No swelling in elbows. No swelling in the shoulders.  No restriction in range of motion in fingers, wrists, elbows, or shoulders.   No tender joints in the fingers, wrists, elbows or shoulders.    No pain over the sternoclavicular joint, AC joint or TMJs.   No restriction in ROM of the cervical spine.  No focal back tenderness.  Negative straight leg raise.  Negative Faber's.  Negative Schober's.  Negative logroll.  No restriction in range of motion in hips, knees, or ankles.   No swelling in the knees or ankles.  Bilateral valgus deformity of the knees with hypertrophic changes and crepitations.  No Achille's swelling or tenderness.  There is no tenderness on palpation of the MTPs.    No dactylitis or splaying of the toes.  NEURO: Cranial nerves II-XII intact. Patellar reflexes, Achilles reflexes and brachioradialis reflexes are 2/2 and symmetric. Narrow-based gait.  Power is 5/5 in all four limbs and without weakness. No difficulty getting up from seated position without using hands or out of a supine position.  No assistive ambulatory devices.    LABORATORY DATA:  Blood Counts  Lab Results   Component Value Date    WBC 10.4 04/06/2017    HGB 13.6 04/06/2017    HCT 40.8 04/06/2017  MCV 87.6 04/06/2017    PLTA 208 04/06/2017      Chemistries  Lab Results   Component Value Date    CA 8.9 12/04/2017    NA 143 12/04/2017    K 3.9 12/04/2017    CO2 28 12/04/2017    CL 107 12/04/2017    BUN 13 12/04/2017    CREAT 1.0 12/04/2017     CrCl  cannot be calculated (Patient's most recent lab result is older than the maximum 7 days allowed.).    Liver Function Tests  Lab Results   Component Value Date    ALT 43 04/06/2017    AST 28 04/06/2017    ALBUMIN 4.2 04/06/2017    ALKPHOS 117 04/06/2017     Acute Phase Reactants  Lab Results   Component Value Date    ESR 12 01/31/2017    CRP 0.8 01/31/2017     Rheumatologic Labs    Lab Results   Component Value Date    RF POSITIVE (A) 01/31/2017   Rheumatid Factor (01/2017): 1:2    Lab Results   Component Value Date    CCPAB < 0.5 01/31/2017     Other Labs  Lab Results   Component Value Date    HGBA1C 5.2 06/30/2008     Lab Results   Component Value Date    TSHSC 1.380 02/07/2016     VITAMIN D,25 HYDROXY (ng/mL)   Date Value   01/31/2017 23 (L)     Lab Results   Component Value Date    CKTOTAL 95 06/16/2008     Lab Results   Component Value Date    LDL 129 10/08/2013     HEPATITIS B SURFACE ANTIGEN (no units)   Date Value   04/04/2006 NON-REACTIVE     HEPATITIS B SURFACE ANTIBODY (no units)   Date Value   04/04/2006 NEGATIVE     HEPATITIS C ANTIBODY (no units)   Date Value   04/04/2006 NEGATIVE     Imaging reviewed personally by me today:    Bilateral knee x-rays (12/11/2017): Consistent with osteoarthritis.      Left Hand Xray (February 2019):  Left hand x-ray consistent with osteoarthritis.     Chest Xray (11/2016):  No pneumothorax or pneumonia.     Right Ankle Xray (01/2015):  Lateral ankle soft tissue swelling. No fracture.     Bilateral Knee Xray (03/2013):  1. Tiny marginal osteophytes along the right medial tibiofemoral   compartment. 2. Normal alignment and preservation of the cartilage spaces.     Right Hand Xray (02/2008):  Unremarkable right hand radiographs.    Lumbar Xray (10/2007):  Normal alignment of the vertebral bodies. Vertebral body heights are well  maintained. An peritumoral disc spaces are normal. No acute fracture,  subluxation or dislocation. No evidence of facet disease. Evidence of    coarse interarticularis defect. SI joints are normal.    Assessment: Deborah [name] is a 53 year old [place] speaking female with severe esophagitis, allergy to NSAID, CMC osteoarthritis, hand also arthritis, bilateral knee osteoarthritis, chronic lower back pain likely degenerative disc disease.    Today she complains primarily of recurrence of right third middle finger triggering (stenosing tenosynovitis) and right-sided shoulder pain which is likely consistent with rotator cuff tendinitis/bursitis.    Plan/Recommendations:  1.  Right third digit of the hand stenosing tenosynovitis.  Advised the patient to take Tylenol up to 3 times a day for this problem.  Again today recommending that  she discontinue ibuprofen.  Corticosteroid injection offered.  Please see procedure note below.  Procedure note     Date: 10/21/2018    Deborah [name] is a 53 year old female who is here for the following procedure.     Procedure: Right third digit of the hand corticosteroid injection for stenosing tenosynovitis     Indication: Painful triggering     I explained the risks (bleeding, infection, pain, steroid atrophy, tendon rupture, hypopigmentation of the skin) and benefits to the patient and Deborah [name] understood and gave consent to proceed. After time-out was performed, the area over the palmar aspect of the right third MCP was prepped in a sterile manner using chlorhexidene swab. Topical ethyl chloride was used as anesthetic.     Using a 5/8 inch 25 gauge needle, the palmar aspect of the right third MCP was injected with 0.5 cc of 1% lidocaine and 0.5 cc (20 mg) of methylprednisolone. There were no complications. The patient has been told to ice and rest the right hand for the next few days and to call if there is increased erythema, swelling, pain, or fevers.    2.  Left CMC osteoarthritis.  Patient has painful left CMC osteoarthritis.  Advised her to continue use of Voltaren gel and  Tylenol up to 3 g daily.  Avoid oral NSAIDs.  She was offered corticosteroid injection.  Please see procedure note below.    Procedure note     Date: 10/21/2018    Deborah [name] is a 53 year old female who is here for the following procedure.     Procedure: Left CMC corticosteroid injection     Indication: Pain, osteoarthritis     I explained the risks (bleeding, infection, pain, steroid atrophy, tendon rupture, hypopigmentation of the skin) and benefits to the patient and Deborah [name] understood and gave verbal consent to proceed. After time-out was performed, the area over the left [facility] was prepped in a sterile manner using chlorhexidene swab. Topical ethyl chloride was used as anesthetic.     Using a 5/8 inch 25 gauge needle, the left CMC was injected with 0.5 cc of 1% lidocaine and 0.5 cc (20 mg) of methylprednisolone. There were no complications. The patient has been told to ice and rest the left hand for the next few days and to call if there is increased erythema, swelling, pain, or fevers.    3.  Right shoulder pain is symptomatically better.  If patient is not improved in 4 weeks she will be seen face-to-face for shoulder injection.  She may ultimately require injection referral to physical therapy and x-ray of the shoulder and cervical spine.  No abnormalities elicited on examination today.  If patient is improved and does not wish to have injection she will cancel appointment.    4.  History of NSAID allergy with what sounds like angioedema.  I did advise the patient that she should avoid oral NSAIDs given previous history of allergic reaction.    HEALTH MAINTENANCE: N/A  EDUCATION:  The total face to face time with patient was 67mnutes.  Greater than 50% of that time was spent on counseling/educating patient regarding potential etiologies, pathophysiology, treatment options, need for further work-up including blood work, imaging and referrals, reviewing previous lab results and  radiology findings, reviewing internal and external medical records, and coordinating medical care, outside of the time needed to perform any procedures.      REMINDERS FOR NEXT VISIT: N/A  I discussed the likely diagnosis(es),  prognosis(es), and the various treatment options in detail with the patient, including observation.     Risks and benefits of the treatment plan(s) were discussed.     The patient's questions have been addressed and answered.     We discussed importance of medication compliance.     The patient was ready to learn and no apparent learning barriers were identified.     Follow-up in 4 weeks face to face, earlier if needed.    Deborah [name] verbally expressed an understanding and agreement with the plan as outlined above.    This dictation was made using voice recognition software.  There is potential for transcription errors and incorrect word usage based on limitations of the software.      Emem Adolf-Ubokudom, MD, [facility].

## 2018-10-30 ENCOUNTER — Other Ambulatory Visit: Payer: Self-pay

## 2018-11-11 ENCOUNTER — Other Ambulatory Visit: Payer: Self-pay

## 2018-11-15 ENCOUNTER — Telehealth (HOSPITAL_BASED_OUTPATIENT_CLINIC_OR_DEPARTMENT_OTHER): Payer: Self-pay | Admitting: Internal Medicine

## 2018-11-15 NOTE — Progress Notes (Signed)
Patient denies fevers, chills, chest pain, dyspnea, cough, sore throat, anosmia, and diarrhea. Patient denies recent travel.

## 2018-11-18 ENCOUNTER — Other Ambulatory Visit: Payer: Self-pay

## 2018-11-18 ENCOUNTER — Encounter (HOSPITAL_BASED_OUTPATIENT_CLINIC_OR_DEPARTMENT_OTHER): Payer: Self-pay | Admitting: Internal Medicine

## 2018-11-18 ENCOUNTER — Ambulatory Visit: Payer: No Typology Code available for payment source | Attending: Internal Medicine | Admitting: Internal Medicine

## 2018-11-18 VITALS — BP 107/70 | HR 85 | Temp 98.4°F | Resp 18 | Ht 61.02 in | Wt 189.0 lb

## 2018-11-18 DIAGNOSIS — M65331 Trigger finger, right middle finger: Secondary | ICD-10-CM | POA: Diagnosis present

## 2018-11-18 DIAGNOSIS — M1812 Unilateral primary osteoarthritis of first carpometacarpal joint, left hand: Secondary | ICD-10-CM | POA: Diagnosis not present

## 2018-11-18 DIAGNOSIS — S4991XA Unspecified injury of right shoulder and upper arm, initial encounter: Secondary | ICD-10-CM | POA: Insufficient documentation

## 2018-11-18 NOTE — Progress Notes (Signed)
[facility]  Rheumatology Follow-Up Patient Note    Date of Visit: 11/18/2018    Patient: Deborah [name]    PCP: Helane Gunther, APRN    Primary Language: [place] [name]  Interpreter: None.    Reason for Visit:  Osteoarthritis of hands, trigger finger, and elbow pain.  Last seen July 2020.    History of present illness:  Deborah [name] who is a 53 year old [place] and [place]- speaking female with bilateral knee osteoarthritis, nonalcoholic steatohepatitis, history of H. pylori gastritis in 2008 treatment, history of severe esophagitis seen on most recent EGD 2014, former smoker.    She was seen during initial consultation June 06, 2017 for evaluation of inflammatory arthritis in the setting of polyarthralgia and low titer rheumatoid factor of 1-2.  At the end of that encounter, polyarthralgia was deemed to be due to mechanical causes including bilateral knee osteoarthritis, right shoulder pain due to osteoarthritis versus rotator cuff tendinopathy; right lateral epicondylitis, CMC osteoarthritis and right hand third digit stenosing tenosynovitis.  During this initial visit, she received corticosteroid injection to the right subacromial bursa as well as the right third palmar aspect of the MCP of the hand for stenosing tenosynovitis.  Given her history of esophagitis and severe allergy to NSAIDs we recommended topical Voltaren gel.    She presents today in follow-up.  Left cmc injection only lasted for 10 days.  Worse in the last 4 days.  Take tylenol and uses voltaren gel does not find it helpful.    Right 3rd PIP triggering injection helped thankful.    Denies any fever, chills, chest pain or shortness of breath, cough, rhinorrhea or changes in senses of taste or smell.    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    Right shoulder a little better.     Rheumatologic Problem List:  1.  Bilateral  knee osteoarthritis.  2.  Right shoulder pain likely due to osteoarthritis versus rotator cuff tendinitis versus bursitis.  3.  Right lateral epicondylitis.  4.  Hand osteoarthritis, CMC osteoarthritis.  5.  Right third digit of the hand stenosing tenosynovitis.  6.  Low titer rheumatoid factor 1:2.  7.  NSAID allergy.    Past medical history:  Patient Active Problem List:     Lump or mass in breast     Gastroesophageal reflux disease without esophagitis     Other specified gastritis     Abdominal pain, epigastric     Angioneurotic edema not elsewhere classified     Plantar Fasciitis, L     Leg pain     Tobacco abuse, in remission     Triggering of Finger, R 3rd     Right subacromial bursitis/rotator cuff tendinopathy     Chondromalacia patellae     Lower back pain     De Quervain's Tenosynovitis, R     CTS (Carpal Tunnel Syndrome), b/l     Family history of diabetes mellitus (DM)     Hypolipoproteinemia     Intermittent spinal claudication (HCC)     Vitamin D deficiency     Greater Trochanteric Bursitis, b/l     DDD (degenerative disc disease), lumbar     Obesity     Rapid Noctural Palpitations x 10 Seconds     Sweating     Microscopic hematuria     Reflux esophagitis     Lump of skin of lower extremity  Bilateral chronic knee pain     Bilateral arm pain     Positive occult stool blood test     Chronic right-sided low back pain with right-sided sciatica     SVT To 240 BPM March 2010. As Of 2017, Episodes q 2 Weeks, Usually Subside One Minute, Occ 15 Minutes.  Commonly Relieved With Vagal Maneuvers.     BMI 33.0-33.9,adult     Rapid Palpitations Only Once/Month, Lasting Seconds, Abolished by Valsalva, Aug 2018.      Polyarthralgia     Macular chorioretinal scar of left eye     Hyperopia of both eyes with astigmatism and presbyopia     Osteoarthritis of both knees     Lateral epicondylitis of right elbow     Trigger middle finger of right hand     Osteoarthritis of carpometacarpal (CMC) joint of left thumb      Right shoulder injury    Past surgical history:  Past Surgical History:  No date: CESAREAN DELIVERY ONLY      Comment:  breech  No date: [place] BREAST SPECIMEN      Comment:  s/p fibroadenoma on the L,   No date: SEPTOPLASTY/SUBMUCOUS RESECJ W/WO CARTILAGE GRF    Allergies:  Review of Patient's Allergies indicates:   Naproxen                Swelling    Comment:Ed visit with angioedema   Ibuprofen               Other (See Comments)    Comment:Epigastric pain, throat swelling?             05/06/12 pt states uses sometimes without issue   Pollen extract-tree*    Runny Nose    Comment:HA, itchy watery eyes    Medications:    Current Outpatient Medications on File Prior to Visit:  Calcium Carb-Cholecalciferol (CALCIUM + D3) 600-200 MG-UNIT TABS TAKE 1 TABLET BY MOUTH TWO TIMES DAILY Disp: 60 tablet Rfl: 11   omeprazole (PRILOSEC) 20 MG capsule Take 1 capsule by mouth 2 (two) times daily before meals Disp: 60 capsule Rfl: 11   diclofenac (VOLTAREN) 1 % GEL Gel Apply to both hands, both elbows and both knees up to 4 times a day. Do Not Exceed 32 g in 24 Hours Disp: 100 g Rfl: 3   acetaminophen (TYLENOL) 500 MG tablet Take 500 mg by mouth every 6 (six) hours as needed for Pain Disp:  Rfl:    ibuprofen (ADVIL) 200 MG tablet Take 400 mg by mouth 2 (two) times daily with meals Disp:  Rfl:    cholecalciferol (VITAMIN D3) 2000 UNIT tablet Take 1 tablet by mouth daily Disp: 30 tablet Rfl: 11   metoprolol (TOPROL-XL) 100 MG 24 hr tablet Take 1 tablet by mouth daily Disp: 30 tablet Rfl: 11     No current facility-administered medications on file prior to visit.     Health Maintenance:  Last eye exam- 04/2017  Last dental exam- 5 months ago.   Last colonoscopy- NEVER  Last bone density- NEVER  PPD or QuantiFERON TB Gold- negative, but unsure of date    FECAL OCCULT BLOOD AGE 54+ due on 03/21/2018  LIPID SCREENING due on 10/09/2018  MAMMOGRAPHY due on 02/14/2019  INFLUENZA VACCINE(1) due on 12/10/2018  AWQ Questionnaire due on  04/23/2019  PEG SCORE (CHRONIC PAIN) due on 04/23/2019  PAP SMEAR due on 06/30/2019  HPV SCREENING due on 06/30/2019  HEALTH CARE PROXY due  on 04/23/2023  TETANUS VACCINE(2 - Td) due on 03/08/2026  HIV SCREENING Completed  HEP C SCREEN Completed  PHYSICAL EXAM Completed  PNEUMOCOCCAL VACCINE SERIES (< 65) Completed; No procedure found.    ROS:  Review of Systems: Constitutional, Eyes, ENT/Mouth, Cardiovascular, Respiratory, GI, GU, Neuro, Psych, Heme/Lymph, Skin, Musculoskeletal, and Endocrine systems were reviewed and are NEGATIVE, except for what is documented in the note.    PE:   11/18/18  1105   BP: 107/70   Pulse: 85   Resp: 18   Temp: 98.4 F (36.9 C)   SpO2: 98%   Weight: 85.7 kg (189 lb)   Height: 5' 1.02" (1.55 m)     Body mass index is 35.68 kg/m.  GENERAL: NAD. Normocephalic/Atraumatic. Alert and oriented to person, place, and time. Good eye contact. Full affect. Follows commands appropriately.  Well-groomed.    HEENT: Pupils were equal, round and reactive. Extraocular motions are intact. Mucous membranes are moist. There is no scalp rash. No malar rash. Anicteric sclerae. Non-injected conjunctivae. No nystagmus. Oropharynx clear. Tongue is midline. Symmetric palate raise.  No ulcerations. No exudate. No erythema.  Tympanic membranes are clear.  No parotid enlargement.   NECK: Supple. No appreciable cervical lymphadenopathy.  LUNGS: Clear to auscultation without wheezes, rhonchi, rales, or crackles.  CV: S1/S2 normal, without murmurs, gallops, or rubs.  ABDOMEN:  Bowel sounds in all four quadrants, soft, nontender, nondistended. No appreciable organomegaly.  EXTREMITIES: Warm and well-perfused without any sign of cyanosis, clubbing, or edema.  SKIN: No rashes, nodules, bruising, hyperpigmentation, hypopigmentation, telangiectasias, periungal erythema, splinter hemorrhages, sclerodermatous changes, icterus, petechiae, palpable purpura, tethering, striae, keloid, nail abnormalities or  tophi.    MUSCULOSKELETAL:   Hypertrophy of PIPs noted.   Grip strength is normal bilaterally.  Squaring of the left [facility] noted with associated tenderness on palpation..  No swelling in the hands.  No swelling in the wrists.  Unable to provoke feelings of pain or Tinel's.  No swelling in elbows. No swelling in the shoulders.  No restriction in range of motion in fingers, wrists, elbows, or shoulders.   No tender joints in the fingers, wrists, elbows or shoulders.    No pain over the sternoclavicular joint, AC joint or TMJs.   No restriction in ROM of the cervical spine.  No focal back tenderness.  Negative straight leg raise.  Negative Faber's.  Negative Schober's.  Negative logroll.  No restriction in range of motion in hips, knees, or ankles.   No swelling in the knees or ankles.  Bilateral valgus deformity of the knees with hypertrophic changes and crepitations.  No Achille's swelling or tenderness.  There is no tenderness on palpation of the MTPs.    No dactylitis or splaying of the toes.  NEURO: Cranial nerves II-XII intact. Patellar reflexes, Achilles reflexes and brachioradialis reflexes are 2/2 and symmetric. Narrow-based gait.  Power is 5/5 in all four limbs and without weakness. No difficulty getting up from seated position without using hands or out of a supine position.  No assistive ambulatory devices.    LABORATORY DATA:  Blood Counts  Lab Results   Component Value Date    WBC 10.4 04/06/2017    HGB 13.6 04/06/2017    HCT 40.8 04/06/2017    MCV 87.6 04/06/2017    PLTA 208 04/06/2017      Chemistries  Lab Results   Component Value Date    CA 8.9 12/04/2017    NA 143 12/04/2017    K 3.9  12/04/2017    CO2 28 12/04/2017    CL 107 12/04/2017    BUN 13 12/04/2017    CREAT 1.0 12/04/2017     CrCl cannot be calculated (Patient's most recent lab result is older than the maximum 7 days allowed.).    Liver Function Tests  Lab Results   Component Value Date    ALT 43 04/06/2017    AST 28 04/06/2017    ALBUMIN 4.2  04/06/2017    ALKPHOS 117 04/06/2017     Acute Phase Reactants  Lab Results   Component Value Date    ESR 12 01/31/2017    CRP 0.8 01/31/2017     Rheumatologic Labs    Lab Results   Component Value Date    RF POSITIVE (A) 01/31/2017   Rheumatid Factor (01/2017): 1:2    Lab Results   Component Value Date    CCPAB < 0.5 01/31/2017     Other Labs  Lab Results   Component Value Date    HGBA1C 5.2 06/30/2008     Lab Results   Component Value Date    TSHSC 1.380 02/07/2016     VITAMIN D,25 HYDROXY (ng/mL)   Date Value   01/31/2017 23 (L)     Lab Results   Component Value Date    CKTOTAL 95 06/16/2008     Lab Results   Component Value Date    LDL 129 10/08/2013     HEPATITIS B SURFACE ANTIGEN (no units)   Date Value   04/04/2006 NON-REACTIVE     HEPATITIS B SURFACE ANTIBODY (no units)   Date Value   04/04/2006 NEGATIVE     HEPATITIS C ANTIBODY (no units)   Date Value   04/04/2006 NEGATIVE     Imaging reviewed personally by me today:    Bilateral knee x-rays (12/11/2017): Consistent with osteoarthritis.      Left Hand Xray (February 2019):  Left hand x-ray consistent with osteoarthritis.     Chest Xray (11/2016):  No pneumothorax or pneumonia.     Right Ankle Xray (01/2015):  Lateral ankle soft tissue swelling. No fracture.     Bilateral Knee Xray (03/2013):  1. Tiny marginal osteophytes along the right medial tibiofemoral   compartment. 2. Normal alignment and preservation of the cartilage spaces.     Right Hand Xray (02/2008):  Unremarkable right hand radiographs.    Lumbar Xray (10/2007):  Normal alignment of the vertebral bodies. Vertebral body heights are well  maintained. An peritumoral disc spaces are normal. No acute fracture,  subluxation or dislocation. No evidence of facet disease. Evidence of   coarse interarticularis defect. SI joints are normal.    Assessment: Adrienne Trombetta is a 53 year old [place] speaking female with severe esophagitis, allergy to NSAID, CMC osteoarthritis,  hand also arthritis, bilateral knee osteoarthritis, chronic lower back pain likely degenerative disc disease.      Plan/Recommendations:  1.  Right third digit of the hand stenosing tenosynovitis.   Currently resolved with last injectin.    2.  Left CMC osteoarthritis.  NO improvement with injectio.  Advised her to continue use of Voltaren gel and Tylenol up to 3 g daily.  Avoid oral NSAIDs.  Referred to occupational therapy.  Offered appointment with Hand Surgery.  Would like to hold off for now. Consider hydroxychloroquine for hand osteoarthritis if not interested in surgery.    3.  Right shoulder pain is symptomatically better. Referred to physical therapy.  Will hold on  injection for now.    4.  History of NSAID allergy with what sounds like angioedema.  I did advise the patient that she should avoid oral NSAIDs given previous history of allergic reaction.    HEALTH MAINTENANCE: N/A  EDUCATION:  The total face to face time with patient was 15 minutes.  Greater than 50% of that time was spent on counseling/educating patient regarding potential etiologies, pathophysiology, treatment options, need for further work-up including blood work, imaging and referrals, reviewing previous lab results and radiology findings, reviewing internal and external medical records, and coordinating medical care, outside of the time needed to perform any procedures.      REMINDERS FOR NEXT VISIT: N/A    I discussed the likely diagnosis(es),  prognosis(es), and the various treatment options in detail with the patient, including observation.     Risks and benefits of the treatment plan(s) were discussed.     The patient's questions have been addressed and answered.     We discussed importance of medication compliance.     The patient was ready to learn and no apparent learning barriers were identified.     Follow-up in 4 months face to face, earlier if needed.    Deborah [name] verbally expressed an understanding and agreement  with the plan as outlined above.    This dictation was made using voice recognition software.  There is potential for transcription errors and incorrect word usage based on limitations of the software.      Tyah Acord Adolf-Ubokudom, MD, Jul[facility] Payer.

## 2018-11-26 ENCOUNTER — Ambulatory Visit (HOSPITAL_BASED_OUTPATIENT_CLINIC_OR_DEPARTMENT_OTHER): Payer: No Typology Code available for payment source | Admitting: Rehabilitative and Restorative Service Providers"

## 2018-11-26 DIAGNOSIS — G8929 Other chronic pain: Secondary | ICD-10-CM | POA: Insufficient documentation

## 2018-11-26 DIAGNOSIS — S4991XA Unspecified injury of right shoulder and upper arm, initial encounter: Secondary | ICD-10-CM | POA: Insufficient documentation

## 2018-11-26 DIAGNOSIS — M25511 Pain in right shoulder: Secondary | ICD-10-CM | POA: Insufficient documentation

## 2018-11-26 NOTE — Progress Notes (Signed)
Outpatient Physical Therapy Initial Evaluation  [facility]: [facility]        HPI:  Deborah [name] is a 53 year old female who presents to [facility] with complaints of R shoulder pain.  She reports chronic pain @x  @ 2 years with no known cause.and states symptoms have been about the same.       Date of Onset: Chronic @ 2 years ago        Patient learns best by no preference stated.      Precautions       Pain:  Today:  7/10  Best:  0    Alleviating Factors: ice tylenol and medicated gel  Worst:   Provocative Factors: bending lifting carrying - gradual increase through course of day and with work duties  Descriptor/Location:  Intermittent sharp pain R shoulder to neck    Neuro:        Bowl/Bladder Changes    Imaging:  None of R shoulder    Function  ADLs:  Pt reports pain with household chores - grocery shopping - unable to lift gallon of milk without pain  States she manages self care independently  Uses left hand for driving    Lives with 51 year old son and parents    Work/School Duties:  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:  reorts some pain and numbness in R shoulder     Recreational Activities:  Without regular exercise program    Patient's Goals for Physical Therapy:  To decrease pain improve tolerance to ADL's and work duties      Patient Active Problem List:  Patient Active Problem List:     Lump or mass in breast     Gastroesophageal reflux disease without esophagitis     Other specified gastritis     Abdominal pain, epigastric     Angioneurotic edema not elsewhere classified     Plantar Fasciitis, L     Leg pain     Tobacco abuse, in remission     Triggering of Finger, R 3rd     Right subacromial bursitis/rotator cuff tendinopathy     Chondromalacia patellae     Lower back pain     De Quervain's Tenosynovitis, R     CTS (Carpal Tunnel Syndrome), b/l     Family history of diabetes mellitus (DM)     Hypolipoproteinemia     Intermittent spinal  claudication (HCC)     Vitamin D deficiency     Greater Trochanteric Bursitis, b/l     DDD (degenerative disc disease), lumbar     Obesity     Rapid Noctural Palpitations x 10 Seconds     Sweating     Microscopic hematuria     Reflux esophagitis     Lump of skin of lower extremity     Bilateral chronic knee pain     Bilateral arm pain     Positive occult stool blood test     Chronic right-sided low back pain with right-sided sciatica     SVT To 240 BPM March 2010. As Of 2017, Episodes q 2 Weeks, Usually Subside One Minute, Occ 15 Minutes.  Commonly Relieved With Vagal Maneuvers.     BMI 33.0-33.9,adult     Rapid Palpitations Only Once/Month, Lasting Seconds, Abolished by Valsalva, Aug 2018.      Polyarthralgia     Macular chorioretinal scar of left eye     Hyperopia  of both eyes with astigmatism and presbyopia     Osteoarthritis of both knees     Lateral epicondylitis of right elbow     Trigger middle finger of right hand     Osteoarthritis of carpometacarpal (CMC) joint of left thumb     Right shoulder injury        Medications:    Current Outpatient Medications on File Prior to Visit:  Calcium Carb-Cholecalciferol (CALCIUM + D3) 600-200 MG-UNIT TABS TAKE 1 TABLET BY MOUTH TWO TIMES DAILY Disp: 60 tablet Rfl: 11   omeprazole (PRILOSEC) 20 MG capsule Take 1 capsule by mouth 2 (two) times daily before meals Disp: 60 capsule Rfl: 11   diclofenac (VOLTAREN) 1 % GEL Gel Apply to both hands, both elbows and both knees up to 4 times a day. Do Not Exceed 32 g in 24 Hours Disp: 100 g Rfl: 3   acetaminophen (TYLENOL) 500 MG tablet Take 500 mg by mouth every 6 (six) hours as needed for Pain Disp:  Rfl:    cholecalciferol (VITAMIN D3) 2000 UNIT tablet Take 1 tablet by mouth daily Disp: 30 tablet Rfl: 11   metoprolol (TOPROL-XL) 100 MG 24 hr tablet Take 1 tablet by mouth daily Disp: 30 tablet Rfl: 11     No current facility-administered medications on file prior to visit.           Safe at Home   Yes       11/26/18 1500    Language Information   Language of [facility] [place]   Visit   Visit number scrren - history taken   Time Calculation   Start Time 1440   Stop Time 1520   Time Calculation (min) 40 min   Pain   Pain Score 6    Patient Education   What was taught? PT POC in clinic visit on 12/02/18   Method Verbal   Patient comprehension Yes               Gypsy Decant, PT, Lic # [id number]

## 2018-12-02 ENCOUNTER — Ambulatory Visit
Payer: No Typology Code available for payment source | Attending: Rehabilitative and Restorative Service Providers" | Admitting: Rehabilitative and Restorative Service Providers"

## 2018-12-02 ENCOUNTER — Encounter (HOSPITAL_BASED_OUTPATIENT_CLINIC_OR_DEPARTMENT_OTHER): Payer: Self-pay | Admitting: Internal Medicine

## 2018-12-02 ENCOUNTER — Other Ambulatory Visit: Payer: Self-pay

## 2018-12-02 DIAGNOSIS — G8929 Other chronic pain: Secondary | ICD-10-CM | POA: Diagnosis present

## 2018-12-02 DIAGNOSIS — M25511 Pain in right shoulder: Secondary | ICD-10-CM | POA: Diagnosis present

## 2018-12-02 DIAGNOSIS — S4991XA Unspecified injury of right shoulder and upper arm, initial encounter: Secondary | ICD-10-CM | POA: Diagnosis present

## 2018-12-02 NOTE — Progress Notes (Signed)
Outpatient Physical Therapy Initial Evaluation  [facility]: [facility]        HPI:  Deborah [name] is a 53 year old female who presents to [facility] with complaints of R shoulder pain.  She reports chronic pain @x  @ 2 years with no known cause.and states symptoms have been about the same.       Date of Onset: Chronic @ 2 years ago        Patient learns best by no preference stated.      Precautions       Pain:  Today:  7/10  Best:  0    Alleviating Factors: ice tylenol and medicated gel  Worst:   Provocative Factors: bending lifting carrying - gradual increase through course of day and with work duties  Descriptor/Location:  Intermittent sharp pain R shoulder to neck    Neuro:        Bowl/Bladder Changes    Imaging:  None of R shoulder    Function  ADLs:  Pt reports pain with household chores - grocery shopping - unable to lift gallon of milk without pain  States she manages self care independently  Uses left hand for driving    Lives with 51 year old son and parents    Work/School Duties:  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:  reorts some pain and numbness in R shoulder     Recreational Activities:  Without regular exercise program    Patient's Goals for Physical Therapy:  To decrease pain improve tolerance to ADL's and work duties      Patient Active Problem List:  Patient Active Problem List:     Lump or mass in breast     Gastroesophageal reflux disease without esophagitis     Other specified gastritis     Abdominal pain, epigastric     Angioneurotic edema not elsewhere classified     Plantar Fasciitis, L     Leg pain     Tobacco abuse, in remission     Triggering of Finger, R 3rd     Right subacromial bursitis/rotator cuff tendinopathy     Chondromalacia patellae     Lower back pain     De Quervain's Tenosynovitis, R     CTS (Carpal Tunnel Syndrome), b/l     Family history of diabetes mellitus (DM)     Hypolipoproteinemia     Intermittent spinal  claudication (HCC)     Vitamin D deficiency     Greater Trochanteric Bursitis, b/l     DDD (degenerative disc disease), lumbar     Obesity     Rapid Noctural Palpitations x 10 Seconds     Sweating     Microscopic hematuria     Reflux esophagitis     Lump of skin of lower extremity     Bilateral chronic knee pain     Bilateral arm pain     Positive occult stool blood test     Chronic right-sided low back pain with right-sided sciatica     SVT To 240 BPM March 2010. As Of 2017, Episodes q 2 Weeks, Usually Subside One Minute, Occ 15 Minutes.  Commonly Relieved With Vagal Maneuvers.     BMI 33.0-33.9,adult     Rapid Palpitations Only Once/Month, Lasting Seconds, Abolished by Valsalva, Aug 2018.      Polyarthralgia     Macular chorioretinal scar of left eye     Hyperopia  of both eyes with astigmatism and presbyopia     Osteoarthritis of both knees     Lateral epicondylitis of right elbow     Trigger middle finger of right hand     Osteoarthritis of carpometacarpal (CMC) joint of left thumb     Right shoulder injury     Chronic right shoulder pain        Medications:    Current Outpatient Medications on File Prior to Visit:  Calcium Carb-Cholecalciferol (CALCIUM + D3) 600-200 MG-UNIT TABS TAKE 1 TABLET BY MOUTH TWO TIMES DAILY Disp: 60 tablet Rfl: 11   omeprazole (PRILOSEC) 20 MG capsule Take 1 capsule by mouth 2 (two) times daily before meals Disp: 60 capsule Rfl: 11   diclofenac (VOLTAREN) 1 % GEL Gel Apply to both hands, both elbows and both knees up to 4 times a day. Do Not Exceed 32 g in 24 Hours Disp: 100 g Rfl: 3   acetaminophen (TYLENOL) 500 MG tablet Take 500 mg by mouth every 6 (six) hours as needed for Pain Disp:  Rfl:    cholecalciferol (VITAMIN D3) 2000 UNIT tablet Take 1 tablet by mouth daily Disp: 30 tablet Rfl: 11   metoprolol (TOPROL-XL) 100 MG 24 hr tablet Take 1 tablet by mouth daily Disp: 30 tablet Rfl: 11     No current facility-administered medications on file prior to visit.           Safe at Home    Yes       12/02/18 1600   Language Information   Language of Care English   Interpreter No   Evaluation Type   Evaluation Type Initial Evaluation   Rehab Discipline   Rehab Discipline PT   Visit   Visit number PT eval   POC Due date by 01/02/19   Pain   Pain Score 4    Pain Type Chronic pain   Pain Location Shoulder   Pain Orientation Right   Patient Stated Goals   Patient stated goals to decrease pain improve tolerance to ADL's amd work duties   Living Situation   Lives With [employer]   Forward Head Minimal   Rounded Shoulders Moderate   R Elevated Shoulder  Minimal   Kyphosis Moderate   Lordosis Minimal   Cervical Assessment   Flexion 50 - some stretching   Extension 50    L Rotation full no pain   R Rotation 80% R pulling / stretch   L Side bend 40   R Side bend 40   LUE AROM (degrees)   Overall AROM WFL   LUE Strength   L Shoulder Flexion 5/5   L Shoulder Extension 5/5   L Shoulder ABduction 5/5   L Shoulder Internal Rotation 4+/5   L Shoulder External Rotation 4+/5   L Shoulder Horizontal ABduction 4+/5   RUE AROM (degrees)   RUE Overall WFL except for IR to L4   RUE Strength   R Shoulder Flexion 4+/5   R Shoulder Extension 4+/5   R Shoulder ABduction 4+/5   R Shoulder Internal Rotation 4+/5   R Shoulder External Rotation 4/5   R Shoulder Horizontal ABduction 4/5   R Shoulder Horizontal ADduction 4/5   Cervical Results   Spurling's  Negative   Compression  Negative   Palpation   Tenderness to Palpation R biceps    Increased Tissue Density B UT R deltoid   Patient Education   What was taught? HEP PT  POC   Method Verbal;Practice;Written;Demo   Patient comprehension Yes         PT  Physical Therapy Plan of Care    YB:[id number] Curtin, APRN  Referring Provider: Dr Ervin Knack L. Margaretmary Dys - [place]  Diagnosis: Chronic right shoulder pain  (primary encounter diagnosis)   Chronic right shoulder pain [M25.511, G89.29]      Assessment/Objective Findings:   Patient is a 53 year old female with complains of pain in her right  Shoulder     Clinical presentation today is consistent with chronic R shoulder pain with altered mechanics and impingement  based on evaluation. Contributing factors include postural changes and work duties. Pt will benefit from skilled PT focused on manual therapy and exercise program to address the following problems and impairments noted upon evaluation: Pain, Decreased ROM, Decreased Strength, Decreased Functional Mobility, Decreased Joint Mobility and Decreased Tolerance of ADLs.     These problems limit the patient with the following functional activities: reaching lifting and carrying with R UE. The prescribed treatment plan of care is medically necessary.      Co-morbidities of chronic LBP were identified and taken into considerations of plan of care.      Short Term Functional Goals: 4 weeks.   Pt will report decrease in average pain level without medication to < 5  Pt will be independent with initial HEP  Pt will demonstrate good postural awareness for lifting technique with scapula control    Long Term Goal: 8 weeks.   Pt will be independent with HEP for strengthening and stabilization exercises  Pt will report 0-3 pain with all ADL's and work duties  Pt will have improved postural awareness  Pt will have at least 4+/t R shoulder and scapula strength      Treatment Plan:  ** PT Eval - Low Complexity (CPT 97161)  ** Stretching/ROM/Therapeutic Exercise (97110)  ** Home Exercise Program/ Patient Education (CPT [phone])  ** Neuromuscular Re-education (CPT [phone])  ** Manual Therapy / Joint / Soft tissue Mobilization (CPT 97140)  ** Functional Activities (CPT 97530)  ** taping prn    Recommend Physical Therapy be continued 1 times per week for 8 weeks. Telehealth to in clinic visits prn  The rehabilitation potential for this patient is good with an low complex  stable clinical presentation due to above listed barriers, functional limitations, and clinical exam.    Patient Regis Bill is aware of attendance policy:  Yes  Plan of care discussed with Patient/Family: Yes  Patient goals reviewed and incorporated in plan of care: Yes  Patient/Family agrees with plan of care: Yes  Patient/Family education: Yes  Does patient feel safe at home: Yes            Gypsy Decant, PT, Lic # [id number]

## 2018-12-02 NOTE — Progress Notes (Signed)
12/02/18 1600   Language Information   Language of Care English   Interpreter No   Rehab Discipline   Rehab Discipline PT   Visit   Visit number PT eval   Time Calculation   Start Time 1530   Stop Time 1605   Time Calculation (min) 35 min   Pain   Pain Score 4    Pain Type Chronic pain   Pain Location Shoulder   Pain Orientation Right   Ther Exercise   Exercise ER with YTB - RTB   Ther Exercise 2   Exercise wall slide with ER activation   Ther Exercise 3   Exercise 3 scapula clocks   Other   Other Treatments Miscellaneous   Miscellaneous KT - I strip for scapula retraction, deltoid support and A-P GHJ   Patient Education   What was taught? HEP PT [place], PT, Lic # [id number]

## 2018-12-03 ENCOUNTER — Ambulatory Visit: Payer: No Typology Code available for payment source | Attending: Occupational Therapy | Admitting: Occupational Therapy

## 2018-12-03 ENCOUNTER — Other Ambulatory Visit: Payer: Self-pay

## 2018-12-03 DIAGNOSIS — M19032 Primary osteoarthritis, left wrist: Secondary | ICD-10-CM | POA: Insufficient documentation

## 2018-12-03 NOTE — Progress Notes (Signed)
Pt seen in OT today for fabrication of L CMC splint secondary to OA.  Pt instructed in wear, care and precautions with splint.  Educated on OA and recommended activity mod to decrease CMC stress.  Pt will return for OT eval 12/13/18 and dynamic thumb stabilization exercises.    Leisa Lenz, [place], [place] # [id number]

## 2018-12-03 NOTE — Progress Notes (Signed)
I certify that the documented Treatment Plan is reasonable and necessary.    12/03/2018  Leia Coletti L Adolf-[place]ok[place]om, MD

## 2018-12-09 ENCOUNTER — Encounter (HOSPITAL_BASED_OUTPATIENT_CLINIC_OR_DEPARTMENT_OTHER): Payer: No Typology Code available for payment source | Admitting: Internal Medicine

## 2018-12-09 ENCOUNTER — Ambulatory Visit (HOSPITAL_BASED_OUTPATIENT_CLINIC_OR_DEPARTMENT_OTHER): Payer: No Typology Code available for payment source | Admitting: Rehabilitative and Restorative Service Providers"

## 2018-12-12 ENCOUNTER — Other Ambulatory Visit: Payer: Self-pay

## 2018-12-13 ENCOUNTER — Ambulatory Visit: Payer: No Typology Code available for payment source | Attending: Internal Medicine | Admitting: Occupational Therapy

## 2018-12-13 DIAGNOSIS — M19032 Primary osteoarthritis, left wrist: Secondary | ICD-10-CM | POA: Diagnosis present

## 2018-12-13 DIAGNOSIS — M1812 Unilateral primary osteoarthritis of first carpometacarpal joint, left hand: Secondary | ICD-10-CM | POA: Diagnosis present

## 2018-12-17 NOTE — Progress Notes (Signed)
Outpatient OT Initial Evaluation    PMHx and medications:  Reviewed       12/13/18 1530   Language Information   Language of Care English   Interpreter No   Rehab Discipline   Rehab Discipline OT   Visit   Visit number OT eval   POC Due date 01/12/19   Pain   Pain Score 6    Pain Type Chronic pain   Pain Location Hand  (thumb)   Pain Orientation Left   Services prior to admission?   Type of Home Care Services None   Premorbid Vocational   Occupation house cleaner   Living Situation   Lives With Family   Patient Goals   Patient stated goal decrease pain thumb   Strengths   Strengths Previous rehab experience;Attitude of self;Ability to acquire knowledge   Barriers   Barriers Comorbidities   Written Expression   Dominant Hand Right   Sensation   Light Touch No apparent deficits   Sharp/Dull No apparent deficits   Proprioception   Proprioception deficits? No apparent deficits   IADL / ADL   IADL/ADL   (Ind - painful)   Procedures   Procedures No   RUE Assessment   RUE Assessment WFL   R Hand Evaluation   R Hand Evaluation WFL   L Hand Evaluation   L Hand Evaluation X   Left Hand AROM   L Thumb MCP 0-60 35 Degrees   L Thumb IP 0-80 55 Degrees   L Thumb Radial ADduction/ABduction 0-55 45   Clinical Special Tests   Special Tests No   Functional Activities/Ergonomics   Functional Activities Assessment/Ergonomics X   Lifting Impaired  (L)   Household Chores Required assistance  (heavy pots/pans)   Palpation   Tenderness to Palpation mod L CMC/MP   Joint Mobility   Joint Mobility Assessment   [facility])   Neurological    Neurological Assessment No   Splint   Splint Assessment Yes  (fab 12/03/18)   Hand Based [facility]   Skin Integrity Assessement   Skin Integrity Assessment   (intact)   Edema (cm or ml)   Edema Assessment (cm or ml)   (mod CMC)   Coordination   Gross Motor Performance Observation Stiff   Perceptual/Fine Motor Skills Difficulty with laces/buttons/zippers   Dexterity WFL   In Hand Manipulation decreased L   Plan    Prognosis Good   OT Frequency   (1-2 f/u televisits)   Patient Education   What was taught? HEP, splints, activity mods   Method Verbal;Demo;Practice;Written   Patient comprehension Yes       OT  Occupational Therapy Plan of Care    [id number] Curtin, APRN  Referring Provider:   Joya Martyr Adolf-[place], MD  Diagnosis: No diagnosis found.    Assessment/Objective Findings: Patient is a 53 year old female who reports multi year h/o L hand OA, which did not improve with steroid injection.  Treating with Tylenol and Voltaren gel.  Fitted with CMC splint last week in OT and here today for evaluation.    Pt with tightness of 1st webspace and moderate shoulder sign L thumb.  Pain at [facility] and MP joints.  Splint irritating thumb at MP crease and is remolded today for comfort.    Pt educated re dx and activity modification to decrease stress to [facility] joint.  Instructed in dynamic stabilization exercises, including AP release, web space stretch, distraction and isometric grip/pinch strength.    Patient will  benefit from skilled OT to address the rehabilitation goals outlined below.     Rehabilitation Goals  Patient to be Independent with Home Exercise Program.  Duration: 2 weeks  Average pain will be reduced from a 6/10 to a 3/10. Duration: 4 weeks  Pt will id 2 examples activity mods to decrease CMC stress:  4 weeks    Long Term Goal: Decrease pain and improve pt ability to self-manage condition    Treatment Plan: ** OT Eval - Low Complexity (CPT 97165)  ** Stretching/ROM/Therapeutic Exercise (97110)  ** Home Exercise Program/ Patient Education (CPT [phone])  ** Splinting    Recommend Occupational Therapy be continued 1-2 follow up televisits for HEP.    The rehabilitation potential for this patient is good    Patient is agreeable to treatment plan and is aware of attendance policy.    Leisa Lenz, OTR/L, CHT  12/13/18  Leisa Lenz, [place], Lic # [id number]

## 2018-12-18 NOTE — Progress Notes (Signed)
I certify that the documented Treatment Plan is reasonable and necessary.    12/18/2018  Tabetha Haraway L Adolf-[place], MD

## 2018-12-23 ENCOUNTER — Other Ambulatory Visit (HOSPITAL_BASED_OUTPATIENT_CLINIC_OR_DEPARTMENT_OTHER): Payer: Self-pay | Admitting: Internal Medicine

## 2018-12-23 NOTE — Progress Notes (Unsigned)
PER Pharmacy, Deborah [name] is a 53 year old female has requested a refill of Metoprolol.      Last Office Visit: 11/26/18 with Rayburn Gilman  Last Physical Exam: 03/13/17    FECAL OCCULT BLOOD AGE 85+ due on 03/21/2018    Other Med Adult:  Most Recent BP Reading(s)  11/18/18 : 107/70        Cholesterol (mg/dL)   Date Value   10/08/2013 192     LOW DENSITY LIPOPROTEIN DIRECT (mg/dL)   Date Value   10/08/2013 129     HIGH DENSITY LIPOPROTEIN (mg/dL)   Date Value   10/08/2013 48     TRIGLYCERIDES (mg/dL)   Date Value   10/08/2013 223 (H)         THYROID SCREEN TSH REFLEX FT4 (uIU/mL)   Date Value   02/07/2016 1.380         TSH (THYROID STIM HORMONE) (uIU/mL)   Date Value   12/22/2010 1.31       HEMOGLOBIN A1C (%)   Date Value   06/30/2008 5.2       No results found for: POCA1C      INR (no units)   Date Value   06/15/2008 1.0 (L)       SODIUM (mmol/L)   Date Value   12/04/2017 143       POTASSIUM (mmol/L)   Date Value   12/04/2017 3.9           CREATININE (mg/dL)   Date Value   12/04/2017 1.0       Documented patient preferred pharmacies:    [place], [place] - [place].  Phone: [phone] Fax: [phone]

## 2018-12-24 ENCOUNTER — Other Ambulatory Visit (HOSPITAL_BASED_OUTPATIENT_CLINIC_OR_DEPARTMENT_OTHER): Payer: Self-pay | Admitting: Internal Medicine

## 2018-12-24 ENCOUNTER — Ambulatory Visit (HOSPITAL_BASED_OUTPATIENT_CLINIC_OR_DEPARTMENT_OTHER): Payer: No Typology Code available for payment source | Admitting: Rehabilitative and Restorative Service Providers"

## 2018-12-24 DIAGNOSIS — G8929 Other chronic pain: Secondary | ICD-10-CM

## 2018-12-24 DIAGNOSIS — M25511 Pain in right shoulder: Secondary | ICD-10-CM | POA: Insufficient documentation

## 2018-12-24 DIAGNOSIS — S4991XA Unspecified injury of right shoulder and upper arm, initial encounter: Secondary | ICD-10-CM | POA: Diagnosis not present

## 2018-12-24 MED ORDER — METOPROLOL SUCCINATE ER 100 MG PO TB24
100.0000 mg | ORAL_TABLET | Freq: Every day | ORAL | 3 refills | Status: DC
Start: 2018-12-24 — End: 2019-12-23

## 2018-12-24 NOTE — Progress Notes (Unsigned)
Regarding: Dr.Risser  RE: refill for Metoprolol      Patient's Preferred Pharmacy:   [facility] [place], [place].  Phone: [phone] Fax: [phone]    Pt is calling again to request a refill for Metoprolol. Msg sent to Dr. Dillon Bjork for request.     Roel Cluck, RN, 12/24/2018

## 2018-12-24 NOTE — Progress Notes (Unsigned)
Pt called and informed refill for Metoprolol sent.   Kelseigh Diver Colon-Guerrero, RN, 12/24/2018

## 2018-12-24 NOTE — Telephone Encounter (Signed)
-----   Message from [facility] sent at 12/24/2018  2:17 PM EDT -----  Regarding: [place] [id number], 53 year old, female, Telephone Information:   Home Phone      [phone]  Work Phone      Not on file.  Mobile          [phone]      Patient's Preferred Pharmacy:     [place], [place].  Phone: [phone] Fax: [phone]      CONFIRMED TODAY: Christene Lye NUMBER: [phone]  Best time to call back: anytime  Cell phone:   Other phone:    Available times:    Patient's language of care: [place]    Patient does not need an interpreter.    Patient's PCP: Helane Gunther, APRN    Person calling on behalf of patient: Patient (self)    Calls today to speak to nurse only.  Pt needs a new prescription refill. Please call pt back for more details.

## 2018-12-24 NOTE — Progress Notes (Signed)
Chronic right shoulder pain [M25.511, G89.29]    S: Pt reports she had good relief with tape for @ 5 days after clinic visit  Feels better overall continuing with HEP    O: Refer to Rehabilitation Treatment Flowsheet   12/24/18 1600   Language Information   Language of Care English   Interpreter No   Rehab Discipline   Rehab Discipline PT   Visit   Visit number 2   POC Due date 01/02/19   Time Calculation   Start Time 1546   Stop Time 1617   Time Calculation (min) 31 min   Ther Exercise   Exercise verbal instruction in IR with RTB   Ther Exercise 2   Exercise verbal instruction in prone Y and T lifts   Patient Education   What was taught? plan for clinic visit 01/06/19   Method Verbal;Written  (HEP sent via email pt to check for receipt)   Patient comprehension Yes       A: Pt was unable to connect via google meet or view HEP states she is not at home and will check home computer.  Verbal instruction in new exercise program and patient advised to contact PT for any questions prior to scheduled clinic follow up on 9/28     P: Reassess in clinic on 9/28 visit for review and progression of HEP      Gypsy Decant, PT, Lic # [id number]

## 2018-12-25 ENCOUNTER — Ambulatory Visit (HOSPITAL_BASED_OUTPATIENT_CLINIC_OR_DEPARTMENT_OTHER): Payer: No Typology Code available for payment source | Admitting: Occupational Therapy

## 2019-01-02 ENCOUNTER — Telehealth (HOSPITAL_BASED_OUTPATIENT_CLINIC_OR_DEPARTMENT_OTHER): Payer: Self-pay | Admitting: Occupational Therapy

## 2019-01-03 ENCOUNTER — Other Ambulatory Visit: Payer: Self-pay

## 2019-01-06 ENCOUNTER — Ambulatory Visit
Payer: No Typology Code available for payment source | Attending: Rehabilitative and Restorative Service Providers" | Admitting: Rehabilitative and Restorative Service Providers"

## 2019-01-06 DIAGNOSIS — M25511 Pain in right shoulder: Secondary | ICD-10-CM | POA: Diagnosis present

## 2019-01-06 DIAGNOSIS — G8929 Other chronic pain: Secondary | ICD-10-CM | POA: Diagnosis present

## 2019-01-06 DIAGNOSIS — S4991XA Unspecified injury of right shoulder and upper arm, initial encounter: Secondary | ICD-10-CM | POA: Diagnosis present

## 2019-01-06 NOTE — Progress Notes (Signed)
01/06/19 1600   Language Information   Language of Care English   Interpreter No   Rehab Discipline   Rehab Discipline PT   Visit   Visit number 3   POC Due date by 02/05/19   Time Calculation   Start Time 1500   Stop Time 1530   Time Calculation (min) 30 min   Pain   Pain Score 4    Manual Therapy   Technique scapula mobs   Ther Exercise   Exercise ER with GTB   Reps 10   Ther Exercise 2   Exercise IR with BTB   Ther Exercise 3   Exercise 3 lateral wall walks with RTB loop   Ther Exercise 4   Exercise 4 wall walks with YTB loop   Other   Miscellaneous KT - I strip for scapula retraction, and A-P GHJ   Patient Education   What was taught? HEP  POC   Method Verbal;Demo;Practice;Written   Patient comprehension Yes     Gypsy Decant, PT, Lic # [id number]

## 2019-01-06 NOTE — Progress Notes (Signed)
Outpatient Physical Therapy Reassessment  [facility]: [facility]        HPI:  Deborah [name] is a 53 year old female who presents to [facility] with complaints of R shoulder pain.  She reports chronic pain @x  @ 2 years with no known cause.and states symptoms have been about the same.     01/06/2019  Pt was only seen for 1 televisit since starting PT on 12/02/18  She does report overall improvment in pain level, but still with incresae in pain with work duties driving and lifting.          Date of Onset: Chronic @ 2 years ago        Patient learns best by no preference stated.      Precautions       Pain:  Today:  4  Best:  0    Alleviating Factors: ice tylenol and medicated gel  Worst:   Provocative Factors: bending lifting carrying - gradual increase through course of day and with work duties  Descriptor/Location:  Intermittent sharp pain R shoulder to neck    Neuro:    Negative cervical compression and Spurlings test    Bowl/Bladder Changes    Imaging:  None of R shoulder    Function  ADLs:  Pt reports pain with household chores - grocery shopping - unable to lift gallon of milk without pain  States she manages self care independently  Uses left hand for driving    Lives with 1 year old son and parents    Work/School Duties:  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:  reorts some pain and numbness in R shoulder     Recreational Activities:  Without regular exercise program    Patient's Goals for Physical Therapy:  To decrease pain improve tolerance to ADL's and work duties      Patient Active Problem List:  Patient Active Problem List:     Lump or mass in breast     Gastroesophageal reflux disease without esophagitis     Other specified gastritis     Abdominal pain, epigastric     Angioneurotic edema not elsewhere classified     Plantar Fasciitis, L     Leg pain     Tobacco abuse, in remission     Triggering of Finger, R 3rd     Right subacromial  bursitis/rotator cuff tendinopathy     Chondromalacia patellae     Lower back pain     De Quervain's Tenosynovitis, R     CTS (Carpal Tunnel Syndrome), b/l     Family history of diabetes mellitus (DM)     Hypolipoproteinemia     Intermittent spinal claudication (HCC)     Vitamin D deficiency     Greater Trochanteric Bursitis, b/l     DDD (degenerative disc disease), lumbar     Obesity     Rapid Noctural Palpitations x 10 Seconds     Sweating     Microscopic hematuria     Reflux esophagitis     Lump of skin of lower extremity     Bilateral chronic knee pain     Bilateral arm pain     Positive occult stool blood test     Chronic right-sided low back pain with right-sided sciatica     SVT To 240 BPM March 2010. As Of 2017, Episodes q 2 Weeks, Usually Subside One Minute, Occ 15 Minutes.  Commonly  Relieved With Vagal Maneuvers.     BMI 33.0-33.9,adult     Rapid Palpitations Only Once/Month, Lasting Seconds, Abolished by Valsalva, Aug 2018.      Polyarthralgia     Macular chorioretinal scar of left eye     Hyperopia of both eyes with astigmatism and presbyopia     Osteoarthritis of both knees     Lateral epicondylitis of right elbow     Trigger middle finger of right hand     Osteoarthritis of carpometacarpal (CMC) joint of left thumb     Right shoulder injury     Chronic right shoulder pain        Medications:  metoprolol (TOPROL-XL) 100 MG 24 hr tablet, Take 1 tablet by mouth daily, Disp: 90 tablet, Rfl: 3  Calcium Carb-Cholecalciferol (CALCIUM + D3) 600-200 MG-UNIT TABS, TAKE 1 TABLET BY MOUTH TWO TIMES DAILY, Disp: 60 tablet, Rfl: 11  omeprazole (PRILOSEC) 20 MG capsule, Take 1 capsule by mouth 2 (two) times daily before meals, Disp: 60 capsule, Rfl: 11  diclofenac (VOLTAREN) 1 % GEL Gel, Apply to both hands, both elbows and both knees up to 4 times a day. Do Not Exceed 32 g in 24 Hours, Disp: 100 g, Rfl: 3  acetaminophen (TYLENOL) 500 MG tablet, Take 500 mg by mouth every 6 (six) hours as needed for Pain, Disp: ,  Rfl:   cholecalciferol (VITAMIN D3) 2000 UNIT tablet, Take 1 tablet by mouth daily, Disp: 30 tablet, Rfl: 11    No current facility-administered medications on file prior to visit.             Safe at Home   Yes    Pt with only 1 televisit since starting PT  No significant change in posture  She does have full AROM of R shoulder with some discomfort at end range of flexion and IR       12/02/18 1600   Language Information   Language of Care English   Interpreter No   Evaluation Type   Evaluation Type Initial Evaluation   Rehab Discipline   Rehab Discipline PT   Visit   Visit number PT eval   POC Due date by 01/02/19   Pain   Pain Score 4    Pain Type Chronic pain   Pain Location Shoulder   Pain Orientation Right   Patient Stated Goals   Patient stated goals to decrease pain improve tolerance to ADL's amd work duties   Living Situation   Lives With [employer]   Forward Head Minimal   Rounded Shoulders Moderate   R Elevated Shoulder  Minimal   Kyphosis Moderate   Lordosis Minimal   Cervical Assessment   Flexion 50 - some stretching   Extension 50    L Rotation full no pain   R Rotation 80% R pulling / stretch   L Side bend 40   R Side bend 40   LUE AROM (degrees)   Overall AROM WFL   LUE Strength   L Shoulder Flexion 5/5   L Shoulder Extension 5/5   L Shoulder ABduction 5/5   L Shoulder Internal Rotation 4+/5   L Shoulder External Rotation 4+/5   L Shoulder Horizontal ABduction 4+/5   RUE AROM (degrees)   RUE Overall WFL except for IR to L4   RUE Strength   R Shoulder Flexion 4+/5   R Shoulder Extension 4+/5   R Shoulder ABduction 4+/5   R Shoulder Internal Rotation  4+/5   R Shoulder External Rotation 4/5   R Shoulder Horizontal ABduction 4/5   R Shoulder Horizontal ADduction 4/5   Cervical Results   Spurling's  Negative   Compression  Negative   Palpation   Tenderness to Palpation R biceps    Increased Tissue Density B UT R deltoid   Patient Education   What was taught? HEP PT POC   Method  Verbal;Practice;Written;Demo   Patient comprehension Yes         PT  Physical Therapy Plan of Care    ZO:[id number] Curtin, APRN  Referring Provider: Dr Ervin Knack L. Margaretmary Dys - [place]  Diagnosis: Chronic right shoulder pain  (primary encounter diagnosis)   Chronic right shoulder pain [M25.511, G89.29]      Assessment/Objective Findings:   Patient is a 53 year old female with complains of pain in her right Shoulder     Pt with treatment plan and goals as per initial evaluation  She is well motivated and demonstrates good understanding of initial HEP    Clinical presentation today is consistent with chronic R shoulder pain with altered mechanics and impingement  based on evaluation. Contributing factors include postural changes and work duties. Pt will benefit from skilled PT focused on manual therapy and exercise program to address the following problems and impairments noted upon evaluation: Pain, Decreased ROM, Decreased Strength, Decreased Functional Mobility, Decreased Joint Mobility and Decreased Tolerance of ADLs.     These problems limit the patient with the following functional activities: reaching lifting and carrying with R UE. The prescribed treatment plan of care is medically necessary.      Co-morbidities of chronic LBP were identified and taken into considerations of plan of care.      Short Term Functional Goals: 4 weeks.   Pt will report decrease in average pain level without medication to < 5  Pt will be independent with initial HEP  Pt will demonstrate good postural awareness for lifting technique with scapula control    Long Term Goal: 8 weeks.   Pt will be independent with HEP for strengthening and stabilization exercises  Pt will report 0-3 pain with all ADL's and work duties  Pt will have improved postural awareness  Pt will have at least 4+/t R shoulder and scapula strength      Treatment Plan:  ** PT Eval - Low Complexity (CPT 97161)  ** Stretching/ROM/Therapeutic Exercise (97110)  ** Home Exercise  Program/ Patient Education (CPT [phone])  ** Neuromuscular Re-education (CPT [phone])  ** Manual Therapy / Joint / Soft tissue Mobilization (CPT 97140)  ** Functional Activities (CPT 97530)  ** taping prn    Recommend Physical Therapy be continued 1 times per week for 6-8 weeks. Telehealth to in clinic visits prn  The rehabilitation potential for this patient is good with an low complex  stable clinical presentation due to above listed barriers, functional limitations, and clinical exam.    Patient Regis Bill is aware of attendance policy: Yes  Plan of care discussed with Patient/Family: Yes  Patient goals reviewed and incorporated in plan of care: Yes  Patient/Family agrees with plan of care: Yes  Patient/Family education: Yes  Does patient feel safe at home: Yes          Gypsy Decant, PT, Lic # [id number]

## 2019-01-07 NOTE — Progress Notes (Signed)
I certify that the documented Treatment Plan is reasonable and necessary.    01/07/2019  Karianne Nogueira L Adolf-[place], MD

## 2019-01-10 ENCOUNTER — Other Ambulatory Visit: Payer: Self-pay

## 2019-01-13 ENCOUNTER — Ambulatory Visit (HOSPITAL_BASED_OUTPATIENT_CLINIC_OR_DEPARTMENT_OTHER): Payer: No Typology Code available for payment source | Admitting: Rehabilitative and Restorative Service Providers"

## 2019-01-13 DIAGNOSIS — S4991XA Unspecified injury of right shoulder and upper arm, initial encounter: Secondary | ICD-10-CM | POA: Diagnosis present

## 2019-01-13 DIAGNOSIS — M25511 Pain in right shoulder: Secondary | ICD-10-CM | POA: Diagnosis present

## 2019-01-13 DIAGNOSIS — G8929 Other chronic pain: Secondary | ICD-10-CM | POA: Diagnosis present

## 2019-01-13 NOTE — Progress Notes (Signed)
Chronic right shoulder pain [M25.511, G89.29]    S: Pt reports feeling better overall - feels heaviness in R scapula / UT region    O: Refer to Rehabilitation Treatment Flowsheet     01/13/19 1200   Language Information   Language of Care English   Interpreter No   Rehab Discipline   Rehab Discipline PT   Visit   Visit number 4   POC Due date by 02/05/19   Time Calculation   Start Time 1100   Stop Time 1130   Time Calculation (min) 30 min   Pain   Pain Score 3    Manual Therapy   Technique scapula mobs   Manual Therapy 2   Technique STM / ISATM for R C- scapula mm   Ther Exercise   Exercise lat pull downs with BTB   Ther Exercise 2   Exercise standing Y lifts   Ther Exercise 3   Exercise 3 lat stretch - S/L , sit and stand   Patient Education   What was taught? cont HEP added stretch   Method Verbal;Demo;Practice;Written   Patient comprehension Yes         A: Pt tolerated treatment well with improved ROM cont weakness of mid and lower traps with myofascial restrictions noted R UT, lateral scapula border - TTP lats and subscapularis   Added home stretching     P: Continue as per Plan of [place], PT, Lic # [id number]

## 2019-01-21 ENCOUNTER — Other Ambulatory Visit: Payer: Self-pay

## 2019-01-22 ENCOUNTER — Ambulatory Visit (HOSPITAL_BASED_OUTPATIENT_CLINIC_OR_DEPARTMENT_OTHER): Payer: No Typology Code available for payment source | Admitting: Rehabilitative and Restorative Service Providers"

## 2019-01-22 ENCOUNTER — Other Ambulatory Visit (HOSPITAL_BASED_OUTPATIENT_CLINIC_OR_DEPARTMENT_OTHER): Payer: Self-pay | Admitting: Family Medicine

## 2019-01-22 DIAGNOSIS — M25511 Pain in right shoulder: Secondary | ICD-10-CM | POA: Diagnosis present

## 2019-01-22 DIAGNOSIS — G8929 Other chronic pain: Secondary | ICD-10-CM | POA: Diagnosis present

## 2019-01-22 DIAGNOSIS — S4991XA Unspecified injury of right shoulder and upper arm, initial encounter: Secondary | ICD-10-CM | POA: Diagnosis not present

## 2019-01-22 NOTE — Progress Notes (Signed)
Chronic right shoulder pain  (primary encounter diagnosis)     S: Pt reports 0/10 shldr pain but radiation to R CS and UT 6/10 pain before therapy and 3/10 post therapy this visit. Patient reported being compliant with HEP.  O: Refer to Rehabilitation Treatment Flowsheet below.      01/22/19 1600   Language Information   Language of Care English   Interpreter No   Rehab Discipline   Rehab Discipline PT   Visit   Visit number 5   POC Due date by 02/05/19   Time Calculation   Start Time 1600   Stop Time 1630   Time Calculation (min) 30 min   Pain   Pain Score 0   (CS R UT pain 6/10)   Manual Therapy 2   Technique STM / ISATM for R C- scapula mm   Time in minutes 8 min   Ther Exercise   Exercise lat pull downs with BTB   Ther Exercise 2   Exercise prone retraction ext    Reps 2 10   Sets 2 1   Ther Exercise 3   Exercise 3 3x30" lat stretch on wall  , sit and stand   Reps 3 10   Sets 3 1   Holds 3 30"    Ther Exercise 4   Exercise 4 UT stretch seated    Reps 4 2   Holds 4 30"    Ther Exercise 5   Exercise 5 self massage parascapular andUt on wall   Other   Miscellaneous KT - UT one I strip    Patient Education   What was taught? cont HEP    Method Verbal;Demo;Practice;Written   Patient comprehension Yes     A: initiated with manuals - noted increase UT density with improvement on tissue extensibility after manuals - KT application with pt edu on removal in case of any side effect or allergic reaction - pt participated and completed therex with Tc to increase scapula control   P: cont with shldr strengthening program and reaccess KT effectiveness       ** Please note that in the flow sheet below, only exercises and manual therapies that have a time frame and repetition were performed during today's visit. Those without a time frame or repetition were not performed today. They are listed there for the plan of future visits. Thank you.    Alberteen Sam, PTA, Lic # [id number]

## 2019-01-22 NOTE — Progress Notes (Signed)
[facility], Deborah [name] is a 53 year old female has requested a refill of DICLOFENAC GEL.      Last Office Visit: 04/22/18 with PCP  Last Physical Exam: 03/13/2017    FECAL OCCULT BLOOD AGE 67+ due on 03/21/2018    Other Med Adult:  Most Recent BP Reading(s)  11/18/18 : 107/70        Cholesterol (mg/dL)   Date Value   10/08/2013 192     LOW DENSITY LIPOPROTEIN DIRECT (mg/dL)   Date Value   10/08/2013 129     HIGH DENSITY LIPOPROTEIN (mg/dL)   Date Value   10/08/2013 48     TRIGLYCERIDES (mg/dL)   Date Value   10/08/2013 223 (H)         THYROID SCREEN TSH REFLEX FT4 (uIU/mL)   Date Value   02/07/2016 1.380         TSH (THYROID STIM HORMONE) (uIU/mL)   Date Value   12/22/2010 1.31       HEMOGLOBIN A1C (%)   Date Value   06/30/2008 5.2       No results found for: POCA1C      INR (no units)   Date Value   06/15/2008 1.0 (L)       SODIUM (mmol/L)   Date Value   12/04/2017 143       POTASSIUM (mmol/L)   Date Value   12/04/2017 3.9           CREATININE (mg/dL)   Date Value   12/04/2017 1.0       Documented patient preferred pharmacies:    [place], [place] - [place].  Phone: [phone] Fax: [phone]

## 2019-02-03 ENCOUNTER — Other Ambulatory Visit: Payer: Self-pay

## 2019-02-03 ENCOUNTER — Ambulatory Visit
Payer: No Typology Code available for payment source | Attending: Rehabilitative and Restorative Service Providers" | Admitting: Rehabilitative and Restorative Service Providers"

## 2019-02-03 DIAGNOSIS — S4991XA Unspecified injury of right shoulder and upper arm, initial encounter: Secondary | ICD-10-CM | POA: Diagnosis not present

## 2019-02-03 DIAGNOSIS — G8929 Other chronic pain: Secondary | ICD-10-CM | POA: Diagnosis present

## 2019-02-03 DIAGNOSIS — M25511 Pain in right shoulder: Secondary | ICD-10-CM | POA: Diagnosis present

## 2019-02-03 NOTE — Progress Notes (Signed)
02/03/19 1500   Language Information   Language of Care English   Interpreter No   Precautions   Precautions No   Rehab Discipline   Rehab Discipline PT   Visit   Visit number 6   POC Due date DC'd   Time Calculation   Start Time 1530   Stop Time 1543   Time Calculation (min) 13 min   Pain   Pain Score 0    Ther Exercise   Exercise reviewed HEP    Ther Exercise 2   Exercise postural awreness for ligfting    Ther Exercise 3   Exercise 3 stretching exercises through course of day   Patient Education   What was taught? cont with HEP   Method Verbal;Demo   Patient comprehension Yes     Gypsy Decant, PT, Lic # [id number]

## 2019-02-03 NOTE — Progress Notes (Signed)
PHYSICAL THERAPY  DISCHARGE REPORT    DIAGNOSIS: Chronic right shoulder pain  MD: Dr Darlyn Read. Cornel[place]    Your patient, Deborah [name], has been discharged from Physical Therapy after a total of 6 visits.    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    Therapist:     Gypsy Decant, PT, Lic # [id number]

## 2019-02-07 ENCOUNTER — Encounter (HOSPITAL_BASED_OUTPATIENT_CLINIC_OR_DEPARTMENT_OTHER): Payer: Self-pay

## 2019-02-10 ENCOUNTER — Ambulatory Visit
Admission: RE | Admit: 2019-02-10 | Discharge: 2019-02-10 | Disposition: A | Discharge status: Home / Self Care | Payer: No Typology Code available for payment source | Source: Ambulatory Visit | Attending: Internal Medicine | Admitting: Internal Medicine

## 2019-02-10 ENCOUNTER — Other Ambulatory Visit: Payer: Self-pay

## 2019-02-10 ENCOUNTER — Encounter (HOSPITAL_BASED_OUTPATIENT_CLINIC_OR_DEPARTMENT_OTHER): Payer: Self-pay | Admitting: Urology

## 2019-02-10 ENCOUNTER — Ambulatory Visit (HOSPITAL_BASED_OUTPATIENT_CLINIC_OR_DEPARTMENT_OTHER): Payer: No Typology Code available for payment source | Admitting: Internal Medicine

## 2019-02-10 ENCOUNTER — Ambulatory Visit
Payer: No Typology Code available for payment source | Attending: Internal Medicine | Admitting: Licensed Practical Nurse

## 2019-02-10 ENCOUNTER — Ambulatory Visit (HOSPITAL_BASED_OUTPATIENT_CLINIC_OR_DEPARTMENT_OTHER)
Admit: 2019-02-10 | Discharge: 2019-02-10 | Disposition: A | Discharge status: Home / Self Care | Payer: No Typology Code available for payment source

## 2019-02-10 VITALS — BP 137/86 | HR 71 | Temp 97.0°F | Resp 16 | Ht 61.0 in | Wt 189.4 lb

## 2019-02-10 DIAGNOSIS — Z23 Encounter for immunization: Secondary | ICD-10-CM | POA: Diagnosis not present

## 2019-02-10 DIAGNOSIS — M65331 Trigger finger, right middle finger: Secondary | ICD-10-CM

## 2019-02-10 DIAGNOSIS — M1812 Unilateral primary osteoarthritis of first carpometacarpal joint, left hand: Secondary | ICD-10-CM

## 2019-02-10 DIAGNOSIS — M19041 Primary osteoarthritis, right hand: Secondary | ICD-10-CM | POA: Diagnosis present

## 2019-02-10 DIAGNOSIS — M19042 Primary osteoarthritis, left hand: Secondary | ICD-10-CM

## 2019-02-10 NOTE — Progress Notes (Signed)
Review of Patient's Allergies indicates:   Naproxen                Swelling    Comment:Ed visit with angioedema   Ibuprofen               Other (See Comments)    Comment:Epigastric pain, throat swelling?             05/06/12 pt states uses sometimes without issue   Pollen extract-tree*    Runny Nose    Comment:HA, itchy watery eyes  Immunization information reviewed. Current VIS reviewed and given to patient/ guardian. Patient screened for contraindications to influenza vaccine including history of allergy to influenza vaccine, eggs, Gentamycin, Neosporin, Polymixin, gelatin or contact lens solution; adverse reaction to influenza vaccine, history of Guillane-Barre syndrome, or current moderate to severe illness. Screening questions reviewed, no contraindications noted. Verbal assent obtained from patient/ guardian.  See immunization/Injection module or chart review for date of publication and additional information.

## 2019-02-10 NOTE — Progress Notes (Addendum)
[facility]  Rheumatology Follow-Up Patient Note    Date of Visit: 02/10/2019    Patient: Deborah [name]    PCP: Helane Gunther, APRN    Primary Language: [place] [name]  Interpreter: Rodena Piety.    Reason for Visit:  Osteoarthritis of hands, trigger finger.  Last seen August 2020.    History of present illness:  Deborah [name] who is a 53 year old [place] and [place]- speaking female with bilateral knee osteoarthritis, nonalcoholic steatohepatitis, history of H. pylori gastritis in 2008 treatment, history of severe esophagitis seen on most recent EGD 2014, former smoker.    She was seen during initial consultation June 06, 2017 for evaluation of inflammatory arthritis in the setting of polyarthralgia and low titer rheumatoid factor of 1-2.  At the end of that encounter, polyarthralgia was deemed to be due to mechanical causes including bilateral knee osteoarthritis, right shoulder pain due to osteoarthritis versus rotator cuff tendinopathy; right lateral epicondylitis, CMC osteoarthritis and right hand third digit stenosing tenosynovitis.  During this initial visit, she received corticosteroid injection to the right subacromial bursa as well as the right third palmar aspect of the MCP of the hand for stenosing tenosynovitis.  Given her history of esophagitis and severe allergy to NSAIDs we recommended topical Voltaren gel.    She presents today in follow-up.    Notes that she does not have any recurrence of right shoulder pain and physical therapy to be helpful.    Her main concern today is that within the last month she has had recurrent of right third middle finger triggering she is already had a series of 2 injections to this area.    But most bothersome is her left CMC.  Notes that she is bracing with both the custom orthotic and also the one that was given to her in this clinic but still finds it quite painful to abduct and flex the thumb.   Finds it difficult to hold a gallon of milk.  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  Is taking Tylenol and over-the-counter ibuprofen despite having severe gastritis history.  Uses Voltaren gel which intermittently helps.    Denies any signs of fever or febrile illness.  Has not had influenza vaccine inquired about getting it today in clinic.    Precontemplative to hydroxychloroquine.      Rheumatologic Problem List:  1.  Bilateral knee osteoarthritis.  2.  Right shoulder pain likely due to osteoarthritis versus rotator cuff tendinitis versus bursitis.  3.  Right lateral epicondylitis.  4.  Hand osteoarthritis, CMC osteoarthritis.  5.  Right third digit of the hand stenosing tenosynovitis.  6.  Low titer rheumatoid factor 1:2.  7.  NSAID allergy.    Past medical history:  Patient Active Problem List:     Lump or mass in breast     Gastroesophageal reflux disease without esophagitis     Other specified gastritis     Abdominal pain, epigastric     Angioneurotic edema not elsewhere classified     Plantar Fasciitis, L     Leg pain     Tobacco abuse, in remission     Triggering of Finger, R 3rd     Right subacromial bursitis/rotator cuff tendinopathy     Chondromalacia patellae     Lower back pain     De Quervain's Tenosynovitis, R     CTS (Carpal Tunnel Syndrome), b/l  Family history of diabetes mellitus (DM)     Hypolipoproteinemia     Intermittent spinal claudication (HCC)     Vitamin D deficiency     Greater Trochanteric Bursitis, b/l     DDD (degenerative disc disease), lumbar     Obesity     Rapid Noctural Palpitations x 10 Seconds     Sweating     Microscopic hematuria     Reflux esophagitis     Lump of skin of lower extremity     Bilateral chronic knee pain     Bilateral arm pain     Positive occult stool blood test     Chronic right-sided low back pain with right-sided sciatica     SVT To 240 BPM March 2010. As Of 2017, Episodes q 2 Weeks, Usually Subside One Minute, Occ 15  Minutes.  Commonly Relieved With Vagal Maneuvers.     BMI 33.0-33.9,adult     Rapid Palpitations Only Once/Month, Lasting Seconds, Abolished by Valsalva, Aug 2018.      Polyarthralgia     Macular chorioretinal scar of left eye     Hyperopia of both eyes with astigmatism and presbyopia     Osteoarthritis of both knees     Lateral epicondylitis of right elbow     Trigger middle finger of right hand     Osteoarthritis of carpometacarpal (CMC) joint of left thumb     Right shoulder injury     Chronic right shoulder pain    Past surgical history:  Past Surgical History:  No date: CESAREAN DELIVERY ONLY      Comment:  breech  No date: [place] BREAST SPECIMEN      Comment:  s/p fibroadenoma on the L,   No date: SEPTOPLASTY/SUBMUCOUS RESECJ W/WO CARTILAGE GRF    Allergies:  Review of Patient's Allergies indicates:   Naproxen                Swelling    Comment:Ed visit with angioedema   Ibuprofen               Other (See Comments)    Comment:Epigastric pain, throat swelling?             05/06/12 pt states uses sometimes without issue   Pollen extract-tree*    Runny Nose    Comment:HA, itchy watery eyes    Medications:  Current Outpatient Medications   Medication Sig    diclofenac (VOLTAREN) 1 % GEL Gel APPLY TO BOTH HANDS, BOTH ELBOWS AND BOTH KNEES UP TO 4 TIMES A DAY. DO NOT EXCEED 32 GRAMS IN 24 HOURS    metoprolol (TOPROL-XL) 100 MG 24 hr tablet Take 1 tablet by mouth daily    Calcium Carb-Cholecalciferol (CALCIUM + D3) 600-200 MG-UNIT TABS TAKE 1 TABLET BY MOUTH TWO TIMES DAILY    omeprazole (PRILOSEC) 20 MG capsule Take 1 capsule by mouth 2 (two) times daily before meals    acetaminophen (TYLENOL) 500 MG tablet Take 500 mg by mouth every 6 (six) hours as needed for Pain    cholecalciferol (VITAMIN D3) 2000 UNIT tablet Take 1 tablet by mouth daily     No current facility-administered medications for this visit.        Health Maintenance:  Last eye exam- 04/2017  Last dental exam- 5 months ago.   Last colonoscopy-  NEVER  Last bone density- NEVER  PPD or QuantiFERON TB Gold- negative, but unsure of date    FECAL OCCULT BLOOD AGE 75+  due on 03/21/2018  LIPID SCREENING due on 10/09/2018  INFLUENZA VACCINE(1) due on 12/10/2018  MAMMOGRAPHY due on 02/14/2019  AWQ Questionnaire due on 04/23/2019  PEG SCORE (CHRONIC PAIN) due on 04/23/2019  PAP SMEAR due on 06/30/2019  HPV SCREENING due on 06/30/2019  HEALTH CARE PROXY due on 04/23/2023  TETANUS VACCINE(2 - Td) due on 03/08/2026  HIV SCREENING Completed  HEP C SCREEN Completed  PHYSICAL EXAM Completed  PNEUMOCOCCAL VACCINE SERIES (< 65) Completed; No procedure found.    ROS:  Review of Systems: Constitutional, Eyes, ENT/Mouth, Cardiovascular, Respiratory, GI, GU, Neuro, Psych, Heme/Lymph, Skin, Musculoskeletal, and Endocrine systems were reviewed and are NEGATIVE, except for what is documented in the note.    PE:   02/10/19  1454   BP: 137/86   Site: Left Arm   Pulse: 71   Resp: 16   Temp: 97 F (36.1 C)   TempSrc: Temporal   SpO2: 99%   Weight: 85.9 kg (189 lb 6.4 oz)   Height: '5\' 1"'  (1.549 m)     Body mass index is 35.79 kg/m.  GENERAL: NAD. Normocephalic/Atraumatic. Alert and oriented to person, place, and time. Good eye contact. Full affect. Follows commands appropriately.  Well-groomed.    HEENT: Pupils were equal, round and reactive. Extraocular motions are intact.  Examination of the oral mucosa was deferred due to coronavirus pandemic.  Deborah [name] Kitchen There is no scalp rash. No malar rash. Anicteric sclerae. Non-injected conjunctivae. No nystagmus.  Tympanic membranes are clear.  No parotid enlargement.   NECK: Supple. No appreciable cervical lymphadenopathy.  LUNGS: Clear to auscultation without wheezes, rhonchi, rales, or crackles.  CV: S1/S2 normal, without murmurs, gallops, or rubs.  ABDOMEN:  Bowel sounds in all four quadrants, soft, nontender, nondistended. No appreciable organomegaly.  EXTREMITIES: Warm and well-perfused without any sign of cyanosis, clubbing, or edema.  SKIN:  No rashes, nodules, bruising, hyperpigmentation, hypopigmentation, telangiectasias, periungal erythema, splinter hemorrhages, sclerodermatous changes, icterus, petechiae, palpable purpura, tethering, striae, keloid, nail abnormalities or tophi.    MUSCULOSKELETAL:   Hypertrophy of PIPs noted.   Grip strength is normal bilaterally.  Squaring of the left [facility] noted with associated tenderness on palpation, there is a little swelling present in the left [facility] today.  No swelling in the wrists.    Unable to provoke any triggering in the fingers today.  No swelling in elbows. No swelling in the shoulders.  No restriction in range of motion in fingers, wrists, elbows, or shoulders.   No tender joints in the fingers, wrists, or shoulders.    There is tenderness of the right lateral epicondyle.  No pain over the sternoclavicular joint, AC joint or TMJs.   No restriction in ROM of the cervical spine.  No focal back tenderness.  Negative straight leg raise.  Negative Faber's.  Negative Schober's.  Negative logroll.  No restriction in range of motion in hips, knees, or ankles.   No swelling in the knees or ankles.  Bilateral valgus deformity of the knees with hypertrophic changes and crepitations.  No Achille's swelling or tenderness.  There is no tenderness on palpation of the MTPs.    No dactylitis or splaying of the toes.  NEURO: Cranial nerves II-XII intact. Patellar reflexes, Achilles reflexes and brachioradialis reflexes are 2/2 and symmetric. Narrow-based gait.  Power is 5/5 in all four limbs and without weakness. No difficulty getting up from seated position without using hands or out of a supine position.  No assistive ambulatory devices.    LABORATORY  DATA:  Blood Counts  Lab Results   Component Value Date    WBC 10.4 04/06/2017    HGB 13.6 04/06/2017    HCT 40.8 04/06/2017    MCV 87.6 04/06/2017    PLTA 208 04/06/2017      Chemistries  Lab Results   Component Value Date    CA 8.9 12/04/2017    NA 143 12/04/2017    K  3.9 12/04/2017    CO2 28 12/04/2017    CL 107 12/04/2017    BUN 13 12/04/2017    CREAT 1.0 12/04/2017     CrCl cannot be calculated (Patient's most recent lab result is older than the maximum 7 days allowed.).    Liver Function Tests  Lab Results   Component Value Date    ALT 43 04/06/2017    AST 28 04/06/2017    ALBUMIN 4.2 04/06/2017    ALKPHOS 117 04/06/2017     Acute Phase Reactants  Lab Results   Component Value Date    ESR 12 01/31/2017    CRP 0.8 01/31/2017     Rheumatologic Labs    Lab Results   Component Value Date    RF POSITIVE (A) 01/31/2017   Rheumatid Factor (01/2017): 1:2    Lab Results   Component Value Date    CCPAB < 0.5 01/31/2017     Other Labs  Lab Results   Component Value Date    HGBA1C 5.2 06/30/2008     Lab Results   Component Value Date    TSHSC 1.380 02/07/2016     VITAMIN D,25 HYDROXY (ng/mL)   Date Value   01/31/2017 23 (L)     Lab Results   Component Value Date    CKTOTAL 95 06/16/2008     Lab Results   Component Value Date    LDL 129 10/08/2013     HEPATITIS B SURFACE ANTIGEN (no units)   Date Value   04/04/2006 NON-REACTIVE     HEPATITIS B SURFACE ANTIBODY (no units)   Date Value   04/04/2006 NEGATIVE     HEPATITIS C ANTIBODY (no units)   Date Value   04/04/2006 NEGATIVE     Imaging reviewed personally by me today:    Bilateral knee x-rays (12/11/2017): Consistent with osteoarthritis.      Left Hand Xray (February 2019):  Left hand x-ray consistent with osteoarthritis.     Chest Xray (11/2016):  No pneumothorax or pneumonia.     Right Ankle Xray (01/2015):  Lateral ankle soft tissue swelling. No fracture.     Bilateral Knee Xray (03/2013):  1. Tiny marginal osteophytes along the right medial tibiofemoral   compartment. 2. Normal alignment and preservation of the cartilage spaces.     Right Hand Xray (02/2008):  Unremarkable right hand radiographs.    Lumbar Xray (10/2007):  Normal alignment of the vertebral bodies. Vertebral body heights are well  maintained. An peritumoral  disc spaces are normal. No acute fracture,  subluxation or dislocation. No evidence of facet disease. Evidence of   coarse interarticularis defect. SI joints are normal.    Assessment: Deborah [name] is a 53 year old [place] speaking female with severe esophagitis, allergy to NSAID, CMC osteoarthritis, hand also arthritis, bilateral knee osteoarthritis, chronic lower back pain likely degenerative disc disease.    Plan/Recommendations:  1.  Right third digit of the hand stenosing tenosynovitis.   Patient is status post at least 2 corticosteroid injections.  Referring her back to the  hand surgeon Dr. Nettie Elm to discuss surgical options.    2.  Left CMC osteoarthritis.  No improvement prior injections.  Advised her to continue to use Voltaren gel up to 4 times a day, Tylenol up to 3 g a day, and splinting.  Bilateral hand x-rays ordered today.  Would like her to also speak with a hand surgeon Dr. Fredderick Severance to discuss surgical options.  Low suspicion for inflammatory arthritis however I will update the patient's labs to include CBC, creatinine, AST, ALT, albumin, vitamin D 25 OH, TSH, A1c, TSH, lipid panel, CRP, rheumatoid factor, CCP.  Continue recommended hand exercises through occupational therapy.  Explained to the patient that there is an off label use for hydroxychloroquine however given the fact that she does not currently carry diagnosis of inflammatory Fridays it would likely hard to get this approved.    3.  General health maintenance.  TSH, lipid panel, hemoglobin A1c as well as CBC, creatinine AST ALT ordered today.  Advised the patient to follow with her primary care physician however have ordered these labs as a courtesy.  Patient referred to the influenza clinic hopefully should be able to have it done today here at [facility].    4.  History of NSAID allergy with what sounds like angioedema.  I did advise the patient that she should avoid oral NSAIDs given previous history of  allergic reaction.    HEALTH MAINTENANCE: N/A  EDUCATION:  The total face to face time with patient was 25 minutes.  Greater than 50% of that time was spent on counseling/educating patient regarding potential etiologies, pathophysiology, treatment options, need for further work-up including blood work, imaging and referrals, reviewing previous lab results and radiology findings, reviewing internal and external medical records, and coordinating medical care, outside of the time needed to perform any procedures.      REMINDERS FOR NEXT VISIT: N/A    I discussed the likely diagnosis(es),  prognosis(es), and the various treatment options in detail with the patient, including observation.     Risks and benefits of the treatment plan(s) were discussed.     The patient's questions have been addressed and answered.     We discussed importance of medication compliance.     The patient was ready to learn and no apparent learning barriers were identified.     Follow-up in 4 months face to face, earlier if needed.    Deborah [name] verbally expressed an understanding and agreement with the plan as outlined above.    This dictation was made using voice recognition software.  There is potential for transcription errors and incorrect word usage based on limitations of the software.      Isley Weisheit Adolf-Ubokudom, MD, [facility] Payer.     Labs and imaging reviewed.  Hand xrays show osteoarthritis.  Low vitamin D.  Will need to start ergocalciferol 50000 IU weekly for 12 weekly. Prescription sent to pharmacy on file.  Nurse to call patient.  Letter sent.       CBC:  WBC 8.6  Hemoglobin 12.5  Platelets 227  MCV 88  Creatinine:  0.8, EGFR >60   AST:  34  ALT: 47  Albumin:  4.2   vitamin D 25 OH:  17  TSH:  1.793  A1c: 5.0   LDL 121  CRP:  0.8 mg/ddL  rheumatoid factor:  1:1; negative   CCP:  negative    Right Hand Xray (02/10/2019): Bones and Joints: There is mild joint space narrowing and  particular osteophytes of the distal interphalangeal  joints. There are no erosions.   Soft Tissues: Unremarkable.    Impression:   Osteoarthritis.    Left Hand Xray (02/10/2019):  Bones and Joints: There is mild joint space narrowing and particular osteophytes of the distal interphalangeal joints and thumb basal joint. There are no erosions.   Soft Tissues: Unremarkable.    Impression:   Mild osteoarthritis is unchanged.

## 2019-02-12 ENCOUNTER — Ambulatory Visit
Admission: RE | Admit: 2019-02-12 | Discharge: 2019-02-12 | Disposition: A | Discharge status: Home / Self Care | Payer: No Typology Code available for payment source | Attending: Family Medicine | Admitting: Family Medicine

## 2019-02-12 ENCOUNTER — Ambulatory Visit (HOSPITAL_BASED_OUTPATIENT_CLINIC_OR_DEPARTMENT_OTHER): Payer: Self-pay

## 2019-02-12 ENCOUNTER — Other Ambulatory Visit (HOSPITAL_BASED_OUTPATIENT_CLINIC_OR_DEPARTMENT_OTHER): Payer: Self-pay

## 2019-02-12 DIAGNOSIS — Z20828 Contact with and (suspected) exposure to other viral communicable diseases: Secondary | ICD-10-CM | POA: Diagnosis present

## 2019-02-12 DIAGNOSIS — Z20822 Contact with and (suspected) exposure to covid-19: Secondary | ICD-10-CM

## 2019-02-12 DIAGNOSIS — U071 COVID-19: Secondary | ICD-10-CM

## 2019-02-12 HISTORY — DX: COVID-19: U07.1

## 2019-02-12 NOTE — Telephone Encounter (Signed)
[place]  Self  [phone]  Headaches since yesterday, getting worse. Patient did have recent contact with positive on sonday

## 2019-02-12 NOTE — Telephone Encounter (Addendum)
TC to pt via [place]uguese speaking telephone interpreter [phone].  Pt states had contact w/covid positive person yesterday and the week before.  Severe headache since yesterday.  States down to a 6/10 pain after taking OTC tylenol.  Denies CP, SOB, fevers, vision changes, dizziness/weakness, light sensitivity, numbness/tingling, stiff neck, redness/swelling, n/v/d, trauma/injury, or any other associated symptoms.  Pt is speaking in clear, full sentences during triage.  Please see disposition below.    [place]:    I have confirmed the patient's name and DOB. Yes     Emergency care:    1. Is the patient patient gasping for air or unable to speak? No  2. Does the patient have new severe chest pain? No  3. Is the patient lethargic or does the patient have altered mental status?  No    A) COVID symptoms (criteria for testing):    Does the patient have any of the following:    1. Fever? No  2. New or worsening cough? No  3. New or worsening SOB? No  4. New myalgias (muscle aches)? No  5. Anosmia (lack of smell/taste)? No  6. New sore throat? No  7. New runny nose? No  8. New diarrhea? No    First day of symptoms:   02/11/2019  Current day of symptoms:  Day 2    First day of dyspnea:   n/a  If dyspnea, duration of dyspnea: N/A    If fever, duration of fever: N/A    Other symptoms: see above    C) Need for in-person evaluation:    1. Is the patient experiencing respiratory symptoms (dypsnea, orthopnea, dyspnea on exertion, wheezing), worsening respiratory symptoms from baseline, or worsening of a chronic cough? No  2. Has the patient had a fever for > 48 hours? No  3. Is the patient at [place] ? 4 with worsening symptoms?   4. Does the patient have chest pain (note: new severe chest pain must be referred to ED)? No  5. Is the patient dizzy or lightheaded? No  6. Does the patient have severe sore throat? No  7. Is the patient pregnant? No    D) Need for testing:    1. Does the patient have any  symptom of COVID (or is the patient an asymptomatic contact)?  Yes  2. If no to (1), does the patient desire public health testing? N/A    COVID HOME CARE ADVICE:    The following instructions are intended for patients who have been tested for COVID or who have not been tested but are symptomatic and therefore treated as presumptive COVID.     Patients can listen to a recording of this advice in [place], [place], [place], or [place] by [employer] [phone], or access at [email or web address].     Treating symptoms and anticipatory guidance:     There are no specific therapies for coronavirus in the outpatient setting.   Continue to take your prescribed medicines.   Do not take other medicines unless they have been prescribed.   Rest and take plenty of fluids.   Use Tylenol rather than ibuprofen or other NSAIDs.   Patients with worsening symptoms should:  ? Call [phone] M-Sa 8-5 or their home clinic phone number.  ? Call 911 in an emergency.     Self-isolation and self-quarantine precautions:     Self-isolation directions for patients:  ? Patients should not leave home -- no work, school, public places, stores, public transportation.  No exceptions except emergencies.  ? Stay in one room of the house by yourself, ideally with a window open. Use a separate bathroom if available. Household members should sleep in a separate room.  ? Duration of self-isolation: AT LEAST 10 days since first day of symptoms AND at least 24 hours since resolution of fever without antipyretics AND improvement of other symptoms.   Self-quarantine directions for household members and close contacts:  ? All household members should stay home for 14 days -- no work, school, public places, stores, public transportation.  ? Patients should notify all close contacts that they have symptoms of COVID. Close contacts include anyone with whom the patient spent significant time (> 10 minutes, less than 6 feet apart) since becoming  symptomatic. This includes co-workers in [employer], etc. Close contacts must also self-quarantine.     Additional instructions:     Wash hands frequently for at least 20 seconds with soap and water or alcohol-based sanitizer (soap and water preferred if hands visibly dirty).   Cough or sneeze into a tissue, NOT your hand.   Avoid touching your face/mouth/nose/eyes.   Wear a facemask when sharing a room with other people.   Do not share personal care items (brushes, etc.), kitchen items (dishes, cups, silverware), or linens (towels/bedding) with other household members; they should all be washed before reuse.   Do not handle pets or other animals.   Clean all high-touch surfaces every day (counters, tabletops, doorknobs, bathroom fixtures like faucets, toilets, phones, bedside tables, lamps and lightswitches).   Prohibit all visitors unless absolutely necessary.    Disposition:    1. I have provided the patient with Home Care Advice (use COVIDHOMECARE). Yes   2. I have ordered testing (for symptomatic patients not requiring in-person evaluation). Yes.  3. I have given the patient directions to Drive-Thru Testing (use DRIVETHRUDIRECTIONS) or [facility] (ACCDIRECTIONS), if appropriate. Yes    Final disposition: Patient requires testing and televisit with PCP/team, appointment arranged   COVID testing scheduled for today.  Needs televisit w/PCP office today or tomorrow - sent to PCP and PCP office scheduling pool to reach out to pt to schedule - pt aware.  Reviewed home care instructionsas per nursing triage protocol.  Pt was advisedif symptoms change, worsen, new symptoms develops,to call CTC asap- # provided.  Pt was advised in the event of a medical emergency, to call 911.  Pt verbalized understanding and agreed with plan    Reason for Disposition   Unexplained headache that is present > 24 hours   New headache and age > 50    Protocols used: ADULT HEADACHE-A-[place]

## 2019-02-13 ENCOUNTER — Telehealth (HOSPITAL_BASED_OUTPATIENT_CLINIC_OR_DEPARTMENT_OTHER): Payer: Self-pay | Admitting: Family Medicine

## 2019-02-13 ENCOUNTER — Ambulatory Visit: Payer: No Typology Code available for payment source | Attending: Family Medicine | Admitting: Family Medicine

## 2019-02-13 DIAGNOSIS — R519 Headache, unspecified: Secondary | ICD-10-CM | POA: Insufficient documentation

## 2019-02-13 LAB — COVID-19 OUTPATIENT: COVID-19 OUTPATIENT: NEGATIVE

## 2019-02-13 NOTE — Telephone Encounter (Signed)
Letter sent through mychart.

## 2019-02-13 NOTE — Progress Notes (Signed)
Review of Patient's Allergies indicates:   Naproxen                Swelling    Comment:Ed visit with angioedema   Ibuprofen               Other (See Comments)    Comment:Epigastric pain, throat swelling?             05/06/12 pt states uses sometimes without issue   Pollen extract-tree*    Runny Nose    Comment:HA, itchy watery eyes    Current Outpatient Medications   Medication Sig    diclofenac (VOLTAREN) 1 % GEL Gel APPLY TO BOTH HANDS, BOTH ELBOWS AND BOTH KNEES UP TO 4 TIMES A DAY. DO NOT EXCEED 32 GRAMS IN 24 HOURS    metoprolol (TOPROL-XL) 100 MG 24 hr tablet Take 1 tablet by mouth daily    Calcium Carb-Cholecalciferol (CALCIUM + D3) 600-200 MG-UNIT TABS TAKE 1 TABLET BY MOUTH TWO TIMES DAILY    omeprazole (PRILOSEC) 20 MG capsule Take 1 capsule by mouth 2 (two) times daily before meals    acetaminophen (TYLENOL) 500 MG tablet Take 500 mg by mouth every 6 (six) hours as needed for Pain    cholecalciferol (VITAMIN D3) 2000 UNIT tablet Take 1 tablet by mouth daily     No current facility-administered medications for this visit.      Patient Active Problem List:     Lump or mass in breast     Gastroesophageal reflux disease without esophagitis     Other specified gastritis     Abdominal pain, epigastric     Angioneurotic edema not elsewhere classified     Plantar Fasciitis, L     Leg pain     Tobacco abuse, in remission     Triggering of Finger, R 3rd     Right subacromial bursitis/rotator cuff tendinopathy     Chondromalacia patellae     Lower back pain     De Quervain's Tenosynovitis, R     CTS (Carpal Tunnel Syndrome), b/l     Family history of diabetes mellitus (DM)     Hypolipoproteinemia     Intermittent spinal claudication (HCC)     Vitamin D deficiency     Greater Trochanteric Bursitis, b/l     DDD (degenerative disc disease), lumbar     Obesity     Rapid Noctural Palpitations x 10 Seconds     Sweating     Microscopic hematuria     Reflux esophagitis     Lump of skin of lower  extremity     Bilateral chronic knee pain     Bilateral arm pain     Positive occult stool blood test     Chronic right-sided low back pain with right-sided sciatica     SVT To 240 BPM March 2010. As Of 2017, Episodes q 2 Weeks, Usually Subside One Minute, Occ 15 Minutes.  Commonly Relieved With Vagal Maneuvers.     BMI 33.0-33.9,adult     Rapid Palpitations Only Once/Month, Lasting Seconds, Abolished by Valsalva, Aug 2018.      Polyarthralgia     Macular chorioretinal scar of left eye     Hyperopia of both eyes with astigmatism and presbyopia     Osteoarthritis of both knees     Lateral epicondylitis of right elbow     Trigger middle finger of right hand     Primary osteoarthritis of first carpometacarpal joint of left hand  Right shoulder injury     Chronic right shoulder pain     Suspected COVID-19 virus infection    Pt was triaged to me by [facility]  02/12/19 2/2 reported h/a  Now h/a resolved  Pt sought a test because had contact w work friend's husband + (pt's friend's test subsequently was neg)  Pt wanted covid test results (-)  No fever  No cough  No sore throat  "Feels fine" since tested yesterday    O: GEN: Sounds fine    (R51.9) Headache disorder  (primary encounter diagnosis)  Comment: self abated  Occurred in setting of worrying about being exposed potentially to covid  Plan: Disc'd test results as well as poss that results could be false neg  Enc'd soc distance, quarantine and monitor sx  Rev'd prec's and instructions from yesterday's RN triage note  Seek med attn PRN worry sx: recurring h/a, URI sx, fever, cough, DOE  Pt stated she understood and agreed to do.

## 2019-02-17 ENCOUNTER — Ambulatory Visit
Admission: RE | Admit: 2019-02-17 | Discharge: 2019-02-17 | Disposition: A | Discharge status: Home / Self Care | Payer: No Typology Code available for payment source | Attending: Internal Medicine | Admitting: Internal Medicine

## 2019-02-17 ENCOUNTER — Other Ambulatory Visit: Payer: Self-pay

## 2019-02-17 DIAGNOSIS — M65331 Trigger finger, right middle finger: Secondary | ICD-10-CM | POA: Diagnosis present

## 2019-02-17 DIAGNOSIS — M1812 Unilateral primary osteoarthritis of first carpometacarpal joint, left hand: Secondary | ICD-10-CM | POA: Insufficient documentation

## 2019-02-17 LAB — CBC, PLATELET & DIFFERENTIAL
ABSOLUTE BASO COUNT: 0.1 10*3/uL (ref 0.0–0.1)
ABSOLUTE EOSINOPHIL COUNT: 0.1 10*3/uL (ref 0.0–0.8)
ABSOLUTE IMM GRAN COUNT: 0.01 10*3/uL (ref 0.00–0.03)
ABSOLUTE LYMPH COUNT: 3.2 10*3/uL (ref 0.6–5.9)
ABSOLUTE MONO COUNT: 0.7 10*3/uL (ref 0.2–1.4)
ABSOLUTE NEUTROPHIL COUNT: 4.5 10*3/uL (ref 1.6–8.3)
ABSOLUTE NRBC COUNT: 0 10*3/uL (ref 0.0–0.0)
BASOPHIL %: 0.6 % (ref 0.0–1.2)
EOSINOPHIL %: 1.2 % (ref 0.0–7.0)
HEMATOCRIT: 38.3 % (ref 34.1–44.9)
HEMOGLOBIN: 12.5 g/dL (ref 11.2–15.7)
IMMATURE GRANULOCYTE %: 0.1 % (ref 0.0–0.4)
LYMPHOCYTE %: 37.3 % (ref 15.0–54.0)
MEAN CORP HGB CONC: 32.6 g/dL (ref 31.0–37.0)
MEAN CORPUSCULAR HGB: 28.9 pg (ref 26.0–34.0)
MEAN CORPUSCULAR VOL: 88.7 fl (ref 80.0–100.0)
MEAN PLATELET VOLUME: 10.2 fL (ref 8.7–12.5)
MONOCYTE %: 7.8 % (ref 4.0–13.0)
NEUTROPHIL %: 53 % (ref 40.0–75.0)
NRBC %: 0 % (ref 0.0–0.0)
PLATELET COUNT: 227 10*3/uL (ref 150–400)
RBC DISTRIBUTION WIDTH STD DEV: 38.6 fL (ref 35.1–46.3)
RED BLOOD CELL COUNT: 4.32 M/uL (ref 3.90–5.20)
WHITE BLOOD CELL COUNT: 8.6 10*3/uL (ref 4.0–11.0)

## 2019-02-18 ENCOUNTER — Encounter (HOSPITAL_BASED_OUTPATIENT_CLINIC_OR_DEPARTMENT_OTHER): Payer: Self-pay | Admitting: Internal Medicine

## 2019-02-18 ENCOUNTER — Telehealth (HOSPITAL_BASED_OUTPATIENT_CLINIC_OR_DEPARTMENT_OTHER): Payer: Self-pay | Admitting: Internal Medicine

## 2019-02-18 LAB — THYROID SCREEN TSH REFLEX FT4: THYROID SCREEN TSH REFLEX FT4: 1.793 u[IU]/mL (ref 0.358–3.740)

## 2019-02-18 LAB — ASPARTATE AMINOTRANSFERASE: ASPARTATE AMINOTRANSFERASE: 34 U/L (ref 8–34)

## 2019-02-18 LAB — LIPID PANEL
Cholesterol: 187 mg/dL (ref 0–239)
HIGH DENSITY LIPOPROTEIN: 44 mg/dL (ref 40–?)
LOW DENSITY LIPOPROTEIN DIRECT: 121 mg/dL (ref 0–189)
TRIGLYCERIDES: 197 mg/dL — ABNORMAL HIGH (ref 0–150)

## 2019-02-18 LAB — CREATININE WITH GFR
CREATININE: 0.8 mg/dL (ref 0.4–1.2)
ESTIMATED GLOMERULAR FILT RATE: >60 mL/min (ref >60)

## 2019-02-18 LAB — C-REACTIVE PROTEIN: C-REACTIVE PROTEIN: 0.8 mg/dL (ref 0.0–1.8)

## 2019-02-18 LAB — HEMOGLOBIN A1C
ESTIMATED AVERAGE GLUCOSE: 97 (ref 74–160)
HEMOGLOBIN A1C: 5 % (ref 4.0–5.6)

## 2019-02-18 LAB — RHEUMATOID FACTOR: RHEUMATOID FACTOR: POSITIVE — AB

## 2019-02-18 LAB — CYCLIC CITRULLIN PEPTIDE IGG: CYCLIC CITRULLIN PEPTIDE IgG: 0.7 U/mL (ref 0.0–2.9)

## 2019-02-18 LAB — RHEUMATOID FACTOR TITER: RHEUMATOID FACTOR TITER: 1:1 {titer}

## 2019-02-18 LAB — ALANINE AMINOTRANSFERASE: ALANINE AMINOTRANSFERASE: 47 U/L — ABNORMAL HIGH (ref 12–45)

## 2019-02-18 LAB — ALBUMIN: ALBUMIN: 4.2 g/dL (ref 3.4–5.0)

## 2019-02-18 LAB — VITAMIN D,25 HYDROXY: VITAMIN D,25 HYDROXY: 17 ng/mL — CL (ref 30.0–100.0)

## 2019-02-18 MED ORDER — ERGOCALCIFEROL 1.25 MG (50000 UT) PO CAPS
50000.0000 [IU] | ORAL_CAPSULE | ORAL | 2 refills | Status: DC
Start: 2019-02-18 — End: 2019-06-09

## 2019-02-18 NOTE — Addendum Note (Signed)
Addended by: Drue Flirt L on: 02/18/2019 08:36 PM     Modules accepted: Orders

## 2019-02-18 NOTE — Telephone Encounter (Signed)
Labs and imaging reviewed.  Hand xrays show osteoarthritis.  Low vitamin D.  Will need to start ergocalciferol 50000 IU weekly for 12 weekly. Prescription sent to pharmacy on file.  Nurse to call patient.  Letter sent.       CBC:  WBC 8.6  Hemoglobin 12.5  Platelets 227  MCV 88  Creatinine:  0.8, EGFR >60   AST:  34  ALT: 47  Albumin:  4.2   vitamin D 25 OH:  17  TSH:  1.793  A1c: 5.0   LDL 121  CRP:  0.8 mg/ddL  rheumatoid factor:  1:1; negative   CCP:  negative    Right Hand Xray (02/10/2019): Bones and Joints: There is mild joint space narrowing and particular osteophytes of the distal interphalangeal joints. There are no erosions.   Soft Tissues: Unremarkable.    Impression:   Osteoarthritis.    Left Hand Xray (02/10/2019):  Bones and Joints: There is mild joint space narrowing and particular osteophytes of the distal interphalangeal joints and thumb basal joint. There are no erosions.   Soft Tissues: Unremarkable.    Impression:   Mild osteoarthritis is unchanged.

## 2019-02-19 NOTE — Telephone Encounter (Signed)
Call to patient with [place] interpreter ID: [phone].  Spoke with patient.  Relayed message per Margaretmary Dys, MD.  She understands and agrees with plan.

## 2019-02-24 ENCOUNTER — Encounter (HOSPITAL_BASED_OUTPATIENT_CLINIC_OR_DEPARTMENT_OTHER): Payer: Self-pay | Admitting: Ophthalmology

## 2019-02-24 ENCOUNTER — Ambulatory Visit: Payer: No Typology Code available for payment source | Attending: Internal Medicine | Admitting: Ophthalmology

## 2019-02-24 ENCOUNTER — Other Ambulatory Visit: Payer: Self-pay

## 2019-02-24 DIAGNOSIS — H52203 Unspecified astigmatism, bilateral: Secondary | ICD-10-CM | POA: Diagnosis not present

## 2019-02-24 DIAGNOSIS — H524 Presbyopia: Secondary | ICD-10-CM | POA: Insufficient documentation

## 2019-02-24 DIAGNOSIS — H31012 Macula scars of posterior pole (postinflammatory) (post-traumatic), left eye: Secondary | ICD-10-CM | POA: Diagnosis present

## 2019-02-24 DIAGNOSIS — H5203 Hypermetropia, bilateral: Secondary | ICD-10-CM | POA: Diagnosis present

## 2019-02-24 NOTE — Progress Notes (Signed)
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  There are no changes, and no active inflammation. No specific therapy. Follow.    Hyperopia with astigmatism and presbyopia.  She's given a prescription for glasses.    Otherwise normal eye exam.    Other: She is well, not sick, no cough, no fever, works as Engineer, building services, wears a mask.  I reminded her of the importance of social distancing, to reduce the risk of serious infection.

## 2019-02-24 NOTE — Progress Notes (Signed)
Here for CE  HX chorioretinal scaring OS / hyperopia with astigmatism and presbyopia ou / HX uveitis OS  Patient denies any changes in vision both eyes.

## 2019-03-11 ENCOUNTER — Ambulatory Visit: Payer: No Typology Code available for payment source | Attending: Internal Medicine | Admitting: Internal Medicine

## 2019-03-11 DIAGNOSIS — Z6833 Body mass index (BMI) 33.0-33.9, adult: Secondary | ICD-10-CM

## 2019-03-11 DIAGNOSIS — R002 Palpitations: Secondary | ICD-10-CM | POA: Diagnosis present

## 2019-03-11 DIAGNOSIS — I471 Supraventricular tachycardia: Secondary | ICD-10-CM | POA: Diagnosis not present

## 2019-03-11 NOTE — Progress Notes (Signed)
OUTPATIENT CARDIOLOGY PROGRESS NOTE  Date of visit: 03/11/2019    Deborah [name] is a 53 year old [place] mother of 24, a 73 year old son, who lives with her and grad'd from [employer], and has started his training in plumbing.  Today he assissted with interpreting.   Her problem list includes GERD, rapid palpitations, SVT to 240 seen in 2010, and wheezing.     In summary patient is 53 year old female with the following medical problems:  BMI 33.0-33.9,adult  Rapid Palpitations Only Once/Month, Lasting Seconds, Abolished by Valsalva, Aug 2018.   SVT To 240 BPM March 2010. As Of 2017, Episodes q 2 Weeks, Usually Subside One Minute, Occ 15 Minutes.  Commonly Relieved With Vagal Maneuvers.       ergocalciferol (VITAMIN D2) 50000 UNIT capsule, Take 1 capsule by mouth once a week  for 12 doses, Disp: 4 capsule, Rfl: 2  diclofenac (VOLTAREN) 1 % GEL Gel, APPLY TO BOTH HANDS, BOTH ELBOWS AND BOTH KNEES UP TO 4 TIMES A DAY. DO NOT EXCEED 32 GRAMS IN 24 HOURS, Disp: 100 g, Rfl: 2  metoprolol (TOPROL-XL) 100 MG 24 hr tablet, Take 1 tablet by mouth daily, Disp: 90 tablet, Rfl: 3  Calcium Carb-Cholecalciferol (CALCIUM + D3) 600-200 MG-UNIT TABS, TAKE 1 TABLET BY MOUTH TWO TIMES DAILY, Disp: 60 tablet, Rfl: 11  omeprazole (PRILOSEC) 20 MG capsule, Take 1 capsule by mouth 2 (two) times daily before meals, Disp: 60 capsule, Rfl: 11  acetaminophen (TYLENOL) 500 MG tablet, Take 500 mg by mouth every 6 (six) hours as needed for Pain, Disp: , Rfl:   cholecalciferol (VITAMIN D3) 2000 UNIT tablet, Take 1 tablet by mouth daily, Disp: 30 tablet, Rfl: 11    No current facility-administered medications on file prior to visit.       Review of Patient's Allergies indicates:   Naproxen                Swelling    Comment:Ed visit with angioedema   Ibuprofen               Other (See Comments)    Comment:Epigastric pain, throat swelling?             05/06/12 pt states uses sometimes without issue   Pollen extract-tree*    Runny  Nose    Comment:HA, itchy watery eyes    SOCIAL & FAMILY HISTORY: Were reviewed and are unchanged from my previous note.    Review of systems: All other systems reviewed and are negative except for the issues cited below.     PERTINENT INVESTIGATIONS:    1. EKG: (on my personal review)  04 Dec 2017:  NSR 75.  NORMAL.    2. LABS:   Lab Results   Component Value Date    NA 143 12/04/2017    K 3.9 12/04/2017    CL 107 12/04/2017    CO2 28 12/04/2017    BUN 13 12/04/2017    CREAT 0.8 02/17/2019    GLUCOSER 99 12/04/2017     Lab Results   Component Value Date    LDL 121 02/17/2019    HDL 44 02/17/2019    TG 197 (H) 02/17/2019     No results found for: PROBNP  No results found for: BNP    3. ECHO:     4. STRESS TEST:     5. HOLTER:    ASSESSMENT:    (R00.2) Rapid Noctural Palpitations x 10 Seconds  Comment:  3 nights in a row in July 2019.  Prior sxs 2 months earlier.  Nothing since July.  Awakened by very rapid palps lasting a few minutes.  Subsided before she considered going to ED.  Valsalva with effect, occ needed to progress to gag with benefit.  No obvious precipitant.      2 weeks ago while in conversation she had rapid palps with body shaking, but no dizzy, sweating, no CP or SOB.  Did gag 3x and finally succeeded in abolishing it.      (I47.1) SVT To 240 BPM March 2010. As Of 2017, Episodes q 2 Weeks, Usually Subside One Minute, Occ 15 Minutes.  Commonly Relieved With Vagal Maneuvers.  Comment:  Seems likely to have been the arrhythmia she experienced last month.      (R00.2) Rapid Palpitations Only Once/Month, Lasting Seconds, Abolished by Valsalva, July 2019.   Comment: Continue use of Vagal maneuvers.  She's on metop 100/day    Had TSH <2 years ago when she already had arrhythmias -- no need to repeat.    Her K+ was 3.3 in ED last Dec -- recheck now.  Exam unremarkable.    Able to walk 2 floors of stairs.   Walks 3 blocks -- doesn't really need to walk more.  Housecleaner -- vigorous work.      Discussed  ablation -- she's wants to hold off even though these events frighten her.  OK -- no change Rx.

## 2019-03-12 ENCOUNTER — Other Ambulatory Visit: Payer: Self-pay

## 2019-03-24 ENCOUNTER — Other Ambulatory Visit (HOSPITAL_BASED_OUTPATIENT_CLINIC_OR_DEPARTMENT_OTHER): Payer: Self-pay | Admitting: Family Medicine

## 2019-03-24 DIAGNOSIS — E559 Vitamin D deficiency, unspecified: Secondary | ICD-10-CM

## 2019-03-24 NOTE — Telephone Encounter (Signed)
[facility], Deborah [name] is a 53 year old female has requested a refill of      -  Vit D       Last Office Visit: 02/13/2019 with Pricilla Handler  Last Physical Exam: 03/13/2017     FECAL OCCULT BLOOD AGE 64+ due on 03/21/2018     Other Med Adult:  Most Recent BP Reading(s)  02/10/19 : 137/86        Cholesterol (mg/dL)   Date Value   02/17/2019 187     LOW DENSITY LIPOPROTEIN DIRECT (mg/dL)   Date Value   02/17/2019 121     HIGH DENSITY LIPOPROTEIN (mg/dL)   Date Value   02/17/2019 44     TRIGLYCERIDES (mg/dL)   Date Value   02/17/2019 197 (H)         THYROID SCREEN TSH REFLEX FT4 (uIU/mL)   Date Value   02/17/2019 1.793         TSH (THYROID STIM HORMONE) (uIU/mL)   Date Value   12/22/2010 1.31       HEMOGLOBIN A1C (%)   Date Value   02/17/2019 5.0       No results found for: POCA1C      INR (no units)   Date Value   06/15/2008 1.0 (L)       SODIUM (mmol/L)   Date Value   12/04/2017 143       POTASSIUM (mmol/L)   Date Value   12/04/2017 3.9           CREATININE (mg/dL)   Date Value   02/17/2019 0.8        Documented patient preferred pharmacies:    [facility] [place] [facility] [place].  Phone: [phone] Fax: [phone]

## 2019-03-25 ENCOUNTER — Ambulatory Visit: Payer: No Typology Code available for payment source | Attending: Family Medicine | Admitting: Hand Surgery

## 2019-03-25 ENCOUNTER — Other Ambulatory Visit: Payer: Self-pay

## 2019-03-25 ENCOUNTER — Encounter (HOSPITAL_BASED_OUTPATIENT_CLINIC_OR_DEPARTMENT_OTHER): Payer: Self-pay | Admitting: Hand Surgery

## 2019-03-25 VITALS — BP 123/80 | HR 90 | Temp 98.7°F | Resp 18

## 2019-03-25 DIAGNOSIS — M1812 Unilateral primary osteoarthritis of first carpometacarpal joint, left hand: Secondary | ICD-10-CM | POA: Diagnosis present

## 2019-03-25 NOTE — Progress Notes (Signed)
HAND SURGERY NEW PATIENT VISIT    Helane Gunther, APRN    CC: Left thumb basal joint pain    HPI- This is a 53 year old year old female who presents today for evaluation of her left thumb basal joint pain.  She has had pain on and off for years but for the past several months has been significantly worse.  She has not had formal treatment.  I do not find record of cortisone injections in her chart.  She was referred to [place] by rheumatology.  Dr. Janeann Forehand also referred her to hand therapy for splinting but she tells me that is not effective.    She tells me her left thumb pain is particularly bad when she tries to open up by or with any type of gripping.  Denies numbness or paresthesias in the left hand.    We had seen her in the past for trigger fingers in 2012.  She also has a right middle finger trigger finger which was successfully injected by Dr. Janeann Forehand.    Past Medical History:  No date: Esophageal reflux  No date: Heart disease  No date: Hyperopia  05/18/2017: Hyperopia of both eyes with astigmatism and presbyopia  No date: Irregular menstrual cycle  05/18/2017: Macular chorioretinal scar of left eye      Comment:  Temporal to fovea, left eye  No date: Pregnant state, incidental      Comment:  c/s breech  3/10: SVT (supraventricular tachycardia) (HCC)      Comment:  TCH  No date: Wears eyeglasses  Past Surgical History:  No date: CESAREAN DELIVERY ONLY      Comment:  breech  No date: [place] BREAST SPECIMEN      Comment:  s/p fibroadenoma on the L,   No date: SEPTOPLASTY/SUBMUCOUS RESECJ W/WO CARTILAGE GRF  Review of Patient's Allergies indicates:   Naproxen                Swelling    Comment:Ed visit with angioedema   Ibuprofen               Other (See Comments)    Comment:Epigastric pain, throat swelling?             05/06/12 pt states uses sometimes without issue   Pollen extract-tree*    Runny Nose    Comment:HA, itchy watery eyes  Cholecalciferol (VITAMIN D3) 50 MCG (2000 UT) TABS, TAKE 1 TABLET BY MOUTH DAILY, Disp:  30 tablet, Rfl: 11  ergocalciferol (VITAMIN D2) 50000 UNIT capsule, Take 1 capsule by mouth once a week  for 12 doses, Disp: 4 capsule, Rfl: 2  diclofenac (VOLTAREN) 1 % GEL Gel, APPLY TO BOTH HANDS, BOTH ELBOWS AND BOTH KNEES UP TO 4 TIMES A DAY. DO NOT EXCEED 32 GRAMS IN 24 HOURS, Disp: 100 g, Rfl: 2  metoprolol (TOPROL-XL) 100 MG 24 hr tablet, Take 1 tablet by mouth daily, Disp: 90 tablet, Rfl: 3  Calcium Carb-Cholecalciferol (CALCIUM + D3) 600-200 MG-UNIT TABS, TAKE 1 TABLET BY MOUTH TWO TIMES DAILY, Disp: 60 tablet, Rfl: 11  omeprazole (PRILOSEC) 20 MG capsule, Take 1 capsule by mouth 2 (two) times daily before meals, Disp: 60 capsule, Rfl: 11  acetaminophen (TYLENOL) 500 MG tablet, Take 500 mg by mouth every 6 (six) hours as needed for Pain, Disp: , Rfl:     No current facility-administered medications on file prior to visit.       PMH, Medications and allergies were all reviewed and updated  in EPIC today.    Social History-works in housecleaning  Social History    Tobacco Use      Smoking status: Former Smoker        Packs/day: 0.50        Years: 27.00        Pack years: 13.5        Types: Cigarettes        Quit date: 06/28/2002        Years since quitting: 16.7      Smokeless tobacco: Never Used    Alcohol use: No      Alcohol/week: 0.0 standard drinks    Review of patient's family history indicates:  Problem: Heart      Relation: Father          Age of Onset: (Not Specified)          Comment: arrhythmia  Problem: Hypertension      Relation: Mother          Age of Onset: (Not Specified)  Problem: Hypertension      Relation: Father          Age of Onset: (Not Specified)  Problem: Diabetes      Relation: Mother          Age of Onset: (Not Specified)  Problem: Diabetes      Relation: Maternal Uncle          Age of Onset: (Not Specified)  Problem: Diabetes      Relation: Maternal Uncle          Age of Onset: (Not Specified)  Problem: Glaucoma      Relation: Maternal Uncle          Age of Onset: (Not  Specified)  Problem: Heart      Relation: Maternal Grandmother          Age of Onset: (Not Specified)  Problem: Heart      Relation: Maternal Uncle          Age of Onset: (Not Specified)  Problem: Heart      Relation: Maternal Uncle          Age of Onset: (Not Specified)  Problem: Cancer - Other      Relation: FamHxNeg          Age of Onset: (Not Specified)  Problem: Cancer - Breast      Relation: FamHxNeg          Age of Onset: (Not Specified)  Problem: Cancer - Colon      Relation: FamHxNeg          Age of Onset: (Not Specified)  Problem: Cancer - Lung      Relation: FamHxNeg          Age of Onset: (Not Specified)  Problem: Cancer - Ovarian      Relation: FamHxNeg          Age of Onset: (Not Specified)    (Not in a hospital admission)      Review of Systems:  Gen- Negative for fevers, chills, recent illnesses.      Physical Exam:   Interpreter-declined, used English effectively  BP 123/80    Pulse 90    Temp 98.7 F (37.1 C)    Resp 18    LMP 10/17/2006    SpO2 99%   Pain Score: Data Unavailable          Gen-no apparent distress, alert and oriented 3    HEENT-  head is normocephalic atraumatic, sclerae anicteric bilaterally    SKIN- skin is warm and dry to the touch.  No rashes, lesions, erythema at the left thumb but she does have some mild effusion left thumb basal joint.    Musculoskeletal-  Patient is right handed.  RUE: In the right hand she has full range of motion.  Right middle finger flexor tendon sheath is nontender.  Right thumb basal joint is nontender.    LUE: At the left thumb she has swelling and tenderness to palpation at the left thumb basal joint.  Pain with a grind test but no crepitus.  Left thumb FPL tendon is nontender at the A1 pulley.  Otherwise full range of motion left thumb.  No other localized tenderness elsewhere in the left hand.  2+ radial pulse, sensation is intact in the median, radial and ulnar nerve distribution.    Imaging-we reviewed 3 views of the right and 3 views of the  left hand dated 02/10/2019.  Both of them she has some narrowing at the basal joints of the thumbs.  A little bit more so on the left.  No fractures appreciated.  All pertinent imaging was also reviewed by Dr. Joylene John today.    A/P- This is a 53 year old year old female with left thumb basal joint arthritis and pain.  The diagnosis and treatment options are explained.  She declined a MetaGrip splint but prefers to buy it online.  She is instructed how often to wear it.    She also requested a cortisone injection for the left thumb basal joint.  It was given in sterile fashion.  Injection was Depo-Medrol 20 mg mixed with Marcaine 0.5%, 0.25 cc.  She understands she can have these approximately every 4 months if needed.    She will follow-up with [place] as needed.    This patient was seen and examined with Dr. Joylene John today.  All of the patient's questions were answered.      Tory Emerald, PA-C, 03/25/2019

## 2019-03-26 MED ORDER — METHYLPREDNISOLONE ACETATE 20 MG/ML IJ SUSP
20.00 mg | Freq: Once | INTRAMUSCULAR | 0 refills | Status: AC
Start: 2019-03-26 — End: 2019-03-26

## 2019-03-26 NOTE — Progress Notes (Addendum)
53 yo female with left thumb basal joint arthritis. We have seen her in the past for trigger fingers, but today is c/o pain at the basal joint. An injection was given today, and she will order a metagrip splint on line.    Injection: Using aseptic technique, 20mg  of depomedrol mixed with 0.5% plain marcaine was injected into the basal joint of the left thumb. This was well tolerated and there were no complications.         Please see the full note of Florencia Reasons, [place].    MD ATTESTATION  I, Dr. Fredderick Severance, have seen and examined this patient on the date of service, confirmed key exam findings, reviewed any pertinent studies and images, and formulated the plan of care. I agree with the note as outlined above.    Estrella Deeds, MD, 03/25/2019                PATIENT/PROCEDURE VERIFICATION DOCUMENTATION    Correct patient: Yes  Correct procedure: Yes  Correct site, mark visible if applicable: Yes    Pre-procedure Vital Signs:  BP: N/A  P: N/A  R: N/A    Risks and Benefits reviewed: Yes  Side: Left  Correct position: Yes  Special equipment/implant(s) present, if applicable: Yes    Post-procedure Vitals Signs:  BP: N/A  P: N/A  R: N/A    Time-out completed, documented by provider doing procedure or designated team member:  Estrella Deeds, MD    03/26/2019    10:41 AM

## 2019-03-26 NOTE — Addendum Note (Signed)
Addended by: Fredderick Severance A. on: 03/26/2019 10:43 AM     Modules accepted: Orders, SmartSet

## 2019-05-28 ENCOUNTER — Other Ambulatory Visit: Payer: Self-pay | Admitting: Pediatrics

## 2019-06-09 ENCOUNTER — Telehealth (HOSPITAL_BASED_OUTPATIENT_CLINIC_OR_DEPARTMENT_OTHER): Payer: Self-pay | Admitting: Internal Medicine

## 2019-06-09 ENCOUNTER — Encounter (HOSPITAL_BASED_OUTPATIENT_CLINIC_OR_DEPARTMENT_OTHER): Payer: Self-pay | Admitting: Internal Medicine

## 2019-06-09 ENCOUNTER — Ambulatory Visit
Admission: RE | Admit: 2019-06-09 | Discharge: 2019-06-09 | Disposition: A | Discharge status: Home / Self Care | Payer: No Typology Code available for payment source | Source: Ambulatory Visit | Attending: Internal Medicine | Admitting: Internal Medicine

## 2019-06-09 ENCOUNTER — Other Ambulatory Visit: Payer: Self-pay

## 2019-06-09 ENCOUNTER — Ambulatory Visit (HOSPITAL_BASED_OUTPATIENT_CLINIC_OR_DEPARTMENT_OTHER): Payer: No Typology Code available for payment source | Admitting: Internal Medicine

## 2019-06-09 DIAGNOSIS — M5442 Lumbago with sciatica, left side: Secondary | ICD-10-CM | POA: Diagnosis not present

## 2019-06-09 DIAGNOSIS — K219 Gastro-esophageal reflux disease without esophagitis: Secondary | ICD-10-CM

## 2019-06-09 DIAGNOSIS — M47815 Spondylosis without myelopathy or radiculopathy, thoracolumbar region: Secondary | ICD-10-CM | POA: Insufficient documentation

## 2019-06-09 DIAGNOSIS — M47817 Spondylosis without myelopathy or radiculopathy, lumbosacral region: Secondary | ICD-10-CM | POA: Insufficient documentation

## 2019-06-09 DIAGNOSIS — G8929 Other chronic pain: Secondary | ICD-10-CM | POA: Diagnosis not present

## 2019-06-09 DIAGNOSIS — K2100 Gastro-esophageal reflux disease with esophagitis, without bleeding: Secondary | ICD-10-CM | POA: Diagnosis not present

## 2019-06-09 MED ORDER — TRAMADOL HCL 50 MG PO TABS
50.00 mg | ORAL_TABLET | Freq: Four times a day (QID) | ORAL | 0 refills | Status: AC | PRN
Start: 2019-06-09 — End: 2019-06-16

## 2019-06-09 NOTE — Progress Notes (Addendum)
[facility]  Rheumatology Follow-Up Patient Note    Date of Visit: 06/09/2019    Patient: Deborah [name]    PCP: Helane Gunther, APRN    Primary Language: [place] [name]  Interpreter: Patient declined.     Reason for Visit:  Osteoarthritis of hands, trigger finger.  Last seen November 2020.    History of present illness:  Deborah [name] who is a 54 year old [place] and [place]- speaking female with bilateral knee osteoarthritis, nonalcoholic steatohepatitis, history of H. pylori gastritis in 2008 treatment, history of severe esophagitis seen on most recent EGD 2014, former smoker.    She was seen during initial consultation June 06, 2017 for evaluation of inflammatory arthritis in the setting of polyarthralgia and low titer rheumatoid factor of 1-2.  At the end of that encounter, polyarthralgia was deemed to be due to mechanical causes including bilateral knee osteoarthritis, right shoulder pain due to osteoarthritis versus rotator cuff tendinopathy; right lateral epicondylitis, CMC osteoarthritis and right hand third digit stenosing tenosynovitis.  During this initial visit, she received corticosteroid injection to the right subacromial bursa as well as the right third palmar aspect of the MCP of the hand for stenosing tenosynovitis.  Given her history of esophagitis and severe allergy to NSAIDs we recommended topical Voltaren gel.    She presents today in follow-up.    She was seen by Dr. Fredderick Severance December 2020 who injected the left [facility].  The patient feels 70% better as long as she is not overusing her left hand.  Also states that triggering has been recently better has not received any injections.  When she was seen by Dr. Fredderick Severance there is no comment on neck steps for her triggering.    Today she complains of left-sided back pain with radiation to the knee for the last 10 days.  Denies any trauma, injury or fall.  Denies any  radiation.  Denies any falling tripping or slipping.  Symptoms are sometimes worse with coughing.    Does not take NSAIDs.  Is not on Tylenol be effective.      Rheumatologic Problem List:  1.  Bilateral knee osteoarthritis.  2.  Right shoulder pain likely due to osteoarthritis versus rotator cuff tendinitis versus bursitis.  3.  Right lateral epicondylitis.  4.  Hand osteoarthritis, CMC osteoarthritis.  5.  Right third digit of the hand stenosing tenosynovitis.  6.  Low titer rheumatoid factor 1:2.  7.  NSAID allergy.    Past medical history:  Patient Active Problem List:     Lump or mass in breast     Gastroesophageal reflux disease without esophagitis     Other specified gastritis     Abdominal pain, epigastric     Angioneurotic edema not elsewhere classified     Plantar Fasciitis, L     Leg pain     Tobacco abuse, in remission     Triggering of Finger, R 3rd     Right subacromial bursitis/rotator cuff tendinopathy     Chondromalacia patellae     Lower back pain     De Quervain's Tenosynovitis, R     CTS (Carpal Tunnel Syndrome), b/l     Family history of diabetes mellitus (DM)     Hypolipoproteinemia     Intermittent spinal claudication (HCC)     Vitamin D deficiency     Greater Trochanteric Bursitis, b/l     DDD (degenerative disc disease), lumbar  Obesity     Rapid Noctural Palpitations x 10 Seconds 2013.     Sweating     Microscopic hematuria     Reflux esophagitis     Lump of skin of lower extremity     Bilateral chronic knee pain     Bilateral arm pain     Positive occult stool blood test     Chronic right-sided low back pain with right-sided sciatica     SVT To 240 BPM March 2010. As Of 2017, Episodes q 2 Weeks, Usually Subside One Minute, Occ 15 Minutes.  Commonly Relieved With Vagal Maneuvers.     BMI 33.0-33.9,adult     Rapid Palpitations Only Once/Month, Lasting Seconds, Abolished by Valsalva, Aug 2018.      Polyarthralgia     Macular chorioretinal scar of left eye     Hyperopia of both eyes with  astigmatism and presbyopia     Osteoarthritis of both knees     Lateral epicondylitis of right elbow     Trigger middle finger of right hand     Primary osteoarthritis of first carpometacarpal joint of left hand     Right shoulder injury     Chronic right shoulder pain     Suspected COVID-19 virus infection    Past surgical history:  Past Surgical History:  No date: CESAREAN DELIVERY ONLY      Comment:  breech  No date: [place] BREAST SPECIMEN      Comment:  s/p fibroadenoma on the L,   No date: SEPTOPLASTY/SUBMUCOUS RESECJ W/WO CARTILAGE GRF    Allergies:  Review of Patient's Allergies indicates:   Naproxen                Swelling    Comment:Ed visit with angioedema   Ibuprofen               Other (See Comments)    Comment:Epigastric pain, throat swelling?             05/06/12 pt states uses sometimes without issue   Pollen extract-tree*    Runny Nose    Comment:HA, itchy watery eyes    Medications:  Current Outpatient Medications   Medication Sig    Cholecalciferol (VITAMIN D3) 50 MCG (2000 UT) TABS TAKE 1 TABLET BY MOUTH DAILY    diclofenac (VOLTAREN) 1 % GEL Gel APPLY TO BOTH HANDS, BOTH ELBOWS AND BOTH KNEES UP TO 4 TIMES A DAY. DO NOT EXCEED 32 GRAMS IN 24 HOURS    metoprolol (TOPROL-XL) 100 MG 24 hr tablet Take 1 tablet by mouth daily    Calcium Carb-Cholecalciferol (CALCIUM + D3) 600-200 MG-UNIT TABS TAKE 1 TABLET BY MOUTH TWO TIMES DAILY    omeprazole (PRILOSEC) 20 MG capsule Take 1 capsule by mouth 2 (two) times daily before meals    acetaminophen (TYLENOL) 500 MG tablet Take 500 mg by mouth every 6 (six) hours as needed for Pain     No current facility-administered medications for this visit.        Health Maintenance:  Last eye exam- 04/2017  Last dental exam- 5 months ago.   Last colonoscopy- NEVER  Last bone density- NEVER  PPD or QuantiFERON TB Gold- negative, but unsure of date    ZOSTER VACCINE(1 of 2) due on 03/15/2014  FECAL OCCULT BLOOD AGE 46+ due on 03/21/2018  MAMMOGRAPHY due on 02/14/2019  AWQ  Questionnaire due on 04/23/2019  PEG SCORE (CHRONIC PAIN) due on 04/23/2019  PAP SMEAR due on  06/30/2019  HPV SCREENING due on 06/30/2019  HEALTH CARE PROXY due on 04/23/2023  LIPID SCREENING due on 02/17/2024  TETANUS VACCINE(2 - Td) due on 03/08/2026  PHYSICAL EXAM Completed  HIV SCREENING Completed  HEP C SCREEN Completed  INFLUENZA VACCINE Completed  PNEUMOCOCCAL VACCINE SERIES (< 65) Completed; No procedure found.    ROS:  Review of Systems: Constitutional, Eyes, ENT/Mouth, Cardiovascular, Respiratory, GI, GU, Neuro, Psych, Heme/Lymph, Skin, Musculoskeletal, and Endocrine systems were reviewed and are NEGATIVE, except for what is documented in the note.    PE:  There were no vitals filed for this visit.  There is no height or weight on file to calculate BMI.  GENERAL: NAD. Normocephalic/Atraumatic. Alert and oriented to person, place, and time. Good eye contact. Full affect. Follows commands appropriately.  Well-groomed.    HEENT: Pupils were equal, round and reactive. Extraocular motions are intact.  Examination of the oral mucosa was deferred due to coronavirus pandemic.  [name] There is no scalp rash. No malar rash. Anicteric sclerae. Non-injected conjunctivae. No nystagmus.  Tympanic membranes are clear.  No parotid enlargement.   NECK: Supple. No appreciable cervical lymphadenopathy.  LUNGS: Clear to auscultation without wheezes, rhonchi, rales, or crackles.  CV: S1/S2 normal, without murmurs, gallops, or rubs.  ABDOMEN:  Bowel sounds in all four quadrants, soft, nontender, nondistended. No appreciable organomegaly.  EXTREMITIES: Warm and well-perfused without any sign of cyanosis, clubbing, or edema.  SKIN: No rashes, nodules, bruising, hyperpigmentation, hypopigmentation, telangiectasias, periungal erythema, splinter hemorrhages, sclerodermatous changes, icterus, petechiae, palpable purpura, tethering, striae, keloid, nail abnormalities or tophi.    MUSCULOSKELETAL:   Hypertrophy of PIPs noted.   Grip  strength is normal bilaterally.  Squaring of the left CMC noted .  No swelling in the wrists.    Unable to provoke any triggering in the fingers today.  No swelling in elbows. No swelling in the shoulders.  No restriction in range of motion in fingers, wrists, elbows, or shoulders.   No tender joints in the fingers, wrists, or shoulders.    There is tenderness of the right lateral epicondyle.  No pain over the sternoclavicular joint, AC joint or TMJs.   No restriction in ROM of the cervical spine.  No focal back tenderness.    Positive straight leg raise on the left side.  Negative logroll bilaterally.    No restriction in range of motion in hips, knees, or ankles.   No swelling in the knees or ankles.  Bilateral valgus deformity of the knees with hypertrophic changes and crepitations.  No Achille's swelling or tenderness.  There is no tenderness on palpation of the MTPs.    No dactylitis or splaying of the toes.  NEURO: Cranial nerves II-XII intact. Patellar reflexes, Achilles reflexes and brachioradialis reflexes are 2/2 and symmetric. Narrow-based gait.  Power is 5/5 in all four limbs and without weakness. No difficulty getting up from seated position without using hands or out of a supine position.  No assistive ambulatory devices.    LABORATORY DATA:  Blood Counts  Lab Results   Component Value Date    WBC 8.6 02/17/2019    HGB 12.5 02/17/2019    HCT 38.3 02/17/2019    MCV 88.7 02/17/2019    PLTA 227 02/17/2019      Chemistries  Lab Results   Component Value Date    CA 8.9 12/04/2017    NA 143 12/04/2017    K 3.9 12/04/2017    CO2 28 12/04/2017  CL 107 12/04/2017    BUN 13 12/04/2017    CREAT 0.8 02/17/2019     CrCl cannot be calculated (Patient's most recent lab result is older than the maximum 7 days allowed.).    Liver Function Tests  Lab Results   Component Value Date    ALT 47 (H) 02/17/2019    AST 34 02/17/2019    ALBUMIN 4.2 02/17/2019    ALKPHOS 117 04/06/2017     Acute Phase Reactants  Lab Results    Component Value Date    ESR 12 01/31/2017    CRP 0.8 02/17/2019     Rheumatologic Labs    Lab Results   Component Value Date    RF POSITIVE (A) 02/17/2019   Rheumatid Factor (01/2017): 1:2    Lab Results   Component Value Date    CCPAB 0.7 02/17/2019     Other Labs  Lab Results   Component Value Date    HGBA1C 5.0 02/17/2019     Lab Results   Component Value Date    TSHSC 1.793 02/17/2019     VITAMIN D,25 HYDROXY (ng/mL)   Date Value   02/17/2019 17 (*L)     Lab Results   Component Value Date    CKTOTAL 95 06/16/2008     Lab Results   Component Value Date    LDL 121 02/17/2019     HEPATITIS B SURFACE ANTIGEN (no units)   Date Value   04/04/2006 NON-REACTIVE     HEPATITIS B SURFACE ANTIBODY (no units)   Date Value   04/04/2006 NEGATIVE     HEPATITIS C ANTIBODY (no units)   Date Value   04/04/2006 NEGATIVE     Imaging reviewed personally by me today:    Right Hand Xray (02/10/2019): Bones and Joints: There is mild joint space narrowing and particular osteophytes of the distal interphalangeal joints. There are no erosions.   Soft Tissues: Unremarkable.    Impression:   Osteoarthritis.    Left Hand Xray (02/10/2019):  Bones and Joints: There is mild joint space narrowing and particular osteophytes of the distal interphalangeal joints and thumb basal joint. There are no erosions.   Soft Tissues: Unremarkable.    Impression:   Mild osteoarthritis is unchanged.       Bilateral knee x-rays (12/11/2017): Consistent with osteoarthritis.      Left Hand Xray (February 2019):  Left hand x-ray consistent with osteoarthritis.     Chest Xray (11/2016):  No pneumothorax or pneumonia.     Right Ankle Xray (01/2015):  Lateral ankle soft tissue swelling. No fracture.     Bilateral Knee Xray (03/2013):  1. Tiny marginal osteophytes along the right medial tibiofemoral   compartment. 2. Normal alignment and preservation of the cartilage spaces.     Right Hand Xray (02/2008):  Unremarkable right hand radiographs.    Lumbar Xray  (10/2007):  Normal alignment of the vertebral bodies. Vertebral body heights are well  maintained. An peritumoral disc spaces are normal. No acute fracture,  subluxation or dislocation. No evidence of facet disease. Evidence of   coarse interarticularis defect. SI joints are normal.    Assessment: Mellany Dinsmore is a 54 year old [place] speaking female with severe esophagitis, allergy to NSAID, CMC osteoarthritis, hand also arthritis, bilateral knee osteoarthritis, chronic lower back pain likely degenerative disc disease.    Plan/Recommendations:  1.  Right third digit of the hand stenosing tenosynovitis, left CMC osteoarthritis.  Currently patient is asymptomatic.  She will follow Dr. Fredderick Severance hand surgery.  We will hold on hydroxychloroquine for now as was previously discussed we would consider this for off label use for osteoarthritis.    2.  Left sided back pain with radicular features.  Patient referred to physical therapy.  Lumbar x-ray ordered.  Patient is not better in 4 weeks which will be about 6 weeks if symptoms began would likely order MRI and refer to physiatry.  Patient unable to tolerate oral NSAIDs due to esophagitis and Tylenol ineffective.  She may take tramadol 50 mg 4 times a day as needed advised not to operate heavy machinery or drive.    3.  History of NSAID allergy with what sounds like angioedema.  I did advise the patient that she should avoid oral NSAIDs given previous history of allergic reaction.    HEALTH MAINTENANCE: N/A  EDUCATION:  The total face to face time with patient was 25 minutes.  Greater than 50% of that time was spent on counseling/educating patient regarding potential etiologies, pathophysiology, treatment options, need for further work-up including blood work, imaging and referrals, reviewing previous lab results and radiology findings, reviewing internal and external medical records, and coordinating medical care, outside of the time needed to  perform any procedures.    1.  In accordance with the opiate bill enacted on 06/22/14, the patient's condition is such that a non-opiate medication was not appropriate and:  1. The treatment condition associated with the prescribing of an opiate/opioid medication is due to addiction treatment, acute condition, chronic pain, cancer, or palliative care.    2. I have evaluated the patients current condition, risk factors, history of substance abuse, if any, and current medications.  3. I have informed the patient that the prescribed medication is an appropriate course of treatment based on the medical need of the patient.    4. I have consulted with the patient regarding the quantity of the opiate prescribed and informed the patient of her right to fill the prescription in a lesser quantity.  5. I have expressly informed the patient of the risks associated with the opiate prescribed.  6. If this patient is prescribed an extended-release opiate, the patient and I have signed a Controlled Substance Agreement.  7. I or an authorized delegate have checked the Prescription Monitoring Program ([place]) for this patient.       REMINDERS FOR NEXT VISIT: N/A    I discussed the likely diagnosis(es),  prognosis(es), and the various treatment options in detail with the patient, including observation.     Risks and benefits of the treatment plan(s) were discussed.     The patient's questions have been addressed and answered.     We discussed importance of medication compliance.     The patient was ready to learn and no apparent learning barriers were identified.     Follow-up in 4 weeks face to face, earlier if needed.    Deborah [name] verbally expressed an understanding and agreement with the plan as outlined above.    This dictation was made using voice recognition software.  There is potential for transcription errors and incorrect word usage based on limitations of the software.      Yeraldi Fidler Adolf-Ubokudom, MD, Julious Payer.      Imaging reviewed.  Lumbar degenerative changes seen.  Also some calcified granulomas incidentally found in the soft tissue of the buttocks.  Will need to have dermatology or primary care evaluate skin.  Nurse to call patient.  Letter sent.      Lumbar Xray (06/09/2019):  Vertebrae: The vertebrae are normal in height. There is no fracture or suspicious lesions. Pedicles and sacroiliac joints are intact     Disk spaces: There is disc space narrowing at T12-L1. There is facet hypertrophy at L5-S1.     Alignment: The vertebral alignment is normal.     Soft tissues: Multiple calcified granulomata are seen in the soft tissues of the buttocks      Impression:     Degenerative changes at T12-L1 and at L5-S1           Reviewed and Electronically Signed by: Eda Paschal MD   Signed Date/Time: 06-09-2019 16:09:13

## 2019-06-09 NOTE — Telephone Encounter (Signed)
Imaging reviewed.  Lumbar degenerative changes seen.  Also some calcified granulomas incidentally found in the soft tissue of the buttocks.  Will need to have dermatology or primary care evaluate skin.  Nurse to call patient.  Letter sent.      Lumbar Xray (06/09/2019):  Vertebrae: The vertebrae are normal in height. There is no fracture or suspicious lesions. Pedicles and sacroiliac joints are intact     Disk spaces: There is disc space narrowing at T12-L1. There is facet hypertrophy at L5-S1.     Alignment: The vertebral alignment is normal.     Soft tissues: Multiple calcified granulomata are seen in the soft tissues of the buttocks      Impression:     Degenerative changes at T12-L1 and at L5-S1

## 2019-06-10 NOTE — Telephone Encounter (Signed)
Called pt per Dr. Margaretmary Dys request, informed pt of MD's message.  Per Dr. Margaretmary Dys pt was informed that:   Imaging reviewed.  Lumbar degenerative changes seen.  Also some calcified granulomas incidentally found in the soft tissue of the buttocks.  Will need to have dermatology or primary care evaluate skin.  Nurse to call patient.  Letter sent.      Lumbar Xray (06/09/2019):  Vertebrae: The vertebrae are normal in height. There is no fracture or suspicious lesions. Pedicles and sacroiliac joints are intact     Disk spaces: There is disc space narrowing at T12-L1. There is facet hypertrophy at L5-S1.     Alignment: The vertebral alignment is normal.     Soft tissues: Multiple calcified granulomata are seen in the soft tissues of the buttocks      Impression:     Degenerative changes at T12-L1 and at L5-S1        Pt verbalized understanding and had no further questions.

## 2019-06-27 ENCOUNTER — Telehealth (HOSPITAL_BASED_OUTPATIENT_CLINIC_OR_DEPARTMENT_OTHER): Payer: Self-pay

## 2019-06-27 DIAGNOSIS — U071 COVID-19: Secondary | ICD-10-CM

## 2019-06-27 NOTE — Telephone Encounter (Signed)
[place] RESULT:    I have confirmed the patient's name and DOB. Yes     Emergency care:    1. Is the patient patient gasping for air or unable to speak? No  2. Does the patient have new severe chest pain? No  3. Is the patient lethargic or does the patient have altered mental status?  No    A) COVID symptoms:    Date and location of positive test: 3/15 in [place]    Does the patient have any of the following:    1. Fever? No  2. New or worsening cough? Yes  3. New or worsening shortness of breath? No- states her oxygen is 97%  4. New myalgias (muscle aches)? No  5. Anosmia (lack of smell/taste)? No  6. New sore throat? No  7. New runny nose or congestion? No  8. New vomiting or diarrhea? No    First day of symptoms:   3/10  Current day of symptoms: 9    First day of dyspnea:     If dyspnea, duration of dyspnea: N/A    If fever, duration of fever: N/A    Other symptoms: headache    C) Need for in-person evaluation:    1. Is the patient experiencing respiratory symptoms (dypsnea, orthopnea, dyspnea on exertion, wheezing), worsening respiratory symptoms from baseline, or worsening of a chronic cough? No  2. Has the patient had a fever for > 48 hours? No  3. Is the patient at [place] ? 4 with worsening symptoms? No  4. Does the patient have chest pain (note: new severe chest pain must be referred to ED)? No  5. Is the patient dizzy or lightheaded? No  6. Does the patient have severe sore throat? No  7. Is the patient pregnant? No    Disposition:    1. I have provided the patient with Home Care Advice (use [facility]OM[facility]). Yes   2. I have given the patient directions to the [facility] (ACCDIRECTIONS), if appropriate. N/A  3. If the patient has an in-person appointment scheduled in the next 14 days (other than [facility]), I have instructed the patient that she should not attend, and have sent a message to the pertinent front desk pool:  Yes.    Final disposition: Patient requires further  provider evaluation, message sent to COVID Provider Results     [place]:    The following instructions are intended for patients who have been have Covid, have a Covid test pending, or who may have Covid but cannot be tested.    Treating symptoms and anticipatory guidance:     There are no specific therapies for Covid (coronavirus) in the outpatient setting.   Continue to take your prescribed medicines.   Do not take other medicines unless they have been prescribed.   Rest and take plenty of fluids.   For symptomatic relief, use acetaminophen or ibuprofen, as long as you have not been told you cannot take these medicines.   Patients with worsening symptoms should:  ? Call [phone] M[facility] 8-5 or their home clinic phone number.  ? Call 911 in an emergency.    Self-isolation and self-quarantine precautions:    The following advice applies to all patients who have been tested for Covid while their result is pending, and to all patients who test positive for Covid.     Self-isolation directions for patients:  ? You should not leave home -- no  work, school, public places, stores, public transportation. No exceptions except emergencies.  ? If possible, stay in one room of the house by yourself, ideally with a window open. Use a separate bathroom if available. Household members should sleep in a separate room.  ? Duration of self-isolation: AT LEAST 10 days since first day of symptoms AND at least 24 hours since resolution of fever without antipyretics AND improvement of other symptoms. If you were hospitalized for COVID, we recommend that you stay home for at least 20 days from your first day of symptoms (this time includes the time you were in the hospital).   Self-quarantine directions for household members and close contacts:  ? Patients should notify all close contacts that they have symptoms of Covid. Close contacts include anyone with whom the patient spent significant time (> 15 minutes in a  24-hour period, less than 6 feet apart) since becoming symptomatic. This includes co-workers in [employer], etc. Close contacts must also self-quarantine.  ? All close contacts should be tested. Even if they test negative, they must continue to quarantine. If at any time they develop symptoms, they should be retested.  ? All household members should stay home for 14 days from the patient's first date of symptoms (or first day of positive test, if asymptomatic) -- no work, school, public places, stores, public transportation. Note: If contacts are unable to isolate from the Covid patient, they must quarantine for 14 days from the last day that the patient with Covid is contagious (i.e.: quarantine for 10 days while the patient is contagious, and then 14 more days).  ? If you must end quarantine at Day 10, you may do so if you are a) asymptomatic and b) continue to monitor your symptoms for a total of 14 days. You do not need to test.  ? If you end quarantine at Day 7, you may do so only if you are a) asymptomatic, b) have a negative test at Day 5 or later), and c) continue to monitor your symptoms for a total of 14 days. A 14-day quarantine is still the safest option. [facility] cannot provide testing to end quarantine early. You can find other testing sites at [email or web address]     Additional instructions:     At home:   Prohibit all visitors unless absolutely necessary.   Wash hands frequently for at least 20 seconds with soap and water or alcohol-based sanitizer (soap and water preferred if hands visibly dirty).   Avoid touching your face/mouth/nose/eyes and cough or sneeze into a tissue, NOT your hand.   If you must share a room with other people, wear a facemask when sharing a room with other people.   Clean all high-touch surfaces every day (counters, tabletops, doorknobs, bathroom fixtures like faucets, toilets, phones, bedside tables, lamps and lightswitches). Avoid sharing personal  items.   Note: If an entire household has tested positive for Covid, it is not necessary to stay in separate rooms or wear masks.   Once it is safe for you to leave your home:   Do not attend large gatherings with people outside your household.   Wear a mask in public and stay 6 feet away from others.

## 2019-06-27 NOTE — Telephone Encounter (Signed)
COVID RESULT CALL AND ASSESSMENT:    Called patient to discuss COVID-19 result.    Result: POSITIVE.    First day of symptoms:   06/18/19    Description of symptoms: Tested positive at outside location (3/15) - she called the triage line today for more information.   - Symptoms have improved, feels very well today - only sx is nasal congestion  - Initially had sore throat, fever, tmax - 100.2  - Felt flu like sx 3/14 now have improve  - Cough started 3/17 which has since resolved   - OTC medications including tylenol, teraflu   - Denies SOB, DOE, respiratory sx, current fever, N/V/D  - Home SPO2 97%  - 1st dose of vaccine march 4th. 2nd dose scheduled for 3/27.     First day of dyspnea (if any, otherwise type N/A):   n/a    Worsening dyspnea?  N/A    If no dyspnea, calculate Roth score: Ask the patient to take a deep breath and count from 1 to 30. Time the patient.   Counting number (number reached): n/a   Number of seconds counting: n/a    Is the patient self-isolating?  Yes  Household members and whether they are sick or have been tested: parents have both of their vaccines    Medical history: obesity,     Assessment:    1. Current day of symptoms: 9  2. Roth score, if performed:  a. Counting number < 10? no  b. Number of seconds counted < 7? no    Plan:    1. The patient requires [facility] evaluation (consider for any new respiratory symptom and worsening symptoms particularly DOI > 4): No  2. Secondary diagnosis (e.g. bacterial PNA) suspected: No  3. Based on my assessment of the patient's risk, I am referring the patient to Community Management: No    Symptomatic treatment discussed including rest, hydration, and alternating ibuprofen and tylenol. Advised to not exceed 4g tylenol and 3200mg  ibuprofen daily. [facility] phone number provided for any new or worsening of symptoms.   - Receive 2nd dose of vaccine on 07/13/19      Counseling:    1. Home care advice (can use [facility]OM[facility])    a. Self isolation:   i. Advised patient  she should stay home for 10 days and until she has experienced symptomatic improvement and has been afebrile without antipyretics for 24 hours: Yes  ii. Inquired whether patient can safely isolate at home and given number for [facility] isolation sites if appropriate (anyone can call 7am-7pm) [phone]: Yes  iii. Last day of isolation: 06/28/19  b. Contacts:  i. Identified all close contacts -- all household members and anyone with whom you have spent more than 15 minutes from up to 2 days before you developed symptoms or tested positive:  Yes  ii. All close contacts should be tested:   Yes  iii. All close contacts should quarantine for 14 days from the last date of contact (note: it is possible to quarantine for 10 days, even without retesting, for patients who remain asymptomatic and can continue to monitor their symptoms for 14 days): Yes    2. Food insecurity:    1. We know it is difficult for our patients to pay for medications, utilities, rent and food during this time. Are you worried about paying for any of those essential needs in the next month? No  2. Would free food delivery for the next 2 weeks help you  pay for those other essential needs? No  3. Can we share your name, phone, address, preferred language, and number of people in your household with a community group that will call you to get free food delivery set up? N/A    If patient agrees to sharing their information with a community based organization, please inform the patient that someone will reach out to them to set the food delivery up and confirm the number of people in the patient's household:     4. Number of people in household: N/A    3.   Would you be interested in speaking to someone at [facility] about a clinical trial of a possible therapy for COVID? No    4.   Offered letter for work or school and routed to [place]: No    5.   Advised patient that if she develops new symptoms, she should call [facility] 8-5  or her clinic at [phone]: Yes    Disposition: Does not require in-person evaluation or CM, anticipatory guidance given and chart routed to PCP

## 2019-07-02 ENCOUNTER — Ambulatory Visit (HOSPITAL_BASED_OUTPATIENT_CLINIC_OR_DEPARTMENT_OTHER): Payer: No Typology Code available for payment source | Admitting: Rehabilitative and Restorative Service Providers"

## 2019-07-08 ENCOUNTER — Ambulatory Visit (HOSPITAL_BASED_OUTPATIENT_CLINIC_OR_DEPARTMENT_OTHER): Payer: No Typology Code available for payment source | Admitting: Internal Medicine

## 2019-07-11 ENCOUNTER — Other Ambulatory Visit: Payer: Self-pay

## 2019-07-11 ENCOUNTER — Ambulatory Visit: Payer: No Typology Code available for payment source | Attending: Internal Medicine | Admitting: Internal Medicine

## 2019-07-11 VITALS — BP 136/90 | HR 82 | Temp 97.8°F | Ht 61.0 in | Wt 187.0 lb

## 2019-07-11 DIAGNOSIS — U071 COVID-19: Secondary | ICD-10-CM | POA: Diagnosis present

## 2019-07-11 DIAGNOSIS — Z6833 Body mass index (BMI) 33.0-33.9, adult: Secondary | ICD-10-CM | POA: Diagnosis not present

## 2019-07-11 DIAGNOSIS — I471 Supraventricular tachycardia: Secondary | ICD-10-CM | POA: Diagnosis not present

## 2019-07-11 NOTE — Progress Notes (Signed)
OUTPATIENT CARDIOLOGY PROGRESS NOTE  Date of visit: 07/11/2019    Deborah [name] is a 54 year old [place] mother of 3, a 56 year old son, who lives with her and grad'd from [employer], and has started his training in plumbing.  Her problem list includes GERD, rapid palpitations, SVT to 240 seen in 2010, and wheezing.     In summary patient is 54 year old female with the following medical problems:  BMI 33.0-33.9,adult  Covid Infection March 2021 -- Ill But Not Hosp'd.  SVT To 240 BPM March 2010. As Of 2017, Episodes q 2 Weeks, Usually Subside One Minute, Occ 15 Minutes.  Commonly Relieved With Vagal Maneuvers.  SVT To 240 BPM March 2010       Cholecalciferol (VITAMIN D3) 50 MCG (2000 UT) TABS, TAKE 1 TABLET BY MOUTH DAILY, Disp: 30 tablet, Rfl: 11  diclofenac (VOLTAREN) 1 % GEL Gel, APPLY TO BOTH HANDS, BOTH ELBOWS AND BOTH KNEES UP TO 4 TIMES A DAY. DO NOT EXCEED 32 GRAMS IN 24 HOURS, Disp: 100 g, Rfl: 2  metoprolol (TOPROL-XL) 100 MG 24 hr tablet, Take 1 tablet by mouth daily, Disp: 90 tablet, Rfl: 3  Calcium Carb-Cholecalciferol (CALCIUM + D3) 600-200 MG-UNIT TABS, TAKE 1 TABLET BY MOUTH TWO TIMES DAILY, Disp: 60 tablet, Rfl: 11  omeprazole (PRILOSEC) 20 MG capsule, Take 1 capsule by mouth 2 (two) times daily before meals, Disp: 60 capsule, Rfl: 11  acetaminophen (TYLENOL) 500 MG tablet, Take 500 mg by mouth every 6 (six) hours as needed for Pain, Disp: , Rfl:     No current facility-administered medications on file prior to visit.      Review of Patient's Allergies indicates:   Naproxen                Swelling    Comment:Ed visit with angioedema   Ibuprofen               Other (See Comments)    Comment:Epigastric pain, throat swelling?             05/06/12 pt states uses sometimes without issue   Pollen extract-tree*    Runny Nose    Comment:HA, itchy watery eyes    SOCIAL & FAMILY HISTORY: Were reviewed and are unchanged from my previous note.    Review of systems: All other systems reviewed and  are negative except for the issues cited below.     PERTINENT INVESTIGATIONS:    1. EKG: (on my personal review)  04 Dec 2017:  NSR 75.  NORMAL.    2. LABS:   Lab Results   Component Value Date    NA 143 12/04/2017    K 3.9 12/04/2017    CL 107 12/04/2017    CO2 28 12/04/2017    BUN 13 12/04/2017    CREAT 0.8 02/17/2019    GLUCOSER 99 12/04/2017     Lab Results   Component Value Date    LDL 121 02/17/2019    HDL 44 02/17/2019    TG 197 (H) 02/17/2019     No results found for: PROBNP  No results found for: BNP    3. ECHO:     4. STRESS TEST:     5. HOLTER:    136/90.  82.  Afeb.  5'1", 187 lbs, BMI 35.    PHYSICAL EXAM:  General:  Alert and comfortable.  There were no vitals filed for this visit.  HEENT: Ocular muscles are intact and the  face is symmetric with midline tongue.  NECK:  No palpable cervical nodes or thyroid, with no JVD.  CHEST: Clear to auscultation, no crackles or rhonchi.  HEART: Regular with no  murmurs, clicks, or gallops/  ABDOMEN: No palpable organs, masses, or tenderness.  EXTREMITIES: No rash, clubbing, or cyanosis, with no edema.  NEURO: A&O X 3, no obvious motor or sensory deficits  PSYCHIATRIC: Mood and affect are appropriate.  SKIN: No rash.        ASSESSMENT:    (R00.2) Rapid Noctural Palpitations x 10 Seconds  Comment: 3 nights in a row in July 2019.  Prior sxs 2 months earlier.  Nothing since July.  Awakened by very rapid palps lasting a few minutes.  Subsided before she considered going to ED.  Valsalva with effect, occ needed to progress to gag with benefit.  No obvious precipitant.      Dec 2020 while in conversation she had rapid palps with body shaking, but no dizzy, sweating, no CP or SOB.  Did gag 3x and finally succeeded in abolishing it.      (I47.1) SVT To 240 BPM March 2010. As Of 2017, Episodes q 2 Weeks, Usually Subside One Minute, Occ 15 Minutes.  Commonly Relieved With Vagal Maneuvers.  Comment:  Seems likely to have been the arrhythmia she experienced last month.       (R00.2) Rapid Palpitations Only Once/Month, Lasting Seconds, Abolished by Valsalva, July 2019.   Comment: Continue use of Vagal maneuvers.  She's on metop 100/day    Had TSH <2 years ago when she already had arrhythmias -- no need to repeat.    Her K+ was 3.3 in ED last Dec -- recheck now.  Exam unremarkable.      Able to walk 2 floors of stairs.   Walks 3 blocks -- doesn't really need to walk more.  Housecleaner -- vigorous work.      Discussed ablation -- she's wants to hold off even though these events frighten her.  OK -- no change Rx. No change in this sentiment 11 July 2019.    No CP, SOB, dizzy, syncope, falling, edema.  Exam negative.    She is flourishing.  If sxs persist, reconsider ablation.  Use Valsalva/gag as needed.  See me one year.

## 2019-07-17 NOTE — Addendum Note (Signed)
Addended by: Clemetine Marker on: 07/17/2019 09:15 AM     Modules accepted: Orders

## 2019-07-19 LAB — EKG

## 2019-07-21 ENCOUNTER — Other Ambulatory Visit: Payer: Self-pay

## 2019-08-11 ENCOUNTER — Ambulatory Visit (HOSPITAL_BASED_OUTPATIENT_CLINIC_OR_DEPARTMENT_OTHER): Payer: Self-pay | Admitting: Urology

## 2019-09-22 ENCOUNTER — Other Ambulatory Visit (HOSPITAL_BASED_OUTPATIENT_CLINIC_OR_DEPARTMENT_OTHER): Payer: Self-pay | Admitting: Family Medicine

## 2019-09-22 NOTE — Telephone Encounter (Signed)
[facility] Pharm[facility], Deborah [name] is a 54 year old female has requested a refill of      Vit D3    Diclofenac          Last Office Visit: 02/13/2019   with pcp   Last Physical Exam: 03/13/2017     FECAL OCCULT BLOOD AGE 38+ due on 03/21/2018  PAP SMEAR due on 06/30/2019  HPV SCREENING due on 06/30/2019     Other Med Adult:  Most Recent BP Reading(s)  07/11/19 : 136/90        Cholesterol (mg/dL)   Date Value   02/17/2019 187     LOW DENSITY LIPOPROTEIN DIRECT (mg/dL)   Date Value   02/17/2019 121     HIGH DENSITY LIPOPROTEIN (mg/dL)   Date Value   02/17/2019 44     TRIGLYCERIDES (mg/dL)   Date Value   02/17/2019 197 (H)         THYROID SCREEN TSH REFLEX FT4 (uIU/mL)   Date Value   02/17/2019 1.793         TSH (THYROID STIM HORMONE) (uIU/mL)   Date Value   12/22/2010 1.31       HEMOGLOBIN A1C (%)   Date Value   02/17/2019 5.0       No results found for: POCA1C      INR (no units)   Date Value   06/15/2008 1.0 (L)       SODIUM (mmol/L)   Date Value   12/04/2017 143       POTASSIUM (mmol/L)   Date Value   12/04/2017 3.9           CREATININE (mg/dL)   Date Value   02/17/2019 0.8        Documented patient preferred pharmacies:    [facility], [place] - [place].  Phone: [phone] Fax: [phone]

## 2019-11-03 ENCOUNTER — Emergency Department
Admission: EM | Admit: 2019-11-03 | Discharge: 2019-11-03 | Disposition: A | Discharge status: Home / Self Care | Payer: No Typology Code available for payment source | Source: Intra-hospital | Attending: Emergency Medicine | Admitting: Emergency Medicine

## 2019-11-03 ENCOUNTER — Encounter (HOSPITAL_BASED_OUTPATIENT_CLINIC_OR_DEPARTMENT_OTHER): Payer: Self-pay

## 2019-11-03 ENCOUNTER — Other Ambulatory Visit: Payer: Self-pay

## 2019-11-03 ENCOUNTER — Emergency Department (HOSPITAL_BASED_OUTPATIENT_CLINIC_OR_DEPARTMENT_OTHER): Payer: No Typology Code available for payment source

## 2019-11-03 DIAGNOSIS — N200 Calculus of kidney: Secondary | ICD-10-CM | POA: Insufficient documentation

## 2019-11-03 DIAGNOSIS — N1330 Unspecified hydronephrosis: Secondary | ICD-10-CM | POA: Diagnosis not present

## 2019-11-03 DIAGNOSIS — R109 Unspecified abdominal pain: Secondary | ICD-10-CM | POA: Diagnosis present

## 2019-11-03 DIAGNOSIS — N201 Calculus of ureter: Secondary | ICD-10-CM | POA: Diagnosis not present

## 2019-11-03 LAB — COMPREHENSIVE METABOLIC PANEL
ALANINE AMINOTRANSFERASE: 37 U/L (ref 12–45)
ALBUMIN: 3.8 g/dL (ref 3.4–5.0)
ALKALINE PHOSPHATASE: 111 U/L (ref 45–117)
ANION GAP: 13 mmol/L (ref 5–15)
ASPARTATE AMINOTRANSFERASE: 34 U/L (ref 8–34)
BILIRUBIN TOTAL: 0.3 mg/dL (ref 0.2–1.0)
BUN (UREA NITROGEN): 20 mg/dL — ABNORMAL HIGH (ref 7–18)
CALCIUM: 9.2 mg/dL (ref 8.5–10.1)
CARBON DIOXIDE: 25 mmol/L (ref 21–32)
CHLORIDE: 107 mmol/L (ref 98–107)
CREATININE: 0.7 mg/dL (ref 0.4–1.2)
ESTIMATED GLOMERULAR FILT RATE: >60 mL/min (ref >60)
Glucose Random: 111 mg/dL (ref 74–160)
POTASSIUM: 4.6 mmol/L (ref 3.5–5.1)
SODIUM: 145 mmol/L (ref 136–145)
TOTAL PROTEIN: 7.7 g/dL (ref 6.4–8.2)

## 2019-11-03 LAB — CBC, PLATELET & DIFFERENTIAL
ABSOLUTE BASO COUNT: 0.1 10*3/uL (ref 0.0–0.1)
ABSOLUTE EOSINOPHIL COUNT: 0.1 10*3/uL (ref 0.0–0.8)
ABSOLUTE IMM GRAN COUNT: 0.04 10*3/uL — ABNORMAL HIGH (ref 0.00–0.03)
ABSOLUTE LYMPH COUNT: 4.2 10*3/uL (ref 0.6–5.9)
ABSOLUTE MONO COUNT: 1 10*3/uL (ref 0.2–1.4)
ABSOLUTE NEUTROPHIL COUNT: 5.4 10*3/uL (ref 1.6–8.3)
ABSOLUTE NRBC COUNT: 0 10*3/uL (ref 0.0–0.0)
BASOPHIL %: 0.6 % (ref 0.0–1.2)
EOSINOPHIL %: 1.2 % (ref 0.0–7.0)
HEMATOCRIT: 38.7 % (ref 34.1–44.9)
HEMOGLOBIN: 12.9 g/dL (ref 11.2–15.7)
IMMATURE GRANULOCYTE %: 0.4 % (ref 0.0–0.4)
LYMPHOCYTE %: 39.3 % (ref 15.0–54.0)
MEAN CORP HGB CONC: 33.3 g/dL (ref 31.0–37.0)
MEAN CORPUSCULAR HGB: 29.3 pg (ref 26.0–34.0)
MEAN CORPUSCULAR VOL: 88 fl (ref 80.0–100.0)
MEAN PLATELET VOLUME: 10 fL (ref 8.7–12.5)
MONOCYTE %: 8.8 % (ref 4.0–13.0)
NEUTROPHIL %: 49.7 % (ref 40.0–75.0)
NRBC %: 0 % (ref 0.0–0.0)
PLATELET COUNT: 229 10*3/uL (ref 150–400)
RBC DISTRIBUTION WIDTH STD DEV: 39.4 fL (ref 35.1–46.3)
RED BLOOD CELL COUNT: 4.4 M/uL (ref 3.90–5.20)
WHITE BLOOD CELL COUNT: 10.8 10*3/uL (ref 4.0–11.0)

## 2019-11-03 LAB — URINALYSIS RFLX TO URINE CULT
BILIRUBIN, URINE: NEGATIVE
CASTS: NONE SEEN PER LPF
CRYSTALS: NONE SEEN
GLUCOSE, URINE: NEGATIVE MG/DL
KETONE, URINE: NEGATIVE MG/DL
LEUKOCYTE ESTERASE: NEGATIVE
NITRITE, URINE: NEGATIVE
PH URINE: 5 (ref 5.0–8.0)
RENAL EPITHELIAL CELLS: NONE SEEN PER LPF
SPECIFIC GRAVITY URINE: 1.024 (ref 1.003–1.035)
SQUAMOUS EPITHELIAL CELLS: >10 PER LPF — AB (ref 0–4)
TRANSITIONAL EPITHELIALS: NONE SEEN PER LPF (ref 0–2)

## 2019-11-03 LAB — POC URINALYSIS
BILIRUBIN, URINE: NEGATIVE
GLUCOSE,URINE: NEGATIVE
KETONE, URINE: NEGATIVE
LEUKOCYTE ESTERASE: NEGATIVE
NITRITE, URINE: NEGATIVE
PH URINE: 5 (ref 5.0–8.0)
PROTEIN, URINE: 30 — AB
SPECIFIC GRAVITY, URINE: >=1.03 (ref 1.003–1.030)
UROBILINOGEN URINE: 0.2 (ref 0.2–1.0)

## 2019-11-03 LAB — MAGNESIUM: MAGNESIUM: 2 mg/dL (ref 1.8–2.4)

## 2019-11-03 LAB — URINE PREGNANCY TEST (POINT OF CARE): HCG QUALITATIVE URINE: NEGATIVE

## 2019-11-03 LAB — URINE CULTURE WILL NOT BE DONE

## 2019-11-03 MED ORDER — ONDANSETRON 4 MG PO TBDP
4.00 mg | ORAL_TABLET | Freq: Three times a day (TID) | ORAL | 0 refills | Status: AC | PRN
Start: 2019-11-03 — End: 2019-11-06

## 2019-11-03 MED ORDER — FAMOTIDINE 20 MG/2ML IV SOLN
20.00 mg | Freq: Once | INTRAVENOUS | Status: AC
Start: 2019-11-03 — End: 2019-11-03
  Administered 2019-11-03: 20 mg via INTRAVENOUS
  Filled 2019-11-03: qty 2

## 2019-11-03 MED ORDER — MORPHINE SULFATE 4 MG/ML IV SOLN (SUPER ERX)
4.0000 mg | Freq: Once | Status: DC
Start: 2019-11-03 — End: 2019-11-03

## 2019-11-03 MED ORDER — ACETAMINOPHEN 500 MG PO TABS
1000.0000 mg | ORAL_TABLET | Freq: Once | ORAL | Status: AC
Start: 2019-11-03 — End: 2019-11-03
  Administered 2019-11-03: 1000 mg via ORAL
  Filled 2019-11-03: qty 2

## 2019-11-03 MED ORDER — SODIUM CHLORIDE 0.9 % IV BOLUS
1000.0000 mL | Freq: Once | INTRAVENOUS | Status: AC
Start: 2019-11-03 — End: 2019-11-03
  Administered 2019-11-03: 1000 mL via INTRAVENOUS

## 2019-11-03 MED ORDER — MORPHINE SULFATE 10 MG/ML IV SOLN (SUPER ERX)
6.00 mg | Freq: Once | Status: AC
Start: 2019-11-03 — End: 2019-11-03
  Administered 2019-11-03: 6 mg via INTRAVENOUS
  Filled 2019-11-03: qty 1

## 2019-11-03 MED ORDER — OXYCODONE HCL 5 MG PO TABS
5.00 mg | ORAL_TABLET | ORAL | 0 refills | Status: AC | PRN
Start: 2019-11-03 — End: 2019-11-06

## 2019-11-03 MED ORDER — IBUPROFEN 400 MG PO TABS
400.00 mg | ORAL_TABLET | Freq: Once | ORAL | Status: AC
Start: 2019-11-03 — End: 2019-11-03
  Administered 2019-11-03: 400 mg via ORAL
  Filled 2019-11-03: qty 1

## 2019-11-03 MED ORDER — ONDANSETRON HCL 4 MG/2ML IJ SOLN
4.0000 mg | Freq: Once | INTRAMUSCULAR | Status: AC
Start: 2019-11-03 — End: 2019-11-03
  Administered 2019-11-03: 4 mg via INTRAVENOUS
  Filled 2019-11-03: qty 2

## 2019-11-03 MED ORDER — MORPHINE SULFATE 4 MG/ML IV SOLN (SUPER ERX)
4.0000 mg | Freq: Once | Status: AC
Start: 2019-11-03 — End: 2019-11-03
  Administered 2019-11-03: 4 mg via INTRAVENOUS
  Filled 2019-11-03: qty 1

## 2019-11-03 NOTE — Narrator Note (Signed)
Pt reports 8/10 pain   No nausea, no vomiting

## 2019-11-03 NOTE — Narrator Note (Signed)
Blanket provided  Pt transported to CT scan by stretcher.   Alert and oriented

## 2019-11-03 NOTE — Narrator Note (Signed)
Instructions reviewed with pt by MD and RN. Pt states understanding. Strainer provided. Pt is wheeled out to waiting room to await son.

## 2019-11-03 NOTE — ED Triage Note (Signed)
Patient self presents to ER complaining of left flank pain radiating to back, N/V since about 6 AM this morning.

## 2019-11-03 NOTE — Narrator Note (Signed)
Pt ambulated to bathroom with steady gait. 

## 2019-11-03 NOTE — Discharge Instructions (Signed)
Ms. Deborah [name], you were seen in the emergency department today for left side pain.  It appears you have a 5 mm kidney stone causing your symptoms.  Your urine is not infected.  A kidney stone of this size is expected to pass on its own and usually does not require surgery or other operations.  It may take several days to pass the stone and you will likely continue to have pain during this time.  We recommend Tylenol 1 g every 8 hours and/or ibuprofen 400 mg every 6 hours.  For breakthrough pain not relieved by these other medications, we have also prescribed you oxycodone to take as needed.  Please be careful when taking this medication as it can cause you to be drowsy.  It also may make you feel nauseous so we have prescribed you Zofran to take as well.  [facility] doctors will call you to schedule a follow-up appointment to check on your symptoms.  If it anytime you develop fever or severe worsening of your pain, please come back to the emergency department.    [name], a senhora foi atendida hoje no pronto-socorro por dores no lado esquerdo. Parece que voc tem uma pedra nos rins de 5 mm que causa os sintomas. Sua urina no est infectada. Espera-se que uma pedra nos rins desse tamanho passe por si mesma e geralmente no requer cirurgia ou outras operaes. Pode levar vrios dias para que a pedra desaparea e voc provavelmente continuar a sentir dor durante esse perodo. Recomendamos Tylenol 1 g a cada 8 horas e / ou ibuprofeno 400 mg a cada 6 horas. Para dor irruptiva no aliviada por esses outros medicamentos, tambm prescrevemos oxicodona para voc tomar conforme necessrio. Tenha cuidado ao tomar [employer], pois pode causar sonolncia. Tambm pode causar nuseas, por isso prescrevemos Zofran para voc tomar tambm. Nossos urologistas Doctor, general practice para voc para agendar uma consulta de acompanhamento para verificar seus sintomas. Se a qualquer momento voc desenvolver febre ou piora severa de sua dor,  por favor, volte ao servio de emergncia.

## 2019-11-03 NOTE — Narrator Note (Signed)
Report given to [facility].

## 2019-11-03 NOTE — Narrator Note (Signed)
Patient Disposition  Patient education for diagnosis, medications, activity, diet and follow-up.  Patient left ED 9:41 AM.  Patient rep received written instructions.    Interpreter to provide instructions: No , pt declined     Patient belongings with patient: YES    Have all existing LDAs been addressed? Yes    Have all IV infusions been stopped? Yes    Destination: Discharged to home

## 2019-11-03 NOTE — ED Provider Notes (Signed)
The patient was seen primarily by me. ED nursing record was reviewed.    History, physical exam, and disposition planning were NOT conducted with an official hospital [place] interpreter.  Patient proficient in English           HPI:    Deborah [name] is a 54 year old female patient with history of nephrolithiasis who presents with left flank pain.  Patient reports she awoke this morning at 540 to urinate, urinated normally and return to bed when she experienced sudden onset left flank pain rating to the groin.  Since that time, the pain has been persistent with intermittent exacerbation in intensity.  Reports associated nausea/vomiting.  Denies any associated diarrhea/constipation, black/bloody stool, vaginal discharge, vaginal bleeding or urinary symptoms.  Prior to the onset of pain, patient was feeling well and in her normal state of health.  She has not taken anything for the pain.  Reports similar pain years ago with a kidney stone.    ROS: Pertinent positives were reviewed as per the HPI above. All other systems were reviewed and are negative.  Garen Grams  Language of care: [place] [name]  MRN: [id number]  PCP: Helane Gunther, APRN  Mode of arrival to ED: Self.  Chief complaint: Flank Pain    Past Medical History/Problem list:  Past Medical History:  No date: Esophageal reflux  No date: Heart disease  No date: Hyperopia  05/18/2017: Hyperopia of both eyes with astigmatism and presbyopia  No date: Irregular menstrual cycle  05/18/2017: Macular chorioretinal scar of left eye      Comment:  Temporal to fovea, left eye  No date: Pregnant state, incidental      Comment:  c/s breech  3/10: SVT (supraventricular tachycardia) (HCC)      Comment:  TCH  No date: Wears eyeglasses  Patient Active Problem List:     Lump or mass in breast     Gastroesophageal reflux disease without esophagitis     Other specified gastritis     Abdominal pain, epigastric     Angioneurotic edema not elsewhere classified      Plantar Fasciitis, L     Leg pain     Triggering of Finger, R 3rd     Right subacromial bursitis/rotator cuff tendinopathy     Chondromalacia patellae     Lower back pain     De Quervain's Tenosynovitis, R     CTS (Carpal Tunnel Syndrome), b/l     Family history of diabetes mellitus (DM)     Hypolipoproteinemia     Intermittent spinal claudication (HCC)     Vitamin D deficiency     Greater Trochanteric Bursitis, b/l     DDD (degenerative disc disease), lumbar     Obesity     Rapid Noctural Palpitations x 10 Seconds 2013.     Sweating     Microscopic hematuria     Reflux esophagitis     Lump of skin of lower extremity     Bilateral chronic knee pain     Bilateral arm pain     Positive occult stool blood test     Chronic right-sided low back pain with right-sided sciatica     SVT To 240 BPM March 2010. As Of 2017, Episodes q 2 Weeks, Usually Subside One Minute, Occ 15 Minutes.  Commonly Relieved With Vagal Maneuvers.     BMI 33.0-33.9,adult     Rapid Palpitations Only Once/Month, Lasting Seconds, Abolished by Valsalva, Aug 2018.  Polyarthralgia     Macular chorioretinal scar of left eye     Hyperopia of both eyes with astigmatism and presbyopia     Osteoarthritis of both knees     Lateral epicondylitis of right elbow     Trigger middle finger of right hand     Primary osteoarthritis of first carpometacarpal joint of left hand     Right shoulder injury     Chronic right shoulder pain     Covid Infection March 2021 -- Ill But Not Hosp'd.    Past Surgical History: Past Surgical History:  No date: CESAREAN DELIVERY ONLY      Comment:  breech  No date: [place]in BREAST SPECIMEN      Comment:  s/p fibroadenoma on the L,   No date: SEPTOPLASTY/SUBMUCOUS RESECJ W/WO CARTILAGE GRF  Social History:   Social History     Socioeconomic History    Marital status: Divorced     Spouse name: Not on file    Number of children: 1    Years of education: Not on file    Highest education level: Not on file   Occupational History     Occupation: housecleaning     Employer: [employer]   Tobacco Use    Smoking status: Former Smoker     Packs/day: 0.50     Years: 27.00     Pack years: 13.50     Types: Cigarettes     Quit date: 06/28/2002     Years since quitting: 17.3    Smokeless tobacco: Never Used   Substance and Sexual Activity    Alcohol use: No     Alcohol/week: 0.0 standard drinks    Drug use: No    Sexual activity: Not Currently     Partners: Male     Comment: no STI hx; HIV neg 2002   Other Topics Concern    Military Service Not Asked    Blood Transfusions Not Asked    Caffeine Concern Not Asked    Occupational Exposure Not Asked    Hobby Hazards Not Asked    Sleep Concern Not Asked    Stress Concern Yes    Weight Concern Yes    Special Diet Yes    Back Care Not Asked    Exercise No    Bike Helmet Not Asked    Seat Belt Yes    Self-Exams Yes   Social History Narrative    [place], [place]. To [place] 2000.    Lives with her son and her parents. Divorced from husband.    Denies DV. No guns in home.        No regular exercise. Dental care in place.   Social Determinants of Health  Financial Resource Strain:     Difficulty of Paying Living Expenses:   Food Insecurity:     Worried About Charity fundraiser in the Last Year:     Arboriculturist in the Last Year:   Transportation Needs:     Film/video editor (Medical):     Lack of Transportation (Non-Medical):   Physical Activity:     Days of Exercise per Week:     Minutes of Exercise per Session:   Stress:     Feeling of Stress :   Social Connections:     Frequency of Communication with Friends and Family:     Frequency of Social Gatherings with Friends and Family:     Attends Religious Services:  Active Member of Clubs or Organizations:     Attends Archivist Meetings:     Marital Status:   Intimate Partner Violence:     Fear of Current or Ex-Partner:     Emotionally Abused:     Physically Abused:     Sexually Abused:       Allergies: Review of Patient's Allergies indicates:   Naproxen                Swelling    Comment:Ed visit with angioedema   Ibuprofen               Other (See Comments)    Comment:Epigastric pain, throat swelling?             05/06/12 pt states uses sometimes without issue   Pollen extract-tree*    Runny Nose    Comment:HA, itchy watery eyes    Immunizations:   Immunization History   Administered Date(s) Administered    H1N1 0.22ml Intranasal 03/26/2008    INFLUENZA VIRUS TRI W/PRESV VACCINE 18/> YRS IM (PRIVATE) 02/18/2004, 02/13/2005, 03/26/2008, 02/27/2012    Influenza Virus Quad Presv Free Vacc 6 Mo and Older, IM 06/30/2014, 01/31/2017, 04/22/2018, 02/10/2019    Influenza Virus Quad W/Presv Vacc 6 Mo and Older, IM 03/08/2016    Td 02/27/2012    Tdap 03/08/2016          Medications:  Prior to Admission Medications   Prescriptions Last Dose Informant Patient Reported? Taking?   CALCIUM + VITAMIN D3 600-5 MG-MCG TABS Tablet   No No   Sig: TAKE 1 TABLET BY MOUTH TWO TIMES DAILY   Cholecalciferol (VITAMIN D3) 50 MCG (2000 UT) TABS   No No   Sig: TAKE 1 TABLET BY MOUTH DAILY   acetaminophen (TYLENOL) 500 MG tablet   Yes No   Sig: Take 500 mg by mouth every 6 (six) hours as needed for Pain   diclofenac (VOLTAREN) 1 % GEL Gel   No No   Sig: APPLY TO BOTH HANDS, BOTH ELBOWS AND BOTH KNEES UP TO 4 TIMES A DAY. DO NOT EXCEED 32 GRAMS IN 24 HOURS   metoprolol (TOPROL-XL) 100 MG 24 hr tablet   No No   Sig: Take 1 tablet by mouth daily   omeprazole (PRILOSEC) 20 MG capsule   No No   Sig: Take 1 capsule by mouth 2 (two) times daily before meals      Facility-Administered Medications: None     Physical Exam (ED Bed 07/07-A):   Patient Vitals for the past 999 hrs:   BP Temp Pulse Resp SpO2   11/03/19 0734 143/91 -- 69 20 --   11/03/19 0655 164/94 97.9 F 70 18 96 %     GENERAL:  Alert, appears in distress secondary to pain but otherwise well appearing, non-toxic   SKIN:  Warm & Dry  HEAD:  Normocephalic,  atraumatic  LUNGS:  Easy, non-labored breathing. Speaking in full sentences without difficulty. Lungs are clear to auscultation bilaterally. No wheezes, rales, rhonchi.   HEART:  RRR.  No murmurs.   ABDOMEN:  Soft, Non-tender, non-distended.  No masses.  No involuntary guarding or rebound. No CVA tenderness  EXTREMITIES:  No obvious deformities. No edema.   NEUROLOGIC:  Alert; moves all extremities; speaking in clear fluent sentences. Normal gait without ataxia.   PSYCHIATRIC:  Appropriate for age, time of day, and situation    Medications Given in the ED:    Medications   ondansetron (  ZOFRAN) injection 4 mg (4 mg Intravenous Given 11/03/19 0733)   morphine injection 6 mg (6 mg Intravenous Given 11/03/19 0734)   morphine injection 4 mg (4 mg Intravenous Given 11/03/19 0840)   acetaminophen (TYLENOL) tablet 1,000 mg (1,000 mg Oral Given 11/03/19 8315)   sodium chloride 0.9 % IV bolus 1,000 mL (0 mLs Intravenous Stopped 11/03/19 0858)   famotidine (PEPCID) injection 20 mg (20 mg Intravenous Given 11/03/19 0827)   ibuprofen (ADVIL) tablet 400 mg (400 mg Oral Given 11/03/19 1761)    Radiology Results:     Lab Results:     Labs Reviewed   CBC, PLATELET & DIFFERENTIAL - Abnormal; Notable for the following components:       Result Value    ABSOLUTE IMM GRAN COUNT 0.04 (*)     All other components within normal limits   COMPREHENSIVE METABOLIC PANEL - Abnormal; Notable for the following components:    BUN (UREA NITROGEN) 20 (*)     All other components within normal limits   URINALYSIS RFLX TO URINE CULT - Abnormal; Notable for the following components:    OCCULT BLOOD, URINE LARGE (*)     MICROSCOPIC SEE RESULTS (*)     RED BLOOD CELLS URINE MANY 10-50 (*)     BACTERIA 10-50 (*)     SQUAMOUS EPITHELIAL CELLS >10 (*)     All other components within normal limits    Narrative:     UCV&Urine, Clean Void   POC URINALYSIS - Abnormal; Notable for the following components:    OCCULT BLOOD, URINE MODERATE (*)     PROTEIN, URINE 30 (*)      All other components within normal limits   MAGNESIUM   URINE CULTURE WILL NOT BE DONE   URINE PREGNANCY TEST (POINT OF CARE)        CT Abdomen/Pelvis Stone Protocol  CLINICAL INDICATION: Flank pain, kidney stone suspected  stone    COMPARISON: June 18, 2012    TECHNIQUE: Noncontrast CT of the abdomen and pelvis in prone position with multiplanar reformats. The study is read within the limitations of a non contrast CT.  Radiation Dose: Radiation dose reduction techniques were employed. CTDIvol: 11.2 mGy. DLP: 543 mGy-cm.    FINDINGS:   Quality:  This a noncontrast study which provides less comprehensive assessment of the solid organs and vasculature.      Lower thorax: Unremarkable.    Liver: Hepatic steatosis    Gallbladder: No radiodense stones. No pericholecystic fluid.     Biliary: No biliary ductal dilatation.     Pancreas: Unremarkable.    Spleen: Unremarkable.    Adrenals: Unremarkable.    Kidneys and ureters: Mild/moderate left hydronephrosis secondary to a 5 mm stone at the ureteropelvic junction. No right hydronephrosis. No nephrolithiasis.     Stomach: Unremarkable.    Bowel: Unremarkable.     Appendix: Unremarkable.    Peritoneal cavity: No ascites or fluid collection.  No free air.     Bladder: Unremarkable.    Reproductive organs: Unremarkable.     Vessels: No aortic aneurysm.    Lymph nodes: No lymphadenopathy.    Abdominal wall superficial soft tissues: Small fat-containing umbilical hernia. A calcified the subcutaneous granulomas in bilateral gluteal regions.    Bones: Mild degenerative changes of the spine.    IMPRESSION:     Left hydronephrosis secondary to a 5 mm UPJ stone.          Reviewed and Electronically Signed by: Quita Skye  Gustin   Signed Date/Time: 11-03-2019 07:57:38                  ED Course and Medical Decision-making:  Clarke Amburn is a 54 year old female patient with history of nephrolithiasis who presents with left flank pain.  Patient is most consistent with nephrolithiasis.  CT  abdomen/pelvis stone protocol does show 5 mm stone in left UPJ.  Associated hydronephrosis.  Urine shows no evidence of infection and labs otherwise within normal limits.  Patient did appear very uncomfortable on initial presentation, given morphine.  She subsequently developed lightheadedness and nausea.  Given Zofran and IV fluids however IV infiltrated prior to completion of IV fluids.  Suspect symptoms are likely due to morphine rather than dehydration.  She was also given Tylenol for continued pain.  On reassessment, patient reports continued pain.  She disclosed that she often takes ibuprofen now without any difficulties, and documented allergy of naproxen/ibuprofen swelling was in the distant past.  Patient given 400 mg ibuprofen for additional pain relief.  Patient states she is still in some pain, but asking to be discharged at this time.  She was prescribed oxycodone and Zofran to take for breakthrough pain not relieved by Tylenol and Motrin.  Urology referral placed.  Discharged in stable condition with strict return precautions for any development of fever/pyelonephritis or worsening pain.         Patient/family educated on diagnosis(es); she states understanding and agreement with plan of care.  Reasons to return to the ED were reviewed in detail. She agrees with this plan and disposition.    Disposition: Discharge    Condition on Discharge: Improved and Stable      Diagnosis/Diagnoses:  Nephrolithiasis    Discharge Prescriptions:      Medication List      START taking these medications    ondansetron 4 MG disintegrating tablet  Commonly known as: ZOFRAN-ODT  Take 1 tablet by mouth every 8 (eight) hours as needed for Nausea  for up to 3 days     oxyCODONE 5 MG immediate release tablet  Commonly known as: ROXICODONE  Take 1-2 tablets by mouth every 4 (four) hours as needed for Pain (severe pain not relieved by other medications) Max Daily Amount: 60 mg  for up to 3 days        ASK your doctor about these  medications    acetaminophen 500 MG tablet  Commonly known as: TYLENOL     Calcium + Vitamin D3 600-200 MG-UNIT Tabs Tablet  Generic drug: Calcium Carb-Cholecalciferol  TAKE 1 TABLET BY MOUTH TWO TIMES DAILY     diclofenac 1 % Gel Gel  Commonly known as: VOLTAREN  APPLY TO BOTH HANDS, BOTH ELBOWS AND BOTH KNEES UP TO 4 TIMES A DAY. DO NOT EXCEED 32 GRAMS IN 24 HOURS     metoprolol 100 MG 24 hr tablet  Commonly known as: TOPROL-XL  Take 1 tablet by mouth daily     omeprazole 20 MG capsule  Commonly known as: PriLOSEC  Take 1 capsule by mouth 2 (two) times daily before meals     Vitamin D3 50 MCG (2000 UT) Tabs  TAKE 1 TABLET BY MOUTH DAILY           Where to Get Your Medications      These medications were sent to [facility], [place].  [address] [place] [id number]    Hours: Mon/Wed/Fri 8:00am -  5:00pm, Tues/Thurs 8:00am -8:00pm Phone: [phone]    ondansetron 4 MG disintegrating tablet   oxyCODONE 5 MG immediate release tablet           Cori Razor, MD, MPH  Attending Physician  Emergency [place]  This Emergency Department patient encounter note was created using voice-recognition software and in real time during the ED visit.

## 2019-11-03 NOTE — Narrator Note (Signed)
Assumed pt care  Pt reports 10/10 flank pain with nausea and vomiting.  Plan is to made MD aware.

## 2019-11-04 ENCOUNTER — Other Ambulatory Visit: Payer: Self-pay

## 2019-11-04 ENCOUNTER — Emergency Department
Admission: EM | Admit: 2019-11-04 | Discharge: 2019-11-04 | Disposition: A | Discharge status: Home / Self Care | Payer: No Typology Code available for payment source | Source: Intra-hospital | Attending: Emergency Medicine | Admitting: Emergency Medicine

## 2019-11-04 ENCOUNTER — Emergency Department
Admission: EM | Admit: 2019-11-04 | Discharge: 2019-11-04 | Disposition: A | Discharge status: Home / Self Care | Payer: No Typology Code available for payment source | Source: Intra-hospital | Attending: Student in an Organized Health Care Education/Training Program | Admitting: Student in an Organized Health Care Education/Training Program

## 2019-11-04 ENCOUNTER — Encounter (HOSPITAL_BASED_OUTPATIENT_CLINIC_OR_DEPARTMENT_OTHER): Payer: Self-pay

## 2019-11-04 DIAGNOSIS — N23 Unspecified renal colic: Secondary | ICD-10-CM | POA: Insufficient documentation

## 2019-11-04 DIAGNOSIS — Z20822 Contact with and (suspected) exposure to covid-19: Secondary | ICD-10-CM | POA: Diagnosis not present

## 2019-11-04 DIAGNOSIS — N200 Calculus of kidney: Secondary | ICD-10-CM | POA: Insufficient documentation

## 2019-11-04 DIAGNOSIS — R109 Unspecified abdominal pain: Secondary | ICD-10-CM | POA: Diagnosis present

## 2019-11-04 DIAGNOSIS — R1110 Vomiting, unspecified: Secondary | ICD-10-CM | POA: Diagnosis not present

## 2019-11-04 LAB — URINALYSIS RFLX TO URINE CULT
BACTERIA: NONE SEEN PER HPF (ref 0–5)
BILIRUBIN, URINE: NEGATIVE
BILIRUBIN, URINE: NEGATIVE
CASTS: NONE SEEN PER LPF
CASTS: NONE SEEN PER LPF
CRYSTALS: NONE SEEN
CRYSTALS: NONE SEEN
GLUCOSE, URINE: NEGATIVE MG/DL
GLUCOSE, URINE: NEGATIVE MG/DL
KETONE, URINE: NEGATIVE MG/DL
KETONE, URINE: 15 MG/DL — AB
LEUKOCYTE ESTERASE: NEGATIVE
LEUKOCYTE ESTERASE: NEGATIVE
NITRITE, URINE: NEGATIVE
NITRITE, URINE: NEGATIVE
PH URINE: 6 (ref 5.0–8.0)
PH URINE: 6 (ref 5.0–8.0)
PROTEIN, URINE: NEGATIVE MG/DL
PROTEIN, URINE: NEGATIVE MG/DL
SPECIFIC GRAVITY URINE: 1.01 (ref 1.003–1.035)
SPECIFIC GRAVITY URINE: 1.025 (ref 1.003–1.035)
SQUAMOUS EPITHELIAL CELLS: >10 PER LPF — AB (ref 0–4)

## 2019-11-04 LAB — BASIC METABOLIC PANEL
ANION GAP: 13 mmol/L (ref 5–15)
ANION GAP: 11 mmol/L (ref 5–15)
BUN (UREA NITROGEN): 21 mg/dL — ABNORMAL HIGH (ref 7–18)
BUN (UREA NITROGEN): 23 mg/dL — ABNORMAL HIGH (ref 7–18)
CALCIUM: 9 mg/dL (ref 8.5–10.1)
CALCIUM: 8.9 mg/dL (ref 8.5–10.1)
CARBON DIOXIDE: 25 mmol/L (ref 21–32)
CARBON DIOXIDE: 26 mmol/L (ref 21–32)
CHLORIDE: 101 mmol/L (ref 98–107)
CHLORIDE: 105 mmol/L (ref 98–107)
CREATININE: 1.3 mg/dL — ABNORMAL HIGH (ref 0.4–1.2)
CREATININE: 1.3 mg/dL — ABNORMAL HIGH (ref 0.4–1.2)
ESTIMATED GLOMERULAR FILT RATE: 47 mL/min — ABNORMAL LOW (ref >60)
ESTIMATED GLOMERULAR FILT RATE: 47 mL/min — ABNORMAL LOW (ref >60)
Glucose Random: 126 mg/dL (ref 74–160)
Glucose Random: 109 mg/dL (ref 74–160)
POTASSIUM: 4.4 mmol/L (ref 3.5–5.1)
POTASSIUM: 4 mmol/L (ref 3.5–5.1)
SODIUM: 139 mmol/L (ref 136–145)
SODIUM: 142 mmol/L (ref 136–145)

## 2019-11-04 LAB — CBC, PLATELET & DIFFERENTIAL
ABSOLUTE BASO COUNT: 0.1 10*3/uL (ref 0.0–0.1)
ABSOLUTE BASO COUNT: 0 10*3/uL (ref 0.0–0.1)
ABSOLUTE EOSINOPHIL COUNT: 0 10*3/uL (ref 0.0–0.8)
ABSOLUTE EOSINOPHIL COUNT: 0 10*3/uL (ref 0.0–0.8)
ABSOLUTE IMM GRAN COUNT: 0.09 10*3/uL — ABNORMAL HIGH (ref 0.00–0.03)
ABSOLUTE IMM GRAN COUNT: 0.06 10*3/uL — ABNORMAL HIGH (ref 0.00–0.03)
ABSOLUTE LYMPH COUNT: 2 10*3/uL (ref 0.6–5.9)
ABSOLUTE LYMPH COUNT: 1.8 10*3/uL (ref 0.6–5.9)
ABSOLUTE MONO COUNT: 1.1 10*3/uL (ref 0.2–1.4)
ABSOLUTE MONO COUNT: 1 10*3/uL (ref 0.2–1.4)
ABSOLUTE NEUTROPHIL COUNT: 12.5 10*3/uL — ABNORMAL HIGH (ref 1.6–8.3)
ABSOLUTE NEUTROPHIL COUNT: 8.8 10*3/uL — ABNORMAL HIGH (ref 1.6–8.3)
ABSOLUTE NRBC COUNT: 0 10*3/uL (ref 0.0–0.0)
ABSOLUTE NRBC COUNT: 0 10*3/uL (ref 0.0–0.0)
BASOPHIL %: 0.3 % (ref 0.0–1.2)
BASOPHIL %: 0.3 % (ref 0.0–1.2)
EOSINOPHIL %: 0.1 % (ref 0.0–7.0)
EOSINOPHIL %: 0.2 % (ref 0.0–7.0)
HEMATOCRIT: 37.6 % (ref 34.1–44.9)
HEMATOCRIT: 34.9 % (ref 34.1–44.9)
HEMOGLOBIN: 13 g/dL (ref 11.2–15.7)
HEMOGLOBIN: 11.8 g/dL (ref 11.2–15.7)
IMMATURE GRANULOCYTE %: 0.6 % — ABNORMAL HIGH (ref 0.0–0.4)
IMMATURE GRANULOCYTE %: 0.5 % — ABNORMAL HIGH (ref 0.0–0.4)
LYMPHOCYTE %: 12.5 % — ABNORMAL LOW (ref 15.0–54.0)
LYMPHOCYTE %: 15.7 % (ref 15.0–54.0)
MEAN CORP HGB CONC: 34.6 g/dL (ref 31.0–37.0)
MEAN CORP HGB CONC: 33.8 g/dL (ref 31.0–37.0)
MEAN CORPUSCULAR HGB: 29.5 pg (ref 26.0–34.0)
MEAN CORPUSCULAR HGB: 29.3 pg (ref 26.0–34.0)
MEAN CORPUSCULAR VOL: 85.3 fl (ref 80.0–100.0)
MEAN CORPUSCULAR VOL: 86.6 fl (ref 80.0–100.0)
MEAN PLATELET VOLUME: 9.4 fL (ref 8.7–12.5)
MEAN PLATELET VOLUME: 9.7 fL (ref 8.7–12.5)
MONOCYTE %: 7.2 % (ref 4.0–13.0)
MONOCYTE %: 8.4 % (ref 4.0–13.0)
NEUTROPHIL %: 79.3 % — ABNORMAL HIGH (ref 40.0–75.0)
NEUTROPHIL %: 74.9 % (ref 40.0–75.0)
NRBC %: 0 % (ref 0.0–0.0)
NRBC %: 0 % (ref 0.0–0.0)
PLATELET COUNT: 214 10*3/uL (ref 150–400)
PLATELET COUNT: 196 10*3/uL (ref 150–400)
RBC DISTRIBUTION WIDTH STD DEV: 37.4 fL (ref 35.1–46.3)
RBC DISTRIBUTION WIDTH STD DEV: 38.8 fL (ref 35.1–46.3)
RED BLOOD CELL COUNT: 4.41 M/uL (ref 3.90–5.20)
RED BLOOD CELL COUNT: 4.03 M/uL (ref 3.90–5.20)
WHITE BLOOD CELL COUNT: 15.8 10*3/uL — ABNORMAL HIGH (ref 4.0–11.0)
WHITE BLOOD CELL COUNT: 11.7 10*3/uL — ABNORMAL HIGH (ref 4.0–11.0)

## 2019-11-04 LAB — MAGNESIUM: MAGNESIUM: 1.8 mg/dL (ref 1.8–2.4)

## 2019-11-04 LAB — URINE CULTURE WILL NOT BE DONE

## 2019-11-04 LAB — COVID-19 INPATIENT: COVID-19 INPATIENT: NEGATIVE

## 2019-11-04 MED ORDER — SODIUM CHLORIDE 0.9 % IV BOLUS
1000.0000 mL | Freq: Once | INTRAVENOUS | Status: AC
Start: 2019-11-04 — End: 2019-11-04
  Administered 2019-11-04: 1000 mL via INTRAVENOUS

## 2019-11-04 MED ORDER — NAPROXEN 500 MG PO TABS
500.0000 mg | ORAL_TABLET | Freq: Two times a day (BID) | ORAL | 0 refills | Status: DC | PRN
Start: 2019-11-04 — End: 2019-11-13

## 2019-11-04 MED ORDER — KETOROLAC TROMETHAMINE 15 MG/ML IJ SOLN
15.0000 mg | Freq: Once | INTRAMUSCULAR | Status: AC
Start: 2019-11-04 — End: 2019-11-04
  Administered 2019-11-04: 15 mg via INTRAVENOUS
  Filled 2019-11-04: qty 1

## 2019-11-04 MED ORDER — ONDANSETRON HCL 4 MG/2ML IJ SOLN
4.0000 mg | Freq: Once | INTRAMUSCULAR | Status: AC
Start: 2019-11-04 — End: 2019-11-04
  Administered 2019-11-04: 4 mg via INTRAVENOUS
  Filled 2019-11-04: qty 2

## 2019-11-04 MED ORDER — TAMSULOSIN HCL 0.4 MG PO CAPS
0.4000 mg | ORAL_CAPSULE | Freq: Every day | ORAL | 0 refills | Status: DC
Start: 2019-11-04 — End: 2019-11-13

## 2019-11-04 NOTE — Narrator Note (Signed)
Patient Disposition  Patient education for diagnosis, medications, activity, diet and follow-up.  Patient left ED 6:24 AM.  Patient rep received written instructions.    Interpreter to provide instructions: No    Patient belongings with patient: YES    Have all existing LDAs been addressed? Yes    Have all IV infusions been stopped? Yes    Destination: Discharged to home

## 2019-11-04 NOTE — ED Triage Note (Signed)
Pt presents to ED after being seen here yesterday approx 630am for Kidney stones. Pt reports taking oxycodone at home with no relief. Pt reports pain in Left flank/abd  at  This time +nausea

## 2019-11-04 NOTE — Narrator Note (Signed)
Pt relocated to room 8

## 2019-11-04 NOTE — ED Provider Notes (Signed)
EMERGENCY DEPARTMENT PHYSICIAN ASSISTANT NOTE      The ED nursing record was reviewed.   The prior medical records as available electronically through Epic were reviewed.    This patient was seen with Emergency Department attending physician Dr. Mary Sella    CHIEF COMPLAINT    Patient presents with:  Kidney Stones      HPI    Deborah [name] is a 54 year old female, history of GERD, who comes emergency room complaining of worsening left flank pain and abdominal pain, now with nausea and vomiting.  Patient states that when she woke up this morning she had significant pain in her left flank.  Patient came to the emergency room, was told that she had a kidney stone.  Patient received medication, felt better and was discharged home.  Patient states that after taking oxycodone as prescribed, her stomach got more upset, causing her to have nausea and vomiting.  Patient states that she has been vomiting over the last several hours, and having increasing left flank pain.  Patient states that it got worse so she returned to the emergency room.  Patient denies any fever, no focalized abdominal pain, "more like an upset stomach", denies any diarrhea.  Patient states that she did urinate twice since discharge, was using the strainer with no obvious stone.    REVIEW OF SYSTEMS    General: no fever  Respiratory: no dyspnea  Cardiac: no chest pain  GI: nausea, vomiting  GU: no hematuria   Musculoskeletal: flank pain  Skin: no rash  Psych: no acute complaints  Neuro: no headache    PAST MEDICAL HISTORY    Past Medical History:  No date: Esophageal reflux  No date: Heart disease  No date: Hyperopia  05/18/2017: Hyperopia of both eyes with astigmatism and presbyopia  No date: Irregular menstrual cycle  05/18/2017: Macular chorioretinal scar of left eye      Comment:  Temporal to fovea, left eye  No date: Pregnant state, incidental      Comment:  c/s breech  3/10: SVT (supraventricular tachycardia) (HCC)      Comment:  TCH  No date:  Wears eyeglasses    PROBLEM LIST  Patient Active Problem List:     Lump or mass in breast     Gastroesophageal reflux disease without esophagitis     Other specified gastritis     Abdominal pain, epigastric     Angioneurotic edema not elsewhere classified     Plantar Fasciitis, L     Leg pain     Triggering of Finger, R 3rd     Right subacromial bursitis/rotator cuff tendinopathy     Chondromalacia patellae     Lower back pain     De Quervain's Tenosynovitis, R     CTS (Carpal Tunnel Syndrome), b/l     Family history of diabetes mellitus (DM)     Hypolipoproteinemia     Intermittent spinal claudication (HCC)     Vitamin D deficiency     Greater Trochanteric Bursitis, b/l     DDD (degenerative disc disease), lumbar     Obesity     Rapid Noctural Palpitations x 10 Seconds 2013.     Sweating     Microscopic hematuria     Reflux esophagitis     Lump of skin of lower extremity     Bilateral chronic knee pain     Bilateral arm pain     Positive occult stool blood test  Chronic right-sided low back pain with right-sided sciatica     SVT To 240 BPM March 2010. As Of 2017, Episodes q 2 Weeks, Usually Subside One Minute, Occ 15 Minutes.  Commonly Relieved With Vagal Maneuvers.     BMI 33.0-33.9,adult     Rapid Palpitations Only Once/Month, Lasting Seconds, Abolished by Valsalva, Aug 2018.      Polyarthralgia     Macular chorioretinal scar of left eye     Hyperopia of both eyes with astigmatism and presbyopia     Osteoarthritis of both knees     Lateral epicondylitis of right elbow     Trigger middle finger of right hand     Primary osteoarthritis of first carpometacarpal joint of left hand     Right shoulder injury     Chronic right shoulder pain     Covid Infection March 2021 -- Ill But Not Hosp'd.      SURGICAL HISTORY    Past Surgical History:  No date: CESAREAN DELIVERY ONLY      Comment:  breech  No date: [place] BREAST SPECIMEN      Comment:  s/p fibroadenoma on the L,   No date: SEPTOPLASTY/SUBMUCOUS RESECJ W/WO  CARTILAGE GRF    CURRENT MEDICATIONS      Current Facility-Administered Medications:     ondansetron (ZOFRAN) injection 4 mg, 4 mg, Intravenous, Once, Daking Westervelt A Altonio Schwertner, PA-C    sodium chloride 0.9 % IV bolus 1,000 mL, 1,000 mL, Intravenous, Once, Clint Guy, PA-C    Current Outpatient Medications:     oxyCODONE (ROXICODONE) 5 MG immediate release tablet, Take 1-2 tablets by mouth every 4 (four) hours as needed for Pain (severe pain not relieved by other medications) Max Daily Amount: 60 mg  for up to 3 days, Disp: 12 tablet, Rfl: 0    ondansetron (ZOFRAN-ODT) 4 MG disintegrating tablet, Take 1 tablet by mouth every 8 (eight) hours as needed for Nausea  for up to 3 days, Disp: 9 tablet, Rfl: 0    diclofenac (VOLTAREN) 1 % GEL Gel, APPLY TO BOTH HANDS, BOTH ELBOWS AND BOTH KNEES UP TO 4 TIMES A DAY. DO NOT EXCEED 32 GRAMS IN 24 HOURS, Disp: 100 g, Rfl: 1    CALCIUM + VITAMIN D3 600-5 MG-MCG TABS Tablet, TAKE 1 TABLET BY MOUTH TWO TIMES DAILY, Disp: 60 tablet, Rfl: 10    Cholecalciferol (VITAMIN D3) 50 MCG (2000 UT) TABS, TAKE 1 TABLET BY MOUTH DAILY, Disp: 30 tablet, Rfl: 11    metoprolol (TOPROL-XL) 100 MG 24 hr tablet, Take 1 tablet by mouth daily, Disp: 90 tablet, Rfl: 3    omeprazole (PRILOSEC) 20 MG capsule, Take 1 capsule by mouth 2 (two) times daily before meals, Disp: 60 capsule, Rfl: 11    acetaminophen (TYLENOL) 500 MG tablet, Take 500 mg by mouth every 6 (six) hours as needed for Pain, Disp: , Rfl:     ALLERGIES    Review of Patient's Allergies indicates:   Naproxen                Swelling    Comment:Ed visit with angioedema   Ibuprofen               Other (See Comments)    Comment:Epigastric pain, throat swelling?             05/06/12 pt states uses sometimes without issue   Pollen extract-tree*    Runny Nose    Comment:HA, itchy watery eyes  FAMILY HISTORY    Review of patient's family history indicates:  Problem: Heart      Relation: Father          Age of Onset: (Not Specified)           Comment: arrhythmia  Problem: Hypertension      Relation: Mother          Age of Onset: (Not Specified)  Problem: Hypertension      Relation: Father          Age of Onset: (Not Specified)  Problem: Diabetes      Relation: Mother          Age of Onset: (Not Specified)  Problem: Diabetes      Relation: Maternal Uncle          Age of Onset: (Not Specified)  Problem: Diabetes      Relation: Maternal Uncle          Age of Onset: (Not Specified)  Problem: Glaucoma      Relation: Maternal Uncle          Age of Onset: (Not Specified)  Problem: Heart      Relation: Maternal Grandmother          Age of Onset: (Not Specified)  Problem: Heart      Relation: Maternal Uncle          Age of Onset: (Not Specified)  Problem: Heart      Relation: Maternal Uncle          Age of Onset: (Not Specified)  Problem: Cancer - Other      Relation: FamHxNeg          Age of Onset: (Not Specified)  Problem: Cancer - Breast      Relation: FamHxNeg          Age of Onset: (Not Specified)  Problem: Cancer - Colon      Relation: FamHxNeg          Age of Onset: (Not Specified)  Problem: Cancer - Lung      Relation: FamHxNeg          Age of Onset: (Not Specified)  Problem: Cancer - Ovarian      Relation: FamHxNeg          Age of Onset: (Not Specified)      SOCIAL HISTORY    Social History     Socioeconomic History    Marital status: Divorced     Spouse name: Not on file    Number of children: 1    Years of education: Not on file    Highest education level: Not on file   Occupational History    Occupation: housecleaning     Employer: [employer]   Tobacco Use    Smoking status: Former Smoker     Packs/day: 0.50     Years: 27.00     Pack years: 13.50     Types: Cigarettes     Quit date: 06/28/2002     Years since quitting: 17.3    Smokeless tobacco: Never Used   Substance and Sexual Activity    Alcohol use: No     Alcohol/week: 0.0 standard drinks    Drug use: No    Sexual activity: Not Currently     Partners: Male     Comment: no STI hx; HIV neg  2002   Other Topics Concern    Military Service Not Asked    Blood Transfusions Not  Asked    Caffeine Concern Not Asked    Occupational Exposure Not Asked    [place] Hazards Not Asked    Sleep Concern Not Asked    Stress Concern Yes    Weight Concern Yes    Special Diet Yes    Back Care Not Asked    Exercise No    Bike Helmet Not Asked    Seat Belt Yes    Self-Exams Yes   Social History Narrative    [place], [place]. To [place] 2000.    Lives with her son and her parents. Divorced from husband.    Denies DV. No guns in home.        No regular exercise. Dental care in place.   Social Determinants of Health  Financial Resource Strain:     Difficulty of Paying Living Expenses:   Food Insecurity:     Worried About Charity fundraiser in the Last Year:     Arboriculturist in the Last Year:   Transportation Needs:     Film/video editor (Medical):     Lack of Transportation (Non-Medical):   Physical Activity:     Days of Exercise per Week:     Minutes of Exercise per Session:   Stress:     Feeling of Stress :   Social Connections:     Frequency of Communication with Friends and Family:     Frequency of Social Gatherings with Friends and Family:     Attends Religious Services:     Active Member of Clubs or Organizations:     Attends Music therapist:     Marital Status:   Intimate Partner Violence:     Fear of Current or Ex-Partner:     Emotionally Abused:     Physically Abused:     Sexually Abused:       PHYSICAL EXAM      Vital Signs: BP 165/94    Pulse 93    Temp 98.5 F    Resp 20    Wt 83.9 kg (185 lb)    LMP 10/17/2006    SpO2 96%    BMI 34.96 kg/m      Constitutional: Nontoxic but ill and uncomfortable appearing, actively retching, unable to sit or get comfortable   Head: Normocephalic   Eyes: Vision grossly intact.  ENT: Mucous membranes moist.  Hearing grossly intact.    Neck: Normal range of motion  Cardio.: RRR, No MRG's.   Pulmonary:Normal BS's  bilaterally, non-labored, No tachypnea, wheezes, rales or rhonchi  Abd.  Soft, NTND, No masses, rebound or guarding. No Murphy's, McBurney's, or Rovsing's.   GU:  Left CVA tenderness.  Musculoskeletal:  Moving all 4 extremities, No major deformities noted. Ambulatory with steady gait.  Neurological: CNII-XII grossly intact, No focal deficits noted.   Psychiatric: Appropriate for age and situation.  Skin: Warm, dry      RESULTS  No results found for this visit on 11/04/19 (from the past 24 hour(s)).   RADIOLOGY  CT abd/pelvis today: Left hydronephrosis secondary to a 5 mm [facility] stone.     MEDICATIONS ADMINISTERED ON THIS VISIT  Orders Placed This Encounter      ondansetron (ZOFRAN) injection 4 mg      sodium chloride 0.9 % IV bolus 1,000 mL      ED COURSE & MEDICAL DECISION MAKING      I reviewed the patient's past medical history/problem list, past surgical history, medication  list, social history and allergies.    Case discussed with patient, in chart documented naproxen and ibuprofen allergy.  Patient states that she does not take them because it upsets her stomach, but denies any rash, tongue or lip swelling or difficulty breathing.  Patient did receive Motrin earlier today with no adverse reaction.  Agreeable to Toradol.  Chart reviewed, patient did receive morphine earlier, and had lightheadedness, will avoid at this time.    0200 hours: Patient reevaluated, reports feeling significantly improved after Zofran and Toradol.  No evidence of rash, or edema.  Will continue to give IV fluids, awaiting urine sample.  Case discussed with surgery team, recommend close wound observation after increase IV fluid.  If patient requires admission for pain control, they will accept to their service.    ED Decision Making & Course: Patient is a 54 year old female, history of GERD, who comes emergency room complaining of worsening nausea vomiting and left flank pain.  Patient was seen in the emergency department earlier today,  diagnosed with 5 mm kidney stone at [facility].  Patient improved while in the department, ultimately discharged home on oxycodone.  Patient states that after taking oxycodone she had worsening nausea, vomiting, and subsequent developed worsening left flank pain.  On arrival, patient is actively retching, and extremely uncomfortable appearing, however abdomen soft no focalized tenderness.  Patient treated with IV fluid, Zofran Toradol with symptomatic improvement.  Labs obtained, pending at the end of shift.  Case signed out to overnight ED attending, Dr. Mary Sella.  Patient remained hemodynamically stable during their stay in the emergency department.     Diagnosis: kidney stone, flank pain, vomiting  Condition: stable  Disposition: still in department      This Emergency Department patient encounter note was created using voice-recognition software and in real time during the ED visit, please excuse any dictation errors.

## 2019-11-04 NOTE — Discharge Instructions (Signed)
Evaluated today for pain related to kidney stones.     Read all provided instructions. Take all medications as prescribed. Follow up with urology. Seek medical attention if you develop new or worsening symptoms or if you have any other concerns.

## 2019-11-04 NOTE — ED Triage Note (Signed)
Pt seen here last noc, dx w/kidney stones, returns with L flank pain 10/10, last tylenol at 1500 and zofran odt 1900 today

## 2019-11-04 NOTE — Narrator Note (Signed)
Patient Disposition  Patient education for diagnosis, medications, activity, diet and follow-up.  Patient left ED 11:00 PM.  Patient rep received written instructions.    Interpreter to provide instructions: Yes; Interpreter ID: [email or web address] interpreter    Patient belongings with patient: YES    Have all existing LDAs been addressed? Yes    Have all IV infusions been stopped? Yes    Destination: Discharged to home    Pt d/c'd by Clifton Custard.

## 2019-11-04 NOTE — Narrator Note (Signed)
Urine obtained and sent.

## 2019-11-04 NOTE — Discharge Instructions (Signed)
You were treated for kidney stone pain with Toradol  I have given you prescription for naproxen which you can take as needed for pain

## 2019-11-10 ENCOUNTER — Ambulatory Visit
Payer: No Typology Code available for payment source | Attending: Clinical Cardiac Electrophysiology | Admitting: Clinical Cardiac Electrophysiology

## 2019-11-10 DIAGNOSIS — I471 Supraventricular tachycardia, unspecified: Secondary | ICD-10-CM

## 2019-11-10 NOTE — Progress Notes (Signed)
OUTPATIENT ELECTROPHYSIOLOGY CONSULT NOTE  Date of visit: 11/10/2019    Patient is referred by Brandy Hale, MD, for evaluation and management of recurrent SVT.    In summary patient is 54 year old female with the following medical problems:   SVT To 240 bpm (diagnosed March 2010); on Toprol XL; recurrent symptoms relieved w/ vagal maneuvers  (primary encounter diagnosis)      HISTORY OF PRESENT ILLNESS:  54 year old woman who was diagnosed with very rapid SVT art 240 bpm in March 2010.    She recalls that at initial presentation, she had been cleaning a house. She had just finished and went to the bathroom the urinate. When she stoop up from the toilet, she had sudden onset of very rapid palpitations. She presented to the ED where she was found to have SVT at 240 bpm. She has been treated with Toprol XL and has been on 100 mg qPM for at least the past 2 years.    Symptoms with SVT consist with fluttering and strong sudden onset heart racing with sudden offset - sometimes, palpitations wake her up from sleep. She may also have them during work or while driving. She had a bad week in April 2021. Generally, her palpitations last <5 minutes and terminate with Valsava but she feels tired afterwards for several hours with some associated nausea. Presyncope is sometimes associated with palpitations. No prior syncope. Last severe cluster of episodes occurred in early April 2021 although she admits to symptoms 2-3 times per week with more prolonged episodes once every 6 weeks or so. Still, even her prolonged episodes last <5 minutes; she has not needed to present to the ED since her initial presentation in March 2010.    Interestingly, she reports having sudden onset of feeling as if she has "no energy;" these episodes last at least 20 minutes but they are not clearly associated with palpitations. She has these spells once per week.    She has been suffering with a L kidney stone v- 3 ER visits recently; being  followed by Dr. Callie Fielding; has had hematuria.    Current Medications:  diclofenac (VOLTAREN) 1 % GEL Gel, APPLY TO BOTH HANDS, BOTH ELBOWS AND BOTH KNEES UP TO 4 TIMES A DAY. DO NOT EXCEED 32 GRAMS IN 24 HOURS, Disp: 100 g, Rfl: 1  CALCIUM + VITAMIN D3 600-5 MG-MCG TABS Tablet, TAKE 1 TABLET BY MOUTH TWO TIMES DAILY, Disp: 60 tablet, Rfl: 10  Cholecalciferol (VITAMIN D3) 50 MCG (2000 UT) TABS, TAKE 1 TABLET BY MOUTH DAILY, Disp: 30 tablet, Rfl: 11  metoprolol (TOPROL-XL) 100 MG 24 hr tablet, Take 1 tablet by mouth daily, Disp: 90 tablet, Rfl: 3  omeprazole (PRILOSEC) 20 MG capsule, Take 1 capsule by mouth 2 (two) times daily before meals, Disp: 60 capsule, Rfl: 11    No current facility-administered medications on file prior to visit.      Review of Patient's Allergies indicates:   Naproxen                Swelling    Comment:Ed visit with angioedema   Ibuprofen               Other (See Comments)    Comment:Epigastric pain, throat swelling?             05/06/12 pt states uses sometimes without issue   Morphine                Other (See Comments)  Comment:"THE LAST TIME I WAS HERE, THEY GAVE IT TO ME AND             TOLD ME TO TELL THE DOCTOR NOT TO GIVE IT TO ME             ANYMORE. IT MADE ME ACT DIFFERENT".   Pollen extract-tree*    Runny Nose    Comment:HA, itchy watery eyes    FAMILY HISTORY: Father has AFib. Mother is diabetes and has HTN.    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 Lives with son (20 yrs), father and mother. No cigs, EtOH, or drugs.     ROS: All other systems were pertinently reviewed and were negative unless otherwise outlined above.    PHYSICAL EXAM not performed today due to Televisit format to the encounter.    PERTINENT INVESTIGATIONS:  1. EKG 07/11/2019 (on my personal review): Sinus rhythm at 73 bpm. .142/.84/.434. QRS axis 53. Normal tracing.  2. LABS:   Lab Results   Component Value Date    NA 143 11/12/2019    K 4.3 11/12/2019    CL 108 (H)  11/12/2019    CO2 25 11/12/2019    BUN 21 (H) 11/12/2019    CREAT 1.3 (H) 11/12/2019    GLUCOSER 95 11/12/2019     Lab Results   Component Value Date    LDL 121 02/17/2019    HDL 44 02/17/2019    TG 197 (H) 02/17/2019     ECHO 02/22/2016:  1. LV ejection fraction is 60%.    2. There are no regional wall motion abnormalities.    3. Normal study.    4. Compared to the previous study dated 04/18/11, there is no significant change.    ASSESSMENT:  54 year old woman with structurally normal heart on echo and normal baseline ECG who presented with her first episode of rapid SVT in March 2010. She is treated with Toprol XL and has frequent recurrent but short lived symptoms although followed by several hours of fatigue. She also reports intermittent spells of "low energy" separate from palpitations.    I recommended a 30-day event monitor to determine her SVT burden and to evaluate "low energy" spells. I also explained to her the options for treatment of her SVT including continuation/up-titration of medication versus ablation of SVT. The ablation procedure, along with its risks and benefits, was explained to the patient in detail. Patient demonstrated good understanding. All questions were answered. She is considering ablation but we decided to pursue 30-day event monitor first to ascertain the etiology of all symptoms as above.    PLAN:  (I47.1) SVT To 240 bpm (diagnosed March 2010); on Toprol XL; recurrent symptoms relieved w/ vagal maneuvers  (primary encounter diagnosis)  Comment: Suspect AVNRT although I cannot retrieve her SVT ECG from March 2010.  Plan:   1. Continue Toprol XL 100 mg qPM.  2. 30-Day event monitor to ascertain her SVT burden and determine the etiology of her fatigue spells.  3. I also asked her to keep a BP log during these spells of sudden fatigue lasting 20 minutes and occurring once per week.  4. Pending results of the event monitor, I would consider SVT ablation since she is still symptomatic  despite Toprol XL.      Return for follow up in Cardiology Clinic in 2 months. To contact earlier if there are any cardiac related issues.  I  spent a total of 45 minutes on this visit on the date of service (total time includes all activities performed on the date of service).    This Cardiology Division patient encounter note was created using voice-recognition software and in real time during the clinic visit. Please excuse any typographical errors that have not been edited out.     Electronically signed by: [name], MD, 11/16/2019 11:31 PM

## 2019-11-10 NOTE — ED Provider Notes (Signed)
[facility] Emergency Medicine Attending Note      The patient was seen primarily by me. ED nursing record was reviewed. Select prior records as available electronically through the Epic record were reviewed.    History of Present Illness:    Deborah [name] is a 54 year old female pw renal colic. 3rd time seen for this. No dysuria or fever.   Stomach upset w/ oxycodone.                 Interpreter used? [place] Vallory Oetken   MRN: [id number]  PCP: Helane Gunther, APRN    Arrived to the ED by: Relative    History provided by: Patient     Review of Systems:  As per HPI. All other systems were reviewed and are negative        Past Medical Hx:  Past Medical History:  No date: Esophageal reflux  No date: Heart disease  No date: Hyperopia  05/18/2017: Hyperopia of both eyes with astigmatism and presbyopia  No date: Irregular menstrual cycle  05/18/2017: Macular chorioretinal scar of left eye      Comment:  Temporal to fovea, left eye  No date: Pregnant state, incidental      Comment:  c/s breech  3/10: SVT (supraventricular tachycardia) (HCC)      Comment:  TCH  No date: Wears eyeglasses Meds:  No current facility-administered medications for this encounter.     Current Outpatient Medications   Medication Sig    tamsulosin (FLOMAX) 0.4 MG CAPS Capsule Take 1 capsule by mouth daily  for 7 days    naproxen (NAPROSYN) 500 MG tablet Take 1 tablet by mouth every 12 (twelve) hours as needed for Pain as needed for pain. Take with food  for up to 10 days    diclofenac (VOLTAREN) 1 % GEL Gel APPLY TO BOTH HANDS, BOTH ELBOWS AND BOTH KNEES UP TO 4 TIMES A DAY. DO NOT EXCEED 32 GRAMS IN 24 HOURS    CALCIUM + VITAMIN D3 600-5 MG-MCG TABS Tablet TAKE 1 TABLET BY MOUTH TWO TIMES DAILY    Cholecalciferol (VITAMIN D3) 50 MCG (2000 UT) TABS TAKE 1 TABLET BY MOUTH DAILY    metoprolol (TOPROL-XL) 100 MG 24 hr tablet Take 1 tablet by mouth daily    omeprazole (PRILOSEC) 20 MG capsule Take 1 capsule by mouth 2 (two) times daily  before meals    acetaminophen (TYLENOL) 500 MG tablet Take 500 mg by mouth every 6 (six) hours as needed for Pain         Past Surgical Hx:  Past Surgical History:  No date: CESAREAN DELIVERY ONLY      Comment:  breech  No date: [place] BREAST SPECIMEN      Comment:  s/p fibroadenoma on the L,   No date: SEPTOPLASTY/SUBMUCOUS RESECJ W/WO CARTILAGE GRF Allergies:  Review of Patient's Allergies indicates:   Naproxen                Swelling    Comment:Ed visit with angioedema   Ibuprofen               Other (See Comments)    Comment:Epigastric pain, throat swelling?             05/06/12 pt states uses sometimes without issue   Pollen extract-tree*    Runny Nose    Comment:HA, itchy watery eyes   Social Hx:  Social History    Tobacco Use  Smoking status: Former Smoker        Packs/day: 0.50        Years: 27.00        Pack years: 13.5        Types: Cigarettes        Quit date: 06/28/2002        Years since quitting: 17.3      Smokeless tobacco: Never Used    Alcohol use: No      Alcohol/week: 0.0 standard drinks   Immunizations:  Immunization History   Administered Date(s) Administered    H1N1 0.80ml Intranasal 03/26/2008    INFLUENZA VIRUS TRI W/PRESV VACCINE 18/> YRS IM (PRIVATE) 02/18/2004, 02/13/2005, 03/26/2008, 02/27/2012    Influenza Virus Quad Presv Free Vacc 6 Mo and Older, IM 06/30/2014, 01/31/2017, 04/22/2018, 02/10/2019    Influenza Virus Quad W/Presv Vacc 6 Mo and Older, IM 03/08/2016    Td 02/27/2012    Tdap 03/08/2016        Physical Examination:    ED Triage Vitals [11/04/19 2005]   ED Triage Vitals Brief Group      Temp 98.1 F      Pulse 78      Resp 20      BP 175/81      SpO2 98 %      Pain Score 10         General: Mod distress 2/2 pain  Head: NCAT  Eyes: Anicteric, EOMI  ENT: Oropharynx clear, MMM  Neck: Supple,   CV: RRR  Lungs: Unlabored, CTAB  Abdomen: Nondistended soft nontender, LCVAT  Extremities: Warm, well perfused, pulse intact  Neuro: Alert, follows commands  Psych: Normal mood,  cooperative    Medications Given in the ED:    Medications   ketorolac (TORADOL) injection 15 mg (15 mg Intravenous Given 11/04/19 2012)    Radiology and ECG:    No orders to display        Lab Results:    Labs Reviewed   CBC, PLATELET & DIFFERENTIAL - Abnormal; Notable for the following components:       Result Value    WHITE BLOOD CELL COUNT 11.7 (*)     IMMATURE GRANULOCYTE % 0.5 (*)     ABSOLUTE NEUTROPHIL COUNT 8.8 (*)     ABSOLUTE IMM GRAN COUNT 0.06 (*)     All other components within normal limits   BASIC METABOLIC PANEL - Abnormal; Notable for the following components:    BUN (UREA NITROGEN) 23 (*)     CREATININE 1.3 (*)     ESTIMATED GLOMERULAR FILT RATE 47 (*)     All other components within normal limits   URINALYSIS RFLX TO URINE CULT - Abnormal; Notable for the following components:    KETONE, URINE 15 (*)     OCCULT BLOOD, URINE LARGE (*)     MICROSCOPIC SEE RESULTS (*)     RED BLOOD CELLS URINE MANY 10-50 (*)     SQUAMOUS EPITHELIAL CELLS >10 (*)     All other components within normal limits    Narrative:     UCV&Urine, Clean Void   URINE CULTURE WILL NOT BE DONE    Vital Signs:     11/04/19  2005   BP: 175/81   Pulse: 78   Resp: 20   Temp: 98.1 F   SpO2: 98%   Weight: 83.9 kg (185 lb)          ED Course and Medical Decision Making:  79 year oldfemale presenting with recurrent renal colic. No s/o infection. UA w/o UTI.  Given IV toradol with resolution of symptoms. Dc home w/ urology f/u           Patient/family educated on their diagnosis, she verbalizes understanding and agrees with plan of care. She was told to follow up with her primary care physician. I reviewed with her reasons to return to the Emergency Department, all questions were answered.    ED Disposition:     Impression(s):  Renal colic    Disposition:  Discharged home    ED Prescriptions:      Medication List      START taking these medications    naproxen 500 MG tablet  Commonly known as: NAPROSYN  Take 1 tablet by mouth every 12  (twelve) hours as needed for Pain as needed for pain. Take with food  for up to 10 days        ASK your doctor about these medications    acetaminophen 500 MG tablet  Commonly known as: TYLENOL     Calcium + Vitamin D3 600-200 MG-UNIT Tabs Tablet  Generic drug: Calcium Carb-Cholecalciferol  TAKE 1 TABLET BY MOUTH TWO TIMES DAILY     diclofenac 1 % Gel Gel  Commonly known as: VOLTAREN  APPLY TO BOTH HANDS, BOTH ELBOWS AND BOTH KNEES UP TO 4 TIMES A DAY. DO NOT EXCEED 32 GRAMS IN 24 HOURS     metoprolol 100 MG 24 hr tablet  Commonly known as: TOPROL-XL  Take 1 tablet by mouth daily     omeprazole 20 MG capsule  Commonly known as: PriLOSEC  Take 1 capsule by mouth 2 (two) times daily before meals     ondansetron 4 MG disintegrating tablet  Commonly known as: ZOFRAN-ODT  Take 1 tablet by mouth every 8 (eight) hours as needed for Nausea  for up to 3 days  Ask about: Should I take this medication?     oxyCODONE 5 MG immediate release tablet  Commonly known as: ROXICODONE  Take 1-2 tablets by mouth every 4 (four) hours as needed for Pain (severe pain not relieved by other medications) Max Daily Amount: 60 mg  for up to 3 days  Ask about: Should I take this medication?     tamsulosin 0.4 MG Caps Capsule  Commonly known as: FLOMAX  Take 1 capsule by mouth daily  for 7 days     Vitamin D3 50 MCG (2000 UT) Tabs  TAKE 1 TABLET BY MOUTH DAILY           Where to Get Your Medications      You can get these medications from any pharmacy    Bring a paper prescription for each of these medications   naproxen 500 MG tablet               Louie Bun, MD  Attending Physician, Emergency [place]    This Emergency Department patient encounter note was created using voice-recognition software. Please excuse any typographical errors that have not yet been reviewed and corrected.

## 2019-11-11 ENCOUNTER — Other Ambulatory Visit: Payer: Self-pay

## 2019-11-11 ENCOUNTER — Encounter (HOSPITAL_BASED_OUTPATIENT_CLINIC_OR_DEPARTMENT_OTHER): Payer: Self-pay | Admitting: OAS-Outpatient Addiction Svcs(PRV Practice 40)

## 2019-11-11 ENCOUNTER — Inpatient Hospital Stay
Admission: EM | Admit: 2019-11-11 | Discharge: 2019-11-13 | DRG: 446 | Disposition: A | Discharge status: Home / Self Care | Payer: No Typology Code available for payment source | Source: Intra-hospital | Attending: Urology | Admitting: Urology

## 2019-11-11 DIAGNOSIS — N132 Hydronephrosis with renal and ureteral calculous obstruction: Principal | ICD-10-CM | POA: Diagnosis present

## 2019-11-11 DIAGNOSIS — Z20822 Contact with and (suspected) exposure to covid-19: Secondary | ICD-10-CM | POA: Diagnosis present

## 2019-11-11 DIAGNOSIS — Z87442 Personal history of urinary calculi: Secondary | ICD-10-CM

## 2019-11-11 DIAGNOSIS — Z1152 Encounter for screening for COVID-19: Secondary | ICD-10-CM | POA: Diagnosis not present

## 2019-11-11 DIAGNOSIS — N201 Calculus of ureter: Principal | ICD-10-CM

## 2019-11-11 DIAGNOSIS — N200 Calculus of kidney: Secondary | ICD-10-CM

## 2019-11-11 DIAGNOSIS — Z87891 Personal history of nicotine dependence: Secondary | ICD-10-CM

## 2019-11-11 MED ORDER — ONDANSETRON HCL 4 MG/2ML IJ SOLN
4.00 mg | Freq: Once | INTRAMUSCULAR | Status: AC
Start: 2019-11-11 — End: 2019-11-12
  Administered 2019-11-12: 4 mg via INTRAVENOUS
  Filled 2019-11-11: qty 2

## 2019-11-11 MED ORDER — SODIUM CHLORIDE 0.9 % IV BOLUS
1000.0000 mL | Freq: Once | INTRAVENOUS | Status: AC
  Administered 2019-11-11: 1000 mL via INTRAVENOUS

## 2019-11-11 MED ORDER — FENTANYL CITRATE 0.05 MG/ML IJ SOLN
50.00 ug | Freq: Once | INTRAMUSCULAR | Status: AC
Start: 2019-11-11 — End: 2019-11-11
  Administered 2019-11-11: 50 ug via INTRAVENOUS
  Filled 2019-11-11: qty 2

## 2019-11-11 NOTE — ED Provider Notes (Signed)
The patient was seen primarily by me. ED nursing record was reviewed. Select prior records as available electronically through the Epic record were reviewed.    ED RN triage:   Triage Documentation       Juleen Starr, RN 11/11/2019 22:46                 Pt arrives with continual kidney stone pain from last week. Pt was diagnosed stones. Today pain is worse    HPI:    Deborah [name] is a 54 year old female patient who has a past medical history of Esophageal reflux, Heart disease, Hyperopia, Hyperopia of both eyes with astigmatism and presbyopia (05/18/2017), Irregular menstrual cycle, Macular chorioretinal scar of left eye (05/18/2017), Pregnant state, incidental, SVT (supraventricular tachycardia) ([place]) (3/10), and Wears eyeglasses. She also has no past medical history of Full dentures or Malignant hyperthermia. The pt presents to the ED complaining of persistent left-sided flank pain.  She does report some associated nausea.  She has been taking naproxen without relief.  She denies any fever, chills, sweats.    ROS: Pertinent positives were reviewed as per the HPI above. All other systems were reviewed and are negative.  Garen Grams  Language of care: [place] [name]  MRN: [id number]  PCP: Helane Gunther, APRN  Mode of arrival to ED: Relative.  Chief complaint: Kidney Stones    Past Medical History/Problem list:  Past Medical History:  No date: Esophageal reflux  No date: Heart disease  No date: Hyperopia  05/18/2017: Hyperopia of both eyes with astigmatism and presbyopia  No date: Irregular menstrual cycle  05/18/2017: Macular chorioretinal scar of left eye      Comment:  Temporal to fovea, left eye  No date: Pregnant state, incidental      Comment:  c/s breech  3/10: SVT (supraventricular tachycardia) (HCC)      Comment:  TCH  No date: Wears eyeglasses  Patient Active Problem List:     Lump or mass in breast     Gastroesophageal reflux disease without esophagitis     Other specified gastritis     Abdominal pain,  epigastric     Angioneurotic edema not elsewhere classified     Plantar Fasciitis, L     Leg pain     Triggering of Finger, R 3rd     Right subacromial bursitis/rotator cuff tendinopathy     Chondromalacia patellae     Lower back pain     De Quervain's Tenosynovitis, R     CTS (Carpal Tunnel Syndrome), b/l     Family history of diabetes mellitus (DM)     Hypolipoproteinemia     Intermittent spinal claudication (HCC)     Vitamin D deficiency     Greater Trochanteric Bursitis, b/l     DDD (degenerative disc disease), lumbar     Obesity     Rapid Noctural Palpitations x 10 Seconds 2013.     Sweating     Microscopic hematuria     Reflux esophagitis     Lump of skin of lower extremity     Bilateral chronic knee pain     Bilateral arm pain     Positive occult stool blood test     Chronic right-sided low back pain with right-sided sciatica     SVT To 240 BPM March 2010. As Of 2017, Episodes q 2 Weeks, Usually Subside One Minute, Occ 15 Minutes.  Commonly Relieved With Vagal Maneuvers.     BMI 33.0-33.9,adult  Rapid Palpitations Only Once/Month, Lasting Seconds, Abolished by Valsalva, Aug 2018.      Polyarthralgia     Macular chorioretinal scar of left eye     Hyperopia of both eyes with astigmatism and presbyopia     Osteoarthritis of both knees     Lateral epicondylitis of right elbow     Trigger middle finger of right hand     Primary osteoarthritis of first carpometacarpal joint of left hand     Right shoulder injury     Chronic right shoulder pain     Covid Infection March 2021 -- Ill But Not Hosp'd.     Left ureteral stone    Past Surgical History: Past Surgical History:  No date: CESAREAN DELIVERY ONLY      Comment:  breech  No date: [place] BREAST SPECIMEN      Comment:  s/p fibroadenoma on the L,   No date: SEPTOPLASTY/SUBMUCOUS RESECJ W/WO CARTILAGE GRF  Social History:   Social History     Socioeconomic History    Marital status: Divorced     Spouse name: Not on file    Number of children: 1    Years of education:  Not on file    Highest education level: Not on file   Occupational History    Occupation: housecleaning     Employer: [employer]   Tobacco Use    Smoking status: Former Smoker     Packs/day: 0.50     Years: 27.00     Pack years: 13.50     Types: Cigarettes     Quit date: 06/28/2002     Years since quitting: 17.3    Smokeless tobacco: Never Used   Substance and Sexual Activity    Alcohol use: No     Alcohol/week: 0.0 standard drinks    Drug use: No    Sexual activity: Not Currently     Partners: Male     Comment: no STI hx; HIV neg 2002   Other Topics Concern    Military Service Not Asked    Blood Transfusions Not Asked    Caffeine Concern Not Asked    Occupational Exposure Not Asked    Hobby Hazards Not Asked    Sleep Concern Not Asked    Stress Concern Yes    Weight Concern Yes    Special Diet Yes    Back Care Not Asked    Exercise No    Bike Helmet Not Asked    Seat Belt Yes    Self-Exams Yes   Social History Narrative    [place], [place]. To [place] 2000.    Lives with her son and her parents. Divorced from husband.    Denies DV. No guns in home.        No regular exercise. Dental care in place.   Social Determinants of Health  Financial Resource Strain:       Difficulty of Paying Living Expenses:   Food Insecurity:       Worried About Charity fundraiser in the Last Year:       Arboriculturist in the Last Year:   Transportation Needs:       Film/video editor (Medical):       Lack of Transportation (Non-Medical):   Physical Activity:       Days of Exercise per Week:       Minutes of Exercise per Session:   Stress:  Feeling of Stress :   Social Connections:       Frequency of Communication with Friends and Family:       Frequency of Social Gatherings with Friends and Family:       Attends Religious Services:       Active Member of Clubs or Organizations:       Attends Archivist Meetings:       Marital Status:   Intimate Partner Violence:       Fear of Current or Ex-Partner:       Emotionally Abused:        Physically Abused:       Sexually Abused:      Allergies: Review of Patient's Allergies indicates:   Naproxen                Swelling    Comment:Ed visit with angioedema   Ibuprofen               Other (See Comments)    Comment:Epigastric pain, throat swelling?             05/06/12 pt states uses sometimes without issue   Morphine                Other (See Comments)    Comment:"THE LAST TIME I WAS HERE, THEY GAVE IT TO ME AND             TOLD ME TO TELL THE DOCTOR NOT TO GIVE IT TO ME             ANYMORE. IT MADE ME ACT DIFFERENT".   Pollen extract-tree*    Runny Nose    Comment:HA, itchy watery eyes    Immunizations:   Immunization History   Administered Date(s) Administered    H1N1 0.24ml Intranasal 03/26/2008    INFLUENZA VIRUS TRI W/PRESV VACCINE 18/> YRS IM (PRIVATE) 02/18/2004, 02/13/2005, 03/26/2008, 02/27/2012    Influenza Virus Quad Presv Free Vacc 6 Mo and Older, IM 06/30/2014, 01/31/2017, 04/22/2018, 02/10/2019    Influenza Virus Quad W/Presv Vacc 6 Mo and Older, IM 03/08/2016    Td 02/27/2012    Tdap 03/08/2016          Medications:  Prior to Admission Medications   Prescriptions Last Dose Informant Patient Reported? Taking?   CALCIUM + VITAMIN D3 600-5 MG-MCG TABS Tablet   No No   Sig: TAKE 1 TABLET BY MOUTH TWO TIMES DAILY   Cholecalciferol (VITAMIN D3) 50 MCG (2000 UT) TABS   No No   Sig: TAKE 1 TABLET BY MOUTH DAILY   acetaminophen (TYLENOL) 500 MG tablet   Yes No   Sig: Take 500 mg by mouth every 6 (six) hours as needed for Pain   diclofenac (VOLTAREN) 1 % GEL Gel   No No   Sig: APPLY TO BOTH HANDS, BOTH ELBOWS AND BOTH KNEES UP TO 4 TIMES A DAY. DO NOT EXCEED 32 GRAMS IN 24 HOURS   metoprolol (TOPROL-XL) 100 MG 24 hr tablet   No No   Sig: Take 1 tablet by mouth daily   naproxen (NAPROSYN) 500 MG tablet   No No   Sig: Take 1 tablet by mouth every 12 (twelve) hours as needed for Pain as needed for pain. Take with food  for up to 10 days   omeprazole (PRILOSEC) 20 MG capsule   No No   Sig: Take 1  capsule by mouth 2 (two) times daily before meals   tamsulosin (FLOMAX)  0.4 MG CAPS Capsule   No No   Sig: Take 1 capsule by mouth daily  for 7 days      Facility-Administered Medications: None     Physical Exam (ED Bed [facility] 104/WH 104-A):   Patient Vitals for the past 999 hrs:   BP Temp Temp src Pulse Resp SpO2 Weight   11/12/19 0556 133/84 97.6 F -- 71 -- 99 % --   11/12/19 0555 -- -- -- -- -- -- 85.7 kg (188 lb 14.4 oz)   11/12/19 0406 -- -- -- 64 -- 97 % --   11/12/19 0230 149/84 -- -- 70 -- 98 % --   11/12/19 0039 132/83 -- -- 68 -- 99 % --   11/11/19 2359 166/92 -- -- 67 20 -- --   11/11/19 2302 (!) 168/118 -- -- 79 -- 98 % --   11/11/19 2242 -- -- -- -- -- -- 79.4 kg (175 lb)   11/11/19 2239 162/95 97.8 F Oral 89 20 97 % --     GENERAL:  WDWN, no acute distress, non-toxic   SKIN:  Warm & Dry, no rash, no petechiae or purpurae.  HEAD:  NCAT. Sclerae are anicteric and aninjected, oropharynx is clear with moist mucous membranes. PERRL. EOMI.  NECK:  Supple, no LAN.  LUNGS:  Clear to auscultation bilaterally. No wheezes, rales, rhonchi.   HEART:  RRR.  No murmurs, rubs, or gallops.   ABDOMEN:  Soft, NTND. + L CVA tenderness  EXTREMITIES:  No obvious deformities. No cyanosis. No edema.  NEUROLOGIC:  Alert; moves all extremities; speaking in clear fluent sentences. Normal gait without ataxia.  PSYCHIATRIC:  Appropriate for age, time of day, and situation    Medications Given in the ED:    Medications   metoprolol (TOPROL-XL) 24 hr tablet 100 mg (has no administration in time range)   tamsulosin (FLOMAX) capsule 0.4 mg (has no administration in time range)   heparin (porcine) injection 5,000 Units (5,000 Units Subcutaneous Given 11/12/19 0611)   sodium chloride 0.9% infusion ( Intravenous New Bag 11/12/19 0606)   ketorolac (TORADOL) injection 15 mg (has no administration in time range)   ondansetron (ZOFRAN) injection 4 mg (has no administration in time range)   sodium chloride 0.9 % IV bolus 1,000 mL (0 mLs Intravenous  Stopped 11/12/19 0209)   ondansetron (ZOFRAN) injection 4 mg (4 mg Intravenous Given 11/12/19 0002)   fentaNYL (SUBLIMAZE) injection 50 mcg (50 mcg Intravenous Given 11/11/19 2359)    Radiology Results:  CT Abdomen/Pelvis Stone Protocol    Result Date: 11/03/2019  CLINICAL INDICATION: Flank pain, kidney stone suspected stone COMPARISON: June 18, 2012 TECHNIQUE: Noncontrast CT of the abdomen and pelvis in prone position with multiplanar reformats. The study is read within the limitations of a non contrast CT. Radiation Dose: Radiation dose reduction techniques were employed. CTDIvol: 11.2 mGy. DLP: 543 mGy-cm. FINDINGS: Quality:  This a noncontrast study which provides less comprehensive assessment of the solid organs and vasculature.  Lower thorax: Unremarkable. Liver: Hepatic steatosis Gallbladder: No radiodense stones. No pericholecystic fluid. Biliary: No biliary ductal dilatation. Pancreas: Unremarkable. Spleen: Unremarkable. Adrenals: Unremarkable. Kidneys and ureters: Mild/moderate left hydronephrosis secondary to a 5 mm stone at the ureteropelvic junction. No right hydronephrosis. No nephrolithiasis. Stomach: Unremarkable. Bowel: Unremarkable. Appendix: Unremarkable. Peritoneal cavity: No ascites or fluid collection.  No free air. Bladder: Unremarkable. Reproductive organs: Unremarkable. Vessels: No aortic aneurysm. Lymph nodes: No lymphadenopathy. Abdominal wall superficial soft tissues: Small fat-containing umbilical hernia. A calcified  the subcutaneous granulomas in bilateral gluteal regions. Bones: Mild degenerative changes of the spine. IMPRESSION: Left hydronephrosis secondary to a 5 mm UPJ stone.   Reviewed and Electronically Signed by: Geanie Berlin Signed Date/Time: 11-03-2019 07:57:38          Lab Results:     Labs Reviewed   CBC, PLATELET & DIFFERENTIAL - Abnormal; Notable for the following components:       Result Value    WHITE BLOOD CELL COUNT 11.7 (*)     ABSOLUTE IMM GRAN COUNT 0.04 (*)     All other  components within normal limits   COMPREHENSIVE METABOLIC PANEL - Abnormal; Notable for the following components:    BUN (UREA NITROGEN) 26 (*)     ALKALINE PHOSPHATASE 119 (*)     CREATININE 1.6 (*)     ESTIMATED GLOMERULAR FILT RATE 36 (*)     All other components within normal limits   URINALYSIS RFLX TO URINE CULT - Abnormal; Notable for the following components:    OCCULT BLOOD, URINE SMALL (*)     MICROSCOPIC SEE RESULTS (*)     WHITE BLOOD CELLS URINE MODERATE 5-10 (*)     RED BLOOD CELLS URINE FEW 3-4 (*)     BACTERIA 6-9 (*)     SQUAMOUS EPITHELIAL CELLS >10 (*)     All other components within normal limits    Narrative:     UCV&Urine, Clean Void   MAGNESIUM   PHOSPHORUS   BASIC METABOLIC PANEL   MAGNESIUM   CBC WITH PLATELET   COVID-19 INPATIENT   URINE CULTURE WILL NOT BE DONE             ED Course and Medical Decision-making:    Patient has not passed her kidney stone.  Ultrasound shows no left ureteral jet and moderate hydronephrosis.  Discussed with Urology who will admit her for lithotripsy.    Patient/family educated on diagnosis(es); she states understanding and agreement with plan of care. Reasons to return to the ED were reviewed in detail. She agrees with this plan and disposition.    Disposition: Admit To [place]    Condition on Admission: Improved and Stable      Diagnosis/Diagnoses:  Kidney stone  Hydronephrosis with urinary obstruction due to renal calculus    Glee Arvin DO, Attending Physician, [facility]  This emergency department patient encounter note was created during a national COVID-19 pandemic using voice-recognition software and in real time while caring for patients in the ED.

## 2019-11-11 NOTE — ED Triage Note (Signed)
Pt arrives with continual kidney stone pain from last week. Pt was diagnosed stones. Today pain is worse

## 2019-11-12 ENCOUNTER — Inpatient Hospital Stay (HOSPITAL_BASED_OUTPATIENT_CLINIC_OR_DEPARTMENT_OTHER): Payer: No Typology Code available for payment source

## 2019-11-12 ENCOUNTER — Emergency Department (HOSPITAL_BASED_OUTPATIENT_CLINIC_OR_DEPARTMENT_OTHER): Payer: No Typology Code available for payment source

## 2019-11-12 ENCOUNTER — Encounter (HOSPITAL_BASED_OUTPATIENT_CLINIC_OR_DEPARTMENT_OTHER): Payer: Self-pay | Admitting: Urology

## 2019-11-12 ENCOUNTER — Encounter (HOSPITAL_BASED_OUTPATIENT_CLINIC_OR_DEPARTMENT_OTHER)
Admission: EM | Disposition: A | Discharge status: Home / Self Care | Payer: Self-pay | Source: Emergency Department | Attending: Urology

## 2019-11-12 DIAGNOSIS — N200 Calculus of kidney: Secondary | ICD-10-CM

## 2019-11-12 DIAGNOSIS — Z1152 Encounter for screening for COVID-19: Secondary | ICD-10-CM | POA: Diagnosis not present

## 2019-11-12 DIAGNOSIS — N201 Calculus of ureter: Principal | ICD-10-CM

## 2019-11-12 DIAGNOSIS — Z87442 Personal history of urinary calculi: Secondary | ICD-10-CM | POA: Diagnosis not present

## 2019-11-12 DIAGNOSIS — N132 Hydronephrosis with renal and ureteral calculous obstruction: Secondary | ICD-10-CM | POA: Diagnosis not present

## 2019-11-12 DIAGNOSIS — N1330 Unspecified hydronephrosis: Secondary | ICD-10-CM

## 2019-11-12 DIAGNOSIS — Z20822 Contact with and (suspected) exposure to covid-19: Secondary | ICD-10-CM | POA: Diagnosis present

## 2019-11-12 DIAGNOSIS — Z87891 Personal history of nicotine dependence: Secondary | ICD-10-CM | POA: Diagnosis not present

## 2019-11-12 HISTORY — DX: Calculus of ureter: N20.1

## 2019-11-12 LAB — URINALYSIS RFLX TO URINE CULT
BILIRUBIN, URINE: NEGATIVE
CASTS: NONE SEEN PER LPF
CRYSTALS: NONE SEEN
GLUCOSE, URINE: NEGATIVE MG/DL
KETONE, URINE: NEGATIVE MG/DL
LEUKOCYTE ESTERASE: NEGATIVE
NITRITE, URINE: NEGATIVE
PH URINE: 5.5 (ref 5.0–8.0)
PROTEIN, URINE: NEGATIVE MG/DL
SPECIFIC GRAVITY URINE: 1.02 (ref 1.003–1.035)
SQUAMOUS EPITHELIAL CELLS: >10 PER LPF — AB (ref 0–4)

## 2019-11-12 LAB — CBC, PLATELET & DIFFERENTIAL
ABSOLUTE BASO COUNT: 0 10*3/uL (ref 0.0–0.1)
ABSOLUTE EOSINOPHIL COUNT: 0.1 10*3/uL (ref 0.0–0.8)
ABSOLUTE IMM GRAN COUNT: 0.04 10*3/uL — ABNORMAL HIGH (ref 0.00–0.03)
ABSOLUTE LYMPH COUNT: 2.9 10*3/uL (ref 0.6–5.9)
ABSOLUTE MONO COUNT: 1 10*3/uL (ref 0.2–1.4)
ABSOLUTE NEUTROPHIL COUNT: 7.6 10*3/uL (ref 1.6–8.3)
ABSOLUTE NRBC COUNT: 0 10*3/uL (ref 0.0–0.0)
BASOPHIL %: 0.3 % (ref 0.0–1.2)
EOSINOPHIL %: 1.1 % (ref 0.0–7.0)
HEMATOCRIT: 35.8 % (ref 34.1–44.9)
HEMOGLOBIN: 11.9 g/dL (ref 11.2–15.7)
IMMATURE GRANULOCYTE %: 0.3 % (ref 0.0–0.4)
LYMPHOCYTE %: 24.6 % (ref 15.0–54.0)
MEAN CORP HGB CONC: 33.2 g/dL (ref 31.0–37.0)
MEAN CORPUSCULAR HGB: 29.2 pg (ref 26.0–34.0)
MEAN CORPUSCULAR VOL: 87.7 fl (ref 80.0–100.0)
MEAN PLATELET VOLUME: 9.4 fL (ref 8.7–12.5)
MONOCYTE %: 8.7 % (ref 4.0–13.0)
NEUTROPHIL %: 65 % (ref 40.0–75.0)
NRBC %: 0 % (ref 0.0–0.0)
PLATELET COUNT: 194 10*3/uL (ref 150–400)
RBC DISTRIBUTION WIDTH STD DEV: 38.1 fL (ref 35.1–46.3)
RED BLOOD CELL COUNT: 4.08 M/uL (ref 3.90–5.20)
WHITE BLOOD CELL COUNT: 11.7 10*3/uL — ABNORMAL HIGH (ref 4.0–11.0)

## 2019-11-12 LAB — COMPREHENSIVE METABOLIC PANEL
ALANINE AMINOTRANSFERASE: 37 U/L (ref 12–45)
ALBUMIN: 4 g/dL (ref 3.4–5.0)
ALKALINE PHOSPHATASE: 119 U/L — ABNORMAL HIGH (ref 45–117)
ANION GAP: 11 mmol/L (ref 5–15)
ASPARTATE AMINOTRANSFERASE: 20 U/L (ref 8–34)
BILIRUBIN TOTAL: 0.3 mg/dL (ref 0.2–1.0)
BUN (UREA NITROGEN): 26 mg/dL — ABNORMAL HIGH (ref 7–18)
CALCIUM: 9.1 mg/dL (ref 8.5–10.1)
CARBON DIOXIDE: 26 mmol/L (ref 21–32)
CHLORIDE: 106 mmol/L (ref 98–107)
CREATININE: 1.6 mg/dL — ABNORMAL HIGH (ref 0.4–1.2)
ESTIMATED GLOMERULAR FILT RATE: 36 mL/min — ABNORMAL LOW (ref >60)
Glucose Random: 110 mg/dL (ref 74–160)
POTASSIUM: 4.5 mmol/L (ref 3.5–5.1)
SODIUM: 143 mmol/L (ref 136–145)
TOTAL PROTEIN: 7.5 g/dL (ref 6.4–8.2)

## 2019-11-12 LAB — CBC WITH PLATELET
ABSOLUTE NRBC COUNT: 0 10*3/uL (ref 0.0–0.0)
HEMATOCRIT: 34.9 % (ref 34.1–44.9)
HEMOGLOBIN: 11.5 g/dL (ref 11.2–15.7)
MEAN CORP HGB CONC: 33 g/dL (ref 31.0–37.0)
MEAN CORPUSCULAR HGB: 29.1 pg (ref 26.0–34.0)
MEAN CORPUSCULAR VOL: 88.4 fl (ref 80.0–100.0)
MEAN PLATELET VOLUME: 9.6 fL (ref 8.7–12.5)
NRBC %: 0 % (ref 0.0–0.0)
PLATELET COUNT: 174 10*3/uL (ref 150–400)
RBC DISTRIBUTION WIDTH STD DEV: 37.9 fL (ref 35.1–46.3)
RED BLOOD CELL COUNT: 3.95 M/uL (ref 3.90–5.20)
WHITE BLOOD CELL COUNT: 7.2 10*3/uL (ref 4.0–11.0)

## 2019-11-12 LAB — BASIC METABOLIC PANEL
ANION GAP: 11 mmol/L (ref 5–15)
BUN (UREA NITROGEN): 21 mg/dL — ABNORMAL HIGH (ref 7–18)
CALCIUM: 8.7 mg/dL (ref 8.5–10.1)
CARBON DIOXIDE: 25 mmol/L (ref 21–32)
CHLORIDE: 108 mmol/L — ABNORMAL HIGH (ref 98–107)
CREATININE: 1.3 mg/dL — ABNORMAL HIGH (ref 0.4–1.2)
ESTIMATED GLOMERULAR FILT RATE: 47 mL/min — ABNORMAL LOW (ref >60)
Glucose Random: 95 mg/dL (ref 74–160)
POTASSIUM: 4.3 mmol/L (ref 3.5–5.1)
SODIUM: 143 mmol/L (ref 136–145)

## 2019-11-12 LAB — MAGNESIUM
MAGNESIUM: 2.1 mg/dL (ref 1.8–2.4)
MAGNESIUM: 2.1 mg/dL (ref 1.8–2.4)

## 2019-11-12 LAB — COVID-19 INPATIENT: COVID-19 INPATIENT: NEGATIVE

## 2019-11-12 LAB — PHOSPHORUS: PHOSPHORUS: 3 mg/dL (ref 2.9–5.3)

## 2019-11-12 LAB — URINE CULTURE WILL NOT BE DONE

## 2019-11-12 SURGERY — CYSTOSCOPY, WITH URETERAL STENT INSERTION
Anesthesia: General | Site: Ureter | Laterality: Left | Wound class: Class II/ Clean Contaminated

## 2019-11-12 MED ORDER — KETOROLAC TROMETHAMINE 15 MG/ML IJ SOLN
15.0000 mg | Freq: Four times a day (QID) | INTRAMUSCULAR | Status: DC | PRN
Start: 2019-11-12 — End: 2019-11-12

## 2019-11-12 MED ORDER — METOPROLOL SUCCINATE ER 100 MG PO TB24
100.0000 mg | ORAL_TABLET | Freq: Every day | ORAL | Status: DC
Start: 2019-11-12 — End: 2019-11-13
  Administered 2019-11-12: 100 mg via ORAL
  Filled 2019-11-12: qty 1

## 2019-11-12 MED ORDER — SODIUM CHLORIDE 0.9 % IV SOLN
INTRAVENOUS | Status: DC
Start: 2019-11-12 — End: 2019-11-13

## 2019-11-12 MED ORDER — PROPOFOL 200 MG/20 ML IV - AN
Freq: Once | INTRAVENOUS | Status: DC | PRN
Start: 2019-11-12 — End: 2019-11-12
  Administered 2019-11-12: 200 mg via INTRAVENOUS

## 2019-11-12 MED ORDER — FENTANYL CITRATE 0.05 MG/ML IJ SOLN
25.0000 ug | INTRAMUSCULAR | Status: DC | PRN
Start: 2019-11-12 — End: 2019-11-12

## 2019-11-12 MED ORDER — TAMSULOSIN HCL 0.4 MG PO CAPS
0.4000 mg | ORAL_CAPSULE | Freq: Every day | ORAL | Status: DC
Start: 2019-11-12 — End: 2019-11-13
  Administered 2019-11-12 – 2019-11-13 (×2): 0.4 mg via ORAL
  Filled 2019-11-12 (×2): qty 1

## 2019-11-12 MED ORDER — MEPERIDINE HCL 50 MG/ML IJ SOLN
12.5000 mg | INTRAMUSCULAR | Status: DC | PRN
Start: 2019-11-12 — End: 2019-11-12

## 2019-11-12 MED ORDER — ONDANSETRON HCL 4 MG/2ML IJ SOLN
INTRAMUSCULAR | Status: AC
Start: 2019-11-12 — End: 2019-11-12
  Filled 2019-11-12: qty 2

## 2019-11-12 MED ORDER — EPHEDRINE SULFATE 50 MG/ML IJ SOLN (SUPER ERX)
Freq: Once | INTRAMUSCULAR | Status: DC | PRN
Start: 2019-11-12 — End: 2019-11-12
  Administered 2019-11-12 (×2): 5 mg via INTRAVENOUS

## 2019-11-12 MED ORDER — ONDANSETRON HCL 4 MG/2ML IJ SOLN
4.0000 mg | Freq: Once | INTRAMUSCULAR | Status: DC | PRN
Start: 2019-11-12 — End: 2019-11-12
  Filled 2019-11-12: qty 2

## 2019-11-12 MED ORDER — ONDANSETRON HCL 4 MG/2ML IJ SOLN
Freq: Once | INTRAMUSCULAR | Status: DC | PRN
Start: 2019-11-12 — End: 2019-11-12
  Administered 2019-11-12 (×2): 4 mg via INTRAVENOUS

## 2019-11-12 MED ORDER — ACETAMINOPHEN 325 MG PO TABS
650.0000 mg | ORAL_TABLET | Freq: Four times a day (QID) | ORAL | Status: DC | PRN
Start: 2019-11-12 — End: 2019-11-13
  Administered 2019-11-12 (×2): 650 mg via ORAL
  Filled 2019-11-12 (×2): qty 2

## 2019-11-12 MED ORDER — LACTATED RINGERS IV SOLN
INTRAVENOUS | Status: DC
Start: 2019-11-12 — End: 2019-11-12

## 2019-11-12 MED ORDER — CEFAZOLIN IN SODIUM CHLORIDE 3-0.9 GM/100ML-% IV SOLN
3.00 g | Freq: Once | INTRAVENOUS | Status: AC
Start: 2019-11-12 — End: 2019-11-12
  Administered 2019-11-12: 3 g via INTRAVENOUS
  Filled 2019-11-12: qty 100

## 2019-11-12 MED ORDER — HEPARIN SODIUM (PORCINE) 5000 UNIT/ML IJ SOLN
5000.0000 [IU] | Freq: Three times a day (TID) | INTRAMUSCULAR | Status: DC
Start: 2019-11-12 — End: 2019-11-13
  Administered 2019-11-12 – 2019-11-13 (×3): 5000 [IU] via SUBCUTANEOUS
  Filled 2019-11-12 (×3): qty 1

## 2019-11-12 MED ORDER — MIDAZOLAM HCL 2 MG/2 ML IJ SOLN
INTRAMUSCULAR | Status: AC
Start: 2019-11-12 — End: 2019-11-12
  Filled 2019-11-12: qty 2

## 2019-11-12 MED ORDER — LIDOCAINE HCL (PF) 2 % IJ SOLN
Freq: Once | INTRAMUSCULAR | Status: DC | PRN
Start: 2019-11-12 — End: 2019-11-12
  Administered 2019-11-12: 2 mL via INTRAVENOUS

## 2019-11-12 MED ORDER — ONDANSETRON HCL 4 MG/2ML IJ SOLN
4.0000 mg | Freq: Four times a day (QID) | INTRAMUSCULAR | Status: DC | PRN
Start: 2019-11-12 — End: 2019-11-13

## 2019-11-12 MED ORDER — LACTATED RINGERS IV BOLUS
INTRAVENOUS | Status: DC | PRN
Start: 2019-11-12 — End: 2019-11-12

## 2019-11-12 MED ORDER — FENTANYL CITRATE 0.05 MG/ML IJ SOLN
INTRAMUSCULAR | Status: AC
Start: 2019-11-12 — End: 2019-11-12
  Filled 2019-11-12: qty 2

## 2019-11-12 MED ORDER — MIDAZOLAM HCL 2 MG/2 ML IJ SOLN
Freq: Once | INTRAMUSCULAR | Status: DC | PRN
Start: 2019-11-12 — End: 2019-11-12
  Administered 2019-11-12: 2 mg via INTRAVENOUS

## 2019-11-12 MED ORDER — EPHEDRINE SULFATE-NACL 25-0.9 MG/5ML-% IV SOSY
PREFILLED_SYRINGE | INTRAVENOUS | Status: AC
Start: 2019-11-12 — End: 2019-11-12
  Filled 2019-11-12: qty 5

## 2019-11-12 MED ORDER — FENTANYL CITRATE 0.05 MG/ML IJ SOLN
Freq: Once | INTRAMUSCULAR | Status: DC | PRN
Start: 2019-11-12 — End: 2019-11-12
  Administered 2019-11-12: 50 ug via INTRAVENOUS

## 2019-11-12 SURGICAL SUPPLY — 11 items
.9% NACL IRRIGATION 500ML (SOLUTION) ×2 IMPLANT
ANES BREATHING CIRCUIT (ANES) ×2 IMPLANT
CONTOUR URET. STENT 6FX22-30CM ×2 IMPLANT
NACL 3000 .9% 2B7127 (SOLUTION) ×2 IMPLANT
OMNIPAQUE CONTRAST 300 100ML (CONTRAST) ×2 IMPLANT
PACK CYSTO (PACK) ×2 IMPLANT
POLLACK URETERAL CATHETER 5FR (CATHURET) ×2 IMPLANT
QUANTA PRECISION FIBER 365UM (LITHO) ×2 IMPLANT
SZ 7 L/F ORTH SURG GLOVE (GLOVE) ×2 IMPLANT
UNIQUE SIZE 4 LMA 125040 (AIRWAY) ×2 IMPLANT
ZIPWIRE (UROLOGY) ×2 IMPLANT

## 2019-11-12 NOTE — Progress Notes (Signed)
Inpatient Progress Note    Patient: Deborah [name]  MRN: [id number]  Attending: Dr. Gwinda Passe  Date: 11/12/2019    Interval History and Subjective:  Still with some pain  Not feeling quite ready to go home tonight  Taking sips of PO  I/O 24 Hrs:  In: 5784 [I.V.:100]  Out: 700 [Urine:700]  I/O this shift:  In: 650 [I.V.:100]  Out: -     Physical Exam:  Temp:  [97 F (36.1 C)-98 F (36.7 C)] 97 F (36.1 C)  Pulse:  [52-93] 60  Resp:  [10-20] 13  BP: (109-168)/(62-118) 133/67  Gen: NAD  HEENT: MMM  Cv: RRR  Pulm: Unlabored on room air  Gi: Abdomen s/nd  Neuro: A&Ox3    Labs and Imaging:  Lab Results   Component Value Date    WBC 7.2 11/12/2019    HGB 11.5 11/12/2019    HCT 34.9 11/12/2019    PLTA 174 11/12/2019       Lab Results   Component Value Date    NA 143 11/12/2019    K 4.3 11/12/2019    CL 108 (H) 11/12/2019    CO2 25 11/12/2019    BUN 21 (H) 11/12/2019    CREAT 1.3 (H) 11/12/2019    TBILI 0.3 11/11/2019    AST 20 11/11/2019    ALT 37 11/11/2019    ALKPHOS 119 (H) 11/11/2019       Lab Results   Component Value Date    PT 10.0 06/15/2008    INR 1.0 (L) 06/15/2008    APTT 25.1 06/15/2008       XR OR C-Arm  Fluoroscopy was used by the clinician during the performance of a procedure as described in the clinician's procedure note. This represents documentation of the fluoroscopy time. It is not a description of the procedure or interpretation of the images obtained. For these details see the clinician's procedure note.    Fluoroscopy time: 15.8 seconds  Radiation dose (CAK): 7.01 mGy    Number of static images sent to PACS: 5        Reviewed and Electronically Signed by: Dewain Penning MD   Signed Date/Time: 11-12-2019 18:22:03         US Renal  CLINICAL INDICATION: recent L sided kidney stone. Persistent pain    COMPARISON: 09/23/2013    TECHNIQUE:    Renal ultrasound was performed with a curved array transducer in real-time. Static images were obtained.    FINDINGS:    Right kidney: 11.7 cm in sagittal dimension.  There is no hydronephrosis.  No calculi are appreciated. The cortical echotexture appears unremarkable.     Left kidney: 12.7 cm in sagittal dimension.  There is moderate left hydronephrosis and hydroureter of the proximal ureter. No calculi are appreciated. The cortical echotexture appears unremarkable.    Bladder: Unremarkable.    Ureteral jets: A right ureteral jet is present and no left ureteral jet is seen.    IMPRESSION:    1. Moderate left hydronephrosis.  2. No ureteral calculus.          Reviewed and Electronically Signed by: Frances Maywood MD   Signed Date/Time: 11-12-2019 08:51:40             Assessment and Plan:  Deborah [name] is a 54 year old female s/p ureteral stent doing ok. Not quite feeling ready to go home. Will plan for discharge home in AM.    Rica Records, MD

## 2019-11-12 NOTE — Progress Notes (Signed)
Patient screened per High Risk criteria. 53yo admitted with Obstructing Renal Calculus/Hydronephrosis. Chart reviewed. Patient's needs and condition reviewed and discussed in MDR. Plan is OR today. PTA, patient is independent with all needs. Discharge home with no services is planned when stable.   PLAN: Monitor progress, follow up for planning.

## 2019-11-12 NOTE — Op Note (Signed)
Full Operative Note       Patient: Deborah [name]    MRN: [id number]    Date: 11/12/19     Preoperative Diagnosis:  Left Ureteral ureteral stone(s)     Postoperative Diagnosis:   Left Ureteral ureteral stone(s)     Procedure(s):  L cysto/stent, possible laser litho     Surgeon(s):  Lerry Paterson, MD     Anesthesiologist: Candice Camp, MD          Anesthesia Type:   General    Blood Loss: None     Fluids in: Crystalloid only.     Specimen: No specimens were documented in this log.    Drains:  Implant Name Type Inv. Item Serial No. Manufacturer Lot No. LRB No. Used Action   CONTOUR URET. STENT [id number] - [id number] Stent CONTOUR URET. STENT [id number]  [employer] [id number] Left 1 Implanted        Complications: None.    Indications for Prodecure: The patient is a 54 year old female with a history of left ureteral stone and uncontrolled pain with multiple ER visits.  The patient presents today for Left ureteroscopy and laser lithotripsy, ureteral stent placement, and retrograde pyelography. The risks, benefits and alternatives to procedure explained to the patient in detail and the patient agreed to proceed. Consent was obtained.     Procedure Findings:   The patient had moderate hydronephrosis on the Left side. There was a single stone seen in the left, mid ureter measuring 9 mm in size. The stone was thoroughly fragmented using a Holmium laser fiber.    Description of Procedure: The patient was taken to the operating room, placed in supine position on the operating table. Once general anesthesia was achieved, the patient was given preoperative antibiotics consisting of Ancef. The patient was then placed in lithotomy position and prepped and draped in usual sterile fashion.      After a surgical time out, initial cystoscopy was performed with a 21-French cystoscope and cystopandendoscopy was performed as  described in the Findings.      The  Left ureteral orifice was identified and cannulated with an open ended ureteral catheter.  This was advanced to the distal ureter and a retrograde pyelogram was performed.  There was moderate hydronephrosis; I interpreted the findings and representative images were saved.  A wire was then passed into the renal pelvis.    An additional working wire was advanced into the renal pelvis and confirmed under fluoroscopy. The semi rigid ureteroscope was advanced to the calculus.  The calculus was fragmented with holmium laser lithotripsy.      The fragments were grasped with a zerotip basket and brought out of the body.  The fragments were sent to pathology for analysis.     One final pass of the ureteroscope confirmed that all sizable fragments were removed.   A pyelogram was then performed through the ureteroscope revealing no extravasation of contrast and no remaining filling defects. The ureteroscope was removed under direct vision.     The cystoscope was advanced over the wire into the bladder. A  6 Fr Variable length stent was advanced over the wire.  Good proximal curl was noted in the renal pelvis under fluoroscopy.  Good distal curl was directly visualized within the bladder.      At this point, the bladder was drained and all cystoscopic equipment was removed. The patient was then returned to supine position, awakened in the operating room and transported  to recovery room in stable condition.      ATTESTATION: I was the attending for this case and present for all aspects of the procedure including insertion and removal of all endoscopic equipment.     Plan for stent removal in 1 week in office

## 2019-11-12 NOTE — Narrator Note (Signed)
Patient Disposition    Patient left ED 5:53 AM.      Interpreter to provide instructions: No    Patient belongings with patient: YES    Have all existing LDAs been addressed? N/A    Have all IV infusions been stopped? N/A    Destination: Admit to:  Medical/surgical unit with transport    Pt transferred by this RN in stable condition. Wearing mask. No incident upon exit.

## 2019-11-12 NOTE — Narrator Note (Signed)
Patient is resting comfortably. 

## 2019-11-12 NOTE — Anesthesia Postprocedure Evaluation (Signed)
Anesthesia Post-Operative Evaluation Note    Patient: Deborah [name]           Procedure Summary     Date: 11/12/19 Room / Location: [facility] OR 4 / [facility] OR    Anesthesia Start: 6838 Anesthesia Stop: 7065    Procedure: L cysto/stent, possible laser litho (Left Ureter) Diagnosis:       Left ureteral stone      (Left ureteral stone [N20.1])    Surgeons: Lerry Paterson, MD Responsible Provider: Candice Camp, MD    Anesthesia Type: general ASA Status: 3            POST-OPERATIVE EVALUATION    Anesthesia Post Evaluation    Vitals signs in patient's normal range: Yes  Respiratory function stable; airway patent: Yes  Cardiovascular function stable: Yes  Hydration status stable: Yes  Mental status recovered; patient participates in evaluation and/or is at baseline: Yes  Pain control satisfactory: Yes  Nausea and vomiting control satisfactory: Yes    Procedure was labor & delivery no  PostOP disposition PACU  Anesthesia Observation no significant observation      Last vitals  Vitals Value Taken Time   BP 133/67 11/12/19 1841   Temp 97 F (36.1 C) 11/12/19 1841   Pulse 64 11/12/19 1846   Resp 13 11/12/19 1841   SpO2 100 % 11/12/19 1848   Vitals shown include unvalidated device data.

## 2019-11-12 NOTE — Narrator Note (Signed)
PT TO U/S VIA STRETCHER

## 2019-11-12 NOTE — H&P (Signed)
H&P NOTE     Chief Complaint:   Patient presents with:  Kidney Stones    HPI: 78F presenting to the [facility] ED for the 4th time in 2 weeks with left sided abdominal and flank pain.  She was originally seen on 7/27 and CT showed a 19mm left UPJ stone with hydronephrosis.  Her pain was adequately controlled in the ED and she was discharged home to follow-up with urology.  Since that time she has been seen three more times with ongoing left flank pain.  Each time she is given pain meds and discharged home feeling improved.  Tonight she returns again with persistent left abdomen and flank pain.  She had an episode of n/v after taking oxycodone at home but has otherwise not had any nausea.  She has tried ibuprofen and oxycodone at home with minimal relief.  She has not noticed and blood in her urine.  She denies any fevers or chills.  She is voiding without difficulty.  She has been taking Flomax as prescribed.  She denies cough, c/p, fever, SOB.       Review of Systems   Constitutional: Negative for chills, fever and unexpected weight change.   HENT: Negative for congestion and sore throat.    Respiratory: Negative for cough and shortness of breath.    Cardiovascular: Negative for chest pain.   Gastrointestinal: Positive for abdominal pain (left side radiating into left flank), nausea and vomiting (after taking oxycodone at home). Negative for constipation and diarrhea.   Genitourinary: Positive for flank pain (left). Negative for difficulty urinating, dysuria and hematuria.   Musculoskeletal: Negative for back pain.   Skin: Negative for rash.   Neurological: Negative for dizziness, light-headedness and headaches.   Psychiatric/Behavioral: The patient is not nervous/anxious.         Patient Active Problem List:  Patient Active Problem List:     Lump or mass in breast     Gastroesophageal reflux disease without esophagitis     Other specified gastritis     Abdominal pain, epigastric     Angioneurotic edema not  elsewhere classified     Plantar Fasciitis, L     Leg pain     Triggering of Finger, R 3rd     Right subacromial bursitis/rotator cuff tendinopathy     Chondromalacia patellae     Lower back pain     De Quervain's Tenosynovitis, R     CTS (Carpal Tunnel Syndrome), b/l     Family history of diabetes mellitus (DM)     Hypolipoproteinemia     Intermittent spinal claudication (HCC)     Vitamin D deficiency     Greater Trochanteric Bursitis, b/l     DDD (degenerative disc disease), lumbar     Obesity     Rapid Noctural Palpitations x 10 Seconds 2013.     Sweating     Microscopic hematuria     Reflux esophagitis     Lump of skin of lower extremity     Bilateral chronic knee pain     Bilateral arm pain     Positive occult stool blood test     Chronic right-sided low back pain with right-sided sciatica     SVT To 240 BPM March 2010. As Of 2017, Episodes q 2 Weeks, Usually Subside One Minute, Occ 15 Minutes.  Commonly Relieved With Vagal Maneuvers.     BMI 33.0-33.9,adult     Rapid Palpitations Only Once/Month, Lasting Seconds, Abolished by Valsalva,  Aug 2018.      Polyarthralgia     Macular chorioretinal scar of left eye     Hyperopia of both eyes with astigmatism and presbyopia     Osteoarthritis of both knees     Lateral epicondylitis of right elbow     Trigger middle finger of right hand     Primary osteoarthritis of first carpometacarpal joint of left hand     Right shoulder injury     Chronic right shoulder pain     Covid Infection March 2021 -- Ill But Not Hosp'd.     Left ureteral stone    Past Medical History:   Past Medical History:  No date: Esophageal reflux  No date: Heart disease  No date: Hyperopia  05/18/2017: Hyperopia of both eyes with astigmatism and presbyopia  No date: Irregular menstrual cycle  05/18/2017: Macular chorioretinal scar of left eye      Comment:  Temporal to fovea, left eye  No date: Pregnant state, incidental      Comment:  c/s breech  3/10: SVT (supraventricular tachycardia) (HCC)       Comment:  TCH  No date: Wears eyeglasses    Past Surgical History:   Past Surgical History:  No date: CESAREAN DELIVERY ONLY      Comment:  breech  No date: [place] BREAST SPECIMEN      Comment:  s/p fibroadenoma on the L,   No date: SEPTOPLASTY/SUBMUCOUS RESECJ W/WO CARTILAGE GRF    Medications prior to Admission:   (Not in a hospital admission)    Allergies:   Review of Patient's Allergies indicates:   Naproxen                Swelling    Comment:Ed visit with angioedema   Ibuprofen               Other (See Comments)    Comment:Epigastric pain, throat swelling?             05/06/12 pt states uses sometimes without issue   Morphine                Other (See Comments)    Comment:"THE LAST TIME I WAS HERE, THEY GAVE IT TO ME AND             TOLD ME TO TELL THE DOCTOR NOT TO GIVE IT TO ME             ANYMORE. IT MADE ME ACT DIFFERENT".   Pollen extract-tree*    Runny Nose    Comment:HA, itchy watery eyes    Social History:   Tobacco Use: Social History    Tobacco Use      Smoking status: Former Smoker        Packs/day: 0.50        Years: 27.00        Pack years: 13.5        Types: Cigarettes        Quit date: 06/28/2002        Years since quitting: 17.3      Smokeless tobacco: Never Used     Alcohol:   Alcohol use No       Family History: Review of patient's family history indicates:  Problem: Heart      Relation: Father          Age of Onset: (Not Specified)          Comment: arrhythmia  Problem: Hypertension  Relation: Mother          Age of Onset: (Not Specified)  Problem: Hypertension      Relation: Father          Age of Onset: (Not Specified)  Problem: Diabetes      Relation: Mother          Age of Onset: (Not Specified)  Problem: Diabetes      Relation: Maternal Uncle          Age of Onset: (Not Specified)  Problem: Diabetes      Relation: Maternal Uncle          Age of Onset: (Not Specified)  Problem: Glaucoma      Relation: Maternal Uncle          Age of Onset: (Not Specified)  Problem: Heart      Relation:  Maternal Grandmother          Age of Onset: (Not Specified)  Problem: Heart      Relation: Maternal Uncle          Age of Onset: (Not Specified)  Problem: Heart      Relation: Maternal Uncle          Age of Onset: (Not Specified)  Problem: Cancer - Other      Relation: FamHxNeg          Age of Onset: (Not Specified)  Problem: Cancer - Breast      Relation: FamHxNeg          Age of Onset: (Not Specified)  Problem: Cancer - Colon      Relation: FamHxNeg          Age of Onset: (Not Specified)  Problem: Cancer - Lung      Relation: FamHxNeg          Age of Onset: (Not Specified)  Problem: Cancer - Ovarian      Relation: FamHxNeg          Age of Onset: (Not Specified)      Intake/Output last 24hours (7a-7a):   I/O 24 Hrs:  In: 1000   Out: -     Vital Signs - Last 24 Hours:   BP: (132-168)/(83-118)   Temp:  [97.8 F (36.6 C)]   Pulse:  [67-89]   Resp:  [20]   SpO2:  [97 %-99 %]     Vital Signs - Last 8 Hours:   BP: (132-168)/(83-118)   Temp:  [97.8 F (36.6 C)]   Pulse:  [67-89]   Resp:  [20]   SpO2:  [97 %-99 %]     Physical Exam  Vitals and nursing note reviewed.   Constitutional:       General: She is not in acute distress.     Appearance: Normal appearance.   HENT:      Head: Normocephalic and atraumatic.      Mouth/Throat:      Pharynx: Oropharynx is clear.   Eyes:      Extraocular Movements: Extraocular movements intact.      Pupils: Pupils are equal, round, and reactive to light.   Cardiovascular:      Rate and Rhythm: Normal rate and regular rhythm.      Pulses: Normal pulses.      Heart sounds: Normal heart sounds.   Pulmonary:      Effort: Pulmonary effort is normal. No respiratory distress.      Breath sounds: Normal breath sounds.   Abdominal:  General: Bowel sounds are normal. There is no distension.      Palpations: Abdomen is soft.      Tenderness: There is abdominal tenderness (slight in LLQ). There is left CVA tenderness. There is no guarding or rebound.   Musculoskeletal:         General: Normal  range of motion.   Skin:     General: Skin is warm and dry.   Neurological:      General: No focal deficit present.      Mental Status: She is alert.       All data in last 24hours, labs only:  Recent labs:  Lab Results   Component Value Date    CA 9.1 11/11/2019    NA 143 11/11/2019    K 4.5 11/11/2019    CL 106 11/11/2019    MG 2.1 11/11/2019    CO2 26 11/11/2019    PHOS 3.9 06/16/2008    BUN 26 (H) 11/11/2019    CREAT 1.6 (H) 11/11/2019    GLUCOSER 110 11/11/2019     Lab Results   Component Value Date    WBC 11.7 (H) 11/11/2019    HGB 11.9 11/11/2019    HCT 35.8 11/11/2019    PLTA 194 11/11/2019    RBC 4.08 11/11/2019    ALBUMIN 4.0 11/11/2019     Results for JURLINE, FOLGER (MRN [id number]) as of 11/12/2019 02:08   Ref. Range 11/11/2019 23:49   COLOR Latest Ref Range: YELLOW  YELLOW   CLARITY Latest Ref Range: CLEAR  CLEAR   GLUCOSE, URINE Latest Ref Range: NEGATIVE MG/DL NEGATIVE   BILIRUBIN, URINE Latest Ref Range: NEGATIVE  NEGATIVE   KETONE, URINE Latest Ref Range: NEGATIVE MG/DL NEGATIVE   SPECIFIC GRAVITY URINE Latest Ref Range: 1.003 - 1.035  1.020   OCCULT BLOOD, URINE Latest Ref Range: NEGATIVE  SMALL (A)   PH URINE Latest Ref Range: 5.0 - 8.0  5.5   PROTEIN, URINE Latest Ref Range: NEGATIVE MG/DL NEGATIVE   NITRITE, URINE Latest Ref Range: NEGATIVE  NEGATIVE   LEUKOCYTE ESTERASE Latest Ref Range: NEGATIVE  NEGATIVE   MICROSCOPIC Latest Ref Range: NOT INDICAT  SEE RESULTS (A)   WHITE BLOOD CELLS URINE Latest Ref Range: 0 - 4 PER HPF MODERATE 5-10 (A)   RED BLOOD CELLS URINE Latest Ref Range: 0 - 2 PER HPF FEW 3-4 (A)   BACTERIA Latest Ref Range: 0 - 5 PER HPF 6-9 (A)   CRYSTALS Latest Ref Range: NONE SEEN  NONE SEEN   CASTS Latest Ref Range: NONE SEEN PER LPF NONE SEEN   SQUAMOUS EPITHELIAL CELLS Latest Ref Range: 0 - 4 PER LPF >10 (A)     Imaging:  CT of the A/P without contrast completed on 11/03/2019 showed:  CLINICAL INDICATION: Flank pain, kidney stone suspected   stone     COMPARISON: June 18, 2012     TECHNIQUE: Noncontrast CT of the abdomen and pelvis in prone position with multiplanar reformats. The study is read within the limitations of a non contrast CT.   Radiation Dose: Radiation dose reduction techniques were employed. CTDIvol: 11.2 mGy. DLP: 543 mGy-cm.     FINDINGS:   Quality: This a noncontrast study which provides less comprehensive assessment of the solid organs and vasculature.      Lower thorax: Unremarkable.     Liver: Hepatic steatosis     Gallbladder: No radiodense stones. No pericholecystic fluid.     Biliary: No biliary  ductal dilatation.     Pancreas: Unremarkable.     Spleen: Unremarkable.     Adrenals: Unremarkable.     Kidneys and ureters: Mild/moderate left hydronephrosis secondary to a 5 mm stone at the ureteropelvic junction. No right hydronephrosis. No nephrolithiasis.     Stomach: Unremarkable.     Bowel: Unremarkable.     Appendix: Unremarkable.     Peritoneal cavity: No ascites or fluid collection. No free air.     Bladder: Unremarkable.     Reproductive organs: Unremarkable.     Vessels: No aortic aneurysm.     Lymph nodes: No lymphadenopathy.     Abdominal wall superficial soft tissues: Small fat-containing umbilical hernia. A calcified the subcutaneous granulomas in bilateral gluteal regions.     Bones: Mild degenerative changes of the spine.     IMPRESSION:     Left hydronephrosis secondary to a 5 mm UPJ stone.     Reviewed and Electronically Signed by: Geanie Berlin   Signed Date/Time: 11-03-2019 07:57:38     Renal U/S completed this evening shows persistent left hydronephrosis.    A/P: 73F presenting to the [facility] ED for the 4th time in 2 weeks with left sided abdominal and flank pain.  She has a 69mm left UPJ stone that she has not been able to pass since original diagnosis on 7/27 despite pain control and flomax at home.  She returns tonight with ongoing pain.  She will be admitted for pain control.  NPO x meds with plan to proceed to the OR tomorrow for  cysto/stent/lithotripsy.  She will receive toradol for pain, she tolerated this in the ED.  IVF @ 150 and strain all urine.  D/W Dr. Gwinda Passe who is in agreement with this assessment and plan.    Ballard Russell, PA-C, 11/12/2019   Pager [phone]

## 2019-11-12 NOTE — Plan of Care (Signed)
Pt arrived to floor from ED around 0600. A&Ox3. Calm and cooperative w/ care. OOB independently. Tolerates well. Admission assessment completed via phone [place] interpretor. Denies pain at this time. No acute distress noted. No n/v. IVF infusing 150cc/hr per MD order. Surgical PA paged about heparin dose this morning awaiting OR placement for pt. Per surgical PA, morning dose of heparin given. Pt resting comfortably in bed at this time.     Bed in lowest position. Call light within reach. Needs met.

## 2019-11-12 NOTE — Narrator Note (Signed)
REPORT GIVEN TO J. SHIH, Therapist, sports.

## 2019-11-12 NOTE — Nursing Note (Signed)
Aromatabs placed on pt

## 2019-11-12 NOTE — Narrator Note (Signed)
Report given to [place] in [place] [facility]. All questions asked and answered.

## 2019-11-12 NOTE — Plan of Care (Addendum)
Pt A&Ox3. Denies pain. Urine strained no stone retained. OR prep complete. NPO per order. Report given to preop RN. No s/s of acute distress. VSS. Fall prec and safety maintained. Will CTM.     1315- C/o HA given prn tylenol with good effect

## 2019-11-12 NOTE — Narrator Note (Signed)
PT RETURNS TO ED FROM [place], RECONNECTED TO MONITORS. PT STATES PAIN IS GONE. VSS.

## 2019-11-12 NOTE — Anesthesia Preprocedure Evaluation (Signed)
Pre-Anesthetic Note  .  [place] AN TELEVISIT:   Is this a televisit?: No          Patient: Deborah [name] is a 54 year old female      Procedure Information     Date/Time: 11/12/19 1735    Procedure: L cysto/stent, possible laser litho (Left )    Diagnosis: Left ureteral stone [N20.1]    Pre-op diagnosis: Left ureteral stone [N20.1]    Location: [facility] / [facility]    Surgeons: Lerry Paterson, MD          Relevant Problems   No relevant active problems         Previous Anesthetic History:   Past Surgical History:  No date: CESAREAN DELIVERY ONLY      Comment:  breech  No date: [place] BREAST SPECIMEN      Comment:  s/p fibroadenoma on the L,   No date: SEPTOPLASTY/SUBMUCOUS RESECJ W/WO CARTILAGE GRF    Current Medications:    [MAR Hold] metoprolol (TOPROL-XL) 24 hr tablet 100 mg, 100 mg, Oral, Daily, Gaylen McCann, PA-C, 100 mg at 11/12/19 0849  [MAR Hold] tamsulosin (FLOMAX) capsule 0.4 mg, 0.4 mg, Oral, Daily, Gaylen McCann, PA-C, 0.4 mg at 11/12/19 0850  [MAR Hold] heparin (porcine) injection 5,000 Units, 5,000 Units, Subcutaneous, Q8H Singac, Gaylen McCann, PA-C, 5,000 Units at 11/12/19 0611  [facility] Hold] sodium chloride 0.9% infusion, , Intravenous, Continuous, Ballard Russell, PA-C, Last Rate: 150 mL/hr at 11/12/19 0606, New Bag at 11/12/19 0606  [MAR Hold] ondansetron (ZOFRAN) injection 4 mg, 4 mg, Intravenous, Q6H PRN, Ballard Russell, PA-C  [MAR Hold] acetaminophen (TYLENOL) tablet 650 mg, 650 mg, Oral, Q6H PRN, Reginia Forts, PA-C, 650 mg at 11/12/19 1317        Home Medications  [EXPIRED] tamsulosin (FLOMAX) 0.4 MG CAPS Capsule, Take 1 capsule by mouth daily  for 7 days, Disp: 7 capsule, Rfl: 0  naproxen (NAPROSYN) 500 MG tablet, Take 1 tablet by mouth every 12 (twelve) hours as needed for Pain as needed for pain. Take with food  for up to 10 days, Disp: 20 tablet, Rfl: 0  diclofenac (VOLTAREN) 1 % GEL Gel, APPLY TO BOTH HANDS, BOTH ELBOWS AND BOTH KNEES UP TO 4 TIMES A DAY. DO NOT EXCEED 32 GRAMS IN 24 HOURS, Disp: 100  g, Rfl: 1  CALCIUM + VITAMIN D3 600-5 MG-MCG TABS Tablet, TAKE 1 TABLET BY MOUTH TWO TIMES DAILY, Disp: 60 tablet, Rfl: 10  Cholecalciferol (VITAMIN D3) 50 MCG (2000 UT) TABS, TAKE 1 TABLET BY MOUTH DAILY, Disp: 30 tablet, Rfl: 11  metoprolol (TOPROL-XL) 100 MG 24 hr tablet, Take 1 tablet by mouth daily, Disp: 90 tablet, Rfl: 3  omeprazole (PRILOSEC) 20 MG capsule, Take 1 capsule by mouth 2 (two) times daily before meals, Disp: 60 capsule, Rfl: 11  acetaminophen (TYLENOL) 500 MG tablet, Take 500 mg by mouth every 6 (six) hours as needed for Pain, Disp: , Rfl:         Allergies:   Review of Patient's Allergies indicates:   Naproxen                Swelling    Comment:Ed visit with angioedema   Ibuprofen               Other (See Comments)    Comment:Epigastric pain, throat swelling?             05/06/12 pt states uses sometimes without issue  Morphine                Other (See Comments)    Comment:"THE LAST TIME I WAS HERE, THEY GAVE IT TO ME AND             TOLD ME TO TELL THE DOCTOR NOT TO GIVE IT TO ME             ANYMORE. IT MADE ME ACT DIFFERENT".   Pollen extract-tree*    Runny Nose    Comment:HA, itchy watery eyes    Smoking, Alcohol, Drugs:  Social History    Tobacco Use      Smoking status: Former Smoker        Packs/day: 0.50        Years: 27.00        Pack years: 13.5        Types: Cigarettes        Quit date: 06/28/2002        Years since quitting: 17.3      Smokeless tobacco: Never Used    Alcohol use: No      Alcohol/week: 0.0 standard drinks      Drug use: No       PMHx:  Past Medical History:  No date: Esophageal reflux  No date: Heart disease  No date: Hyperopia  05/18/2017: Hyperopia of both eyes with astigmatism and presbyopia  No date: Irregular menstrual cycle  05/18/2017: Macular chorioretinal scar of left eye      Comment:  Temporal to fovea, left eye  No date: Pregnant state, incidental      Comment:  c/s breech  3/10: SVT (supraventricular tachycardia) (HCC)      Comment:  TCH  No date: Wears  eyeglasses    Vitals  BP 144/83    Pulse 57    Temp 98 F (36.7 C) (Temporal)    Resp 12    Ht 5\' 3"  (1.6 m)    Wt 85.3 kg (188 lb)    LMP 10/17/2006    SpO2 98%    BMI 33.30 kg/m     ROS/MED HX    Physical Exam    General     Level of consciousness:  Alert and oriented (time, person, place)   Airway     Mallampati:  II    TM distance:  >3 FB    Mouth opening:  >3 FB    Neck ROM:  Mildly Limited   Teeth     Heart   - normal exam     Lungs - normal exam     Other findings: None loose per pt.              Pertinent Labs:   Lab Results   Component Value Date    NA 143 11/12/2019    K 4.3 11/12/2019    CREAT 1.3 (H) 11/12/2019    GLUCOSER 95 11/12/2019    WBC 7.2 11/12/2019    HCT 34.9 11/12/2019    PLTA 174 11/12/2019    PT 10.0 06/15/2008    APTT 25.1 06/15/2008    INR 1.0 (L) 06/15/2008         Anesthesia Plan    ASA Score:     ASA:  3    Airway:      Mallampati:  II    Mouth opening:  >3 FB    Neck ROM:  Mildly Limited    TM distance:  >3 FB  Plan: general    Other information:     EKG Reviewed: : Yes      Post-Plan::  PACU    Anesthesia Assessment and Plan:        Pt reports hx of "fibrillation".  Hx of SVT noted in chart.  Pt currently in NSR, rate 72.    Informed Consent:     Anesthetic plan and risks discussed with:  Patient   Patient Consented

## 2019-11-13 MED ORDER — PHENAZOPYRIDINE HCL 200 MG PO TABS
200.00 mg | ORAL_TABLET | Freq: Three times a day (TID) | ORAL | 0 refills | Status: AC | PRN
Start: 2019-11-13 — End: 2019-11-27

## 2019-11-13 MED ORDER — TAMSULOSIN HCL 0.4 MG PO CAPS
0.40 mg | ORAL_CAPSULE | Freq: Every day | ORAL | 0 refills | Status: AC
Start: 2019-11-13 — End: 2019-11-27

## 2019-11-13 MED ORDER — DOCUSATE SODIUM 100 MG PO CAPS
100.00 mg | ORAL_CAPSULE | Freq: Two times a day (BID) | ORAL | 0 refills | Status: AC
Start: 2019-11-13 — End: 2019-11-27

## 2019-11-13 MED ORDER — ACETAMINOPHEN 500 MG PO TABS
1000.0000 mg | ORAL_TABLET | Freq: Four times a day (QID) | ORAL | 0 refills | Status: DC | PRN
Start: 2019-11-13 — End: 2020-06-15

## 2019-11-13 MED ORDER — OXYBUTYNIN CHLORIDE 5 MG PO TABS
5.00 mg | ORAL_TABLET | Freq: Three times a day (TID) | ORAL | 0 refills | Status: AC | PRN
Start: 2019-11-13 — End: 2019-11-27

## 2019-11-13 MED ORDER — PHENAZOPYRIDINE HCL 100 MG PO TABS
200.0000 mg | ORAL_TABLET | Freq: Three times a day (TID) | ORAL | Status: DC | PRN
Start: 2019-11-13 — End: 2019-11-13
  Administered 2019-11-13: 200 mg via ORAL
  Filled 2019-11-13: qty 2

## 2019-11-13 NOTE — Progress Notes (Signed)
SURGERY ROUNDING PROGRESS NOTE:    HD#: 2  Post-op day #: 1     S/P: left sided cystoscopy, ureteroscopy, laser lithotripsy & stent placement    [place] phone interpreter    Subjective:   Pt reports dysuria and pressure to void. +HA but thinks this is related to not drinking water. Ate a small sandwich last night. Denies fevers, chill, nausea, vomiting.      Objective:  Vitals:  Tmax:   BP 124/79    Pulse 66    Temp 97.8 F (36.6 C)    Resp 18    Ht 5\' 3"  (1.6 m)    Wt 85.3 kg (188 lb)    LMP 10/17/2006    SpO2 98%    BMI 33.30 kg/m     Physical Exam:  Vital Signs: as above  General: Patient is well-developed, well-nourished, and in no apparent distress.  Eyes: Conjunctivae pink, no jaundice  Head and Face: Face is symmetric  Neuro: Alert and oriented to time, place, and person.  Affect is appropriate.  Neck: trachea midline.    Lungs: No respiratory distress, no intercostal retractions, no use of accessory muscles. CV: RRR  Abdomen: Soft, nontender, nondistended.  Skin: No visible corporeal dermatitis or mycosis.       LABS notable for:    Lab Results   Component Value Date    NA 143 11/12/2019    K 4.3 11/12/2019    CL 108 (H) 11/12/2019    CO2 25 11/12/2019    BUN 21 (H) 11/12/2019    CREAT 1.3 (H) 11/12/2019    GLUCOSER 95 11/12/2019    MG 2.1 11/12/2019    PHOS 3.0 11/12/2019    TBILI 0.3 11/11/2019    AST 20 11/11/2019    ALT 37 11/11/2019    ALKPHOS 119 (H) 11/11/2019     Lab Results   Component Value Date    WBC 7.2 11/12/2019    HGB 11.5 11/12/2019    HCT 34.9 11/12/2019    PLTA 174 11/12/2019       Assessment/Plan:  Patient is 54 year old, female, admitted for 86mm left UPJ stone s/p cystoscopy, ureteroscopy, laser lithotripsy POD#1. Doing well. Pain controlled. Will plan for DC later today.    DISCHARGE NOTE:  Admitting Dx: 74mm left UPJ stone with mild/moderate hydronephrosis  Admitting Condition: Good  DC Dx: 60mm left UPJ stone s/p ureteroscopy, laser lithotripsy & stent placement  DC Condition:  Good  Dispo: Home  No oxycodone - pt reports GI upset    Zenaida Deed, PA-C  Pager #: [phone]

## 2019-11-13 NOTE — Plan of Care (Signed)
Patient is a&ox4. HR 56. Metoprolol held per parameters. Denies N/V. voiding in bathroom. No stones observed or reported. Reports pain with urination, pyridium given. Independent with ADL's. Call light within reach.   Plan for discharge today. IV removed. D/C instructions completed. Patient verbalized understanding. Ambulated off unit with steady gait. Belongings sent with patient.

## 2019-11-13 NOTE — Plan of Care (Signed)
Patient a & o x 3. Patient denying any n/v or pain. Patient had lithotripsy / stent procedure this evening. Voiding well. BS present x4. Denied pain. VSS. No acute or safety issues.

## 2019-11-13 NOTE — Plan of Care (Signed)
Problem: Pain  Goal: Patient's pain/discomfort is manageable  Description: Assess and monitor patient's pain using appropriate pain scale. Collaborate with interdisciplinary team and initiate plan and interventions as ordered. Re-assess patient's pain level 30 - 60 minutes after pain management intervention.   Outcome: Progressing   Pt s/p cystoscopy/lithotripsy with left stent placement. Pt c/o burning after urination. MD notified, no new orders received. Tylenol given with good effect. Urine pink tinged, strained, no stones noted. IVF infusing at 100 ml/hr as ordered.

## 2019-11-13 NOTE — Discharge Instructions (Signed)
STENT URETERAL           Um stent ureteral  um tubo plstico mole, com diversos buracos. O stent  inserido na  uretra para ajudar a drenar a urina do rim para a bexiga. O tubo possui uma mola em  cada extremidade para evitar que ele se desloque. Uma extremidade permanece no rim.  A outra extremidade fica na bexiga. O stent no  visto de fora. Geralmente, ele no  o(a) impede de seguir sua rotina normal.  Um stent ureteral  usado para contornar um bloqueio no seu rim ou ureter. Esse  bloqueio pode ser causado por pedras no rim, tecido cicatricial, gravidez ou ter outras  causas. Ele tambm pode ser usado durante um tratamento para remover pedras do rim  ou possibilitar que o ureter cicatrize aps uma cirurgia. O stent permite que a urina seja  drenada do rim para a bexiga. Geralmente ele  retirado depois que o bloqueio   removido ou o ureter cicatriza. Se o stent for necessrio por um longo perodo, ele ser  trocado a cada poucos meses.     INSERINDO O STENT  Seu stent  colocado por um urologista. Ele  um mdico treinado para tratar problemas  genitourinrios (rim, ureter e bexiga). Antes de colocar seu stent, seu prestador de  cuidados pode solicitar um raio X ou outros exames de imagem dos seus rins e  ureteres. O stent  inserido no hospital ou no centro cirrgico em um nico dia. A  expectativa  ir para casa no mesmo dia.     DURANTE O PROCEDIMENTO   Uma mquina de raio X especial, chamada fluoroscpio,  usada para guiar a  insero do seu stent. Isso possibilita que o seu mdico garanta que o stent esteja  no local correto.   Primeiro voc recebe anestesia para ficar confortvel.   Ento seu mdico insere em sua bexiga um instrumento com iluminao especial,  chamado cistoscpio. Isso permite que o mdico veja a abertura do ureter.   Um fio fino  cuidadosamente introduzido na bexiga, subindo para o ureter. O  stent  inserido sobre o fio, e depois este  removido.     O QUE ESPERAR:   O stent  pode causar desconforto, variando de pessoa para pessoa.  Alguns relatam  uma dor persistente nas costas do lado do stent - especialmente ao urinar.  Alguns  relatam uma sensao de irritao na bexiga - sentem como se precisassem urinar  com muita frequncia.  Fornecemos analgsicos e um medicamento para anestesiar a  bexiga se voc sentir qualquer desconforto.     INSTRUES PARA CUIDADOS EM CASA   Enquanto o stent estiver colocado, voc pode sentir algum desconforto. Certos  movimentos podem causar dor ou uma sensao de precisar urinar. Seu prestador  de cuidados pode lhe dar analgsicos. Tome os medicamentos sem receita ou  prescritos para dor, desconforto ou febre conforme instrues do seu prestador de  cuidados.   Esteja ciente de que tomar Aspirina ou qualquer outro anticoagulante pode piorar  o sangramento     Voc pode receber medicamentos para prevenir espasmos na bexiga: Pyridium  200 mg - tome at trs vezes ao dia, conforme necessrio para  espasmo/queimao na bexiga   Beba lquidos em abundncia.   Voc pode ter pequenas quantidades de sangramento, fazendo sua urina ficar  levemente vermelha. No precisa se preocupar com isso.           Dieta: voc pode retomar sua dieta   regular.  No entanto, no primeiro dia, provavelmente  seja melhor comer alimentos leves para o estmago.  Beba lquidos em abundncia  hoje e amanh.     AtividadeKarena Addison atividades tranquilas hoje e ento retome suas atividades normais  conforme tolervel.   Evite exerccios pesados enquanto estiver com o stent.     Banho: voc poder usar [employer] ou banheira quando estiver totalmente  recuperado(a) da anestesia        POR FAVOR, [place] PARA [phone] OU Doreene Burke DE EMERGNCIA SE:   Tiver incontinncia (vazamento de urina).   Tiver temperatura oral acima de 102 F (38,9 C), calafrios    Nusea (mal-estar estomacal) ou vmito.   Sua dor no for aliviada pelos analgsicos.   A extremidade do stent sair pela  uretra.              PRESCRIES:  1. Oxicodona 5 mg: tome 1 comprimido a cada 4-6 horas, conforme necessrio, se o  Tylenol, Ibuprofeno e Pyridium no ajudarem a aliviar sua dor. No dirija aps a  ingesto. Trata-se de analgsico narctico.      2. Pyridium 200 mg: at trs vezes ao dia, conforme necessrio para aliviar a  queimao ao urinar/espasmos na bexiga     3. Tansulosina 0,4 mg: tome 1 comprimido ao dia     4. Colace 100 mg: tome 1 comprimido, duas vezes ao dia, enquanto estiver tomando  oxicodona, a fim de Dealer.  5. Ibuprofeno 600 mg: tome 1 comprimido a cada 6 horas, com comida, conforme  necessrio para aliviar a dor    6. Tylenol Extra Forte (sem receita): 500 mg a cada 6 horas, conforme necessrio para  aliviar a dor    7. Oxybutynin 5mg : at trs vezes ao dia, conforme necessrio para aliviar a  queimao ao urinar/espasmos na bexiga

## 2019-11-14 ENCOUNTER — Ambulatory Visit
Admission: RE | Admit: 2019-11-14 | Discharge: 2019-11-14 | Disposition: A | Discharge status: Home / Self Care | Payer: No Typology Code available for payment source | Attending: Clinical Cardiac Electrophysiology | Admitting: Clinical Cardiac Electrophysiology

## 2019-11-14 ENCOUNTER — Other Ambulatory Visit: Payer: Self-pay

## 2019-11-14 DIAGNOSIS — I471 Supraventricular tachycardia, unspecified: Secondary | ICD-10-CM

## 2019-11-17 ENCOUNTER — Encounter (HOSPITAL_BASED_OUTPATIENT_CLINIC_OR_DEPARTMENT_OTHER): Payer: Self-pay | Admitting: Clinical Cardiac Electrophysiology

## 2019-11-19 ENCOUNTER — Emergency Department
Admission: EM | Admit: 2019-11-19 | Discharge: 2019-11-20 | Disposition: A | Discharge status: Home / Self Care | Payer: No Typology Code available for payment source | Attending: Student in an Organized Health Care Education/Training Program | Admitting: Student in an Organized Health Care Education/Training Program

## 2019-11-19 ENCOUNTER — Emergency Department (HOSPITAL_BASED_OUTPATIENT_CLINIC_OR_DEPARTMENT_OTHER): Payer: No Typology Code available for payment source

## 2019-11-19 ENCOUNTER — Other Ambulatory Visit: Payer: Self-pay

## 2019-11-19 ENCOUNTER — Ambulatory Visit: Payer: No Typology Code available for payment source | Attending: Urology | Admitting: Urology

## 2019-11-19 ENCOUNTER — Encounter (HOSPITAL_BASED_OUTPATIENT_CLINIC_OR_DEPARTMENT_OTHER): Payer: Self-pay | Admitting: Urology

## 2019-11-19 ENCOUNTER — Encounter (HOSPITAL_BASED_OUTPATIENT_CLINIC_OR_DEPARTMENT_OTHER): Payer: Self-pay

## 2019-11-19 VITALS — BP 120/80 | HR 76 | Temp 97.9°F

## 2019-11-19 DIAGNOSIS — R109 Unspecified abdominal pain: Secondary | ICD-10-CM | POA: Insufficient documentation

## 2019-11-19 DIAGNOSIS — N200 Calculus of kidney: Secondary | ICD-10-CM | POA: Insufficient documentation

## 2019-11-19 DIAGNOSIS — M545 Low back pain: Secondary | ICD-10-CM

## 2019-11-19 DIAGNOSIS — D72829 Elevated white blood cell count, unspecified: Secondary | ICD-10-CM | POA: Insufficient documentation

## 2019-11-19 DIAGNOSIS — N1330 Unspecified hydronephrosis: Secondary | ICD-10-CM

## 2019-11-19 DIAGNOSIS — R10A2 Flank pain, left side: Secondary | ICD-10-CM

## 2019-11-19 LAB — CBC WITH PLATELET
ABSOLUTE NRBC COUNT: 0 10*3/uL (ref 0.0–0.0)
HEMATOCRIT: 35.7 % (ref 34.1–44.9)
HEMOGLOBIN: 12.3 g/dL (ref 11.2–15.7)
MEAN CORP HGB CONC: 34.5 g/dL (ref 31.0–37.0)
MEAN CORPUSCULAR HGB: 29.4 pg (ref 26.0–34.0)
MEAN CORPUSCULAR VOL: 85.4 fl (ref 80.0–100.0)
MEAN PLATELET VOLUME: 9.6 fL (ref 8.7–12.5)
NRBC %: 0 % (ref 0.0–0.0)
PLATELET COUNT: 219 10*3/uL (ref 150–400)
RBC DISTRIBUTION WIDTH STD DEV: 36.7 fL (ref 35.1–46.3)
RED BLOOD CELL COUNT: 4.18 M/uL (ref 3.90–5.20)
WHITE BLOOD CELL COUNT: 14.9 10*3/uL — ABNORMAL HIGH (ref 4.0–11.0)

## 2019-11-19 LAB — COMPREHENSIVE METABOLIC PANEL
ALANINE AMINOTRANSFERASE: 42 U/L (ref 12–45)
ALBUMIN: 4.2 g/dL (ref 3.4–5.0)
ALKALINE PHOSPHATASE: 115 U/L (ref 45–117)
ANION GAP: 12 mmol/L (ref 5–15)
ASPARTATE AMINOTRANSFERASE: 22 U/L (ref 8–34)
BILIRUBIN TOTAL: 0.3 mg/dL (ref 0.2–1.0)
BUN (UREA NITROGEN): 20 mg/dL — ABNORMAL HIGH (ref 7–18)
CALCIUM: 9.6 mg/dL (ref 8.5–10.1)
CARBON DIOXIDE: 25 mmol/L (ref 21–32)
CHLORIDE: 103 mmol/L (ref 98–107)
CREATININE: 1.1 mg/dL (ref 0.4–1.2)
ESTIMATED GLOMERULAR FILT RATE: 57 mL/min — ABNORMAL LOW (ref >60)
Glucose Random: 113 mg/dL (ref 74–160)
POTASSIUM: 3.6 mmol/L (ref 3.5–5.1)
SODIUM: 140 mmol/L (ref 136–145)
TOTAL PROTEIN: 8 g/dL (ref 6.4–8.2)

## 2019-11-19 LAB — URINALYSIS
BILIRUBIN, URINE: NEGATIVE
CASTS: NONE SEEN PER LPF
CRYSTALS: NONE SEEN
GLUCOSE, URINE: NEGATIVE MG/DL
KETONE, URINE: NEGATIVE MG/DL
NITRITE, URINE: NEGATIVE
PH URINE: 5.5 (ref 5.0–8.0)
PROTEIN, URINE: 30 MG/DL — AB
SPECIFIC GRAVITY URINE: 1.02 (ref 1.003–1.035)
SQUAMOUS EPITHELIAL CELLS: >10 PER LPF — AB (ref 0–4)

## 2019-11-19 LAB — POC URINALYSIS
BILIRUBIN, URINE: NEGATIVE
GLUCOSE,URINE: NEGATIVE
KETONE, URINE: NEGATIVE
NITRITE, URINE: NEGATIVE
PH URINE: 5 (ref 5.0–8.0)
PROTEIN, URINE: 30 — AB
SPECIFIC GRAVITY, URINE: 1.015 (ref 1.003–1.030)
UROBILINOGEN URINE: 0.2 (ref 0.2–1.0)

## 2019-11-19 MED ORDER — KETOROLAC TROMETHAMINE 15 MG/ML IJ SOLN
15.00 mg | Freq: Once | INTRAMUSCULAR | Status: AC
Start: 2019-11-19 — End: 2019-11-19
  Administered 2019-11-19: 15 mg via INTRAVENOUS

## 2019-11-19 MED ORDER — KETOROLAC TROMETHAMINE 15 MG/ML IJ SOLN
15.0000 mg | Freq: Once | INTRAMUSCULAR | Status: DC
Start: 2019-11-19 — End: 2019-11-19

## 2019-11-19 MED ORDER — PHENAZOPYRIDINE HCL 100 MG PO TABS
100.00 mg | ORAL_TABLET | Freq: Once | ORAL | Status: AC
Start: 2019-11-19 — End: 2019-11-19
  Administered 2019-11-19: 100 mg via ORAL
  Filled 2019-11-19: qty 1

## 2019-11-19 MED ORDER — ONDANSETRON 4 MG PO TBDP
4.00 mg | ORAL_TABLET | Freq: Once | ORAL | Status: AC
Start: 2019-11-19 — End: 2019-11-19
  Administered 2019-11-19: 4 mg via ORAL
  Filled 2019-11-19: qty 1

## 2019-11-19 MED ORDER — KETOROLAC TROMETHAMINE 15 MG/ML IJ SOLN
15.0000 mg | Freq: Once | INTRAMUSCULAR | Status: DC
Start: 2019-11-19 — End: 2019-11-19
  Filled 2019-11-19: qty 1

## 2019-11-19 MED ORDER — SULFAMETHOXAZOLE-TRIMETHOPRIM 800-160 MG PO TABS
1.00 | ORAL_TABLET | Freq: Two times a day (BID) | ORAL | 0 refills | Status: AC
Start: 2019-11-19 — End: 2019-11-22

## 2019-11-19 MED ORDER — ONDANSETRON HCL 4 MG/2ML IJ SOLN
4.0000 mg | Freq: Once | INTRAMUSCULAR | Status: DC
Start: 2019-11-19 — End: 2019-11-19

## 2019-11-19 NOTE — ED Triage Note (Addendum)
Pt to ED with c/o of left flank pain. Pt states her pain 8/10 and was recently seen in ED on 11/12/19 for similar symptoms.  VSS during triage.

## 2019-11-19 NOTE — ED Notes (Signed)
Pt signed out to me by ED provider at change of shift.    Patient CT scan does not demonstrate any obstructing kidney stones, there is some residual hydronephrosis for presumed passed stone. As discussed per signout plan patient was discharged with 3-day course of Bactrim    Impression: Leukocytosis, unspecified type  Acute left flank pain  Condition: stable  Disposition: Discharge

## 2019-11-19 NOTE — Progress Notes (Signed)
Deborah Courtois, RN, 11/19/2019    Patient is alert and oriented X 3, declined telephone interpreter.    Patient underwent Cystoscopy with left ureteral stent removal with Dr. Gwinda Passe, tolerated procedure well with VS returning to baseline.  No complaints of pain or discomfort. Teaching was done regarding importance of increased fluid intake, the possibility of burning on urination or blood in urine for the next couple of days, as well as when to call for questions or concerns.

## 2019-11-19 NOTE — ED Provider Notes (Signed)
EMERGENCY DEPARTMENT RESIDENT NOTE    The ED nursing record was reviewed.   The prior medical records as available electronically through Epic were reviewed.  This patient was seen with Emergency Department attending physician Dr. Gwenlyn Perking    CHIEF COMPLAINT    Patient presents with:  Kidney Problem      HPI    Deborah [name] is a 54 year old female [place] speaking w/ PMH GERD, recurrent episodes of SVT, hepatic steatosis, OA who presents to the ED with L flank pain. This morning around 10:30AM patient underwent cystoscopy w/ L ureteral stent removal (initially underwent cystoscopy/lithotripsy w/ L stent placement 11/12/19). Patient reports she felt okay afterwards until about 3:30pm, when she began to have intense pain on her L side. Pain has been constant. The pain feels sharp and travels from L lower back to L groin. Took tyelol x2 which did not help. Has also been nauseous, no vomiting. Has had small amount of urine today and noticed some blood in it (expected after stent removal procedure). No fever/chills.    REVIEW OF SYSTEMS    The pertinent positives are reviewed in the HPI above. All other systems were reviewed and are negative.    PAST MEDICAL HISTORY    Past Medical History:  No date: Esophageal reflux  No date: Heart disease  No date: Hyperopia  05/18/2017: Hyperopia of both eyes with astigmatism and presbyopia  No date: Irregular menstrual cycle  05/18/2017: Macular chorioretinal scar of left eye      Comment:  Temporal to fovea, left eye  No date: Pregnant state, incidental      Comment:  c/s breech  3/10: SVT (supraventricular tachycardia) (HCC)      Comment:  TCH  No date: Wears eyeglasses    PROBLEM LIST  Patient Active Problem List:     Lump or mass in breast     Gastroesophageal reflux disease without esophagitis     Other specified gastritis     Abdominal pain, epigastric     Angioneurotic edema not elsewhere classified     Plantar Fasciitis, L     Leg pain     Triggering of  Finger, R 3rd     Right subacromial bursitis/rotator cuff tendinopathy     Chondromalacia patellae     Lower back pain     De Quervain's Tenosynovitis, R     CTS (Carpal Tunnel Syndrome), b/l     Family history of diabetes mellitus (DM)     Hypolipoproteinemia     Intermittent spinal claudication (HCC)     Vitamin D deficiency     Greater Trochanteric Bursitis, b/l     DDD (degenerative disc disease), lumbar     Obesity     Rapid Noctural Palpitations x 10 Seconds 2013.     Sweating     Microscopic hematuria     Reflux esophagitis     Lump of skin of lower extremity     Bilateral chronic knee pain     Bilateral arm pain     Positive occult stool blood test     Chronic right-sided low back pain with right-sided sciatica     SVT To 240 bpm (diagnosed March 2010); on Toprol XL; recurrent symptoms relieved w/ vagal maneuvers     BMI 33.0-33.9,adult     Polyarthralgia     Macular chorioretinal scar of left eye     Hyperopia of both eyes with astigmatism and presbyopia  Osteoarthritis of both knees     Lateral epicondylitis of right elbow     Trigger middle finger of right hand     Primary osteoarthritis of first carpometacarpal joint of left hand     Right shoulder injury     Chronic right shoulder pain     Covid Infection March 2021 -- Ill But Not Hosp'd.     Left ureteral stone     Kidney stone    SURGICAL HISTORY    Past Surgical History:  11/12/2019: ------------OTHER-------------; Left      Comment:  Left ureteroscopy and laser lithotripsy, ureteral stent                placement, and retrograde pyelography  No date: [facility] DELIVERY ONLY      Comment:  breech  No date: [place] BREAST SPECIMEN      Comment:  s/p fibroadenoma on the L,   No date: SEPTOPLASTY/SUBMUCOUS RESECJ W/WO CARTILAGE GRF    CURRENT MEDICATIONS    No current facility-administered medications for this encounter.    Current Outpatient Medications:     tamsulosin (FLOMAX) 0.4 MG CAPS Capsule, Take 1 capsule by mouth daily  for 14 days, Disp: 14  capsule, Rfl: 0    phenazopyridine (PYRIDIUM) 200 MG tablet, Take 1 tablet by mouth 3 (three) times daily as needed for Pain  for up to 14 days, Disp: 15 tablet, Rfl: 0    acetaminophen (TYLENOL) 500 MG tablet, Take 2 tablets by mouth every 6 (six) hours as needed for Pain  for up to 14 days, Disp: 30 tablet, Rfl: 0    docusate sodium (COLACE) 100 MG capsule, Take 1 capsule by mouth 2 (two) times daily  for 14 days, Disp: 30 capsule, Rfl: 0    oxybutynin (DITROPAN) 5 MG tablet, Take 1 tablet by mouth 3 (three) times daily as needed (dysuria)  for up to 14 days, Disp: 42 tablet, Rfl: 0    diclofenac (VOLTAREN) 1 % GEL Gel, APPLY TO BOTH HANDS, BOTH ELBOWS AND BOTH KNEES UP TO 4 TIMES A DAY. DO NOT EXCEED 32 GRAMS IN 24 HOURS, Disp: 100 g, Rfl: 1    CALCIUM + VITAMIN D3 600-5 MG-MCG TABS Tablet, TAKE 1 TABLET BY MOUTH TWO TIMES DAILY, Disp: 60 tablet, Rfl: 10    Cholecalciferol (VITAMIN D3) 50 MCG (2000 UT) TABS, TAKE 1 TABLET BY MOUTH DAILY, Disp: 30 tablet, Rfl: 11    metoprolol (TOPROL-XL) 100 MG 24 hr tablet, Take 1 tablet by mouth daily, Disp: 90 tablet, Rfl: 3    omeprazole (PRILOSEC) 20 MG capsule, Take 1 capsule by mouth 2 (two) times daily before meals, Disp: 60 capsule, Rfl: 11    ALLERGIES    Review of Patient's Allergies indicates:   Naproxen                Swelling    Comment:Ed visit with angioedema   Ibuprofen               Other (See Comments)    Comment:Epigastric pain, throat swelling?             05/06/12 pt states uses sometimes without issue   Morphine                Other (See Comments)    Comment:"THE LAST TIME I WAS HERE, THEY GAVE IT TO ME AND             TOLD ME TO TELL THE  DOCTOR NOT TO GIVE IT TO ME             ANYMORE. IT MADE ME ACT DIFFERENT".   Pollen extract-tree*    Runny Nose    Comment:HA, itchy watery eyes    FAMILY HISTORY    Review of patient's family history indicates:  Problem: Heart      Relation: Father          Age of Onset: (Not Specified)          Comment:  arrhythmia  Problem: Hypertension      Relation: Mother          Age of Onset: (Not Specified)  Problem: Hypertension      Relation: Father          Age of Onset: (Not Specified)  Problem: Diabetes      Relation: Mother          Age of Onset: (Not Specified)  Problem: Diabetes      Relation: Maternal Uncle          Age of Onset: (Not Specified)  Problem: Diabetes      Relation: Maternal Uncle          Age of Onset: (Not Specified)  Problem: Glaucoma      Relation: Maternal Uncle          Age of Onset: (Not Specified)  Problem: Heart      Relation: Maternal Grandmother          Age of Onset: (Not Specified)  Problem: Heart      Relation: Maternal Uncle          Age of Onset: (Not Specified)  Problem: Heart      Relation: Maternal Uncle          Age of Onset: (Not Specified)  Problem: Cancer - Other      Relation: FamHxNeg          Age of Onset: (Not Specified)  Problem: Cancer - Breast      Relation: FamHxNeg          Age of Onset: (Not Specified)  Problem: Cancer - Colon      Relation: FamHxNeg          Age of Onset: (Not Specified)  Problem: Cancer - Lung      Relation: FamHxNeg          Age of Onset: (Not Specified)  Problem: Cancer - Ovarian      Relation: FamHxNeg          Age of Onset: (Not Specified)      SOCIAL HISTORY    Social History     Socioeconomic History    Marital status: Divorced     Spouse name: Not on file    Number of children: 1    Years of education: Not on file    Highest education level: Not on file   Occupational History    Occupation: housecleaning     Employer: [employer]   Tobacco Use    Smoking status: Former Smoker     Packs/day: 0.50     Years: 27.00     Pack years: 13.50     Types: Cigarettes     Quit date: 06/28/2002     Years since quitting: 17.4    Smokeless tobacco: Never Used   Substance and Sexual Activity    Alcohol use: No     Alcohol/week: 0.0 standard drinks    Drug use:  No    Sexual activity: Not Currently     Partners: Male     Comment: no STI hx; HIV neg 2002    Other Topics Concern    Military Service Not Asked    Blood Transfusions Not Asked    Caffeine Concern Not Asked    Occupational Exposure Not Asked    [place] Hazards Not Asked    Sleep Concern Not Asked    Stress Concern Yes    Weight Concern Yes    Special Diet Yes    Back Care Not Asked    Exercise No    Bike Helmet Not Asked    Seat Belt Yes    Self-Exams Yes   Social History Narrative    [place], [place]. To [place] 2000.    Lives with her son and her parents. Divorced from husband.    Denies DV. No guns in home.        No regular exercise. Dental care in place.   Social Determinants of Health  Financial Resource Strain:     Difficulty of Paying Living Expenses:   Food Insecurity:     Worried About Charity fundraiser in the Last Year:     Arboriculturist in the Last Year:   Transportation Needs:     Film/video editor (Medical):     Lack of Transportation (Non-Medical):   Physical Activity:     Days of Exercise per Week:     Minutes of Exercise per Session:   Stress:     Feeling of Stress :   Social Connections:     Frequency of Communication with Friends and Family:     Frequency of Social Gatherings with Friends and Family:     Attends Religious Services:     Active Member of Clubs or Organizations:     Attends Music therapist:     Marital Status:   Intimate Partner Violence:     Fear of Current or Ex-Partner:     Emotionally Abused:     Physically Abused:     Sexually Abused:     PHYSICAL EXAM      Vital Signs: BP 173/93    Pulse 96    Temp 98.4 F    Resp 20    Wt 85.3 kg (188 lb)    LMP 10/17/2006    SpO2 97%    BMI 33.30 kg/m    SBP 160s on repeat, pulse 116    Constitutional: alert, interactive, appears to be in moderate discomfort, speaking in full sentences, transcutaneous event recorder in place on L chest  Cardio.: tachycardic, regular rhythm. No MRG's. Radial pulses 2+ B/L; No LE edema  Pulmonary: Normal BS's bilat., No tachypnea,  wheezes, rales or rhonchi;  Non-labored w/o retractions or accessory muscle use, or tripoding  Abd.  Soft, NTND, No masses, rebound or guarding.  GU:  No CVA tenderness.  Musculoskeletal:  Moving all 4 extremities, No major deformities noted.  Neurological: No focal deficits noted.  Psychiatric: Appropriate for age and situation.    RESULTS  Results for orders placed or performed during the hospital encounter of 11/19/19 (from the past 24 hour(s))   Urinalysis    Collection Time: 11/19/19  8:06 PM   Result Value    COLOR YELLOW    CLARITY CLEAR    GLUCOSE, URINE NEGATIVE    BILIRUBIN, URINE NEGATIVE    KETONE, URINE NEGATIVE    SPECIFIC GRAVITY URINE 1.020  OCCULT BLOOD, URINE LARGE (A)    PH URINE 5.5    PROTEIN, URINE 30 (A)    NITRITE, URINE NEGATIVE    LEUKOCYTE ESTERASE TRACE (A)    MICROSCOPIC SEE RESULTS (A)    WHITE BLOOD CELLS URINE MANY 10-50 (A)    RED BLOOD CELLS URINE MANY 10-50 (A)    BACTERIA 10-50 (A)    CRYSTALS NONE SEEN    CASTS NONE SEEN    SQUAMOUS EPITHELIAL CELLS >10 (A)    Narrative    UCV&Urine, Clean Void   Urine Culture    Collection Time: 11/19/19  8:07 PM    Specimen: Urine, Clean Void    Narrative    Does the patient have an indwelling urinary catheter?->No  Is the culture being sent for one of the following  symptoms/conditions?->Other (please describe)  Please describe reason for culture->Requested by urology  team   CBC with Platelet    Collection Time: 11/19/19  8:12 PM   Result Value    WHITE BLOOD CELL COUNT 14.9 (H)    RED BLOOD CELL COUNT 4.18    HEMOGLOBIN 12.3    HEMATOCRIT 35.7    MEAN CORPUSCULAR VOL 85.4    MEAN CORPUSCULAR HGB 29.4    MEAN CORP HGB CONC 34.5    RBC DISTRIBUTION WIDTH STD DEV 36.7    PLATELET COUNT 219    MEAN PLATELET VOLUME 9.6    NRBC % 0.0    ABSOLUTE NRBC COUNT 0.0   Comprehensive Metabolic Panel    Collection Time: 11/19/19  8:12 PM   Result Value    SODIUM 140    POTASSIUM 3.6    CHLORIDE 103    CARBON DIOXIDE 25    ANION GAP 12    CALCIUM 9.6     Glucose Random 113    BUN (UREA NITROGEN) 20 (H)    TOTAL PROTEIN 8.0    ALBUMIN 4.2    BILIRUBIN TOTAL 0.3    ALKALINE PHOSPHATASE 115    ASPARTATE AMINOTRANSFERASE 22    CREATININE 1.1    ESTIMATED GLOMERULAR FILT RATE 57 (L)    ALANINE AMINOTRANSFERASE 42        RADIOLOGY  CT stone protocol pending.    EKG: none    PROCEDURES: none    MEDICATIONS ADMINISTERED ON THIS VISIT  Orders Placed This Encounter      phenazopyridine (PYRIDIUM) tablet 100 mg      ondansetron (ZOFRAN-ODT) disintegrating tablet 4 mg      ketorolac (TORADOL) injection 15 mg    ED COURSE & MEDICAL DECISION MAKING    ED Course as of Nov 18 2201   Wed Nov 19, 2019   2124 WBC 14.9, per urology will order CT stone protocol. If unremarkable can send home w/ 3 days bactrim and tylenol   CBC with Platelet(!)   2124 BUN, Cr, eGFR all improved compared to 8/4   Comprehensive Metabolic Panel(!)   1610 Comprehensive Metabolic Panel(!)   9604 UA w/ >10 squams, trace LE, neg nitrite   Urinalysis(!)   1 Spoke w/ urology, recommending f/u labs and response to toradol, if pain improves and labs OK could discharge - if not, can get CT abd stone protocol      1840 Patient seen and examined, in moderate discomfort, hypertensive to 540J systolic and tachycardic to 116, otherwise stable        I reviewed the patient's past medical history/problem list, past surgical history, medication list, social  history and allergies.    ED Decision Making & Course: Pt is a 54 year old female w/ PMH above who p/w L flank pain that began 4-5 hours after cystoscopy/ureteral stent removal around 10:30 this morning.  Pain was most likely due to postprocedural ureteral irritation which resolved completely w/ toradol, pyrimidine, and reported improvement in UOP. DDx for pain includes recurrent stone or post-procedure ureteral thrombus however both are unlikely given rapid resolution of symptoms. Labs notable for improvement in BUN and Cr compared to 8/4. UA difficult to interpret  given >10 squamous cells but negative nitrites. WBC is elevated to 14.9 so will get CT noncon stone protocol. If unremarkable, can d/c w/ 3 days bactrim and pain management. Appreciated urology's recommendations (pager [phone]).     Pt remained hemodynamically stable during their stay in the emergency department.     Follow Up:   Patient instructed to make appointment with PCP within one week.  Has renal ultrasound scheduled for 12/23/19   Follow up w/ urology (Dr. Callie Fielding) on 12/30/19     Clinical Impression:  Leukocytosis, unspecified type  Acute left flank pain    Disposition:   Discharge    Patient Condition:  [place] PGY-1, pager [phone]

## 2019-11-19 NOTE — Narrator Note (Signed)
Report given to RN for transition of care.

## 2019-11-19 NOTE — Progress Notes (Signed)
Clinic Procedure Note      The patient is [place] speaking, and encounter performed with phone interpreter.       Patient Name:   Deborah [name]     MRN:   [id number]     Date of Procedure:   11/19/2019     Pre-operative Diagnosis: Urolithiasis     Post-operative Diagnosis: Urolithiasis    Procedure: Cystoscopy, Ureteral Stent removal     Surgeon: Lerry Paterson, MD        Anesthesia: 2% Lidocaine jelly per urethra.     Estimated Blood Loss: None.     Fluids in: None.      Specimen:  None     Procedure Findings: The urethra was without stricture.  The ureteral orifices was seen in their expected locations and there was evidence of clear efflux.  There were no trabeculations.  There were no bladder or urethral mucosal lesions.  The indwelling ureteral stent was removed intact.     Indications for Prodecure: The patient is a 54 year old female with a history of urolithiasis s/p intervention involving stent placement who presents today for cystoscopy with stent removal.  The risks, benefits and alternatives to procedure explained to the patient in detail and the patient wishes to proceed. Consent was obtained.      Description of Procedure: The patient was taken to the office procedure room and placed in a frog leg position on the procedure table.  The external genitalia was prepped and draped in the usual fashion for cytoscopy.  Local anesthesia with 2% Lidocaine jelly per urethra was administrated.  After a surgical timeout, a flexible cystoscope was atraumatically passed into the bladder.  Cystopanendoscopy was performed.  The findings are as noted above.  The distal aspect of the indwelling ureteral stent was secured with a grasper and then withdrawn per urethra intact.  The patient tolerated the procedure without difficulty.      ATTESTATION: I was the attending for this case and present for all aspects of the procedure including insertion and removal of all endoscopic equipment.       54 y/o  F s/p Laser litho stent 11/12/19, ureteral stone    -check RUS 4 weeks    -RTc 4 weeks for RUS review    We discussed that I will be leaving [place] and have referred the patient to receive continued urologic care here at [facility] with one of my partners        Lerry Paterson, MD

## 2019-11-19 NOTE — Discharge Instructions (Addendum)
You came to the emergency room with pain in your L side that began a few hours after your ureteral stent was removed. Your pain got much better after receiving toradol (pain medicine similar to ibuprofen) and pyrimidine (pain medicine for your urinary tract).   Your blood tests showed that you have a high white blood cell count and signs of infection in your urine, which means you likely have a urinary tract infection (UTI). Your CT scan showed that you do not have any kidney stones.  Some of your kidney numbers remain elevated, but are improving compared to earlier in the month. The results of your urine culture (to get more information about your urinary tract infection) are still pending.    To Do:   Please take your antibiotics (Bactrim) one pill every 12 hours for three days.  Please schedule an appointment with your primary doctor (PCP) within one week. They can review the results of your urine culture with you.    Return to the Emergency Department If: you notice you are urinating much less than usual, you have new or worsening pain, trouble breathing, develop a fever, or any other new or concerning symptoms

## 2019-11-19 NOTE — Narrator Note (Signed)
RN giving handoff Greg RN  RN receiving handoff Kathy RN    Patient updated of plan of care Yes  White Board updated Yes  LDA's addressed Yes  Outstanding orders/meds addressed Yes  Patient Needs Addressed (Pain/Bathroom/ADL's) Yes

## 2019-11-20 LAB — URINE CULTURE

## 2019-11-20 MED ORDER — SULFAMETHOXAZOLE-TRIMETHOPRIM 800-160 MG PO TABS
1.00 | ORAL_TABLET | Freq: Once | ORAL | Status: AC
Start: 2019-11-20 — End: 2019-11-20
  Administered 2019-11-20: 1 via ORAL
  Filled 2019-11-20: qty 1

## 2019-11-20 NOTE — Narrator Note (Signed)
Patient Disposition  Patient education for diagnosis, medications, activity, diet and follow-up.  Patient left ED 1:30 AM.  Patient rep received written instructions.    Interpreter to provide instructions: No    Patient belongings with patient: YES    Have all existing LDAs been addressed? Yes    Have all IV infusions been stopped? Yes    Destination: Discharged to home. Patient given discharge instructions. No further questions at this time. Will follow up with PCP.

## 2019-11-21 LAB — CALCULI SPECTROSCOPY ANALYSIS
CALCIUM OXALATE DIHYDRATE: 20 %
CALCIUM OXALATE MONOHYDRATE: 80 %
CALCULI ANALYSIS WEIGHT: 33 mg

## 2019-11-25 ENCOUNTER — Ambulatory Visit (HOSPITAL_BASED_OUTPATIENT_CLINIC_OR_DEPARTMENT_OTHER): Payer: Self-pay | Admitting: Specialist

## 2019-11-28 ENCOUNTER — Other Ambulatory Visit: Payer: Self-pay

## 2019-12-10 ENCOUNTER — Ambulatory Visit (HOSPITAL_BASED_OUTPATIENT_CLINIC_OR_DEPARTMENT_OTHER): Payer: Self-pay | Admitting: Family Medicine

## 2019-12-10 ENCOUNTER — Inpatient Hospital Stay (HOSPITAL_BASED_OUTPATIENT_CLINIC_OR_DEPARTMENT_OTHER): Payer: Self-pay | Admitting: Family Medicine

## 2019-12-11 ENCOUNTER — Other Ambulatory Visit: Payer: Self-pay

## 2019-12-11 ENCOUNTER — Encounter (HOSPITAL_BASED_OUTPATIENT_CLINIC_OR_DEPARTMENT_OTHER): Payer: Self-pay | Admitting: Family Medicine

## 2019-12-11 ENCOUNTER — Ambulatory Visit: Payer: No Typology Code available for payment source | Attending: Family Medicine | Admitting: Family Medicine

## 2019-12-11 VITALS — BP 113/61 | HR 78 | Temp 97.9°F | Ht 61.0 in | Wt 184.0 lb

## 2019-12-11 DIAGNOSIS — Z1211 Encounter for screening for malignant neoplasm of colon: Secondary | ICD-10-CM | POA: Diagnosis present

## 2019-12-11 DIAGNOSIS — N201 Calculus of ureter: Secondary | ICD-10-CM | POA: Insufficient documentation

## 2019-12-11 DIAGNOSIS — Z1231 Encounter for screening mammogram for malignant neoplasm of breast: Secondary | ICD-10-CM | POA: Insufficient documentation

## 2019-12-11 LAB — CBC, PLATELET & DIFFERENTIAL
ABSOLUTE BASO COUNT: 0.1 10*3/uL (ref 0.0–0.1)
ABSOLUTE EOSINOPHIL COUNT: 0.1 10*3/uL (ref 0.0–0.8)
ABSOLUTE IMM GRAN COUNT: 0.03 10*3/uL (ref 0.00–0.03)
ABSOLUTE LYMPH COUNT: 3.3 10*3/uL (ref 0.6–5.9)
ABSOLUTE MONO COUNT: 0.7 10*3/uL (ref 0.2–1.4)
ABSOLUTE NEUTROPHIL COUNT: 4.9 10*3/uL (ref 1.6–8.3)
ABSOLUTE NRBC COUNT: 0 10*3/uL (ref 0.0–0.0)
BASOPHIL %: 0.6 % (ref 0.0–1.2)
EOSINOPHIL %: 1.2 % (ref 0.0–7.0)
HEMATOCRIT: 36.3 % (ref 34.1–44.9)
HEMOGLOBIN: 12.1 g/dL (ref 11.2–15.7)
IMMATURE GRANULOCYTE %: 0.3 % (ref 0.0–0.4)
LYMPHOCYTE %: 36.1 % (ref 15.0–54.0)
MEAN CORP HGB CONC: 33.3 g/dL (ref 31.0–37.0)
MEAN CORPUSCULAR HGB: 29.6 pg (ref 26.0–34.0)
MEAN CORPUSCULAR VOL: 88.8 fl (ref 80.0–100.0)
MEAN PLATELET VOLUME: 10.6 fL (ref 8.7–12.5)
MONOCYTE %: 7.9 % (ref 4.0–13.0)
NEUTROPHIL %: 53.9 % (ref 40.0–75.0)
NRBC %: 0 % (ref 0.0–0.0)
PLATELET COUNT: 217 10*3/uL (ref 150–400)
RBC DISTRIBUTION WIDTH STD DEV: 41.3 fL (ref 35.1–46.3)
RED BLOOD CELL COUNT: 4.09 M/uL (ref 3.90–5.20)
WHITE BLOOD CELL COUNT: 9.1 10*3/uL (ref 4.0–11.0)

## 2019-12-11 LAB — BASIC METABOLIC PANEL
ANION GAP: 6 mmol/L (ref 5–15)
BUN (UREA NITROGEN): 21 mg/dL — ABNORMAL HIGH (ref 7–18)
CALCIUM: 9.2 mg/dL (ref 8.5–10.1)
CARBON DIOXIDE: 27 mmol/L (ref 21–32)
CHLORIDE: 107 mmol/L (ref 98–107)
CREATININE: 1.2 mg/dL (ref 0.4–1.2)
ESTIMATED GLOMERULAR FILT RATE: 51 mL/min — ABNORMAL LOW (ref >60)
Glucose Random: 84 mg/dL (ref 74–160)
POTASSIUM: 4 mmol/L (ref 3.5–5.1)
SODIUM: 140 mmol/L (ref 136–145)

## 2019-12-11 NOTE — Progress Notes (Signed)
Review of Patient's Allergies indicates:   Naproxen                Swelling    Comment:Ed visit with angioedema   Ibuprofen               Other (See Comments)    Comment:Epigastric pain, throat swelling?             05/06/12 pt states uses sometimes without issue   Morphine                Other (See Comments)    Comment:"THE LAST TIME I WAS HERE, THEY GAVE IT TO ME AND             TOLD ME TO TELL THE DOCTOR NOT TO GIVE IT TO ME             ANYMORE. IT MADE ME ACT DIFFERENT".   Pollen extract-tree*    Runny Nose    Comment:HA, itchy watery eyes    Current Outpatient Medications   Medication Sig    diclofenac (VOLTAREN) 1 % GEL Gel APPLY TO BOTH HANDS, BOTH ELBOWS AND BOTH KNEES UP TO 4 TIMES A DAY. DO NOT EXCEED 32 GRAMS IN 24 HOURS    CALCIUM + VITAMIN D3 600-5 MG-MCG TABS Tablet TAKE 1 TABLET BY MOUTH TWO TIMES DAILY    Cholecalciferol (VITAMIN D3) 50 MCG (2000 UT) TABS TAKE 1 TABLET BY MOUTH DAILY    metoprolol (TOPROL-XL) 100 MG 24 hr tablet Take 1 tablet by mouth daily    omeprazole (PRILOSEC) 20 MG capsule Take 1 capsule by mouth 2 (two) times daily before meals     No current facility-administered medications for this visit.     Patient Active Problem List:     Lump or mass in breast     Gastroesophageal reflux disease without esophagitis     Other specified gastritis     Abdominal pain, epigastric     Angioneurotic edema not elsewhere classified     Plantar Fasciitis, L     Leg pain     Triggering of Finger, R 3rd     Right subacromial bursitis/rotator cuff tendinopathy     Chondromalacia patellae     Lower back pain     De Quervain's Tenosynovitis, R     CTS (Carpal Tunnel Syndrome), b/l     Family history of diabetes mellitus (DM)     Hypolipoproteinemia     Intermittent spinal claudication (HCC)     Vitamin D deficiency     Greater Trochanteric Bursitis, b/l     DDD (degenerative disc disease), lumbar     Obesity     Rapid Noctural Palpitations x 10 Seconds 2013.     Sweating      Microscopic hematuria     Reflux esophagitis     Lump of skin of lower extremity     Bilateral chronic knee pain     Bilateral arm pain     Positive occult stool blood test     Chronic right-sided low back pain with right-sided sciatica     SVT To 240 bpm (diagnosed March 2010); on Toprol XL; recurrent symptoms relieved w/ vagal maneuvers     BMI 33.0-33.9,adult     Polyarthralgia     Macular chorioretinal scar of left eye     Hyperopia of both eyes with astigmatism and presbyopia     Osteoarthritis of both knees     Lateral epicondylitis of  right elbow     Trigger middle finger of right hand     Primary osteoarthritis of first carpometacarpal joint of left hand     Right shoulder injury     Chronic right shoulder pain     Covid Infection March 2021 -- Ill But Not Hosp'd.     Left ureteral stone     Kidney stone  Here to f/u  [facility] on 11/19/19 for presumed dx of leukocystosis and acute L flank pain s/p cystoscopy and ureteral stent removal.  Initial  Cystoscopy and lithotripsy 11/12/19.  At ED on 11/19/19 was given flomax daily x 14 d and   3 d bactrim, which she said she took  No flank pain, dysuria, fever, n/v now but no prior h/o renal calc in her life.  Denies drinking soda and /or taking calcium supplements.    WBC at that time was slt elevated = 14.9, nl diff otherwise  BUN 20  Cr 1.1  Has f/u w/ uro on 12/30/19  GFR 57        O;GEN:well looking  woman, NAD, afeb  SKIN: nl  BJY:[id number] BS x 4 Qs, soft,no masses, n/t, except mild SPT, no CVAT, no rebound  > 25 min face to face; > 50% time counseling, coordinating care        (N20.1) Left ureteral stone  (primary encounter diagnosis)  Comment: stable now under urology's care  Plan:Recheck prev'ly elevated labs CBC, PLATELET & DIFFERENTIAL, BASIC METABOLIC         PANEL        Cont to hydrate well  Cont w/ uro for u/s, f/u  Seek urg med attn PRN fever, dysuria, inability to void, hematuria, flank pain, n/v, advised  Pt stated she understood and agreed to  do.      (Z12.31) Encounter for screening mammogram for malignant neoplasm of breast  Comment: due  Plan: [place] SCREENING MAMMO BILATERAL DIGITAL WITH DBT &        CAD            (Z12.11) Screen for colon cancer  Comment: disc'd colonoscopy but prefers to do IFOB now  Plan: POC IMMUNOASSAY FECAL OCCULT BLOOD TEST       If +, may need to reconsider colonoscopy, disc'd  Pt stated she understood and agreed to do.

## 2019-12-23 ENCOUNTER — Other Ambulatory Visit (HOSPITAL_BASED_OUTPATIENT_CLINIC_OR_DEPARTMENT_OTHER): Payer: Self-pay | Admitting: Internal Medicine

## 2019-12-23 ENCOUNTER — Other Ambulatory Visit: Payer: Self-pay

## 2019-12-23 ENCOUNTER — Ambulatory Visit
Admission: RE | Admit: 2019-12-23 | Discharge: 2019-12-23 | Disposition: A | Discharge status: Home / Self Care | Payer: No Typology Code available for payment source | Attending: Urology | Admitting: Urology

## 2019-12-23 DIAGNOSIS — N200 Calculus of kidney: Secondary | ICD-10-CM

## 2019-12-23 NOTE — Telephone Encounter (Signed)
-----   Message from Francetta Found sent at 12/23/2019  4:09 PM EDT -----  Regarding: metoprolol (TOPROL-XL) 100 MG 24 hr tablet  Deborah [name] [id number], 54 year old, female    Calls today:  Refill    !! Before starting refill request, check EPIC to see if encounter for this medication already exists !!    (May list multiple medications in this section)  Medicine Name: metoprolol (TOPROL-XL) 100 MG 24 hr tablet   Dosage:   Frequency (how many pills, how many times a day):  Take 1 tablet by mouth daily  Number of pills left:     Documented patient preferred pharmacies:    [facility] [place], [place].  Phone: [phone] Fax: [phone]    Person calling on behalf of patient: Patient (self)    CALL BACK NUMBER:  [phone]  Best time to call back: soon  Cell phone:   Other phone:    Patient's language of care: [place] ([place]    Patient does not need an interpreter.    Patient's PCP: Helane Gunther, APRN

## 2019-12-23 NOTE — Telephone Encounter (Signed)
PER Patient (self), Deborah [name] is a 54 year old female has requested a refill of metoprolol xl 100.      Last Office Visit: 12-11-19 with pcp  Last Physical Exam: 03-13-17    FECAL OCCULT BLOOD AGE 86+ due on 03/21/2018  PAP SMEAR due on 06/30/2019  HPV SCREENING due on 06/30/2019    HTN Med:    Most Recent BP Reading(s)  12/11/19 : 113/61  11/20/19 : (!) 159/103  11/19/19 : 120/80      Documented patient preferred pharmacies:    [facility] - [place].  Phone: [phone] Fax: [phone]

## 2019-12-26 ENCOUNTER — Telehealth (HOSPITAL_BASED_OUTPATIENT_CLINIC_OR_DEPARTMENT_OTHER): Payer: Self-pay

## 2019-12-26 NOTE — Telephone Encounter (Signed)
Called and spoke with patient via interpreter services [place] [phone]    Patient given information of event monitor from Dr.Joventino .    Patient states " maybe only one day, of fatigue ,it's been a super calm month, thank God".                         Message  Received: Gordan Payment, MD  [facility] Nurs[facility]  Please let her know that her event monitor did not show any arrhythmias - no SVT at all. Did she have any of her spells of fatigue while wearing the monitor? Thanks. Lilian                Cardiac Event Monitor  Order: [id number]  Status:  Final result Visible to patient:  Yes (not seen) Dx:  SVT (supraventricular tachycardia) ([place])   1 Result Note    Details    Reading Physician Reading Date Result Priority   [name], MD  [phone] 12/25/2019 Urgent     Narrative & Impression  Indication for testing: SVT-I47.1    Total Monitoring time: 27d 7h 88m    Baseline transmission: Sinus Rhythm @ 76 bpm     Average HR throughout monitoring: 78 bpm  High HR: 138 bpm on 11/24/2019 @ 19:18 EDT  Low HR: 47 bpm on 12/02/2019 @ 07:47 EDT    Ectopy Summary:     PVC's Analyzed: 0 Ectopy: - Burden: -   PAC's Analyzed: 5,027,741 Ectopy: 5,403 Burden: <1%      Auto-triggered transmissions: 8 device-triggered transmissions: Sinus Rhythm @ 47-138bpm    2 episodes of Sinus Bradycardia @ 47-59 bpm  5 episodes of Sinus Rhythm @ 69-93 bpm  1 episode of Sinus Tachycardia @ 138 bpm     Manually-triggered transmissions: No symptomatic events were reported     AV conduction disorders: None     Conclusions: Baseline rhythm at sinus at 76 bpm. There were no symptomatic recordings. There were 8 auto-triggered recordings which showed sinus at 47-138 bpm with no ectopy.                      Exam Ended: 12/25/19 11:08 Last Resulted: 12/25/19 21:48        Order Details     View Encounter     Lab and Collection Details     Routing     Result History            Scans on Order [id number]    Document  on 12/25/2019 11:17 AM by Ronette Deter, RN: Hassan Buckler Lie cardiac event monitor.pdf        Result Care Coordination        Result Notes     [name], MD   12/25/2019 10:26 PM EDT Back to Top        Please let her know that her event monitor did not show any arrhythmias - no SVT at all. Did she have any of her spells of fatigue while wearing the monitor? Thanks. Lilian       Patient Communication     Released  Not seen Back to Top

## 2019-12-30 ENCOUNTER — Other Ambulatory Visit: Payer: Self-pay

## 2019-12-30 ENCOUNTER — Ambulatory Visit: Payer: No Typology Code available for payment source | Attending: Specialist | Admitting: Specialist

## 2019-12-30 DIAGNOSIS — N200 Calculus of kidney: Secondary | ICD-10-CM | POA: Diagnosis not present

## 2019-12-30 LAB — URINALYSIS
BACTERIA: NONE SEEN PER HPF (ref 0–5)
BILIRUBIN, URINE: NEGATIVE
CASTS: NONE SEEN PER LPF
CRYSTALS: NONE SEEN
GLUCOSE, URINE: NEGATIVE MG/DL
KETONE, URINE: NEGATIVE MG/DL
LEUKOCYTE ESTERASE: NEGATIVE
NITRITE, URINE: NEGATIVE
PH URINE: 5.5 (ref 5.0–8.0)
PROTEIN, URINE: NEGATIVE MG/DL
SPECIFIC GRAVITY URINE: 1.02 (ref 1.003–1.035)
WHITE BLOOD CELLS URINE: NONE SEEN PER HPF (ref 0–4)

## 2019-12-30 LAB — POC URINALYSIS
BILIRUBIN, URINE: NEGATIVE
GLUCOSE,URINE: NEGATIVE
KETONE, URINE: NEGATIVE
LEUKOCYTE ESTERASE: NEGATIVE
NITRITE, URINE: NEGATIVE
PH URINE: 5.5 (ref 5.0–8.0)
PROTEIN, URINE: NEGATIVE
SPECIFIC GRAVITY, URINE: 1.015 (ref 1.003–1.030)
UROBILINOGEN URINE: 0.2 (ref 0.2–1.0)

## 2019-12-30 NOTE — Progress Notes (Signed)
CC: 54 y/o F s/p right laser litho stent 11/12/19, right ureteral stone.     This is a 54 year old female patient of Dr Gwinda Passe who has been referred today in follow up on stones. S/p uscope procedure 8/4 for 5 mm right ureteral stone.     To characterize the patient's chief complaint:  CT scan without evidence of additional stones on 11/19/2019 showed persistent hydronephrosis.     Here for follow up on [place] post op.     Stone analysis as follows:  CALCIUM OXALATE DIHYDRATE . % 20    CALCIUM OXALATE MONOHYDRATE . % 80      This was her first stone episode.   And her first kidney stone surgery.     Fluid intake: 4-5  Bottles water/d.     No renal colic at this time.   Had pain for the week following stent removal, but no fruther issues to report at present.  No freq, urgency, incontinence.   No dysuria.     Tobacco hx 20 pk yr hx, quit 21 yrs ago.     No fam h/o kidney stones.     Past Medical History:  No date: Esophageal reflux  No date: Heart disease  No date: Hyperopia  05/18/2017: Hyperopia of both eyes with astigmatism and presbyopia  No date: Irregular menstrual cycle  05/18/2017: Macular chorioretinal scar of left eye      Comment:  Temporal to fovea, left eye  No date: Pregnant state, incidental      Comment:  c/s breech  3/10: SVT (supraventricular tachycardia) ([facility])      Comment:  TCH  No date: Wears eyeglasses    Past Surgical History:  11/12/2019: ------------OTHER-------------; Left      Comment:  Left ureteroscopy and laser lithotripsy, ureteral stent                placement, and retrograde pyelography  No date: [facility] DELIVERY ONLY      Comment:  breech  No date: [place] BREAST SPECIMEN      Comment:  s/p fibroadenoma on the L,   No date: SEPTOPLASTY/SUBMUCOUS RESECJ W/WO CARTILAGE GRF    metoprolol (TOPROL-XL) 100 MG 24 hr tablet, TAKE 1 TABLET BY MOUTH DAILY, Disp: 90 tablet, Rfl: 3  diclofenac (VOLTAREN) 1 % GEL Gel, APPLY TO BOTH HANDS, BOTH ELBOWS AND BOTH KNEES UP TO 4 TIMES A DAY. DO NOT EXCEED 32 GRAMS  IN 24 HOURS, Disp: 100 g, Rfl: 1  CALCIUM + VITAMIN D3 600-5 MG-MCG TABS Tablet, TAKE 1 TABLET BY MOUTH TWO TIMES DAILY, Disp: 60 tablet, Rfl: 10  Cholecalciferol (VITAMIN D3) 50 MCG (2000 UT) TABS, TAKE 1 TABLET BY MOUTH DAILY, Disp: 30 tablet, Rfl: 11  omeprazole (PRILOSEC) 20 MG capsule, Take 1 capsule by mouth 2 (two) times daily before meals, Disp: 60 capsule, Rfl: 11    No current facility-administered medications on file prior to visit.      Review of Patient's Allergies indicates:   Naproxen                Swelling    Comment:Ed visit with angioedema   Ibuprofen               Other (See Comments)    Comment:Epigastric pain, throat swelling?             05/06/12 pt states uses sometimes without issue   Morphine  Other (See Comments)    Comment:"THE LAST TIME I WAS HERE, THEY GAVE IT TO ME AND             TOLD ME TO TELL THE DOCTOR NOT TO GIVE IT TO ME             ANYMORE. IT MADE ME ACT DIFFERENT".   Pollen extract-tree*    Runny Nose    Comment:HA, itchy watery eyes    Family History:  No history of kidney cancer.  No history of kidney stones.  No history of bladder cancer.   No history of prostate cancer.     Social History     Socioeconomic History    Marital status: Divorced     Spouse name: Not on file    Number of children: 1    Years of education: Not on file    Highest education level: Not on file   Occupational History    Occupation: housecleaning     Employer: [employer]   Tobacco Use    Smoking status: Former Smoker     Packs/day: 0.50     Years: 27.00     Pack years: 13.50     Types: Cigarettes     Quit date: 06/28/2002     Years since quitting: 17.5    Smokeless tobacco: Never Used   Substance and Sexual Activity    Alcohol use: No     Alcohol/week: 0.0 standard drinks    Drug use: No    Sexual activity: Not Currently     Partners: Male     Comment: no STI hx; HIV neg 2002   Other Topics Concern    Military Service Not Asked    Blood Transfusions Not Asked    Caffeine  Concern Not Asked    Occupational Exposure Not Asked    Hobby Hazards Not Asked    Sleep Concern Not Asked    Stress Concern Yes    Weight Concern Yes    Special Diet Yes    Back Care Not Asked    Exercise No    Bike Helmet Not Asked    Seat Belt Yes    Self-Exams Yes   Social History Narrative    [place], [place]. To [place] 2000.    Lives with her son and her parents. Divorced from husband.    Denies DV. No guns in home.        No regular exercise. Dental care in place.   Social Determinants of Health  Financial Resource Strain:     Difficulty of Paying Living Expenses: Not on file  Food Insecurity:     Worried About Charity fundraiser in the Last Year: Not on file    [employer] of Food in the Last Year: Not on file  Transportation Needs:     Lack of Transportation (Medical): Not on file    Lack of Transportation (Non-Medical): Not on file  Physical Activity:     Days of Exercise per Week: Not on file    Minutes of Exercise per Session: Not on file  Stress:     Feeling of Stress : Not on file  Social Connections:     Frequency of Communication with Friends and Family: Not on file    Frequency of Social Gatherings with Friends and Family: Not on file    Attends Religious Services: Not on file    Active Member of Clubs or Organizations: Not on file  Attends Archivist Meetings: Not on file    Marital Status: Not on file  Intimate Partner Violence:     Fear of Current or Ex-Partner: Not on file    Emotionally Abused: Not on file    Physically Abused: Not on file    Sexually Abused: Not on file  Social history has been reviewed in the chart, and no new changes are noted.      REVIEW OF SYSTEMS:  CONSTITUTIONAL: Negative for any fever, chills, weight loss or weight gain.  EYES: No eye pain, eye redness  ENT: No difficulty swallowing. No nose bleeds. No sore throat. No ear ache.  CARDIOVASCULAR: No chest pain. No palpitation. No fainting.  RESPIRATORY: No SOB. No  wheezing. No chest tightness. No cough.  GASTROINTESTINAL: No vomiting. No constipation. No diarrhea.  GENITOURINARY: Voiding as above in HPI.  NEUROLOGIC: No mental status changes or visual changes. No seizures or paresthesias.   ENDOCRINE: No excessive thirst.  HEMATOLOGIC: No excessive bruising. No excessive bleeding.  SKIN: No itchiness. No rashes.  PSYCHIATRIC: no anxiety. No hallucinations. no depression.   MUSCULOSKETAL: no joint swelling. Normal gait.       There were no vitals filed for this visit.      PHYSICAL EXAMINATION:   GENERAL: overweight.The patient is healthy-appearing. Well developed. Well nourished. No acute distress.   SKIN: No rashes. No lesions.  HEENT: Atraumatic, normocephalic. Tongue midline. Conjunctivae and sclerae are clear. Extraocular movements are full. Oral mucosa normal. Pharynx is negative.  NECK: Supple. There is no lymphadenopathy. No masses are noted. Trachea is midline.   LUNGS: Normal respiratory effort. No wheezing.  CARDIOVASCULAR: Regular pulses in upper extremities.   ABDOMEN: No organomegaly. No masses. No tenderness. No CVA tenderness.  NEUROLOGICAL: Alert and oriented X 3. Good gait and balance.  PSYCH: Mood stable. Affect normal.    LABS reviewed:    No results found for: PSA  No results found for: PSATOTAL  No results found for: PERCTFREEPSA      CREATININE (mg/dL)   Date Value   12/11/2019 1.2   11/19/2019 1.1   11/12/2019 1.3 (H)           IMAGING reviewed:  Cardiac Event Monitor  Indication for testing: SVT-I47.1    Total Monitoring time: 27d 7h 67m    Baseline transmission: Sinus Rhythm @ 76 bpm     Average HR throughout monitoring: 78 bpm  High HR: 138 bpm on 11/24/2019 @ 19:18 EDT  Low HR: 47 bpm on 12/02/2019 @ 07:47 EDT    Ectopy Summary:     PVC's Analyzed: 0 Ectopy: - Burden: -   PAC's Analyzed: 4,709,628 Ectopy: 5,403 Burden: <1%      Auto-triggered transmissions: 8 device-triggered transmissions: Sinus   Rhythm @ 47-138bpm    2 episodes of Sinus  Bradycardia @ 47-59 bpm  5 episodes of Sinus Rhythm @ 69-93 bpm  1 episode of Sinus Tachycardia @ 138 bpm     Manually-triggered transmissions: No symptomatic events were reported     AV conduction disorders: None     Conclusions: Baseline rhythm at sinus at 76 bpm. There were no symptomatic   recordings. There were 8 auto-triggered recordings which showed sinus at   47-138 bpm with no ectopy.        54 year old female patient seen today  in consultation  For 5 mm right ureteral stone, s/p uscope w/ dr Gwinda Passe 11/12/2019. No further stones on subsequent CT scan. [place]  without hydronephrosis.      Had been on calcium supplement.   PCP encouraged to stop.   We discussed dietary sources of calcium, and she was provided with lit on calcium sources today.     UA w/ persistent trace blood at this time. No additional stones.   Will send urine for formal ua/hpf.    Chk 24 h urine.     RTC in 1-2 mo with 24 h urine to further discuss, review of udip.     [place], PA-C

## 2019-12-30 NOTE — Patient Instructions (Signed)
Patient Education      Index Spanish Relatedtopics   Calcium in the Diet     ________________________________________________________________________  KEY POINTS   Calcium is a mineral that is important for your heart, bones, teeth, and muscles to stay healthy.   Eating or drinking certain things can cause you to lose calcium.   You can get calcium from dairy products, calcium-fortified foods, or from supplements. If you can get enough calcium in your diet, you do not need to take calcium supplements. Ask your healthcare provider or dietitian if you should take calcium supplements, and which kind you should take.  ________________________________________________________________________  What is calcium?  Calcium is a mineral that is very important for:   Heart health   Bone health   Teeth   Nerves   Muscles   Blood clotting  How much calcium do I need?  Many food products list the amount of calcium per serving on the label. Food labels list calcium as a percent (%) of the Daily Value (DV) based on 1,000 mg of calcium per day. Look for foods that provide 10% or more of the daily value for calcium.  The total amount of calcium you need, preferably from dairy foods, depends on your age and gender:      Group          Calcium/Day  --------------------------------------  Adults 19 to 50  1000 mg  Women 51 to 70   1200 mg  Men 51 to 70     1000 mg  Adults over 70   1200 mg  --------------------------------------  * mg = milligrams    What keeps me from getting enough calcium?  Here are some things that can make it harder for your body to get enough calcium:   Not getting enough vitamin D. Vitamin D increases the amount of calcium absorbed by your body. Its important to get enough sunlight to help your body make vitamin D and to choose foods that contain vitamin D. Milk contains vitamin D and some brands of cheese, yogurt, juice, and margarine have added vitamin D. Check labels for the amount of vitamin D per  serving. Fish such as salmon, mackerel, and tuna are good natural sources of vitamin D. If your provider recommends that you take a calcium supplement, there are some that include vitamin D.   Too much fiber in the diet. This is more of a concern if you have low amounts of calcium in your diet. Take calcium supplements or eat calcium-fortified foods 2 hours before or after eating 100% bran products. Soaking beans in water and discarding the liquid before cooking can also help.   Soft drinks, energy drinks, tea, and coffee. People who drink these products instead of milk often don't get enough calcium.   Taking some medicines. Medicines such as some antibiotic medicines, heartburn medicines that decrease stomach acid production, and antacids that contain aluminum can make it harder for your body to absorb calcium.  These things can cause you to lose calcium:   Eating a lot of protein-rich foods such as meats, poultry, and eggs. The more protein you eat, the more calcium you lose. As long as your diet is balanced and contains enough calcium, this should not be a problem.   Eating a lot of salt. The more salt in your diet, the more calcium you lose. Limit the salt in your diet. Cutting back on salt and getting enough calcium can help lower blood pressure and help prevent  fluid retention.  Milk products are one of the best sources of calcium. Calcium is also in many other foods such as kale, broccoli, beans, nuts, seeds, and soy. However, the calcium in these foods is not absorbed as well as the calcium in milk products. Calcium has been added to some foods (fortified), which makes it easier to meet daily calcium needs, but it still can be hard for your body to get enough calcium if dairy foods are not a part of your diet.  Do I need a calcium supplement?  If you can get enough calcium in your diet, you do not need to take calcium supplements. People who get too much calcium may have a higher risk for kidney  stones, stroke, and heart attack. Ask your healthcare provider or dietitian if you should take calcium supplements, and which kind you should take.  Calcium supplements of 1000 mg or less per day do not prevent fractures in postmenopausal women. However, there may be some benefit from higher doses of calcium supplements. If you are a postmenopausal woman and you have never had a fracture, ask your healthcare provider if you should take calcium supplements.  You may need a supplement if you:   Have digestive problems that prevent you from absorbing enough calcium   Avoid dairy products due to allergic or other reactions such as lactose intolerance or milk allergy   Don't eat any animal products   Do not get enough calcium in your diet   Are pregnant or breastfeeding   Have osteoporosis or osteopenia (weakened bones)   Have a vitamin D deficiency  There are many kinds of calcium supplements. The most common are calcium carbonate and calcium citrate.   Calcium carbonate is best absorbed with a meal.   Calcium citrate can be taken on a full or empty stomach. Calcium citrate may be a better choice for older adults or younger people who have low levels of stomach acid.   Calcium phosphate, lactate, and gluconate are also well absorbed. However, the amount of calcium per pill is lower, so you may need to take many pills a day to meet your dietary needs.  Your body absorbs calcium best by taking no more than 500 mg at a time and taking it two or more times per day as recommended by your healthcare provider. Look for calcium supplements that have the USP or Consumer Lab symbol on the label. Products with these labels have been tested to make sure they are absorbed by the body.  How can I eat the right amount of calcium?  Eat more calcium-rich foods. Here are some ideas for adding calcium to your diet.   Have low-fat or nonfat milk, cottage cheese with fruit, or yogurt for snacks.   Eat calcium-fortified breakfast  cereals with rice, almond, or soy milk, or have calcium-fortified waffles or pancakes.   Cook hot cereals with milk instead of water.   Serve yogurt or milk smoothies instead of juice.   Add yogurt to lunches or use a dip made with yogurt when having a fruit snack.   Add shredded cheese to baked potatoes, vegetables, soups, and salads.   Use milk when making cream soups instead of water.   Serve flavored milk or hot chocolate for an evening treat.   Use parmesan cheese topping for [place] dishes. A 2 tablespoon serving adds about 140 mg of calcium.   Serve a healthy vegetable pizza made with low-fat cheese.   Serve lean mozzarella  string cheese with crackers and fruit for a snack.   Make puddings with milk.   Stay physically active as advised by your provider. Certain physical activities, such as walking, jogging, dancing, and weight training, can help your body use calcium to strengthen your bones.  Some people cannot digest milk products because their bodies lack the enzyme needed to break down milk sugar. This problem is called lactose intolerance. If you have this problem, you can buy products such as Lactaid or Dairy Ease.  For more information contact:   The Academy of Nutrition and Dietetics  [phone]  PokerBag.at  Developed by [place].  Adult Advisor 2020.2 published by [place].  Last modified: 2018-05-13  Last reviewed: 2018-03-15  This content is reviewed periodically and is subject to change as new health information becomes available. The information is intended to inform and educate and is not a replacement for medical evaluation, advice, diagnosis or treatment by a healthcare professional.  References   Adult Advisor 2020.2 Index     2020 [place] and/or one of its subsidiaries              Patient Education      Index Spanish Relatedtopics   Calcium in the Diet      ________________________________________________________________________  KEY POINTS   Calcium is a mineral that is important for your heart, bones, teeth, and muscles to stay healthy.   Eating or drinking certain things can cause you to lose calcium.   You can get calcium from dairy products, calcium-fortified foods, or from supplements. If you can get enough calcium in your diet, you do not need to take calcium supplements. Ask your healthcare provider or dietitian if you should take calcium supplements, and which kind you should take.  ________________________________________________________________________  What is calcium?  Calcium is a mineral that is very important for:   Heart health   Bone health   Teeth   Nerves   Muscles   Blood clotting  How much calcium do I need?  Many food products list the amount of calcium per serving on the label. Food labels list calcium as a percent (%) of the Daily Value (DV) based on 1,000 mg of calcium per day. Look for foods that provide 10% or more of the daily value for calcium.  The total amount of calcium you need, preferably from dairy foods, depends on your age and gender:      Group          Calcium/Day  --------------------------------------  Adults 19 to 50  1000 mg  Women 51 to 70   1200 mg  Men 51 to 70     1000 mg  Adults over 70   1200 mg  --------------------------------------  * mg = milligrams    What keeps me from getting enough calcium?  Here are some things that can make it harder for your body to get enough calcium:   Not getting enough vitamin D. Vitamin D increases the amount of calcium absorbed by your body. Its important to get enough sunlight to help your body make vitamin D and to choose foods that contain vitamin D. Milk contains vitamin D and some brands of cheese, yogurt, juice, and margarine have added vitamin D. Check labels for the amount of vitamin D per serving. Fish such as salmon, mackerel, and tuna are good natural sources  of vitamin D. If your provider recommends that you take a calcium supplement, there are some that include  vitamin D.   Too much fiber in the diet. This is more of a concern if you have low amounts of calcium in your diet. Take calcium supplements or eat calcium-fortified foods 2 hours before or after eating 100% bran products. Soaking beans in water and discarding the liquid before cooking can also help.   Soft drinks, energy drinks, tea, and coffee. People who drink these products instead of milk often don't get enough calcium.   Taking some medicines. Medicines such as some antibiotic medicines, heartburn medicines that decrease stomach acid production, and antacids that contain aluminum can make it harder for your body to absorb calcium.  These things can cause you to lose calcium:   Eating a lot of protein-rich foods such as meats, poultry, and eggs. The more protein you eat, the more calcium you lose. As long as your diet is balanced and contains enough calcium, this should not be a problem.   Eating a lot of salt. The more salt in your diet, the more calcium you lose. Limit the salt in your diet. Cutting back on salt and getting enough calcium can help lower blood pressure and help prevent fluid retention.  Milk products are one of the best sources of calcium. Calcium is also in many other foods such as kale, broccoli, beans, nuts, seeds, and soy. However, the calcium in these foods is not absorbed as well as the calcium in milk products. Calcium has been added to some foods (fortified), which makes it easier to meet daily calcium needs, but it still can be hard for your body to get enough calcium if dairy foods are not a part of your diet.  Do I need a calcium supplement?  If you can get enough calcium in your diet, you do not need to take calcium supplements. People who get too much calcium may have a higher risk for kidney stones, stroke, and heart attack. Ask your healthcare provider or dietitian if  you should take calcium supplements, and which kind you should take.  Calcium supplements of 1000 mg or less per day do not prevent fractures in postmenopausal women. However, there may be some benefit from higher doses of calcium supplements. If you are a postmenopausal woman and you have never had a fracture, ask your healthcare provider if you should take calcium supplements.  You may need a supplement if you:   Have digestive problems that prevent you from absorbing enough calcium   Avoid dairy products due to allergic or other reactions such as lactose intolerance or milk allergy   Don't eat any animal products   Do not get enough calcium in your diet   Are pregnant or breastfeeding   Have osteoporosis or osteopenia (weakened bones)   Have a vitamin D deficiency  There are many kinds of calcium supplements. The most common are calcium carbonate and calcium citrate.   Calcium carbonate is best absorbed with a meal.   Calcium citrate can be taken on a full or empty stomach. Calcium citrate may be a better choice for older adults or younger people who have low levels of stomach acid.   Calcium phosphate, lactate, and gluconate are also well absorbed. However, the amount of calcium per pill is lower, so you may need to take many pills a day to meet your dietary needs.  Your body absorbs calcium best by taking no more than 500 mg at a time and taking it two or more times per day as recommended by your  healthcare provider. Look for calcium supplements that have the USP or Consumer Lab symbol on the label. Products with these labels have been tested to make sure they are absorbed by the body.  How can I eat the right amount of calcium?  Eat more calcium-rich foods. Here are some ideas for adding calcium to your diet.   Have low-fat or nonfat milk, cottage cheese with fruit, or yogurt for snacks.   Eat calcium-fortified breakfast cereals with rice, almond, or soy milk, or have calcium-fortified waffles or  pancakes.   Cook hot cereals with milk instead of water.   Serve yogurt or milk smoothies instead of juice.   Add yogurt to lunches or use a dip made with yogurt when having a fruit snack.   Add shredded cheese to baked potatoes, vegetables, soups, and salads.   Use milk when making cream soups instead of water.   Serve flavored milk or hot chocolate for an evening treat.   Use parmesan cheese topping for [place] dishes. A 2 tablespoon serving adds about 140 mg of calcium.   Serve a healthy vegetable pizza made with low-fat cheese.   Serve lean mozzarella string cheese with crackers and fruit for a snack.   Make puddings with milk.   Stay physically active as advised by your provider. Certain physical activities, such as walking, jogging, dancing, and weight training, can help your body use calcium to strengthen your bones.  Some people cannot digest milk products because their bodies lack the enzyme needed to break down milk sugar. This problem is called lactose intolerance. If you have this problem, you can buy products such as Lactaid or Dairy Ease.  For more information contact:   The Academy of Nutrition and Dietetics  [phone]  PokerBag.at  Developed by [place].  Adult Advisor 2020.2 published by [place].  Last modified: 2018-05-13  Last reviewed: 2018-03-15  This content is reviewed periodically and is subject to change as new health information becomes available. The information is intended to inform and educate and is not a replacement for medical evaluation, advice, diagnosis or treatment by a healthcare professional.  References   Adult Advisor 2020.2 Index     2020 [place] and/or one of its subsidiaries

## 2019-12-30 NOTE — Progress Notes (Signed)
Addendum    I saw and examined the patient and agree with the assessment and plan of the above note.    54 yo female hx laser URS with Dr Gwinda Passe 11/11/19.  Ca Ox stone.    Followup renal US Neg    Check 24h urine    RTC 88M, dip    REVIEW OF SYSTEMS:  CONSTITUTIONAL: Negative for any fever, weight loss or weight gain.  HEENT: No visual problems. No sore throat.   CARDIOVASCULAR: No chest pain/palpitation.  PULMONARY: Denies any wheeze. No SOB  GASTROINTESTINAL: Denies any nausea, vomiting. No constipation.  GENITOURINARY: Voiding as above in HPI.  NEUROLOGIC: No mental status changes or visual changes. No seizures or paresthesias. No neurologic changes.  ENDOCRINE: no diabetes. no thyroid disease.  SKIN: Denies any skin lesions. No rashes.  PSYCHIATRIC: no psychiatric illness.  MUSCULOSKETAL:  Normal gait.       Past Medical History: reviewed, and no sig changes.   Past Family History: reviewed, and no sig changes.  Social History: reviewed, and no sig changes.  Medications: list reviewed, and without any sig interactions/side effects, unless as noted above in HPI.      PHYSICAL EXAMINATION:  GENERAL: The patient is healthy-appearing. Well developed. Well nourished. No acute distress.  SKIN: No rashes. No lesions.  HEENT: Atraumatic, normocephalic. Tongue midline. Conjunctivae and sclerae are clear. Extraocular movements are full. Oral mucosa normal. Pharynx is negative.  NECK: Supple. There is no lymphadenopathy. No masses are noted. Trachea is midline.   LUNGS: Normal respiratory effort. No wheezing.  CARDIOVASCULAR: Regular pulses in upper extremities.   ABDOMEN: No organomegaly. No masses. No tenderness. No CVA tenderness.  NEUROLOGICAL: Alert and oriented. Good gait and balance.            Toula Moos MD

## 2020-01-06 ENCOUNTER — Encounter (HOSPITAL_BASED_OUTPATIENT_CLINIC_OR_DEPARTMENT_OTHER): Payer: Self-pay | Admitting: Clinical Cardiac Electrophysiology

## 2020-01-06 ENCOUNTER — Ambulatory Visit
Payer: No Typology Code available for payment source | Attending: Clinical Cardiac Electrophysiology | Admitting: Clinical Cardiac Electrophysiology

## 2020-01-06 ENCOUNTER — Other Ambulatory Visit: Payer: Self-pay

## 2020-01-06 VITALS — BP 125/80 | HR 74 | Temp 96.0°F | Resp 18 | Ht 64.0 in | Wt 180.0 lb

## 2020-01-06 DIAGNOSIS — I471 Supraventricular tachycardia, unspecified: Secondary | ICD-10-CM

## 2020-01-06 NOTE — Progress Notes (Signed)
OUTPATIENT ELECTROPHYSIOLOGY PROGRESS NOTE  Date of visit: 01/06/2020    Patient returns for F/u of SVT.    In summary patient is 54 year old female with the following medical problems:  SVT To 240 bpm (diagnosed March 2010); on Toprol XL; recurrent symptoms relieved w/ vagal maneuvers  (primary encounter diagnosis)    Patient was last evaluated in [facility] on 11/10/2019 via a Televisit.    54 year old woman who was diagnosed with very rapid SVT art 240 bpm in March 2010. She still has recurrent but short lived palpitations and also reported intermittent bouts of sudden loss of energy during the day such as when she is working. These events lasted about 20 minutes.     I ordered a 30-day event monitor but she did not have any recurrent palpitations or spells of loss of energy while wearing the monitor. Event monitor did not show any significant arrhythmias. She did have a recurrent episode of sudden onset palpitations about 3 nights after she returned the monitor. Around 2 am, she woke up feeling like she had to go to the bathroom then when she sat up in bed, palpitations started suddenly. She was able to terminate symptoms with vagal maneuvers after 2-3 minutes. No further palpitations since then.     Interestingly, she reports no recurrence of the loss of energy spells since her ureteral stent was removed on 11/19/2019. She had a L kidney stone leading to several ED visits in late July/early August 2021 and had an ureteral stent placed on 11/12/19 then removed on 11/19/19.     She feels much better since the events surrounding her kidney stone/ureteral stent placement and removal. Her energy level is good and she has been working without cardiovascular limitations. No LH, syncope or presyncope.    She complains, however, of significant joint pains involving her knees and hands.     Current Medications:  metoprolol (TOPROL-XL) 100 MG 24 hr tablet, TAKE 1 TABLET BY MOUTH DAILY, Disp: 90 tablet, Rfl:  3  diclofenac (VOLTAREN) 1 % GEL Gel, APPLY TO BOTH HANDS, BOTH ELBOWS AND BOTH KNEES UP TO 4 TIMES A DAY. DO NOT EXCEED 32 GRAMS IN 24 HOURS, Disp: 100 g, Rfl: 1  CALCIUM + VITAMIN D3 600-5 MG-MCG TABS Tablet, TAKE 1 TABLET BY MOUTH TWO TIMES DAILY, Disp: 60 tablet, Rfl: 10  Cholecalciferol (VITAMIN D3) 50 MCG (2000 UT) TABS, TAKE 1 TABLET BY MOUTH DAILY, Disp: 30 tablet, Rfl: 11  omeprazole (PRILOSEC) 20 MG capsule, Take 1 capsule by mouth 2 (two) times daily before meals, Disp: 60 capsule, Rfl: 11    No current facility-administered medications on file prior to visit.      Review of Patient's Allergies indicates:   Naproxen                Swelling    Comment:Ed visit with angioedema   Ibuprofen               Other (See Comments)    Comment:Epigastric pain, throat swelling?             05/06/12 pt states uses sometimes without issue   Morphine                Other (See Comments)    Comment:"THE LAST TIME I WAS HERE, THEY GAVE IT TO ME AND             TOLD ME TO TELL THE DOCTOR NOT TO GIVE IT TO  ME             ANYMORE. IT MADE ME ACT DIFFERENT".   Pollen extract-tree*    Runny Nose    Comment:HA, itchy watery eyes    FAMILY HISTORY: Father has AFib. Mother is diabetes and has HTN.    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 Lives with son (40 yrs), father and mother. No cigs, EtOH, or drugs.     ROS: All other systems were pertinently reviewed and were negative unless otherwise outlined above.    PHYSICAL EXAM:  General: Overweight; normal appearance for age; no apparent distress   01/06/20  1340   BP: 125/80   Site: Left Arm   Position: Sitting   Cuff Size: Regular   Pulse: 74   Resp: 18   Temp: 96 F (35.6 C)   TempSrc: Temporal   SpO2: 97%   Weight: 81.6 kg (180 lb)   Height: 5\' 4"  (1.626 m)     HEENT: Oral mucosa is moist, no icterus, no pallor noted.  NECK: Supple. No thyromegaly.  CHEST: Clear to auscultation, no crackles or rhonchi. No wheezes.  HEART: Normal JVP.  Carotid pulse are +2 bilaterally with normal upstrokes and no bruits. Regular rate and rhythm. No murmurs, rubs, or gallops.   ABDOMEN: Soft, no organomegaly detected. No abdominal bruits.  EXTREMITIES: Warm and well perfused. Peripheral pulses are +2 bilaterally in both upper and lower extremities. There is no evidence of peripheral pedal edema.    PSYCHIATRIC: Mood and affect are appropriate.  SKIN: No rash observed.    PERTINENT INVESTIGATIONS:  1. EKG 07/11/2019 (on my personal review): Sinus rhythm at 73 bpm. .142/.84/.434. QRS axis 53. Normal tracing.  2. LABS:   Lab Results   Component Value Date    NA 140 12/11/2019    K 4.0 12/11/2019    CL 107 12/11/2019    CO2 27 12/11/2019    BUN 21 (H) 12/11/2019    CREAT 1.2 12/11/2019    GLUCOSER 84 12/11/2019     Lab Results   Component Value Date    LDL 121 02/17/2019    HDL 44 02/17/2019    TG 197 (H) 02/17/2019     ECHO 02/22/2016:  1. LV ejection fraction is 60%.    2. There are no regional wall motion abnormalities.    3. Normal study.    4. Compared to the previous study dated 04/18/11, there is no significant change.    30-Day Event Monitor Aug/Sept 2021:  Baseline rhythm at sinus at 76 bpm. There were no symptomatic recordings. There were 8 auto-triggered recordings which showed sinus at 47-138 bpm with no ectopy.      ASSESSMENT:  54 year old woman with structurally normal heart on echo and normal baseline ECG who presented with her first episode of rapid SVT in March 2010. She is treated with Toprol XL and has frequent recurrent but short lived symptoms although followed by several hours of fatigue. She also reported intermittent spells of "low energy" separate from palpitations which have resolved since kidney stone issues have resolved.    Options for treatment of the patient's SVT were discussed in detail again today including medication treatment and/or electrophysiology study(EPS)/ablation. The EPS/ablation procedure was explained in detail along with  its risks and benefits. The patient demonstrated good understanding and has opted for medical therapy. She will consider SVT ablation if symptoms progress.  PLAN:  (I47.1) SVT To 240 bpm (diagnosed March 2010); on Toprol XL; recurrent symptoms relieved w/ vagal maneuvers  (primary encounter diagnosis)  Comment: Suspect AVNRT although I cannot retrieve her SVT ECG from March 2010.  Plan:   1. Continue Toprol XL 100 mg qPM.  2. If symptoms progress, will consider SVT ablation.      Return for follow up in Cardiology Clinic in 6 months. To contact earlier if there are any cardiac related issues.  I spent a total of 35 minutes on this visit on the date of service (total time includes all activities performed on the date of service).    This Cardiology Division patient encounter note was created using voice-recognition software and in real time during the clinic visit. Please excuse any typographical errors that have not been edited out.     Electronically signed by: [name], MD, 01/06/2020 8:18 PM

## 2020-01-15 ENCOUNTER — Encounter (HOSPITAL_BASED_OUTPATIENT_CLINIC_OR_DEPARTMENT_OTHER): Payer: Self-pay | Admitting: Family Medicine

## 2020-02-03 ENCOUNTER — Ambulatory Visit: Payer: No Typology Code available for payment source | Attending: Family Medicine

## 2020-02-03 ENCOUNTER — Ambulatory Visit
Admission: RE | Admit: 2020-02-03 | Discharge: 2020-02-03 | Disposition: A | Discharge status: Home / Self Care | Payer: No Typology Code available for payment source | Attending: Urology | Admitting: Urology

## 2020-02-03 ENCOUNTER — Other Ambulatory Visit: Payer: Self-pay

## 2020-02-03 DIAGNOSIS — N200 Calculus of kidney: Secondary | ICD-10-CM | POA: Diagnosis not present

## 2020-02-03 DIAGNOSIS — Z1211 Encounter for screening for malignant neoplasm of colon: Secondary | ICD-10-CM | POA: Insufficient documentation

## 2020-02-03 LAB — POC IMMUNOASSAY FECAL OCCULT BLOOD TEST
LOT #: 16004
POC FECAL OCCULT BLOOD TEST (IMMUNOASSAY): NEGATIVE

## 2020-02-03 NOTE — Progress Notes (Signed)
Pt dropped off IFOB specimen.  Specimen collected on: 10/23  21  Released IFOB open order.  Tested specimen, result is NEGATVIE.  Created and sent letter to pt.  Jennette Banker, [place], 02/03/2020

## 2020-02-06 ENCOUNTER — Encounter (HOSPITAL_BASED_OUTPATIENT_CLINIC_OR_DEPARTMENT_OTHER): Payer: Self-pay | Admitting: Physician Assistant

## 2020-02-06 ENCOUNTER — Telehealth (HOSPITAL_BASED_OUTPATIENT_CLINIC_OR_DEPARTMENT_OTHER): Payer: Self-pay | Admitting: Physician Assistant

## 2020-02-06 DIAGNOSIS — M2550 Pain in unspecified joint: Secondary | ICD-10-CM

## 2020-02-06 NOTE — Telephone Encounter (Signed)
This is a 54 yo female who is not well known to me but has requested a referral for second opinion with Rheumatology. Specifically to see Dr. [name]. Jessieca's PCP recently retired so I am covering in the mean time. In review of her chart, she was last seen by Rheum on 06/09/19 with Dr. Marcine Matar. Diffuse body pains that date back to 2019. Polyarthralgia thought to be related to mechanical causes. Bilateral knee OA, Right shoulder OA, R lateral epicondylitis, RF 1:2. She has an allergy to NSAIDs. I am referring for second opinion, specifically with Dr. [name] in Rheumatology.

## 2020-02-06 NOTE — Telephone Encounter (Signed)
-----   Message from [name], MD sent at 02/06/2020  9:58 AM EDT -----  Regarding: RE: pt called to remind dr. Luiz Blare to put in referral for rhumatology  Thank you!    [name],  Please tell pt that referral is being placed; the delay may have been related to her doctor's recent retirement.    Thanks,  [name]  ----- Message -----  From: [name], PA-C  Sent: 02/06/2020   7:53 AM EDT  To: [name], MD, Rosita Fire  Subject: RE: pt called to remind dr. Luiz Blare to put#    [name],    Thanks for this information. [name] has retired from [employer] about 2 weeks ago and I'm covering her inbox so this is the first I'm hearing about this. That said, I am more than happy to put a second opinion in to Rheum for patient to see Dr. [name]. I will have our Referral coordinator assist in scheduling. Please let me know if there is anything else that I can assist with in the mean time.    Lennette Bihari  ----- Message -----  From: [name], MD  Sent: 02/05/2020  10:17 PM EDT  To: Helane Gunther, APRN, Rosita Fire  Subject: RE: pt called to remind dr. Luiz Blare to put#    [name],    I am sending this to her PCP as referrals to specialist should go through her PCP. I had sent a message to [name] (cc'd above) about it but she would have to be the one actually referring her to Rheum. I am cc'ing her again here.    [name] - She has diffuse joint pain. Had seen Dr. Marcine Matar before but she was not overly happy.Marland KitchenMarland KitchenShe ws interested in seeing another Rheumatologist for a second opinion. What do you think about referring her to Del[facility] Goody?     Thanks,  [name]  ----- Message -----  From: Rosita Fire  Sent: 02/05/2020   3:13 PM EDT  To: [name], MD  Subject: pt called to remind dr. Luiz Blare to put in #    Deborah [name] [id number], 54 year old, female, Telephone Information:   Home Phone      [phone]  Work Phone      Not  on file.  Mobile          Not on file.      Patient's Preferred Pharmacy:     [facility], [place].  Phone: [phone] Fax: [phone]      CONFIRMED TODAY: No    CALL BACK NUMBER: home number  Best time to call back:   Cell phone:   Other phone:    Available times:    Patient's language of care: [place]    Patient does not need an interpreter.    Patient's PCP: Helane Gunther, APRN    Person calling on behalf of patient: Patient (self)    Calls today with a sick call. patietn called and stated that dr. Luiz Blare put in a referral for rhumatology for a doctor Anderson Malta ? I do not a see a referral so paitent called to remind dr. Luiz Blare to put the referral in

## 2020-02-10 ENCOUNTER — Other Ambulatory Visit: Payer: Self-pay

## 2020-02-10 ENCOUNTER — Ambulatory Visit: Payer: No Typology Code available for payment source | Attending: Specialist | Admitting: Specialist

## 2020-02-10 DIAGNOSIS — N200 Calculus of kidney: Secondary | ICD-10-CM | POA: Insufficient documentation

## 2020-02-10 LAB — POC URINALYSIS
BILIRUBIN, URINE: NEGATIVE
GLUCOSE,URINE: NEGATIVE
KETONE, URINE: NEGATIVE
LEUKOCYTE ESTERASE: NEGATIVE
NITRITE, URINE: NEGATIVE
OCCULT BLOOD, URINE: NEGATIVE
PH URINE: 5.5 (ref 5.0–8.0)
PROTEIN, URINE: NEGATIVE
SPECIFIC GRAVITY, URINE: <=1.005 (ref 1.003–1.030)
UROBILINOGEN URINE: 0.2 (ref 0.2–1.0)

## 2020-02-10 NOTE — Progress Notes (Signed)
Addendum    I discussed the patient and agree with the assessment and plan of the above note.    54 yo female hx kidney stones. 24H urine unsuccessful.  Pt declined K-cit    RTC 54M, [place]  Toula Moos MD

## 2020-02-10 NOTE — Progress Notes (Signed)
54 yo female hx laser URS with Dr Gwinda Passe Nov 23, 2019.  Ca Ox stone.    Has passed a stone previously on her own.      Left uscope procedure w/ Dr Gwinda Passe.     Pt did collect a UA for 24 h urine, but this never processed per her records.     No pain at this time. No fever/chills.     Fluid intake: 4 bottles water/d.     Here for follow up on udip, 24 h urine    REVIEW OF SYSTEMS:  CONSTITUTIONAL: Negative for any fever, weight loss or weight gain.  HEENT: No visual problems. No sore throat.   CARDIOVASCULAR: No chest pain/palpitation.  PULMONARY: Denies any wheeze. No SOB  GASTROINTESTINAL: Denies any nausea, vomiting. No constipation.  GENITOURINARY: Voiding as above in HPI.  NEUROLOGIC: No mental status changes or visual changes. No seizures or paresthesias. No neurologic changes.  ENDOCRINE: no diabetes. no thyroid disease.  SKIN: Denies any skin lesions. No rashes.  PSYCHIATRIC: no psychiatric illness.  MUSCULOSKETAL:  Normal gait.       Past Medical History: reviewed, and no sig changes.   Past Family History: reviewed, and no sig changes.  Social History: reviewed, and no sig changes.  Medications: list reviewed, and without any sig interactions/side effects, unless as noted above in HPI.      PHYSICAL EXAMINATION:  GENERAL: The patient is healthy-appearing. Well developed. Well nourished. No acute distress.  SKIN: No rashes. No lesions.  HEENT: Atraumatic, normocephalic. Tongue midline. Conjunctivae and sclerae are clear. Extraocular movements are full. Oral mucosa normal. Pharynx is negative.  NECK: Supple. There is no lymphadenopathy. No masses are noted. Trachea is midline.   LUNGS: Normal respiratory effort. No wheezing.  CARDIOVASCULAR: Regular pulses in upper extremities.   ABDOMEN: No organomegaly. No masses. No tenderness. No CVA tenderness.  NEUROLOGICAL: Alert and oriented. Good gait and balance.      COLOR (no units)   Date Value   02/10/2020 YELLOW   12/30/2019 YELLOW     CLARITY (no units)   Date Value    02/10/2020 CLEAR   12/30/2019 CLEAR     GLUCOSE, URINE (MG/DL)   Date Value   12/30/2019 NEGATIVE     GLUCOSE,URINE (no units)   Date Value   02/10/2020 NEGATIVE     BILIRUBIN, URINE (no units)   Date Value   02/10/2020 NEGATIVE   12/30/2019 NEGATIVE     KETONE, URINE   Date Value   02/10/2020 NEGATIVE   12/30/2019 NEGATIVE MG/DL     No components found for: UASGU  OCCULT BLOOD, URINE (no units)   Date Value   02/10/2020 NEGATIVE   12/30/2019 TRACE (A)     PH URINE (no units)   Date Value   02/10/2020 5.5   12/30/2019 5.5     PROTEIN, URINE   Date Value   02/10/2020 NEGATIVE   12/30/2019 NEGATIVE MG/DL     NITRITE, URINE (no units)   Date Value   02/10/2020 NEGATIVE   12/30/2019 NEGATIVE     LEUKOCYTE ESTERASE (no units)   Date Value   04/20/1999 NEGATIVE     MICROSCOPIC (no units)   Date Value   12/30/2019 SEE RESULTS (A)     WHITE BLOOD CELLS URINE (PER HPF)   Date Value   12/30/2019 NONE SEEN     RED BLOOD CELLS URINE (PER HPF)   Date Value   12/30/2019 RARE 0-2     BACTERIA (  PER HPF)   Date Value   12/30/2019 NONE SEEN     CRYSTALS (no units)   Date Value   12/30/2019 NONE SEEN     CASTS (PER LPF)   Date Value   12/30/2019 NONE SEEN     SQUAMOUS EPITHELIAL CELLS (PER LPF)   Date Value   12/30/2019 0-4     No components found for: UATRA  RENAL EPITHELIAL CELLS (PER LPF)   Date Value   11/03/2019 NONE SEEN     No results found for: UAAMM  No results found for: UAAMO  No results found for: UACAC  No results found for: UACAO  No results found for: UACAP  No results found for: UATRI  No results found for: UAUAC  No results found for: UACGC  No results found for: UAFGC  No results found for: UAHYA  No results found for: [facility]  No results found for: UAWAX  No results found for: UAWCC  No results found for: UAMUC  No results found for: UAYEA  No results found for: UACOM      54 year old with h/o stones x 2, passed one with MET, another requiring uscope w/ dr Gwinda Passe.     Calc oxalate stone.     -24 h urine  Pt dropped  of urine for process, and felt was very tedious process, but the order was cancelled as per following:     "WRONG 24HR BOTTLE COLLECTED.TEST CANCELED.MICHAEL FROM [facility]NOTIFIED ON 02/10/20 DV6 WRONG BOTTLE COLLECTED"     Offered Kcit per stone history x 2.   Does not wish to start a medicine at this time.     Encouraged increase in fluid intake.     RTC in 6 mo with US renal prior.     [place], PA-C

## 2020-02-12 ENCOUNTER — Telehealth (HOSPITAL_BASED_OUTPATIENT_CLINIC_OR_DEPARTMENT_OTHER): Payer: Self-pay | Admitting: Internal Medicine

## 2020-02-12 NOTE — Telephone Encounter (Signed)
Spoke to patient via interpreter regarding rescheduling appointment with Dr.Chapnick from 04/12/20 to 04/13/20

## 2020-03-15 ENCOUNTER — Other Ambulatory Visit (HOSPITAL_BASED_OUTPATIENT_CLINIC_OR_DEPARTMENT_OTHER): Payer: Self-pay | Admitting: Family Medicine

## 2020-03-15 NOTE — Telephone Encounter (Signed)
PER Pharmacy, Deborah [name] is a 54 year old female has requested a refill of     -  DICLOFENAC       Last Office Visit: 12/11/2019 with PCP  Last Physical Exam: 03/13/2017    PAP SMEAR due on 06/30/2019  HPV SCREENING due on 06/30/2019    Other Med Adult:  Most Recent BP Reading(s)  01/06/20 : 125/80        Cholesterol (mg/dL)   Date Value   02/17/2019 187     CALCULI ANALYSIS CHOLESTEROL (no units)   Date Value   11/12/2019 TNP     LOW DENSITY LIPOPROTEIN DIRECT (mg/dL)   Date Value   02/17/2019 121     HIGH DENSITY LIPOPROTEIN (mg/dL)   Date Value   02/17/2019 44     TRIGLYCERIDES (mg/dL)   Date Value   02/17/2019 197 (H)         THYROID SCREEN TSH REFLEX FT4 (uIU/mL)   Date Value   02/17/2019 1.793         TSH (THYROID STIM HORMONE) (uIU/mL)   Date Value   12/22/2010 1.31       HEMOGLOBIN A1C (%)   Date Value   02/17/2019 5.0       No results found for: POCA1C      INR (no units)   Date Value   06/15/2008 1.0 (L)       SODIUM (mmol/L)   Date Value   12/11/2019 140       POTASSIUM (mmol/L)   Date Value   12/11/2019 4.0           CREATININE (mg/dL)   Date Value   12/11/2019 1.2       Documented patient preferred pharmacies:    [facility], [place].  Phone: [phone] Fax: [phone]

## 2020-03-29 ENCOUNTER — Ambulatory Visit (HOSPITAL_BASED_OUTPATIENT_CLINIC_OR_DEPARTMENT_OTHER): Payer: Self-pay

## 2020-04-12 ENCOUNTER — Ambulatory Visit (HOSPITAL_BASED_OUTPATIENT_CLINIC_OR_DEPARTMENT_OTHER): Payer: Self-pay | Admitting: Internal Medicine

## 2020-04-13 ENCOUNTER — Encounter (HOSPITAL_BASED_OUTPATIENT_CLINIC_OR_DEPARTMENT_OTHER): Payer: Self-pay | Admitting: Internal Medicine

## 2020-04-13 ENCOUNTER — Ambulatory Visit: Payer: No Typology Code available for payment source | Attending: Internal Medicine | Admitting: Internal Medicine

## 2020-04-13 ENCOUNTER — Other Ambulatory Visit: Payer: Self-pay

## 2020-04-13 VITALS — BP 131/77 | HR 60 | Resp 18 | Ht 61.0 in | Wt 178.0 lb

## 2020-04-13 DIAGNOSIS — M1812 Unilateral primary osteoarthritis of first carpometacarpal joint, left hand: Secondary | ICD-10-CM | POA: Diagnosis present

## 2020-04-13 DIAGNOSIS — M2550 Pain in unspecified joint: Secondary | ICD-10-CM | POA: Diagnosis not present

## 2020-04-13 DIAGNOSIS — N289 Disorder of kidney and ureter, unspecified: Secondary | ICD-10-CM | POA: Insufficient documentation

## 2020-04-13 DIAGNOSIS — K219 Gastro-esophageal reflux disease without esophagitis: Secondary | ICD-10-CM | POA: Insufficient documentation

## 2020-04-13 DIAGNOSIS — M65331 Trigger finger, right middle finger: Secondary | ICD-10-CM | POA: Diagnosis not present

## 2020-04-13 MED ORDER — TROLAMINE SALICYLATE 10 % EX CREA
TOPICAL_CREAM | Freq: Two times a day (BID) | CUTANEOUS | 11 refills | Status: AC
Start: 2020-04-13 — End: 2021-04-13

## 2020-04-13 NOTE — Progress Notes (Signed)
[facility]  Rheumatology New Patient Note    Date of visit: 04/14/19  Patient: Deborah [name]  Date of birth: 1965/11/02  Age: 55 year old  Gender as per [place]  PCP: Helane Gunther, APRN  Primary language: [place]    Reason for referral: This is a 55 yo female- Diffuse body pains that date back to 2019. Polyarthralgia thought to be related to mechanical causes. Bilateral knee OA, Right shoulder OA, R lateral epicondylitis, RF 1:2. She has an allergy to NSAIDs.    CHIEF COMPLAINT: Arthralgia    Transfer of care.    HISTORY OF PRESENT ILLNESS:    I had the pleasure of meeting Deborah [name] who is a 55 year old [place] speaking female who has a osteoarthritis (knee, lumbar, hand), elbow tendinopathy, carpal tunnel syndrome, right third trigger finger (referred to hand surgery 02/2019 after receiving 2 glucocorticoid injections, both effective, but with recurrence of symptoms.  Also, referred to hand surgery for first [facility] osteoarthritis.    CCP and rheumatoid factor negative.    05/2017: Given right third trigger finger injection  05/2017: Given right shoulder subacromial bursal glucocorticoid injection.  10/2018: Right third trigger finger injection  03/2019: Evaluated by hand surgery.  Given a glucocorticoid injection for left thumb basal joint.  Recommended firm thumb splint that the patient wanted to obtain online.  Trigger finger was not addressed at that time.    Previously, followed by orthopedics for chronic knee pain/osteoarthritis.    Possible angioedema from oral NSAIDs (lip swelling with naproxen and ibuprofen in past, used since then and was okay.).  Tolerates topical NSAIDs.  Topical diclofenac is not very effective.  Arnica stopped working.  Acetaminophen helps. Dorflex from [place] helps with all of her pain but thumb pain remains.  Works in Development worker, community.    Swelling at base of thumbs. No  other swelling.    Triggering of right 3rd finger.    Intermittent pain in the knees and shoulders.  Tingling in the fingers at night.  She is wearing wrist once at night.    PAST MEDICAL HISTORY:  MSK:  Chondromalacia patella  Bilateral knee osteoarthritis  Left-sided plantar fasciitis  Chronic low back pain  Right-sided sciatica  Bilateral carpal tunnel syndrome  De Quervain's tenosynovitis on the right  Bilateral trochanteric bursitis requiring glucocorticoid injections  Right-sided lateral epicondylitis  Right-sided rotator cuff tendinopathy  Right third trigger finger requiring injection 2009 2010  For [facility] OA    Other:  Obesity  GERD-esophagitis on EGD 2014  History of H. pylori-treated 2008  [place]  Microscopic hematuria  Renal stone  Macular chorioretinal scar on the left  Breast lump s/p bx  Low vitamin D  History of positive occult stool blood test  History of tobacco use  Breast lump  Covid infection 06/2019  Nocturnal palpitations  SVT 06/2008  Sweating  Possible angioedema from NSAIDs    PAST SURGICAL HISTORY:  Past Surgical History:  11/12/2019: ------------OTHER-------------; Left      Comment:  Left ureteroscopy and laser lithotripsy, ureteral stent                placement, and retrograde pyelography  No date: CESAREAN DELIVERY ONLY      Comment:  breech  No date: [facility] BREAST SPECIMEN      Comment:  s/p fibroadenoma on the L,   No date: SEPTOPLASTY/SUBMUCOUS RESECJ W/WO CARTILAGE GRF    Hand dominance:  Right-hand-dominant    ALLERGIES:  Review of Patient's Allergies indicates:   Naproxen                Swelling    Comment:Ed visit with angioedema   Ibuprofen               Other (See Comments)    Comment:Epigastric pain, throat swelling?             05/06/12 pt states uses sometimes without issue   Morphine                Other (See Comments)    Comment:"THE LAST TIME I WAS HERE, THEY GAVE IT TO ME AND             TOLD ME TO TELL THE DOCTOR NOT TO GIVE IT TO ME             ANYMORE. IT MADE ME ACT  DIFFERENT".   Pollen extract-tree*    Runny Nose    Comment:HA, itchy watery eyes    MEDICATIONS:  Current Outpatient Medications   Medication Sig    diclofenac (VOLTAREN) 1 % GEL Gel APPLY TO BOTH HANDS, BOTH ELBOWS AND BOTH KNEES UP TO 4 TIMES A DAY. DO NOT EXCEED 32 GRAMS IN 24 HOURS    metoprolol (TOPROL-XL) 100 MG 24 hr tablet TAKE 1 TABLET BY MOUTH DAILY    CALCIUM + VITAMIN D3 600-5 MG-MCG TABS Tablet TAKE 1 TABLET BY MOUTH TWO TIMES DAILY    Cholecalciferol (VITAMIN D3) 50 MCG (2000 UT) TABS TAKE 1 TABLET BY MOUTH DAILY    omeprazole (PRILOSEC) 20 MG capsule Take 1 capsule by mouth 2 (two) times daily before meals     No current facility-administered medications for this visit.        SOCIAL HISTORY:  Social History     Socioeconomic History    Marital status: Divorced     Spouse name: Not on file    Number of children: 1    Years of education: Not on file    Highest education level: Not on file   Occupational History    Occupation: housecleaning     Employer: [employer]   Tobacco Use    Smoking status: Former Smoker     Packs/day: 0.50     Years: 27.00     Pack years: 13.50     Types: Cigarettes     Quit date: 06/28/2002     Years since quitting: 17.8    Smokeless tobacco: Never Used   Substance and Sexual Activity    Alcohol use: No     Alcohol/week: 0.0 standard drinks    Drug use: No    Sexual activity: Not Currently     Partners: Male     Comment: no STI hx; HIV neg 2002   Other Topics Concern    Military Service Not Asked    Blood Transfusions Not Asked    Caffeine Concern Not Asked    Occupational Exposure Not Asked    Hobby Hazards Not Asked    Sleep Concern Not Asked    Stress Concern Yes    Weight Concern Yes    Special Diet Yes    Back Care Not Asked    Exercise No    Bike Helmet Not Asked    Seat Belt Yes    Self-Exams Yes   Social History Narrative    [place], [place]. To [place] 2000.    Lives with her  son and her parents. Divorced from husband.    Denies DV. No guns in  home.        No regular exercise. Dental care in place.   Social Determinants of Health  Financial Resource Strain:       Difficulty of Paying Living Expenses: Not on file  Food Insecurity:       Worried About Charity fundraiser in the Last Year: Not on file      [employer] of Food in the Last Year: Not on file  Transportation Needs:       Lack of Transportation (Medical): Not on file      Lack of Transportation (Non-Medical): Not on file  Physical Activity:       Days of Exercise per Week: Not on file      Minutes of Exercise per Session: Not on file  Stress:       Feeling of Stress : Not on file  Social Connections:       Frequency of Communication with Friends and Family: Not on file      Frequency of Social Gatherings with Friends and Family: Not on file      Attends Religious Services: Not on file      Active Member of Clubs or Organizations: Not on file      Attends Archivist Meetings: Not on file      Marital Status: Not on file  Intimate Partner Violence:       Fear of Current or Ex-Partner: Not on file      Emotionally Abused: Not on file      Physically Abused: Not on file      Sexually Abused: Not on file    Habits: No tobacco.  No alcohol.    Occupation: Housecleaning    Marital Status: Divorced    Lives with: Parents and her teenage son    Country of birth: [place]    GYNECOLOGIC HISTORY:  G 1 P 1  Menarche at age 62.  Last menses was around age of 89 2017 years ago)    FAMILY HISTORY:  Adopted-no    Review of patient's family history indicates:  Problem: Heart      Relation: Father          Age of Onset: (Not Specified)          Comment: arrhythmia  Problem: Hypertension      Relation: Mother          Age of Onset: (Not Specified)  Problem: Hypertension      Relation: Father          Age of Onset: (Not Specified)  Problem: Diabetes      Relation: Mother          Age of Onset: (Not Specified)  Problem: Diabetes      Relation: Maternal Uncle          Age of Onset: (Not Specified)  Problem:  Diabetes      Relation: Maternal Uncle          Age of Onset: (Not Specified)  Problem: Glaucoma      Relation: Maternal Uncle          Age of Onset: (Not Specified)  Problem: Heart      Relation: Maternal Grandmother          Age of Onset: (Not Specified)  Problem: Heart      Relation: Maternal Uncle  Age of Onset: (Not Specified)  Problem: Heart      Relation: Maternal Uncle          Age of Onset: (Not Specified)  Problem: Cancer - Other      Relation: FamHxNeg          Age of Onset: (Not Specified)  Problem: Cancer - Breast      Relation: FamHxNeg          Age of Onset: (Not Specified)  Problem: Cancer - Colon      Relation: FamHxNeg          Age of Onset: (Not Specified)  Problem: Cancer - Lung      Relation: FamHxNeg          Age of Onset: (Not Specified)  Problem: Cancer - Ovarian      Relation: FamHxNeg          Age of Onset: (Not Specified)    Pertinent rheumatic family history:  She reports a family history of rheumatoid arthritis, osteoarthritis, psoriatic arthritis, thyroid disease and osteoporosis.    ROS:  Constitutional, Eyes, ENT/Mouth, Cardiovascular, Respiratory, GI, GU, Neuro, Psych, Heme/Lymph, Skin, Musculoskeletal, and Endocrine systems were reviewed and are NEGATIVE, except for what is documented in the note.    The patient filled out a new patient intake form to the best of their ability that includes PMHx, PSHx, family Hx, sexual Hx, gynecological Hx (for women), social history (includes habits, sleep, occupation, and hand dominance), health maintenance questions and a full review of systems.  This form has been scanned into the electronic medical record.  Please refer to it in conjunction with the new patient evaluation office visit note.    PE:  .   04/13/20  0907   BP: 131/77   Site: Right Arm   Position: Sitting   Cuff Size: Regular   Pulse: 60   Resp: 18   SpO2: 99%   Weight: 80.7 kg (178 lb)   Height: 5\' 1"  (1.549 m)     Body mass index is 33.63 kg/m.    GENERAL: NAD.  Alert  and oriented. Good eye contact. Full affect. Follows commands appropriately.  Well-groomed.  HEENT: Anicteric.  No scleral injection.  Atraumatic/normocephalic.  No scalp tenderness.    LUNGS: Clear to auscultation without wheezes, rhonchi or rales.  EXTREMITIES: Well perfused. No edema. No clubbing.  SKIN: No rashes, nodules, bruising  MUSCULOSKELETAL:   Mild squaring of the left thumb.  Tenderness at the base of the left thumb.  Right fifth Heberden node.  Triggering right third finger.  No restriction range of motion at the wrists elbows or shoulders.  No knee effusions.  No ankle swelling.  No dactylitis.  Mild toe deformities.  NEURO:    No weakness.   No difficulty getting up from seated position without using arms for assistance or out of a supine position.    No assistive ambulatory devices.    LABORATORY DATA that I have personally reviewed and interpreted:  Creatinine 1.2, GFR 51  WBC 9.1 hematocrit 36.6, platelets 217    IMAGING AND OTHER STUDIES that I personally reviewed and interpreted:    Lumbar x-ray 06/2019: Degenerative changes T12-L1, L5-S1, facet hypertrophy  Hand x-rays: 02/2019: Degenerative changes  Knee x-rays 12/2017: Osteoarthritic changes    MRI lumbar spine 09/2008:   Findings: The alignment of the lumbar spine is normal. There is    intervertebral disc space narrowing at multiple levels. Schmorl's node  is seen at the inferior endplate of T12 vertebral body and superior    endplate of L2 vertebral body. Loss of signal is present in the discs    especially at T12-L1 and L1-L2 levels. No fracture or destructive    lesion is visible in the vertebral bodies. The conus located at T12-L1    level. No mass or compression is noted in the periconal region.      T12-L1: There is a small circumferential disc bulge. No evidence of    central canal stenosis or neural foraminal encroachment.    L1-L2: Small central disc bulge without central canal narrowing or    neural foramen encroachment.    L2-L3:  Normal findings    L3-L4: Mild central disc bulge without central canal narrowing or    neural foraminal encroachment.    L4-L5: Broad-based central left paracentral disc bulge extending into    the left neural foramen. There is moderate left neural foraminal    encroachment. Right neural foramen is patent. No significant central    canal narrowing. Facet joints and ligamentum flavum is normal.    L5-S1: Small central disc bulge without neural foraminal encroachment    or central canal stenosis. There are hypertrophic facet changes    bilaterally at this level.      Retroperitoneal structures are normal. Visualized kidneys are normal.    No adenopathy is seen.      Impression:    1. Multilevel degenerative disc disease as described above. No    significant central canal narrowing or neural foraminal encroachment    noted.     Right shoulder x-ray 10/2007: No changes identified    ASSESSMENT:  Deborah [name] is a 55 year old [place] speaking female with mild renal insufficiency, GERD, symptoms suggestive of carpal tunnel syndrome, hand osteoarthritis, intermittent tendinopathy of the shoulders/elbows, chronic back pain and knee OA.  Based on history, physical examination and data, it does not appear that she has an inflammatory arthritis.  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    Despite having lip swelling from ibuprofen to naproxen in the remote past, she tolerates Advil 400 mg.  She also takes an anti-inflammatory medication periodically from [place].    PLAN/RECOMMENDATIONS:  We discussed that it does not appear that she has an inflammatory arthritis such as rheumatoid arthritis.    First [facility] OA: Referred her back to hand surgery to discuss repeating glucocorticoid injections versus surgery.  Continue thumb splint.    Right third trigger finger: Given glucocorticoid injection.  Dr. Jacqulynn Cadet performed the procedure.  No  complications.  Please see procedure note below.    Carpal tunnel syndrome: Recommend cock-up splints.  Recommend discussing with hand surgery.  Recommend ibuprofen.    Pain management: Ibuprofen 400 mg up to 3 times daily (lower dose given GERD and renal insufficiency), as needed and/or acetaminophen up to 1000 mg up to 3 times daily, as needed.  Trial of topical lidocaine.  Trial of topical trolamine salicylate.  Discussed potential positive effects of turmeric and omega-3 fatty acids.    Recommend physical therapy, as needed for acute exacerbations of tendinitis/tendinopathy.    ------------------------------------------------------------------------------  PROCEDURE NOTE:  Procedure: Right third trigger finger injection  Indication: Right third trigger finger  Side: Right    Patient gave a verbal informed consent for cortisone injection after discussing potential benefits and the risks of injections, including, but not  limited to, infection, allergic reaction, bleeding, pain, nerve injury, paresis, tissue atrophy/local tissue breakdown, change in skin pigmentation and ineffectiveness and transient elevation in glucose level.  Alternative therapies were discussed.     TIMEOUT: PATIENT/PROCEDURE VERIFICATION DOCUMENTATION  Correct patient: Yes  Correct procedure: Yes  Correct site, mark visible if applicable: Yes    Pre-procedure Vital Signs:  Refer to vitals section of visit     Sterile technique was followed throughout the procedure.    The patient was sterilely prepped with chlorhexadine.  A topical skin refrigerant was used to numb the area.  20 mg of Depo-medrol and 0.2 cc of 1% lidocaine was injected into the tenosynovial space of the right third flexor tendon in the hand after negative aspiration.   The patient tolerated the procedure without complications.  Post procedure care was discussed.  The patient will call with any issues.    MEDICATION DOSE VOLUME   Xylocaine 1% - epinephrine free      mg  0.2    ml   Bupivicaine 0.5% - epinephrine free      mg      ml   Depo-medrol 20mg /ml      mg      ml   Depo-medrol 40mg /ml      mg      ml   Depo-medrol 80mg /ml      mg    0.25 ml   Kenalog 40mg /ml      mg      ml   Dexamethasone 10mg /ml      mg      ml   Other:       mg      ml   ------------------------------------------------------------------------------    The patient was given an end of visit summary upon checkout that included an updated medication list.      The patient's questions have been addressed and answered. The patient verbalized an understanding and agreement with this plan, as outlined above.    Follow-up, as needed    The majority of this rheumatology note was created using voice recognition software.  There is potential for wrong word or sound like substitutions due to the inherent limitations of voice recognition software.  Please read the chart carefully and recognize, using context, where substitutions have occurred.  Please excuse any identified errors in spelling or syntax.  Every effort has been made to appropriately correct upon completion of the note.        Thank you for your kind referral.  I appreciate the opportunity to participate in [place] care.  Please contact me with any questions that you may have.     I spent a total of 60 minutes on this visit on the date of service (total time includes all activities performed on the date of service).    Vinnie Level L. [name], MD, MPH, Julious Payer

## 2020-04-13 NOTE — Patient Instructions (Addendum)
ADVIL: 400MG = 2 PILLS- 3 X DAILY AS NEEDED.    TYLENOL UP TO 1000MG  AT A TIME, UP TO 3X DAILY.    TROLAMINE SALICYLATE= ASPERCREME    Lidocaine ointment- wear gloves    Paraffin wax bath    Turmeric  Omega 3 fatty acids- fish oil, flax seed oil, chia

## 2020-05-25 ENCOUNTER — Other Ambulatory Visit: Payer: Self-pay

## 2020-05-25 ENCOUNTER — Encounter (HOSPITAL_BASED_OUTPATIENT_CLINIC_OR_DEPARTMENT_OTHER): Payer: Self-pay

## 2020-05-25 ENCOUNTER — Ambulatory Visit
Admission: RE | Admit: 2020-05-25 | Discharge: 2020-05-25 | Disposition: A | Discharge status: Home / Self Care | Payer: No Typology Code available for payment source | Attending: Family Medicine | Admitting: Family Medicine

## 2020-05-25 DIAGNOSIS — Z8041 Family history of malignant neoplasm of ovary: Secondary | ICD-10-CM

## 2020-05-25 DIAGNOSIS — Z1231 Encounter for screening mammogram for malignant neoplasm of breast: Secondary | ICD-10-CM | POA: Insufficient documentation

## 2020-06-15 ENCOUNTER — Other Ambulatory Visit (HOSPITAL_BASED_OUTPATIENT_CLINIC_OR_DEPARTMENT_OTHER): Payer: Self-pay | Admitting: Surgical

## 2020-06-15 NOTE — Telephone Encounter (Signed)
PER Pharmacy, Deborah [name] is a 55 year old female has requested a refill of    Tylenol       Last Office Visit: 12/11/2019    With Gwendolyn Grant, APRN    Last Physical Exam:03/13/2017    PAP SMEAR due on 06/30/2019  HPV SCREENING due on 06/30/2019    Other Med Adult:  Most Recent BP Reading(s)  04/13/20 : 131/77        Cholesterol (mg/dL)   Date Value   02/17/2019 187     CALCULI ANALYSIS CHOLESTEROL (no units)   Date Value   11/12/2019 TNP     LOW DENSITY LIPOPROTEIN DIRECT (mg/dL)   Date Value   02/17/2019 121     HIGH DENSITY LIPOPROTEIN (mg/dL)   Date Value   02/17/2019 44     TRIGLYCERIDES (mg/dL)   Date Value   02/17/2019 197 (H)         THYROID SCREEN TSH REFLEX FT4 (uIU/mL)   Date Value   02/17/2019 1.793         TSH (THYROID STIM HORMONE) (uIU/mL)   Date Value   12/22/2010 1.31       HEMOGLOBIN A1C (%)   Date Value   02/17/2019 5.0       No results found for: POCA1C      INR (no units)   Date Value   06/15/2008 1.0 (L)       SODIUM (mmol/L)   Date Value   12/11/2019 140       POTASSIUM (mmol/L)   Date Value   12/11/2019 4.0           CREATININE (mg/dL)   Date Value   12/11/2019 1.2       Documented patient preferred pharmacies:    [place], [place] - [place].  Phone: [phone] Fax: [phone]

## 2020-07-02 ENCOUNTER — Other Ambulatory Visit (HOSPITAL_BASED_OUTPATIENT_CLINIC_OR_DEPARTMENT_OTHER): Payer: Self-pay | Admitting: Family Medicine

## 2020-07-02 NOTE — Telephone Encounter (Signed)
PER Pharmacy, Deborah [name] is a 55 year old female has requested a refill of omeprazole 20.      Last Office Visit: 12-11-19 with pcp  Last Physical Exam: 03-13-17    PAP SMEAR due on 06/30/2019  HPV SCREENING due on 06/30/2019    Other Med Adult:  Most Recent BP Reading(s)  04/13/20 : 131/77        Cholesterol (mg/dL)   Date Value   02/17/2019 187     CALCULI ANALYSIS CHOLESTEROL (no units)   Date Value   11/12/2019 TNP     LOW DENSITY LIPOPROTEIN DIRECT (mg/dL)   Date Value   02/17/2019 121     HIGH DENSITY LIPOPROTEIN (mg/dL)   Date Value   02/17/2019 44     TRIGLYCERIDES (mg/dL)   Date Value   02/17/2019 197 (H)         THYROID SCREEN TSH REFLEX FT4 (uIU/mL)   Date Value   02/17/2019 1.793         TSH (THYROID STIM HORMONE) (uIU/mL)   Date Value   12/22/2010 1.31       HEMOGLOBIN A1C (%)   Date Value   02/17/2019 5.0       No results found for: POCA1C      INR (no units)   Date Value   06/15/2008 1.0 (L)       SODIUM (mmol/L)   Date Value   12/11/2019 140       POTASSIUM (mmol/L)   Date Value   12/11/2019 4.0           CREATININE (mg/dL)   Date Value   12/11/2019 1.2       Documented patient preferred pharmacies:    [place], [place] - [place].  Phone: [phone] Fax: [phone]

## 2020-07-06 ENCOUNTER — Ambulatory Visit (HOSPITAL_BASED_OUTPATIENT_CLINIC_OR_DEPARTMENT_OTHER): Payer: No Typology Code available for payment source | Admitting: Clinical Cardiac Electrophysiology

## 2020-07-06 ENCOUNTER — Other Ambulatory Visit: Payer: Self-pay

## 2020-07-06 ENCOUNTER — Ambulatory Visit (HOSPITAL_BASED_OUTPATIENT_CLINIC_OR_DEPARTMENT_OTHER)
Admit: 2020-07-06 | Discharge: 2020-07-06 | Disposition: A | Discharge status: Home / Self Care | Payer: No Typology Code available for payment source

## 2020-07-06 ENCOUNTER — Telehealth (HOSPITAL_BASED_OUTPATIENT_CLINIC_OR_DEPARTMENT_OTHER): Payer: Self-pay | Admitting: General Practice

## 2020-07-06 ENCOUNTER — Ambulatory Visit
Admission: RE | Admit: 2020-07-06 | Discharge: 2020-07-06 | Disposition: A | Discharge status: Home / Self Care | Payer: No Typology Code available for payment source | Attending: Clinical Cardiac Electrophysiology | Admitting: Clinical Cardiac Electrophysiology

## 2020-07-06 VITALS — BP 118/78 | HR 75 | Temp 96.5°F | Wt 186.0 lb

## 2020-07-06 DIAGNOSIS — I471 Supraventricular tachycardia, unspecified: Secondary | ICD-10-CM

## 2020-07-06 DIAGNOSIS — R0600 Dyspnea, unspecified: Secondary | ICD-10-CM | POA: Diagnosis present

## 2020-07-06 DIAGNOSIS — R0609 Other forms of dyspnea: Secondary | ICD-10-CM

## 2020-07-06 LAB — CBC, PLATELET & DIFFERENTIAL
ABSOLUTE BASO COUNT: 0 10*3/uL (ref 0.0–0.1)
ABSOLUTE EOSINOPHIL COUNT: 0.1 10*3/uL (ref 0.0–0.8)
ABSOLUTE IMM GRAN COUNT: 0.02 10*3/uL (ref 0.00–0.03)
ABSOLUTE LYMPH COUNT: 2 10*3/uL (ref 0.6–5.9)
ABSOLUTE MONO COUNT: 0.6 10*3/uL (ref 0.2–1.4)
ABSOLUTE NEUTROPHIL COUNT: 4.6 10*3/uL (ref 1.6–8.3)
ABSOLUTE NRBC COUNT: 0 10*3/uL (ref 0.0–0.0)
BASOPHIL %: 0.4 % (ref 0.0–1.2)
EOSINOPHIL %: 1 % (ref 0.0–7.0)
HEMATOCRIT: 38 % (ref 34.1–44.9)
HEMOGLOBIN: 12.7 g/dL (ref 11.2–15.7)
IMMATURE GRANULOCYTE %: 0.3 % (ref 0.0–0.4)
LYMPHOCYTE %: 27.2 % (ref 15.0–54.0)
MEAN CORP HGB CONC: 33.4 g/dL (ref 31.0–37.0)
MEAN CORPUSCULAR HGB: 28.9 pg (ref 26.0–34.0)
MEAN CORPUSCULAR VOL: 86.6 fl (ref 80.0–100.0)
MEAN PLATELET VOLUME: 9.4 fL (ref 8.7–12.5)
MONOCYTE %: 8.5 % (ref 4.0–13.0)
NEUTROPHIL %: 62.6 % (ref 40.0–75.0)
NRBC %: 0 % (ref 0.0–0.0)
PLATELET COUNT: 194 10*3/uL (ref 150–400)
RBC DISTRIBUTION WIDTH STD DEV: 38.7 fL (ref 35.1–46.3)
RED BLOOD CELL COUNT: 4.39 M/uL (ref 3.90–5.20)
WHITE BLOOD CELL COUNT: 7.3 10*3/uL (ref 4.0–11.0)

## 2020-07-06 LAB — COMPREHENSIVE METABOLIC PANEL
ALANINE AMINOTRANSFERASE: 27 U/L (ref 12–45)
ALBUMIN: 3.9 g/dL (ref 3.4–5.0)
ALKALINE PHOSPHATASE: 118 U/L — ABNORMAL HIGH (ref 45–117)
ANION GAP: 10 mmol/L (ref 5–15)
ASPARTATE AMINOTRANSFERASE: 16 U/L (ref 8–34)
BILIRUBIN TOTAL: 0.3 mg/dL (ref 0.2–1.0)
BUN (UREA NITROGEN): 17 mg/dL (ref 7–18)
CALCIUM: 9.1 mg/dL (ref 8.5–10.1)
CARBON DIOXIDE: 27 mmol/L (ref 21–32)
CHLORIDE: 106 mmol/L (ref 98–107)
CREATININE: 0.7 mg/dL (ref 0.4–1.2)
ESTIMATED GLOMERULAR FILT RATE: >60 mL/min (ref >60)
Glucose Random: 91 mg/dL (ref 74–160)
POTASSIUM: 4.4 mmol/L (ref 3.5–5.1)
SODIUM: 143 mmol/L (ref 136–145)
TOTAL PROTEIN: 7.6 g/dL (ref 6.4–8.2)

## 2020-07-06 LAB — D-DIMER PE/DVT, QUANTITATIVE: D-DIMER PE/DVT, QUANTITATIVE: 338 ng/mLFEU (ref 0.00–499)

## 2020-07-06 NOTE — Telephone Encounter (Signed)
Call placed to pt via [place] interpreter. No answer. Message left to return call to clinic. Return phone number provided.       Message  Received: Today  [name], MD  P [facility] Nurses Pool; Gwendolyn Grant, APRN  Dear nursing,   Please let her know that her blood work looks good. No explanation found for SOB. No evidence of blood clot or anemia or any issues on the labs to explain her breathing difficulties. CxR is also OK. Will await the results of the echo and stress echo. If OK, then she should F/u with her PCP if the symptoms persist and would encourage continued efforts in weight loss. Thanks. [place] - full note to follow. She complained of DOE over the past 3 weeks. Labs are OK (alk Phos is minimally elevated but has been on and off - no explanation for DOE on labs). CxR was fine. She appears euvolemic on exam. I ordered an echo and stress echo. If cardiac testing is OK, would you please F/u with her for this issue? It could be related to weight and deconditioning. Thanks. [name]

## 2020-07-06 NOTE — Telephone Encounter (Signed)
-----  Message from [name], MD sent at 07/06/2020  1:13 PM EDT -----  Dear nursing,  Please let her know that her blood work looks good. No explanation found for SOB. No evidence of blood clot or anemia or any issues on the labs to explain her breathing difficulties. CxR is also OK. Will await the results of the echo and stress echo. If OK, then she should F/u with her PCP if the symptoms persist and would encourage continued efforts in weight loss. Thanks. [place] - full note to follow. She complained of DOE over the past 3 weeks. Labs are OK (alk Phos is minimally elevated but has been on and off - no explanation for DOE on labs). CxR was fine. She appears euvolemic on exam. I ordered an echo and stress echo. If cardiac testing is OK, would you please F/u with her for this issue? It could be related to weight and deconditioning. Thanks. [name]

## 2020-07-06 NOTE — Progress Notes (Signed)
OUTPATIENT ELECTROPHYSIOLOGY PROGRESS NOTE  Date of visit: 07/06/2020    Patient returns for F/u of SVT.    In summary patient is 55 year old female with the following medical problems:  DOE (dyspnea on exertion)  (primary encounter diagnosis)  SVT To 240 bpm (diagnosed March 2010); on Toprol XL; recurrent symptoms relieved w/ vagal maneuvers    Patient was last seen in [facility] Cardiology on 01/06/2020.    Over the 3 past weeks, she reports increased DOE increased. She also reports DOE after climbing 2-3 FOS which causes her to feel very SOB. No CP. No significant palpitations. No orthopnea or PND. No edema.    She has gained 6-8 lbs since Feb 2022 although weight is about the same now as it was in Aug 2021; she had lost some weight but gained it back.    Current Medications:  omeprazole (PRILOSEC) 20 MG capsule, TAKE 1 CAPSULE BY MOUTH 2 (TWO) TIMES DAILY BEFORE MEALS, Disp: 60 capsule, Rfl: 0  trolamine salicylate (ASPERCREME) 10 % cream, Apply topically 2 (two) times daily, Disp: 85 g, Rfl: 11  diclofenac (VOLTAREN) 1 % GEL Gel, APPLY TO BOTH HANDS, BOTH ELBOWS AND BOTH KNEES UP TO 4 TIMES A DAY. DO NOT EXCEED 32 GRAMS IN 24 HOURS, Disp: 100 g, Rfl: 3  metoprolol (TOPROL-XL) 100 MG 24 hr tablet, TAKE 1 TABLET BY MOUTH DAILY, Disp: 90 tablet, Rfl: 3  CALCIUM + VITAMIN D3 600-5 MG-MCG TABS Tablet, TAKE 1 TABLET BY MOUTH TWO TIMES DAILY, Disp: 60 tablet, Rfl: 10  Cholecalciferol (VITAMIN D3) 50 MCG (2000 UT) TABS, TAKE 1 TABLET BY MOUTH DAILY, Disp: 30 tablet, Rfl: 11    No current facility-administered medications on file prior to visit.      Review of Patient's Allergies indicates:   Naproxen                Swelling    Comment:Ed visit with angioedema   Ibuprofen               Other (See Comments)    Comment:Epigastric pain, throat swelling?             05/06/12 pt states uses sometimes without issue   Morphine                Other (See Comments)    Comment:"THE LAST TIME I WAS HERE, THEY GAVE IT TO ME AND              TOLD ME TO TELL THE DOCTOR NOT TO GIVE IT TO ME             ANYMORE. IT MADE ME ACT DIFFERENT".   Pollen extract-tree*    Runny Nose    Comment:HA, itchy watery eyes    FAMILY HISTORY: Father has AFib. Mother is diabetes and has HTN.    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 Lives with son (10 yrs), father and mother. No cigs, EtOH, or drugs.     ROS: All other systems were pertinently reviewed and were negative unless otherwise outlined above.    PHYSICAL EXAM:  General: Overweight; normal appearance for age; no apparent distress   07/06/20  0830   BP: 118/78   Site: Left Arm   Position: Sitting   Cuff Size: Large   Pulse: 75   Temp: 96.5 F (35.8 C)   TempSrc: Temporal   SpO2: 100%   Weight: 84.4  kg (186 lb)     HEENT: Oral mucosa is moist, no icterus, no pallor noted.  NECK: Supple. No thyromegaly.  CHEST: Clear to auscultation, no crackles or rhonchi. No wheezes.  HEART: Normal JVP. Carotid pulse are +2 bilaterally with normal upstrokes and no bruits. Regular rate and rhythm. No murmurs, rubs, or gallops.   ABDOMEN: Soft, no organomegaly detected. No abdominal bruits.  EXTREMITIES: Warm and well perfused. Peripheral pulses are +2 bilaterally in both upper and lower extremities. There is no evidence of peripheral pedal edema.    PSYCHIATRIC: Mood and affect are appropriate.  SKIN: No rash observed.    PERTINENT INVESTIGATIONS:  1. EKG today (on my personal review): Sinus rhythm at 68 bpm. .144/.86/.427. QRS axis 45. Normal tracing. Compared to the ECG from 07/11/2019, no significant change.  2. LABS:   Lab Results   Component Value Date    NA 143 07/06/2020    K 4.4 07/06/2020    CL 106 07/06/2020    CO2 27 07/06/2020    BUN 17 07/06/2020    CREAT 0.7 07/06/2020    GLUCOSER 91 07/06/2020     Lab Results   Component Value Date    LDL 121 02/17/2019    HDL 44 02/17/2019    TG 197 (H) 02/17/2019     ECHO 02/22/2016:  1. LV ejection fraction is 60%.    2. There  are no regional wall motion abnormalities.    3. Normal study.    4. Compared to the previous study dated 04/18/11, there is no significant change.    30-Day Event Monitor Aug/Sept 2021:  Baseline rhythm at sinus at 76 bpm. There were no symptomatic recordings. There were 8 auto-triggered recordings which showed sinus at 47-138 bpm with no ectopy.      ASSESSMENT:  55 year old woman with structurally normal heart on echo and normal baseline ECG who presented with her first episode of rapid SVT in March 2010. She is treated with Toprol XL and has frequent recurrent but short lived symptoms although followed by several hours of fatigue. She also reported intermittent spells of "low energy" separate from palpitations which have resolved since kidney stone issues have resolved.    She is doing well with no recurrent SVT on Toprol XL 100 mg daily and she wishes to continue medical Rx. She complains of increased DOE over the past 3 weeks; appears euvolemic on exam.    PLAN:  (R06.00) DOE (dyspnea on exertion) (primary encounter diagnosis)  Comment: Possible related to recent re-gaining of the weight she had lost.  Plan:  1. Will check a Chest X-ray today.  2. Blood work to exclude anemia; will also check D-Dimer and other labs.  3. Recheck Echocardiogram.  4. Will obtain Stress Echo.  5. If testing is unremarkable, symptoms may be due to weight and deconditioning - I asked her to decrease portions and F/u with her PCP.    (I47.1) SVT To 240 bpm (diagnosed March 2010); on Toprol XL; recurrent symptoms relieved w/ vagal maneuvers    Comment: Suspect AVNRT although I cannot retrieve her SVT ECG from March 2010.  Plan:   1. Continue Toprol XL 100 mg qPM.  2. If symptoms progress, will consider SVT ablation.      Return for follow up in Cardiology Clinic in 4 months. To contact earlier if there are any cardiac related issues.  I spent a total of 35 minutes on this visit on the date of service (  total time includes all  activities performed on the date of service).    This Cardiology Division patient encounter note was created using voice-recognition software and in real time during the clinic visit. Please excuse any typographical errors that have not been edited out.     Electronically signed by: [name], MD, 07/11/2020 1:55 PM

## 2020-07-06 NOTE — Progress Notes (Signed)
Dear nursing,  Please let her know that her blood work looks good. No explanation found for SOB. No evidence of blood clot or anemia or any issues on the labs to explain her breathing difficulties. CxR is also OK. Will await the results of the echo and stress echo. If OK, then she should F/u with her PCP if the symptoms persist and would encourage continued efforts in weight loss. Thanks. [place] - full note to follow. She complained of DOE over the past 3 weeks. Labs are OK (alk Phos is minimally elevated but has been on and off - no explanation for DOE on labs). CxR was fine. She appears euvolemic on exam. I ordered an echo and stress echo. If cardiac testing is OK, would you please F/u with her for this issue? It could be related to weight and deconditioning. Thanks. [name]

## 2020-07-08 NOTE — Telephone Encounter (Signed)
Please call pt. I sent in refills requested. Please schedule a follow up appointment.   Thanks,  [place]

## 2020-07-09 ENCOUNTER — Telehealth (HOSPITAL_BASED_OUTPATIENT_CLINIC_OR_DEPARTMENT_OTHER): Payer: Self-pay

## 2020-07-09 ENCOUNTER — Telehealth (HOSPITAL_BASED_OUTPATIENT_CLINIC_OR_DEPARTMENT_OTHER): Payer: Self-pay | Admitting: General Practice

## 2020-07-09 ENCOUNTER — Encounter (HOSPITAL_BASED_OUTPATIENT_CLINIC_OR_DEPARTMENT_OTHER): Payer: Self-pay | Admitting: Internal Medicine

## 2020-07-09 LAB — EKG

## 2020-07-09 NOTE — Telephone Encounter (Signed)
Lm on vm to call back and schedule an appt.  Dayna Ramus, 07/09/2020     Julaine Hua, MD  Ec Support Staff Pool  Please call pt. I sent in refills requested. Please schedule a follow up appointment.   Thanks,   [place]

## 2020-07-09 NOTE — Telephone Encounter (Signed)
Call placed to pt via [place] interpreter. Message from MD was read to pt. Pt agrees with plan and has no further questions.    Dr. Soundra Pilon- EMS nursing pool  Dear nursing,   Please let her know that her blood work looks good. No explanation found for SOB. No evidence of blood clot or anemia or any issues on the labs to explain her breathing difficulties. CxR is also OK. Will await the results of the echo and stress echo. If OK, then she should F/u with her PCP if the symptoms persist and would encourage continued efforts in weight loss. Thanks. [name]

## 2020-07-11 ENCOUNTER — Encounter (HOSPITAL_BASED_OUTPATIENT_CLINIC_OR_DEPARTMENT_OTHER): Payer: Self-pay | Admitting: Clinical Cardiac Electrophysiology

## 2020-07-12 ENCOUNTER — Other Ambulatory Visit (HOSPITAL_BASED_OUTPATIENT_CLINIC_OR_DEPARTMENT_OTHER): Payer: Self-pay

## 2020-07-13 ENCOUNTER — Other Ambulatory Visit: Payer: Self-pay

## 2020-07-13 ENCOUNTER — Ambulatory Visit
Admission: RE | Admit: 2020-07-13 | Discharge: 2020-07-13 | Disposition: A | Discharge status: Home / Self Care | Payer: No Typology Code available for payment source | Attending: Clinical Cardiac Electrophysiology | Admitting: Clinical Cardiac Electrophysiology

## 2020-07-13 ENCOUNTER — Telehealth (HOSPITAL_BASED_OUTPATIENT_CLINIC_OR_DEPARTMENT_OTHER): Payer: Self-pay | Admitting: General Practice

## 2020-07-13 DIAGNOSIS — R0600 Dyspnea, unspecified: Secondary | ICD-10-CM

## 2020-07-13 DIAGNOSIS — R0609 Other forms of dyspnea: Secondary | ICD-10-CM

## 2020-07-13 LAB — ECHOCARDIOGRAM W/ DOPPLER: LVEF: 60 %

## 2020-07-13 NOTE — Telephone Encounter (Signed)
Call placed to pt via [place] interpreter. Results of recent echocardiogram provided. Upcoming appointment for stress echocardiogram date and time confirmed with pt.       Message  Received: Today  [name], MD  P Ems Nurses Pool; Gwendolyn Grant, APRN  Dear nursing,   Please let her know that her echo looks great. Normal heart function and normal valve function. No cardiac explanation for SOB. Will wait for the repeat stress echo. If normal, I would have her F/u with her PCP for the SOB.   Thanks,   Lara Mulch - FYI.

## 2020-07-13 NOTE — Telephone Encounter (Signed)
-----   Message from [name], MD sent at 07/13/2020  2:39 PM EDT -----  Dear nursing,  Please let her know that her echo looks great. Normal heart function and normal valve function. No cardiac explanation for SOB. Will wait for the repeat stress echo. If normal, I would have her F/u with her PCP for the SOB.  Thanks,  Lara Mulch - FYI.

## 2020-07-28 ENCOUNTER — Other Ambulatory Visit (HOSPITAL_BASED_OUTPATIENT_CLINIC_OR_DEPARTMENT_OTHER): Payer: Self-pay

## 2020-08-06 ENCOUNTER — Other Ambulatory Visit (HOSPITAL_BASED_OUTPATIENT_CLINIC_OR_DEPARTMENT_OTHER): Payer: Self-pay

## 2020-08-09 ENCOUNTER — Ambulatory Visit
Admission: RE | Admit: 2020-08-09 | Discharge: 2020-08-09 | Disposition: A | Discharge status: Home / Self Care | Payer: No Typology Code available for payment source | Attending: Urology | Admitting: Urology

## 2020-08-09 ENCOUNTER — Other Ambulatory Visit: Payer: Self-pay

## 2020-08-09 DIAGNOSIS — Z87442 Personal history of urinary calculi: Secondary | ICD-10-CM

## 2020-08-17 ENCOUNTER — Ambulatory Visit: Payer: No Typology Code available for payment source | Attending: Specialist | Admitting: Specialist

## 2020-08-17 ENCOUNTER — Encounter (HOSPITAL_BASED_OUTPATIENT_CLINIC_OR_DEPARTMENT_OTHER): Payer: Self-pay | Admitting: Specialist

## 2020-08-17 ENCOUNTER — Other Ambulatory Visit: Payer: Self-pay

## 2020-08-17 ENCOUNTER — Other Ambulatory Visit (HOSPITAL_BASED_OUTPATIENT_CLINIC_OR_DEPARTMENT_OTHER): Payer: Self-pay

## 2020-08-17 DIAGNOSIS — N200 Calculus of kidney: Secondary | ICD-10-CM | POA: Diagnosis present

## 2020-08-17 LAB — POC URINALYSIS
BILIRUBIN, URINE: NEGATIVE
GLUCOSE,URINE: NEGATIVE
KETONE, URINE: NEGATIVE
LEUKOCYTE ESTERASE: NEGATIVE
NITRITE, URINE: NEGATIVE
PH URINE: 5 (ref 5.0–8.0)
PROTEIN, URINE: NEGATIVE
SPECIFIC GRAVITY, URINE: 1.01 (ref 1.003–1.030)
UROBILINOGEN URINE: 0.2 (ref 0.2–1.0)

## 2020-08-17 NOTE — Progress Notes (Signed)
CC: kidney stones    55 yo female hx laser URS with Dr Gwinda Passe Dec 09, 2019.  Ca Ox stone.    Has passed a stone previously on her own.      No pain at this time. No fever/chills.   No renal colic  No hematuria  No dysuria    Fluid intake: around 4 bottles water/d.     She had renal US and is here for followup.      REVIEW OF SYSTEMS:  CONSTITUTIONAL: Negative for any fever, weight loss or weight gain.  HEENT: No visual problems. No sore throat.   CARDIOVASCULAR: No chest pain/palpitation.  PULMONARY: Denies any wheeze. No SOB  GASTROINTESTINAL: Denies any nausea, vomiting. No constipation.  GENITOURINARY: Voiding as above in HPI.  NEUROLOGIC: No mental status changes or visual changes. No seizures or paresthesias. No neurologic changes.  ENDOCRINE: No diabetes. no thyroid disease.  SKIN: Denies any skin lesions. No rashes.  PSYCHIATRIC: no psychiatric illness.  MUSCULOSKETAL:  Normal gait.       Past Medical History: reviewed, and no sig changes.   Past Family History: reviewed, and no sig changes.  Social History: reviewed, and no sig changes.  Medications: list reviewed, and without any sig interactions/side effects, unless as noted above in HPI.      PHYSICAL EXAMINATION:  GENERAL: The patient is healthy-appearing. Well developed. Well nourished. No acute distress.  SKIN: No rashes. No lesions.  HEENT: Atraumatic, normocephalic. Tongue midline. Conjunctivae and sclerae are clear. Extraocular movements are full. Oral mucosa normal. Pharynx is negative.  NECK: Supple. There is no lymphadenopathy. No masses are noted. Trachea is midline.   LUNGS: Normal respiratory effort. No wheezing.  CARDIOVASCULAR: Regular pulses in upper extremities.   ABDOMEN: No organomegaly. No masses. No tenderness. No CVA tenderness.  NEUROLOGICAL: Alert and oriented. Good gait and balance.      Renal US  IMPRESSION:     1. Unremarkable ultrasound appearance of the kidneys and bladder. No hydronephrosis or visible renal calculus.     2.  Echogenic liver suggests hepatic steatosis. Co-existing hepatic fibrosis or cirrhosis cannot be excluded by sonographic imaging. .         55 yo female hx kidney stones.   [place] without any renal issues    Prior CT and bloodwork in 2021 without any sig liver findings      RTC 1y, [place]

## 2020-09-03 ENCOUNTER — Ambulatory Visit
Admission: RE | Admit: 2020-09-03 | Discharge: 2020-09-03 | Disposition: A | Discharge status: Home / Self Care | Payer: No Typology Code available for payment source | Attending: Clinical Cardiac Electrophysiology | Admitting: Clinical Cardiac Electrophysiology

## 2020-09-03 ENCOUNTER — Other Ambulatory Visit: Payer: Self-pay

## 2020-09-03 DIAGNOSIS — R0600 Dyspnea, unspecified: Secondary | ICD-10-CM | POA: Insufficient documentation

## 2020-09-03 DIAGNOSIS — R0609 Other forms of dyspnea: Secondary | ICD-10-CM

## 2020-09-03 MED ORDER — PERFLUTREN DILUTED IV BOLUS
1.0000 mL | Status: DC
Start: 2020-09-03 — End: 2020-09-04
  Administered 2020-09-03 (×2): 2 mL via INTRAVENOUS
  Filled 2020-09-03: qty 3

## 2020-09-03 NOTE — Progress Notes (Signed)
Pt. Arrived to stress lab.  Test explained and consent obtained.   Brief history  and assessment obtained HR  82 BP  100/70  spO2  96%.  IV started with no complications.  Echo images obtained.   Treadmill test started and continued w/o complications, test ended d/t leg fatigue and throat pressure.  5/10 throat pressure which she feels at home, non radiating and does not change with inspiration at peak exercise. Decreased to 3/10 by 2 minutes recovery, resolved by 5 minutes recovery.  85%MPH achieved.  Echo images obtained and pt. Observed for 5 minutes. Vitals WNL.  IV removed and Pt. Left stress lab in stable condition.   Please see Stress Report in Cardiopulmonary tab.  Dante Gang, RN, 09/03/2020

## 2020-09-07 ENCOUNTER — Telehealth (HOSPITAL_BASED_OUTPATIENT_CLINIC_OR_DEPARTMENT_OTHER): Payer: Self-pay | Admitting: General Practice

## 2020-09-07 NOTE — Telephone Encounter (Signed)
Called patient via telephone interpreter ([place]). Results discussed as noted below. Patient confirmed understanding and had no further questions.    [name], MD  P [facility] Nurses Pool  Please let her know that her stress echo was normal. Thanks. [name]

## 2020-09-07 NOTE — Telephone Encounter (Signed)
-----   Message from [name], MD sent at 09/03/2020  7:23 PM EDT -----  Please let her know that her stress echo was normal. Thanks. [name]

## 2020-09-10 ENCOUNTER — Other Ambulatory Visit (HOSPITAL_BASED_OUTPATIENT_CLINIC_OR_DEPARTMENT_OTHER): Payer: Self-pay | Admitting: Family Medicine

## 2020-09-10 DIAGNOSIS — E559 Vitamin D deficiency, unspecified: Secondary | ICD-10-CM

## 2020-09-10 NOTE — Telephone Encounter (Signed)
Please contact patient to schedule CPE, thank you, Judson Roch

## 2020-09-10 NOTE — Telephone Encounter (Signed)
PER Pharmacy, Deborah [name] is a 55 year old female has requested a refill of vitamin d.      Last Office Visit: 12/11/19 with Pricilla Handler  Last Physical Exam: 03/13/2017    PAP SMEAR due on 06/30/2019  HPV SCREENING due on 06/30/2019    Other Med Adult:  Most Recent BP Reading(s)  07/06/20 : 118/78        Cholesterol (mg/dL)   Date Value   02/17/2019 187     CALCULI ANALYSIS CHOLESTEROL (no units)   Date Value   11/12/2019 TNP     LOW DENSITY LIPOPROTEIN DIRECT (mg/dL)   Date Value   02/17/2019 121     HIGH DENSITY LIPOPROTEIN (mg/dL)   Date Value   02/17/2019 44     TRIGLYCERIDES (mg/dL)   Date Value   02/17/2019 197 (H)         THYROID SCREEN TSH REFLEX FT4 (uIU/mL)   Date Value   02/17/2019 1.793         TSH (THYROID STIM HORMONE) (uIU/mL)   Date Value   12/22/2010 1.31       HEMOGLOBIN A1C (%)   Date Value   02/17/2019 5.0       No results found for: POCA1C      INR (no units)   Date Value   06/15/2008 1.0 (L)       SODIUM (mmol/L)   Date Value   07/06/2020 143       POTASSIUM (mmol/L)   Date Value   07/06/2020 4.4           CREATININE (mg/dL)   Date Value   07/06/2020 0.7       Documented patient preferred pharmacies:    [facility] [place].  Phone: [phone] Fax: [phone]

## 2020-09-13 ENCOUNTER — Telehealth (HOSPITAL_BASED_OUTPATIENT_CLINIC_OR_DEPARTMENT_OTHER): Payer: Self-pay

## 2020-09-13 NOTE — Telephone Encounter (Signed)
Lvm for patient to call back to schedule an appt for CPE per PCP.    [place], 09/13/2020

## 2020-11-09 ENCOUNTER — Ambulatory Visit (HOSPITAL_BASED_OUTPATIENT_CLINIC_OR_DEPARTMENT_OTHER): Payer: Self-pay | Admitting: Clinical Cardiac Electrophysiology

## 2020-12-06 ENCOUNTER — Ambulatory Visit (HOSPITAL_BASED_OUTPATIENT_CLINIC_OR_DEPARTMENT_OTHER): Payer: Self-pay | Admitting: Ophthalmology

## 2020-12-15 ENCOUNTER — Other Ambulatory Visit (HOSPITAL_BASED_OUTPATIENT_CLINIC_OR_DEPARTMENT_OTHER): Payer: Self-pay | Admitting: Internal Medicine

## 2020-12-15 NOTE — Telephone Encounter (Signed)
PER Pharmacy, Deborah [name] is a 55 year old female has requested a refill of metoprolol 100 mg tab.      Last Office Visit: [id number] with Hassell Halim  Last Physical Exam: [id number]      Other Med Adult:  Most Recent BP Reading(s)  07/06/20 : 118/78        Cholesterol (mg/dL)   Date Value   02/17/2019 187     CALCULI ANALYSIS CHOLESTEROL (no units)   Date Value   11/12/2019 TNP     LOW DENSITY LIPOPROTEIN DIRECT (mg/dL)   Date Value   02/17/2019 121     HIGH DENSITY LIPOPROTEIN (mg/dL)   Date Value   02/17/2019 44     TRIGLYCERIDES (mg/dL)   Date Value   02/17/2019 197 (H)         THYROID SCREEN TSH REFLEX FT4 (uIU/mL)   Date Value   02/17/2019 1.793         TSH (THYROID STIM HORMONE) (uIU/mL)   Date Value   12/22/2010 1.31       HEMOGLOBIN A1C (%)   Date Value   02/17/2019 5.0       No results found for: POCA1C      INR (no units)   Date Value   06/15/2008 1.0 (L)       SODIUM (mmol/L)   Date Value   07/06/2020 143       POTASSIUM (mmol/L)   Date Value   07/06/2020 4.4           CREATININE (mg/dL)   Date Value   07/06/2020 0.7       Documented patient preferred pharmacies:    [place], [place] [facility] [place].  Phone: [phone] Fax: [phone]

## 2020-12-24 ENCOUNTER — Other Ambulatory Visit: Payer: Self-pay

## 2020-12-24 ENCOUNTER — Encounter (HOSPITAL_BASED_OUTPATIENT_CLINIC_OR_DEPARTMENT_OTHER): Payer: Self-pay | Admitting: Ophthalmology

## 2020-12-24 ENCOUNTER — Ambulatory Visit: Payer: No Typology Code available for payment source | Attending: Ophthalmology | Admitting: Ophthalmology

## 2020-12-24 DIAGNOSIS — H5203 Hypermetropia, bilateral: Secondary | ICD-10-CM | POA: Diagnosis present

## 2020-12-24 DIAGNOSIS — H2513 Age-related nuclear cataract, bilateral: Secondary | ICD-10-CM | POA: Diagnosis present

## 2020-12-24 DIAGNOSIS — H31012 Macula scars of posterior pole (postinflammatory) (post-traumatic), left eye: Secondary | ICD-10-CM | POA: Diagnosis present

## 2020-12-24 DIAGNOSIS — H52203 Unspecified astigmatism, bilateral: Secondary | ICD-10-CM | POA: Diagnosis present

## 2020-12-24 DIAGNOSIS — H524 Presbyopia: Secondary | ICD-10-CM | POA: Insufficient documentation

## 2020-12-24 HISTORY — DX: Age-related nuclear cataract, bilateral: H25.13

## 2020-12-24 NOTE — Progress Notes (Signed)
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 For comprehensive eye exam.  The macular scar is stable, no change. No specific therapy. Follow.    Cataracts, both eyes, early, mild nuclear sclerosis. No specific therapy yet.    Hyperopia with astigmatism and presbyopia. She's given a prescription for glasses.

## 2020-12-24 NOTE — Progress Notes (Signed)
pt here for an annual eye exam    Pt denies problems or change with va or ou's  Denies flashes or floaters ou  Doesn't use eye gtts    Ocular HX-Macular chorioretinal scar OS  Hyperopia,astigmatism and presbyopia    LEE-02-24-19    Macular OCT OU done today

## 2020-12-27 ENCOUNTER — Encounter (HOSPITAL_BASED_OUTPATIENT_CLINIC_OR_DEPARTMENT_OTHER): Payer: Self-pay

## 2021-02-08 ENCOUNTER — Other Ambulatory Visit: Payer: Self-pay

## 2021-02-08 ENCOUNTER — Encounter (HOSPITAL_BASED_OUTPATIENT_CLINIC_OR_DEPARTMENT_OTHER): Payer: Self-pay | Admitting: Clinical Cardiac Electrophysiology

## 2021-02-08 ENCOUNTER — Ambulatory Visit
Payer: No Typology Code available for payment source | Attending: Clinical Cardiac Electrophysiology | Admitting: Clinical Cardiac Electrophysiology

## 2021-02-08 VITALS — BP 119/79 | HR 67 | Temp 97.2°F | Wt 188.0 lb

## 2021-02-08 DIAGNOSIS — R0609 Other forms of dyspnea: Secondary | ICD-10-CM | POA: Insufficient documentation

## 2021-02-08 DIAGNOSIS — I471 Supraventricular tachycardia, unspecified: Secondary | ICD-10-CM

## 2021-02-08 DIAGNOSIS — I208 Other forms of angina pectoris: Secondary | ICD-10-CM | POA: Diagnosis present

## 2021-02-08 DIAGNOSIS — R0989 Other specified symptoms and signs involving the circulatory and respiratory systems: Secondary | ICD-10-CM | POA: Insufficient documentation

## 2021-02-08 DIAGNOSIS — I2089 Other forms of angina pectoris: Secondary | ICD-10-CM

## 2021-02-08 NOTE — Progress Notes (Signed)
OUTPATIENT CARDIOLOGY/ELECTROPHYSIOLOGY PROGRESS NOTE  Date of visit: 02/08/2021    Patient returns for F/u of SVT and exertional SOB/throat tightness.    In summary patient is 55 year old female with the following medical problems:  Exertional angina ([place])  (primary encounter diagnosis)  Throat tightness  DOE (dyspnea on exertion)  SVT To 240 bpm (diagnosed March 2010); on Toprol XL; recurrent symptoms relieved w/ vagal maneuvers    Patient was last seen in [facility] on 07/06/2020.    Since early March 2022, she reports increased DOE and now reports that when she exerts a lot, she also develops throat tightness on exertion associated with the DOE. DOE symptoms are stable since March 2022 but worse when compared to last year; exertional throat tightness worsened a few months ago but has spontaneously improved over the past month; last episode was about 1 month ago. Symptoms are particularly prominent when climbing stairs.    ETT-Echo 09/03/20 showed no ischemia but I am concerned about her symptoms still being c/w angina.    As far as palpitations, she had only three short episodes since March 2022 - Two episodes lasted a few seconds; one lasted about 2 minutes. Woke up with symptoms and stopped with vagal maneuvers. No associated symptoms. Generally, SVT episodes occur in the middle of the night following a day when she feels particularly sad.    No orthopnea or PND. No edema.    Current Medications:  metoprolol (TOPROL-XL) 100 MG 24 hr tablet, TAKE 1 TABLET BY MOUTH DAILY, Disp: 90 tablet, Rfl: 2  cholecalciferol (VITAMIN D3) 2000 UNIT tablet, TAKE 1 TABLET BY MOUTH DAILY, Disp: 90 tablet, Rfl: 3  omeprazole (PRILOSEC) 20 MG capsule, TAKE 1 CAPSULE BY MOUTH 2 (TWO) TIMES DAILY BEFORE MEALS, Disp: 60 capsule, Rfl: 0  trolamine salicylate (ASPERCREME) 10 % cream, Apply topically 2 (two) times daily, Disp: 85 g, Rfl: 11  diclofenac (VOLTAREN) 1 % GEL Gel, APPLY TO BOTH HANDS, BOTH ELBOWS AND BOTH KNEES  UP TO 4 TIMES A DAY. DO NOT EXCEED 32 GRAMS IN 24 HOURS, Disp: 100 g, Rfl: 3    No current facility-administered medications on file prior to visit.      Review of Patient's Allergies indicates:   Naproxen                Swelling    Comment:Ed visit with angioedema   Ibuprofen               Other (See Comments)    Comment:Epigastric pain, throat swelling?             05/06/12 pt states uses sometimes without issue   Morphine                Other (See Comments)    Comment:"THE LAST TIME I WAS HERE, THEY GAVE IT TO ME AND             TOLD ME TO TELL THE DOCTOR NOT TO GIVE IT TO ME             ANYMORE. IT MADE ME ACT DIFFERENT".   Pollen extract-tree*    Runny Nose    Comment:HA, itchy watery eyes    FAMILY HISTORY: Father has AFib. Mother is diabetes and has HTN.    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 Lives with son (90 yrs), father and mother. No cigs, EtOH, or drugs.  ROS: All other systems were pertinently reviewed and were negative unless otherwise outlined above.    PHYSICAL EXAM:  General: Overweight; normal appearance for age; no apparent distress   02/08/21  0941   BP: 119/79   Site: Right Arm   Position: Sitting   Cuff Size: Large   Pulse: 67   Temp: 97.2 F (36.2 C)   TempSrc: Temporal   SpO2: 98%   Weight: 85.3 kg (188 lb)     HEENT: Oral mucosa is moist, no icterus, no pallor noted.  NECK: Supple. No thyromegaly.  CHEST: Clear to auscultation, no crackles or rhonchi. No wheezes.  HEART: Normal JVP. Carotid pulse are +2 bilaterally with normal upstrokes and no bruits. Regular rate and rhythm. No murmurs, rubs, or gallops.   ABDOMEN: Soft, no organomegaly detected. No abdominal bruits.  EXTREMITIES: Warm and well perfused. Peripheral pulses are +2 bilaterally in both upper and lower extremities. There is no evidence of peripheral pedal edema.    PSYCHIATRIC: Mood and affect are appropriate.  SKIN: No rash observed.    PERTINENT INVESTIGATIONS:  1. EKG  today (on my personal review): Sinus rhythm at 70 bpm. Normal intervals. QRS axis 59. Normal tracing. Compared to the ECG from 07/06/20, no significant change.  2. LABS:   Lab Results   Component Value Date    NA 143 07/06/2020    K 4.4 07/06/2020    CL 106 07/06/2020    CO2 27 07/06/2020    BUN 17 07/06/2020    CREAT 0.7 07/06/2020    GLUCOSER 91 07/06/2020     Lab Results   Component Value Date    LDL 121 02/17/2019    HDL 44 02/17/2019    TG 197 (H) 02/17/2019     ECHO 02/22/2016:  1. LV ejection fraction is 60%.    2. There are no regional wall motion abnormalities.    3. Normal study.    4. Compared to the previous study dated 04/18/11, there is no significant change.    30-Day Event Monitor Aug/Sept 2021:  Baseline rhythm at sinus at 76 bpm. There were no symptomatic recordings. There were 8 auto-triggered recordings which showed sinus at 47-138 bpm with no ectopy.    Stress Echo 09/03/2020:  1. Bruce Protocol to 5 minutes and 7 METS, achieving 141 BPM = 85% max predicted. Good double product of 24. Exercise capacity was 95% of normal.   2. 5/10 non-pleuritic throat pressure typical of that of chief complaint developed during exercise and subsided over 5 minutes of recovery.   3. No ST changes developed.   4. LV function was normal by echo at rest and with exertion, with no regional wall motion abnormalities.   5. No evidence of inducible ischemia.      ASSESSMENT:  55 year old woman with structurally normal heart on echo and normal baseline ECG who presented with her first episode of rapid SVT in March 2010. SVT is mostly suppressed with Toprol XL although she still has recurrent but very short lived symptoms. She wishes to continue med Rx for SVT.    She complains of increased DOE since March 2022 which is associated with exertional throat tightness at times when climbing stairs. ETT-Echo in late May 2022 showed no ischemia but symptoms were reproduced during exercise. I am still concerned that symptoms are  c/w exertional angina. I discussed cardiac catheterization versus coronary CTA with her today along with risks and benefits of each. Patient opts for coronary  CTA which will be scheduled at [facility].    PLAN:  (I20.8) Exertional angina ([place]) (primary encounter diagnosis)  (R09.89) Throat tightness  (R06.00) DOE (dyspnea on exertion)  Comment: ETT-Echo late May 2022 showed no ischemia although I am still concerned that her symptoms are c/w exertional angina.  Plan:  1. Coronary CTA to be scheduled at [facility] - will check BMP and fasting lipids.  2. She knows to call 911 for any episodes of SOB/throat tightness or CP lasting >5-10 minutes.    (I47.1) SVT To 240 bpm (diagnosed March 2010); on Toprol XL; recurrent symptoms relieved w/ vagal maneuvers    Comment: Suspect AVNRT although I cannot retrieve her SVT ECG from March 2010.  Plan:   1. Continue Toprol XL 100 mg qPM.  2. If symptoms progress, will consider SVT ablation.      Return for follow up in Cardiology Clinic in 6 months. To contact earlier if there are any cardiac related issues.  I spent a total of 35 minutes on this visit on the date of service (total time includes all activities performed on the date of service).    This Cardiology Division patient encounter note was created using voice-recognition software and in real time during the clinic visit. Please excuse any typographical errors that have not been edited out.     Electronically signed by: [name], MD, 02/08/2021 5:51 PM

## 2021-02-09 ENCOUNTER — Encounter (HOSPITAL_BASED_OUTPATIENT_CLINIC_OR_DEPARTMENT_OTHER): Payer: Self-pay

## 2021-02-09 DIAGNOSIS — I208 Other forms of angina pectoris: Secondary | ICD-10-CM | POA: Insufficient documentation

## 2021-02-09 DIAGNOSIS — I2089 Other forms of angina pectoris: Secondary | ICD-10-CM | POA: Insufficient documentation

## 2021-02-09 NOTE — Addendum Note (Signed)
Addended by: Clemetine Marker on: 02/09/2021 08:12 AM     Modules accepted: Orders

## 2021-02-12 LAB — EKG

## 2021-03-21 ENCOUNTER — Other Ambulatory Visit (HOSPITAL_BASED_OUTPATIENT_CLINIC_OR_DEPARTMENT_OTHER): Payer: Self-pay | Admitting: Physician Assistant

## 2021-03-21 NOTE — Telephone Encounter (Signed)
[facility], Deborah [name] is a 55 year old female has requested a refill of Voltaren Gel .      Last Office Visit: 12/11/2019 with [name], APRN  Last Physical Exam: 03/13/2017    PAP SMEAR due on 06/30/2019  HPV SCREENING due on 06/30/2019  FECAL OCCULT BLOOD AGE 36+ due on 02/02/2021    Other Med Adult:  Most Recent BP Reading(s)  02/08/21 : 119/79        Cholesterol (mg/dL)   Date Value   02/17/2019 187     CALCULI ANALYSIS CHOLESTEROL (no units)   Date Value   11/12/2019 TNP     LOW DENSITY LIPOPROTEIN DIRECT (mg/dL)   Date Value   02/17/2019 121     HIGH DENSITY LIPOPROTEIN (mg/dL)   Date Value   02/17/2019 44     TRIGLYCERIDES (mg/dL)   Date Value   02/17/2019 197 (H)         THYROID SCREEN TSH REFLEX FT4 (uIU/mL)   Date Value   02/17/2019 1.793         TSH (THYROID STIM HORMONE) (uIU/mL)   Date Value   12/22/2010 1.31       HEMOGLOBIN A1C (%)   Date Value   02/17/2019 5.0       No results found for: POCA1C      INR (no units)   Date Value   06/15/2008 1.0 (L)       SODIUM (mmol/L)   Date Value   07/06/2020 143       POTASSIUM (mmol/L)   Date Value   07/06/2020 4.4           CREATININE (mg/dL)   Date Value   07/06/2020 0.7       Documented patient preferred pharmacies:    [place], [place] - [place].  Phone: [phone] Fax: [phone]

## 2021-03-22 ENCOUNTER — Ambulatory Visit (HOSPITAL_BASED_OUTPATIENT_CLINIC_OR_DEPARTMENT_OTHER): Payer: Self-pay

## 2021-03-22 NOTE — Progress Notes (Deleted)
HPI    Deborah [name] is a 55 year old female here for f/u ***. Requested refill of topical diclofenac, but 1/22 rheum note from Dr. [name] states it wasn't very effective, she prescribed trial of trolamine salicylate instead - ?effective, ?side effects.     H/o reported angioedema to NSAIDs but has tolerated PO ibuprofen?*** ?taking regularly, ?GI upset, ?continuing omeprazole    Health maintenance:  Due for ?COVID vaccination, Shingrix, flu shot  Due for ifob or colonoscopy  Due for Pap smear      Current Outpatient Medications:     metoprolol (TOPROL-XL) 100 MG 24 hr tablet, TAKE 1 TABLET BY MOUTH DAILY, Disp: 90 tablet, Rfl: 2    cholecalciferol (VITAMIN D3) 2000 UNIT tablet, TAKE 1 TABLET BY MOUTH DAILY, Disp: 90 tablet, Rfl: 3    omeprazole (PRILOSEC) 20 MG capsule, TAKE 1 CAPSULE BY MOUTH 2 (TWO) TIMES DAILY BEFORE MEALS, Disp: 60 capsule, Rfl: 0    trolamine salicylate (ASPERCREME) 10 % cream, Apply topically 2 (two) times daily, Disp: 85 g, Rfl: 11    Review of Patient's Allergies indicates:   Naproxen                Swelling    Comment:Ed visit with angioedema   Ibuprofen               Other (See Comments)    Comment:Epigastric pain, throat swelling?             05/06/12 pt states uses sometimes without issue   Morphine                Other (See Comments)    Comment:"THE LAST TIME I WAS HERE, THEY GAVE IT TO ME AND             TOLD ME TO TELL THE DOCTOR NOT TO GIVE IT TO ME             ANYMORE. IT MADE ME ACT DIFFERENT".   Pollen extract-tree*    Runny Nose    Comment:HA, itchy watery eyes    Past Medical History:  04/04/2006: Abdominal pain, epigastric      Comment:  Nml LFTs, Hep A/B/C serologies neg; RUQ U/S shows no                hepatobiliary abnormalities (completely normal)  Seen by                GI 3/08 with evaluation pending  12/24/2020: Age-related nuclear cataract of both eyes      Comment:  Mild, early   06/30/2014: Bilateral arm pain  03/15/2015: Chronic right-sided low back pain  with right-sided sciatica  02/12/2019: Covid Infection March 2021 -- Ill But Not Hosp'd.      Comment:  Risk category:   Low Tested? Positive Community managed:               Principal risk manager: Requires additional outreach: No                Risk factors: Obesity, latinX  Clinical Course First date               of symptoms:    06/18/19 06/27/19: (DOI 9): CM Result call               - tested positive at outside location (3/15). Overall her               sx have greatly improved,  main complaint is nasal                congestion. Previously had sore throat, fevers, and cough  02/22/2008: CTS (Carpal Tunnel Syndrome), b/l      Comment:  Seen by ortho 02/21/08, referred back to physiatry for                EMG  02/22/2008: De Quervain's Tenosynovitis, R      Comment:  Seen by ortho 02/21/08 referred to OT/splinting  No date: Esophageal reflux  10/07/2008: Greater Trochanteric Bursitis, b/l      Comment:  Seen by physiatry with L steroid injection  No date: Heart disease  05/18/2017: Hyperopia of both eyes with astigmatism and presbyopia  No date: Irregular menstrual cycle  06/06/2017: Lateral epicondylitis of right elbow  11/12/2019: Left ureteral stone  11/14/2006: Leg pain      Comment:  Intermittent, with fatigue, subj weakness; elevated ESR,               but nml CRP/TSH/Ferritin/ANA  Seen by physiatry 09/28/08                with MRI pending  02/25/2013: Lump of skin of lower extremity      Comment:  Dermatofibroma R foot on biopsy  11/10/2004: Lump or mass in breast  05/18/2017: Macular chorioretinal scar of left eye      Comment:  Temporal to fovea, left eye  07/27/2006: Plantar Fasciitis, L      Comment:  S/p cortisone injxn with podiatry 07/25/06  06/30/2014: Positive occult stool blood test  No date: Pregnant state, incidental      Comment:  c/s breech  06/06/2017: Right shoulder injury  08/08/2007: Right subacromial bursitis/rotator cuff tendinopathy      Comment:  S/p steroid injection with physiatry 07/08/08  3/10: SVT  (supraventricular tachycardia) ([place])      Comment:  [facility]  08/08/2007: Triggering of Finger, R 3rd      Comment:  Seen by Dr. Joylene John 02/21/08 s/p steroid injection; again               10/07/08    Patient Active Problem List:     Gastroesophageal reflux disease without esophagitis     Other specified gastritis     Allergy to naproxen     Chondromalacia patellae     Lower back pain     Family history of diabetes mellitus (DM)     Intermittent spinal claudication ([place])     Vitamin D deficiency     DDD (degenerative disc disease), lumbar     Bilateral chronic knee pain     SVT To 240 bpm (diagnosed March 2010); on Toprol XL; recurrent symptoms relieved w/ vagal maneuvers     BMI 33.0-33.9,adult     Macular chorioretinal scar of left eye     Hyperopia of both eyes with astigmatism and presbyopia     Osteoarthritis of both knees     Trigger middle finger of right hand     Primary osteoarthritis of first carpometacarpal joint of left hand     Chronic right shoulder pain     History of kidney stones     Age-related nuclear cataract of both eyes     Exertional angina (HCC)      family history includes Diabetes in her maternal uncle, maternal uncle, and mother; Glaucoma in her maternal uncle; Heart in her father, maternal grandmother, maternal uncle, and maternal uncle; Hypertension in her  father and mother.    Physical Exam    LMP 10/17/2006     Constitutional: Alert and oriented, appears well, no acute distress    Assessment and Plan    There are no diagnoses linked to this encounter.     Jonathon Resides, CNP

## 2021-03-29 ENCOUNTER — Ambulatory Visit (HOSPITAL_BASED_OUTPATIENT_CLINIC_OR_DEPARTMENT_OTHER): Payer: Self-pay

## 2021-04-12 ENCOUNTER — Ambulatory Visit (HOSPITAL_BASED_OUTPATIENT_CLINIC_OR_DEPARTMENT_OTHER): Payer: Self-pay

## 2021-04-19 ENCOUNTER — Encounter (HOSPITAL_BASED_OUTPATIENT_CLINIC_OR_DEPARTMENT_OTHER): Payer: Self-pay

## 2021-04-19 ENCOUNTER — Other Ambulatory Visit: Payer: Self-pay

## 2021-04-19 ENCOUNTER — Ambulatory Visit: Payer: No Typology Code available for payment source | Attending: Internal Medicine

## 2021-04-19 VITALS — BP 124/77 | HR 73 | Temp 97.9°F | Wt 188.0 lb

## 2021-04-19 DIAGNOSIS — G8929 Other chronic pain: Secondary | ICD-10-CM | POA: Insufficient documentation

## 2021-04-19 DIAGNOSIS — Z6833 Body mass index (BMI) 33.0-33.9, adult: Secondary | ICD-10-CM | POA: Insufficient documentation

## 2021-04-19 DIAGNOSIS — Z1211 Encounter for screening for malignant neoplasm of colon: Secondary | ICD-10-CM | POA: Diagnosis present

## 2021-04-19 DIAGNOSIS — E559 Vitamin D deficiency, unspecified: Secondary | ICD-10-CM | POA: Diagnosis present

## 2021-04-19 DIAGNOSIS — I208 Other forms of angina pectoris: Secondary | ICD-10-CM | POA: Diagnosis present

## 2021-04-19 DIAGNOSIS — M25562 Pain in left knee: Secondary | ICD-10-CM | POA: Insufficient documentation

## 2021-04-19 DIAGNOSIS — Z23 Encounter for immunization: Secondary | ICD-10-CM | POA: Diagnosis present

## 2021-04-19 DIAGNOSIS — M79645 Pain in left finger(s): Secondary | ICD-10-CM | POA: Diagnosis present

## 2021-04-19 DIAGNOSIS — I2089 Other forms of angina pectoris: Secondary | ICD-10-CM

## 2021-04-19 LAB — THYROID SCREEN TSH REFLEX FT4: THYROID SCREEN TSH REFLEX FT4: 2.18 u[IU]/mL (ref 0.270–4.200)

## 2021-04-19 LAB — C-REACTIVE PROTEIN: C-REACTIVE PROTEIN: 7 mg/L (ref 0–18)

## 2021-04-19 LAB — VITAMIN D,25 HYDROXY: VITAMIN D,25 HYDROXY: 23 ng/mL — ABNORMAL LOW (ref 30.0–100.0)

## 2021-04-19 LAB — RBC SEDIMENTATION RATE: RBC SEDIMENTATION RATE: 14 MM/HR (ref 0–30)

## 2021-04-19 MED ORDER — DICLOFENAC SODIUM 1 % EX GEL
4.0000 g | Freq: Four times a day (QID) | CUTANEOUS | 3 refills | Status: DC
Start: 2021-04-19 — End: 2022-03-14

## 2021-04-19 NOTE — Progress Notes (Signed)
HPI    Deborah [name] is a 56 year old female who c/o years of worsening shoulder, elbow, wrist, thumb, knee, ankle pain. Migratory, not every joint every day. Some morning stiffness, but not more than 10 minutes. In the past she had some hand numbness and tingling, got better after intentionally losing some weight over the past few months. At times left knee has looked swollen, L base of thumb has looked swollen, denies other joint swelling, redness, or heat. Had fever, ear pain 13 days ago, took amoxicillin that she had from home for 10 days, symptoms have resolved. No other episodes of f/c/s. She has a physically grueling job as a Engineer, building services.     Allergic to all oral NSAIDs but has used topical diclofenac with good effect for joint pain in the past.       Current Outpatient Medications:     cholecalciferol (VITAMIN D3) 2000 UNIT tablet, Take 1 tablet by mouth in the morning., Disp: 90 tablet, Rfl: 3    diclofenac (VOLTAREN) 1 % GEL Gel, Apply 4 g topically in the morning and 4 g at noon and 4 g in the evening and 4 g before bedtime., Disp: 100 g, Rfl: 3    metoprolol (TOPROL-XL) 100 MG 24 hr tablet, TAKE 1 TABLET BY MOUTH DAILY, Disp: 90 tablet, Rfl: 2    omeprazole (PRILOSEC) 20 MG capsule, TAKE 1 CAPSULE BY MOUTH 2 (TWO) TIMES DAILY BEFORE MEALS, Disp: 60 capsule, Rfl: 0    Review of Patient's Allergies indicates:   Naproxen                Swelling    Comment:Ed visit with angioedema   Ibuprofen               Other (See Comments)    Comment:Epigastric pain, throat swelling?             05/06/12 pt states uses sometimes without issue   Morphine                Other (See Comments)    Comment:"THE LAST TIME I WAS HERE, THEY GAVE IT TO ME AND             TOLD ME TO TELL THE DOCTOR NOT TO GIVE IT TO ME             ANYMORE. IT MADE ME ACT DIFFERENT".   Pollen extract-tree*    Runny Nose    Comment:HA, itchy watery eyes    Past Medical History:  04/04/2006: Abdominal pain, epigastric      Comment:  Nml  LFTs, Hep A/B/C serologies neg; RUQ U/S shows no                hepatobiliary abnormalities (completely normal)  Seen by                GI 3/08 with evaluation pending  12/24/2020: Age-related nuclear cataract of both eyes      Comment:  Mild, early   06/30/2014: Bilateral arm pain  03/15/2015: Chronic right-sided low back pain with right-sided sciatica  02/12/2019: Covid Infection March 2021 -- Ill But Not Hosp'd.      Comment:  Risk category:   Low Tested? Positive Community managed:               Principal risk manager: Requires additional outreach: No                Risk factors: Obesity, latinX  Clinical Course First date               of symptoms:    06/18/19 06/27/19: (DOI 9): CM Result call               - tested positive at outside location (3/15). Overall her               sx have greatly improved, main complaint is nasal                congestion. Previously had sore throat, fevers, and cough  02/22/2008: CTS (Carpal Tunnel Syndrome), b/l      Comment:  Seen by ortho 02/21/08, referred back to physiatry for                EMG  02/22/2008: De Quervain's Tenosynovitis, R      Comment:  Seen by ortho 02/21/08 referred to OT/splinting  No date: Esophageal reflux  10/07/2008: Greater Trochanteric Bursitis, b/l      Comment:  Seen by physiatry with L steroid injection  No date: Heart disease  05/18/2017: Hyperopia of both eyes with astigmatism and presbyopia  No date: Irregular menstrual cycle  06/06/2017: Lateral epicondylitis of right elbow  11/12/2019: Left ureteral stone  11/14/2006: Leg pain      Comment:  Intermittent, with fatigue, subj weakness; elevated ESR,               but nml CRP/TSH/Ferritin/ANA  Seen by physiatry 09/28/08                with MRI pending  02/25/2013: Lump of skin of lower extremity      Comment:  Dermatofibroma R foot on biopsy  11/10/2004: Lump or mass in breast  05/18/2017: Macular chorioretinal scar of left eye      Comment:  Temporal to fovea, left eye  07/27/2006: Plantar Fasciitis, L       Comment:  S/p cortisone injxn with podiatry 07/25/06  06/30/2014: Positive occult stool blood test  No date: Pregnant state, incidental      Comment:  c/s breech  06/06/2017: Right shoulder injury  08/08/2007: Right subacromial bursitis/rotator cuff tendinopathy      Comment:  S/p steroid injection with physiatry 07/08/08  3/10: SVT (supraventricular tachycardia) ([place])      Comment:  [facility]  08/08/2007: Triggering of Finger, R 3rd      Comment:  Seen by Dr. Joylene John 02/21/08 s/p steroid injection; again               10/07/08    Patient Active Problem List:     Gastroesophageal reflux disease without esophagitis     Other specified gastritis     Allergy to naproxen     Chondromalacia patellae     Lower back pain     Family history of diabetes mellitus (DM)     Intermittent spinal claudication ([place])     Vitamin D deficiency     DDD (degenerative disc disease), lumbar     Bilateral chronic knee pain     SVT To 240 bpm (diagnosed March 2010); on Toprol XL; recurrent symptoms relieved w/ vagal maneuvers     BMI 33.0-33.9,adult     Macular chorioretinal scar of left eye     Hyperopia of both eyes with astigmatism and presbyopia     Osteoarthritis of both knees     Trigger middle finger of right hand     Primary osteoarthritis of first carpometacarpal joint of  left hand     Chronic right shoulder pain     History of kidney stones     Age-related nuclear cataract of both eyes     Exertional angina (HCC)      family history includes Diabetes in her maternal uncle, maternal uncle, and mother; Glaucoma in her maternal uncle; Heart in her father, maternal grandmother, maternal uncle, and maternal uncle; Hypertension in her father and mother.    Physical Exam    BP 124/77 (Site: LA, Position: Sitting, Cuff Size: Reg)    Pulse 73    Temp 97.9 F (36.6 C) (Temporal)    Wt 85.3 kg (188 lb)    LMP 10/17/2006    SpO2 100%    BMI 35.52 kg/m     Constitutional: Alert and oriented, appears well, no acute distress, VSS  FROM shoulder, elbow,  wrist, finger, knee, ankle joints. Subtle fullness medial L knee inferior to joint line, no erythema, heat, or tenderness to palp. No other evidence of synovitis to small or large joints.     Assessment and Plan    Treasa was seen today for follow up.    Diagnoses and all orders for this visit:    Chronic pain of left knee  Comments:  Suspect polyarthralgia d/t OA, check L knee, thumb films. Tx symptoms w/topical diclofenac, add PO Tylenol prn, heat/ice to sore joints prn  Orders:  -     XR KNEE LEFT 3 VIEWS; Future  -     C-REACTIVE PROTEIN  -     RBC SEDIMENTATION RATE  -     diclofenac (VOLTAREN) 1 % GEL Gel; Apply 4 g topically in the morning and 4 g at noon and 4 g in the evening and 4 g before bedtime.    Chronic thumb pain, left  -     XR HAND LEFT 2 VIEWS; Future  -     C-REACTIVE PROTEIN  -     RBC SEDIMENTATION RATE  -     diclofenac (VOLTAREN) 1 % GEL Gel; Apply 4 g topically in the morning and 4 g at noon and 4 g in the evening and 4 g before bedtime.    BMI 33.0-33.9,adult  -     THYROID SCREEN TSH REFLEX FT4    Vitamin D deficiency  -     VITAMIN D,25 HYDROXY  -     cholecalciferol (VITAMIN D3) 2000 UNIT tablet; Take 1 tablet by mouth in the morning.    Exertional angina (HCC)  Comments:  Awaiting coronary artery CT later this month, next steps through cardiology pending those results.     Need for prophylactic vaccination and inoculation against influenza  -     IMMUNIZATION ADMIN SINGLE  -     IIV4 VACC PRESERV FREE AGE 65 MONTHS AND OLDER, 0.5ML, IM    Other orders  -     POC IMMUNOASSAY FECAL OCCULT BLOOD TEST; Future         Jonathon Resides, CNP

## 2021-04-19 NOTE — Progress Notes (Signed)
Influenza Vaccine Procedure  April 19, 2021    1. Has the patient received the information for the influenza vaccine? Yes    2. Does the patient have any of the following contraindications?  Allergy to eggs? No  Allergic reaction to previous influenza vaccines? No  Any other problems to previous influenza vaccines? No  Paralyzed by Guillain-Barre syndrome?  No  Current moderate or severe illness? No  Allergy to contact lens solution? No    3. The vaccine has been administered in the usual fashion.     Immunization information reviewed. Current VIS reviewed and given to patient/ guardian. Verbal assent obtained from patient/ guardian.  See immunization/Injection module or chart review for date of publication and additional information. Verbal assent obtained from patient/guardian. Comfort measures for possible side effects reviewed.

## 2021-04-20 ENCOUNTER — Telehealth (HOSPITAL_BASED_OUTPATIENT_CLINIC_OR_DEPARTMENT_OTHER): Payer: Self-pay | Admitting: Registered Nurse

## 2021-04-20 MED ORDER — VITAMIN D 50 MCG (2000 UT) PO TABS
1.0000 | ORAL_TABLET | Freq: Every day | ORAL | 3 refills | Status: DC
Start: 2021-04-20 — End: 2021-08-12

## 2021-04-20 NOTE — Telephone Encounter (Signed)
-----   Message from Jonathon Resides, [place] sent at 04/20/2021  8:05 AM EST -----  Dear RN,    Please:    1. Create Telephone encounter for this patient.  2. Share with the patient that her labs are normal, except she still has low Vitamin D.     Plan:  1. She does not need the high-dose vitamin D she took in the past. I have sent in a prescription for a daily Vit D supplement that she should continue indefinitely.    2. Type of Outreach: 1 phone call and if unable to reach send letter    3. Document the conversation in the Telephone Encounter and close the encounter, no need to send back to me.     Thank you,  Jonathon Resides, CNP

## 2021-04-20 NOTE — Progress Notes (Signed)
Dear RN,    Please:    1. Create Telephone encounter for this patient.  2. Share with the patient that her labs are normal, except she still has low Vitamin D.     Plan:  1. She does not need the high-dose vitamin D she took in the past. I have sent in a prescription for a daily Vit D supplement that she should continue indefinitely.    2. Type of Outreach: 1 phone call and if unable to reach send letter    3. Document the conversation in the Telephone Encounter and close the encounter, no need to send back to me.     Thank you,  Jonathon Resides, CNP

## 2021-04-20 NOTE — Telephone Encounter (Signed)
Spoke with patient, declined interpreter  Relayed results and plan  She will pick up and start today and take 'FOREVER?'   Yes, at least as long as she lives in [place]!  Verbalized back and agrees with plan

## 2021-04-21 ENCOUNTER — Encounter (HOSPITAL_BASED_OUTPATIENT_CLINIC_OR_DEPARTMENT_OTHER): Payer: Self-pay

## 2021-04-29 ENCOUNTER — Other Ambulatory Visit (HOSPITAL_BASED_OUTPATIENT_CLINIC_OR_DEPARTMENT_OTHER): Payer: Self-pay

## 2021-04-29 ENCOUNTER — Other Ambulatory Visit: Payer: Self-pay

## 2021-04-29 ENCOUNTER — Ambulatory Visit
Admission: RE | Admit: 2021-04-29 | Discharge: 2021-04-29 | Disposition: A | Discharge status: Home / Self Care | Payer: No Typology Code available for payment source

## 2021-04-29 ENCOUNTER — Ambulatory Visit (HOSPITAL_BASED_OUTPATIENT_CLINIC_OR_DEPARTMENT_OTHER)
Admission: RE | Admit: 2021-04-29 | Discharge: 2021-04-29 | Disposition: A | Discharge status: Home / Self Care | Payer: No Typology Code available for payment source | Source: Ambulatory Visit

## 2021-04-29 ENCOUNTER — Ambulatory Visit (HOSPITAL_BASED_OUTPATIENT_CLINIC_OR_DEPARTMENT_OTHER)
Admit: 2021-04-29 | Discharge: 2021-04-29 | Disposition: A | Discharge status: Home / Self Care | Payer: No Typology Code available for payment source

## 2021-04-29 DIAGNOSIS — M25562 Pain in left knee: Secondary | ICD-10-CM

## 2021-04-29 DIAGNOSIS — G8929 Other chronic pain: Secondary | ICD-10-CM

## 2021-04-29 DIAGNOSIS — M1812 Unilateral primary osteoarthritis of first carpometacarpal joint, left hand: Secondary | ICD-10-CM

## 2021-04-29 DIAGNOSIS — M79645 Pain in left finger(s): Secondary | ICD-10-CM | POA: Insufficient documentation

## 2021-04-29 DIAGNOSIS — R0989 Other specified symptoms and signs involving the circulatory and respiratory systems: Secondary | ICD-10-CM | POA: Diagnosis not present

## 2021-04-29 DIAGNOSIS — R0609 Other forms of dyspnea: Secondary | ICD-10-CM | POA: Insufficient documentation

## 2021-04-29 LAB — LIPID PANEL
Cholesterol: 216 mg/dL (ref 0–239)
HIGH DENSITY LIPOPROTEIN: 55 mg/dL (ref 40–60)
LOW DENSITY LIPOPROTEIN DIRECT: 144 mg/dL (ref 0–189)
TRIGLYCERIDES: 124 mg/dL (ref 0–150)

## 2021-04-29 LAB — BASIC METABOLIC PANEL
ANION GAP: 10 mmol/L (ref 10–22)
BUN (UREA NITROGEN): 19 mg/dL — ABNORMAL HIGH (ref 7–18)
CALCIUM: 9.9 mg/dL (ref 8.5–10.5)
CARBON DIOXIDE: 28 mmol/L (ref 21–32)
CHLORIDE: 102 mmol/L (ref 98–107)
CREATININE: 0.7 mg/dL (ref 0.4–1.2)
ESTIMATED GLOMERULAR FILT RATE: >60 mL/min (ref >60)
Glucose Random: 85 mg/dL (ref 74–160)
POTASSIUM: 4.9 mmol/L (ref 3.5–5.1)
SODIUM: 139 mmol/L (ref 136–145)

## 2021-04-29 NOTE — Progress Notes (Signed)
Please let her know that while her cholesterol is elevated, her calculated risk of cardiovascular disease over the next 10 yrs is low thus it does not warrant treatment with a cholesterol medication such as statin. She should abide by a low fat low cholesterol diet, work towards weight loss and introduction of routine consistent exercise - Exercise goal: At least 3 hours per week of aerobic exercise (Examples: brisk walking, jogging, running, biking, swimming, etc). Thanks - [name]

## 2021-05-02 ENCOUNTER — Telehealth (HOSPITAL_BASED_OUTPATIENT_CLINIC_OR_DEPARTMENT_OTHER): Payer: Self-pay | Admitting: Registered Nurse

## 2021-05-02 ENCOUNTER — Telehealth (HOSPITAL_BASED_OUTPATIENT_CLINIC_OR_DEPARTMENT_OTHER): Payer: Self-pay

## 2021-05-02 NOTE — Telephone Encounter (Signed)
-----   Message from [name], MD sent at 04/29/2021  7:16 PM EST -----  Please let her know that while her cholesterol is elevated, her calculated risk of cardiovascular disease over the next 10 yrs is low thus it does not warrant treatment with a cholesterol medication such as statin. She should abide by a low fat low cholesterol diet, work towards weight loss and introduction of routine consistent exercise - Exercise goal: At least 3 hours per week of aerobic exercise (Examples: brisk walking, jogging, running, biking, swimming, etc). Thanks - [name]

## 2021-05-02 NOTE — Telephone Encounter (Signed)
Called  Patient left message to  return  Call  To  Clinic.Erskine Speed  RN

## 2021-05-02 NOTE — Telephone Encounter (Signed)
-----   Message from Jonathon Resides, [place] sent at 05/02/2021 12:31 PM EST -----  Dear RN,    Please:    1. Create Telephone encounter for this patient.  2. Share with the patient that the xray of her hand showed arthritis in her thumb joint. The xray of her knee was normal, no arthritis. There is no sign of an inflammatory process or systemic disease.     Plan:  1. She can use the topical diclofenac and oral Tylenol up to 1000 mg TID, as we discussed at her visit. Heat and/or ice for 20 minutes at a time can also help with joint pain as needed.     2. Type of Outreach: 1 phone call and if unable to reach send letter    3. Document the conversation in the Telephone Encounter and close the encounter, no need to send back to me.     Thank you,  Jonathon Resides, CNP

## 2021-05-02 NOTE — Telephone Encounter (Signed)
Pt called  Message Reviewed. All questioned  Answered.[place] interpreter used Name  And  DOB verified. Erskine Speed RN

## 2021-05-02 NOTE — Telephone Encounter (Signed)
Spoke with pt  Informed of xray and lab results and plan  Verbalized understanding  No further questions    Pearla Dubonnet, RN, 05/02/2021

## 2021-05-02 NOTE — Progress Notes (Signed)
Dear RN,    Please:    1. Create Telephone encounter for this patient.  2. Share with the patient that the xray of her hand showed arthritis in her thumb joint. The xray of her knee was normal, no arthritis. There is no sign of an inflammatory process or systemic disease.     Plan:  1. She can use the topical diclofenac and oral Tylenol up to 1000 mg TID, as we discussed at her visit. Heat and/or ice for 20 minutes at a time can also help with joint pain as needed.     2. Type of Outreach: 1 phone call and if unable to reach send letter    3. Document the conversation in the Telephone Encounter and close the encounter, no need to send back to me.     Thank you,  Jonathon Resides, CNP

## 2021-05-05 ENCOUNTER — Encounter (HOSPITAL_BASED_OUTPATIENT_CLINIC_OR_DEPARTMENT_OTHER): Payer: Self-pay | Admitting: Clinical Cardiac Electrophysiology

## 2021-05-05 DIAGNOSIS — Q245 Malformation of coronary vessels: Secondary | ICD-10-CM

## 2021-05-05 DIAGNOSIS — I2089 Other forms of angina pectoris: Secondary | ICD-10-CM

## 2021-05-05 DIAGNOSIS — I471 Supraventricular tachycardia, unspecified: Secondary | ICD-10-CM

## 2021-05-05 DIAGNOSIS — R0609 Other forms of dyspnea: Secondary | ICD-10-CM

## 2021-05-05 DIAGNOSIS — I208 Other forms of angina pectoris: Secondary | ICD-10-CM

## 2021-05-05 NOTE — Progress Notes (Signed)
ABNORMAL CORONARY CTA RESULTS:    Today, I received the results of the coronary CTA done at [facility] on 05/02/2021.    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    Patient informed of the results. She is clinically stable with mild episodes of exertional throat tightness/DOE occurring about 1-2 times per month since March 2022. During this period of time, she had 3 severe episodes but her most recent symptoms have been more mild. No rest symptoms. 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 I advised her to keep her activity to mild levels and that she should call 911 and go to the ED if she develops progressive, severe, or rest symptoms.    I also spoke with Dr. Cena Benton from [facility]. He will connect me with a Cardiologist at [facility] or [facility] who specializes in the area of anomalous coronaries and help coordinate an office consultation in the near future.     Patient is aware and agreeable to seeing a specialist. She will let me know if she does not get contacted for an appt by next week.      Hassell Halim, MD

## 2021-05-06 ENCOUNTER — Encounter (HOSPITAL_BASED_OUTPATIENT_CLINIC_OR_DEPARTMENT_OTHER): Payer: Self-pay

## 2021-05-06 DIAGNOSIS — Q245 Malformation of coronary vessels: Secondary | ICD-10-CM | POA: Insufficient documentation

## 2021-05-09 ENCOUNTER — Encounter (HOSPITAL_BASED_OUTPATIENT_CLINIC_OR_DEPARTMENT_OTHER): Payer: Self-pay | Admitting: Clinical Cardiac Electrophysiology

## 2021-05-09 ENCOUNTER — Other Ambulatory Visit (HOSPITAL_BASED_OUTPATIENT_CLINIC_OR_DEPARTMENT_OTHER): Payer: Self-pay | Admitting: Clinical Cardiac Electrophysiology

## 2021-05-09 DIAGNOSIS — R0609 Other forms of dyspnea: Secondary | ICD-10-CM

## 2021-05-09 DIAGNOSIS — I2089 Other forms of angina pectoris: Secondary | ICD-10-CM

## 2021-05-09 DIAGNOSIS — Q245 Malformation of coronary vessels: Secondary | ICD-10-CM

## 2021-05-09 DIAGNOSIS — I208 Other forms of angina pectoris: Secondary | ICD-10-CM

## 2021-06-22 NOTE — Telephone Encounter (Signed)
A user error has taken place: encounter opened in error, closed for administrative reasons.

## 2021-08-09 ENCOUNTER — Ambulatory Visit
Payer: No Typology Code available for payment source | Attending: Clinical Cardiac Electrophysiology | Admitting: Clinical Cardiac Electrophysiology

## 2021-08-09 ENCOUNTER — Other Ambulatory Visit: Payer: Self-pay

## 2021-08-09 ENCOUNTER — Encounter (HOSPITAL_BASED_OUTPATIENT_CLINIC_OR_DEPARTMENT_OTHER): Payer: Self-pay | Admitting: Clinical Cardiac Electrophysiology

## 2021-08-09 VITALS — BP 97/64 | HR 66 | Temp 96.7°F | Wt 183.0 lb

## 2021-08-09 DIAGNOSIS — I471 Supraventricular tachycardia, unspecified: Secondary | ICD-10-CM

## 2021-08-09 DIAGNOSIS — Q245 Malformation of coronary vessels: Secondary | ICD-10-CM | POA: Insufficient documentation

## 2021-08-09 DIAGNOSIS — I2089 Other forms of angina pectoris: Secondary | ICD-10-CM

## 2021-08-09 DIAGNOSIS — Z6834 Body mass index (BMI) 34.0-34.9, adult: Secondary | ICD-10-CM | POA: Insufficient documentation

## 2021-08-09 DIAGNOSIS — I208 Other forms of angina pectoris: Secondary | ICD-10-CM | POA: Insufficient documentation

## 2021-08-09 NOTE — Progress Notes (Signed)
OUTPATIENT CARDIOLOGY PROGRESS NOTE  Date of visit: 08/09/2021    Patient returns for follow-up in cardiology clinic.      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 At that time, she continued to have occasional exertional SOB associated with throat tightness despite a low risk ETT-Echo. I obtained a coronary CTA at [facility] on 05/04/21 which showed an anomalous RCA originating from the left main coronary artery and with an interarterial course.     She was then referred to Dr. Chalmers Guest at [facility] whom she saw on 05/25/21. After a muti-disciplinary discussion with CT surgery, it was decided that the patient should undergo surgical correction of the anomalous RCA. She has an appt with Dr. [place] Bloom (CT Surgery at [facility] on 08/24/21).    Clinically, she is doing well. Over the past 6 months or so, she has curtailed her activity - she is avoiding heavy lifting and walking slower when climbing stairs thus avoiding recurrent symptoms of DOE/throat tightness.     Two days ago, she was able to climb 68 steps slowly but without stopping and did not have any symptoms of SOB/throat tightness - last severe episode was >6 months ago. No palpitations. No edema. No orthopnea. No LH, syncope, or presyncope.    ROS: Worried about father - He has had progressive difficulty with leg weakness/walking or getting in and out of the car. She also has low back pain.    Current Medications:  cholecalciferol (VITAMIN D3) 2000 UNIT tablet, Take 1 tablet by mouth in the morning., Disp: 90 tablet, Rfl: 3  diclofenac (VOLTAREN) 1 % GEL Gel, Apply 4 g topically in the  morning and 4 g at noon and 4 g in the evening and 4 g before bedtime., Disp: 100 g, Rfl: 3  metoprolol (TOPROL-XL) 100 MG 24 hr tablet, TAKE 1 TABLET BY MOUTH DAILY, Disp: 90 tablet, Rfl: 2  omeprazole (PRILOSEC) 20 MG capsule, TAKE 1 CAPSULE BY MOUTH 2 (TWO) TIMES DAILY BEFORE MEALS, Disp: 60 capsule, Rfl: 0    No current facility-administered medications on file prior to visit.      Review of Patient's Allergies indicates:   Naproxen                Swelling    Comment:Ed visit with angioedema   Ibuprofen               Other (See Comments)    Comment:Epigastric pain, throat swelling?             05/06/12 pt states uses sometimes without issue   Morphine                Other (See Comments)    Comment:"THE LAST TIME I WAS HERE, THEY GAVE IT TO ME AND             TOLD ME TO TELL THE DOCTOR NOT TO GIVE IT TO ME             ANYMORE. IT MADE ME ACT DIFFERENT".   Pollen extract-tree*    Runny Nose    Comment:HA, itchy  watery eyes    FAMILY HISTORY: Father has AFib. Mother is diabetes and has HTN.    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 Lives with adult son, father and mother. No cigs, EtOH, or drugs.     ROS: All other systems were pertinently reviewed and were negative unless otherwise outlined above.    PHYSICAL EXAM:  General: Obese; normal appearance for age; no apparent distress   08/09/21  1027   BP: 97/64   Site: Left Arm   Position: Sitting   Cuff Size: Large   Pulse: 66   Temp: 96.7 F (35.9 C)   TempSrc: Temporal   SpO2: 99%   Weight: 83 kg (183 lb)     HEENT: Oral mucosa is moist, no icterus, no pallor noted.  NECK: Supple. No thyromegaly.  CHEST: Clear to auscultation, no crackles or rhonchi. No wheezes.  HEART: Normal JVP. Carotid pulse are +2 bilaterally with normal upstrokes and no bruits. Regular rate and rhythm. No murmurs, rubs, or gallops.   ABDOMEN: Soft, no organomegaly detected. No abdominal bruits.  EXTREMITIES: Warm and well perfused. Peripheral  pulses are +2 bilaterally in both upper and lower extremities. There is no evidence of peripheral pedal edema.    PSYCHIATRIC: Mood and affect are appropriate.  SKIN: No rash observed.    PERTINENT INVESTIGATIONS:  1. EKG today (on my personal review): Sinus rhythm with sinus arrhythmia at 61 bpm. Normal intervals. Normal QRS axis. Normal tracing. Compared to ECG from 02/08/2021, no significant change.  2. LABS:   Lab Results   Component Value Date    NA 139 04/29/2021    K 4.9 04/29/2021    CL 102 04/29/2021    CO2 28 04/29/2021    BUN 19 (H) 04/29/2021    CREAT 0.7 04/29/2021    GLUCOSER 85 04/29/2021     Lab Results   Component Value Date    LDL 144 04/29/2021    HDL 55 04/29/2021    TG 124 04/29/2021     ECHO 02/22/2016:  1. LV ejection fraction is 60%.    2. There are no regional wall motion abnormalities.    3. Normal study.    4. Compared to the previous study dated 04/18/11, there is no significant change.    30-Day Event Monitor Aug/Sept 2021:  Baseline rhythm at sinus at 76 bpm. There were no symptomatic recordings. There were 8 auto-triggered recordings which showed sinus at 47-138 bpm with no ectopy.    Stress Echo 09/03/2020:  1. Bruce Protocol to 5 minutes and 7 METS, achieving 141 BPM = 85% max predicted. Good double product of 24. Exercise capacity was 95% of normal.   2. 5/10 non-pleuritic throat pressure typical of that of chief complaint developed during exercise and subsided over 5 minutes of recovery.   3. No ST changes developed.   4. LV function was normal by echo at rest and with exertion, with no regional wall motion abnormalities.   5. No evidence of inducible ischemia.    Coronary CTA 05/04/2021 [facility]):  Anomalous RCA which originates from the Left Main coronary artery with an interarterial course. No CAD.    ASSESSMENT:  56 year old woman with preserved LVEF and normal baseline ECG, h/o SVT diagnosed in March 2010 (suppressed on Toprol XL) who was diagnosed with an anomalous RCA on  coronary CTA Jan 2023 (originating from LMCA w/ interarterial course) after she developed increased  DOE/exertional throat tightness since March 2022. She was evaluated by Dr. Leonette Most at [facility] and surgical correction of her RCA was recommended; she is due to see Dr. Wilma Flavin from CT Surgery at [facility] on 08/24/21.    Clinically, she is doing well - she has curtailed some activities and is doing them slower thus avoiding recurrent symptoms of sig DOE/throat tightness.    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 She demonstrates good understanding and all questions answered.  (Z68.34) BMI 34.0-34.9,adult  Plan:   1. Appointment with Dr. Wilma Flavin from CT Surgery at [facility] on 08/24/21.  2. Discussed crucial importance of weight loss in preparation for cardiac surgery - options and strategies discussed.    (I47.1) SVT To 240 bpm (diagnosed March 2010); on Toprol XL; recurrent symptoms relieved w/ vagal maneuvers  Comment: Suspect AVNRT although I cannot retrieve her SVT ECG from March 2010.  Plan:   1. Continue Toprol XL 100 mg qPM.  2. If symptoms progress, will consider SVT ablation.      Return for follow up in Cardiology Clinic in 3 months. To contact earlier if there are any cardiac related issues.  I spent a total of 45 minutes on this visit on the date of service (total time includes all activities performed on the date of service).  I have reviewed the patient's outside hospital records and tests in detail and incorporated the information in my medical decision making today.    This Cardiology Division patient encounter  note was created using voice-recognition software and in real time during the clinic visit. Please excuse any typographical errors that have not been edited out.     Electronically signed by: Lurena Joiner, MD, 08/09/2021 11:10 AM

## 2021-08-09 NOTE — Patient Instructions (Addendum)
Encounter Start Encounter End   08/24/2021  2:45 PM 08/24/2021  3:45 PM     Providers    Gust Rung, Martin[place] P, MD, MPH  Cardiothoracic Surgery  [place] [address]  Phone: [phone]  [email or web address]  Fax: [phone]

## 2021-08-10 NOTE — Addendum Note (Signed)
Addended by: Clemetine Marker on: 08/10/2021 08:25 AM     Modules accepted: Orders

## 2021-08-12 ENCOUNTER — Encounter (HOSPITAL_BASED_OUTPATIENT_CLINIC_OR_DEPARTMENT_OTHER): Payer: Self-pay | Admitting: Physician Assistant

## 2021-08-12 ENCOUNTER — Ambulatory Visit: Payer: No Typology Code available for payment source | Attending: Physician Assistant | Admitting: Physician Assistant

## 2021-08-12 ENCOUNTER — Other Ambulatory Visit: Payer: Self-pay

## 2021-08-12 ENCOUNTER — Telehealth (HOSPITAL_BASED_OUTPATIENT_CLINIC_OR_DEPARTMENT_OTHER): Payer: Self-pay

## 2021-08-12 ENCOUNTER — Ambulatory Visit (HOSPITAL_BASED_OUTPATIENT_CLINIC_OR_DEPARTMENT_OTHER): Payer: No Typology Code available for payment source | Admitting: Physician Assistant

## 2021-08-12 VITALS — BP 120/77 | HR 66 | Temp 96.8°F | Resp 16 | Ht 61.02 in | Wt 184.0 lb

## 2021-08-12 DIAGNOSIS — E559 Vitamin D deficiency, unspecified: Secondary | ICD-10-CM | POA: Insufficient documentation

## 2021-08-12 DIAGNOSIS — Z23 Encounter for immunization: Secondary | ICD-10-CM | POA: Insufficient documentation

## 2021-08-12 DIAGNOSIS — Z124 Encounter for screening for malignant neoplasm of cervix: Secondary | ICD-10-CM

## 2021-08-12 DIAGNOSIS — Z0000 Encounter for general adult medical examination without abnormal findings: Secondary | ICD-10-CM

## 2021-08-12 DIAGNOSIS — Z1211 Encounter for screening for malignant neoplasm of colon: Secondary | ICD-10-CM

## 2021-08-12 DIAGNOSIS — K219 Gastro-esophageal reflux disease without esophagitis: Secondary | ICD-10-CM | POA: Diagnosis present

## 2021-08-12 LAB — CHLAMYDIA GC NAAT
CHLAMYDIA TRACHOMATIS NAAT: NEGATIVE
NEISSERIA GONORRHOEAE NAAT: NEGATIVE

## 2021-08-12 LAB — HEMOGLOBIN A1C
ESTIMATED AVERAGE GLUCOSE: 105 mg/dL (ref 74–160)
HEMOGLOBIN A1C: 5.3 % (ref 4.0–5.6)

## 2021-08-12 LAB — HEPATITIS B SURFACE ANTIGEN: HEPATITIS B SURFACE ANTIGEN: NONREACTIVE

## 2021-08-12 LAB — HEPATITIS B SURFACE AB QUANT: HEPATITIS B SURFACE AB QUANT: <3.5 m[IU]/mL (ref 0.0–8.4)

## 2021-08-12 MED ORDER — OMEPRAZOLE 20 MG PO CPDR
20.0000 mg | DELAYED_RELEASE_CAPSULE | Freq: Every day | ORAL | 3 refills | Status: DC
Start: 2021-08-12 — End: 2022-09-01

## 2021-08-12 MED ORDER — VITAMIN D 50 MCG (2000 UT) PO TABS
1.0000 | ORAL_TABLET | Freq: Every day | ORAL | 3 refills | Status: DC
Start: 2021-08-12 — End: 2022-09-01

## 2021-08-12 NOTE — Telephone Encounter (Signed)
nurse from [facility] called the Central Refill Department to complete a benefit analysis for the shingrix Vaccine.    The vaccine is covered under the patient?s [place] health medical coverage.    Please choose Private

## 2021-08-12 NOTE — Progress Notes (Signed)
Subjective     Deborah [name] is a 56 year old female presents for CPE, needs labs/vaccines for immigration.     On BB h/o afib, cardio following, doing well, no symptoms  Most Recent BP Reading(s)  08/12/21 : 120/77  08/09/21 : 97/64  04/19/21 : 124/77  02/08/21 : 119/79  07/06/20 : 118/78    HEMOGLOBIN A1C (%)   Date Value   02/17/2019 5.0   06/30/2008 5.2   09/20/2005 4.9     Health Maintenance   Preventative Screening   -Physical Exam: 08/12/21   -PAP, Pelvic, breast exam 06/2014, cotest   -Mammogram: 05/25/2020   -Bone Density: age 35   -Colonoscopy: ifob neg 01/2020   -Lipids:   Cholesterol (mg/dL)   Date Value   04/29/2021 216     LOW DENSITY LIPOPROTEIN DIRECT (mg/dL)   Date Value   04/29/2021 144     HIGH DENSITY LIPOPROTEIN (mg/dL)   Date Value   04/29/2021 55     TRIGLYCERIDES (mg/dL)   Date Value   04/29/2021 124          Cardiac Testing   -ECG:    -Stress Test:     Immune Status:  Immunization History   Administered Date(s) Administered   . Covid-19 Vaccine [employer] - Purple Cap) 06/12/2019, 07/05/2019   . 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 H1N1 0.40m Intranasal 03/26/2008   . HEP B ADULT 3 DOSE 20 and > 08/19/2021, 09/20/2021   . INFLUENZA VIRUS TRI W/PRESV VACCINE 18/> YRS IM (PRIVATE) 02/18/2004, 02/13/2005, 03/26/2008, 02/27/2012   . INFLUENZA VIRUS VAC QUAD LIVE INTRANASAL 2-<32YRS 06/30/2014   . Influenza Virus Quad Presv Free Vacc 6 Mo and Older, IM 06/30/2014, 01/31/2017, 04/22/2018, 02/10/2019, 04/21/2021   . Influenza Virus Quad W/Presv Vacc 6 Mo and Older, IM 03/08/2016   . MMR 08/19/2021, 09/20/2021   . Td 02/27/2012   . Tdap 03/08/2016           Patient Active Problem List:     Gastroesophageal reflux disease without esophagitis     Other specified gastritis     Allergy to naproxen     Chondromalacia patellae     Lower back pain     Family history of diabetes mellitus (DM)     Intermittent spinal claudication (HStigler     Vitamin D deficiency     DDD  (degenerative disc disease), lumbar     Bilateral chronic knee pain     SVT To 240 bpm (diagnosed March 2010); on Toprol XL; recurrent symptoms relieved w/ vagal maneuvers     BMI 34.0-34.9,adult     Macular chorioretinal scar of left eye     Hyperopia of both eyes with astigmatism and presbyopia     Osteoarthritis of both knees     Trigger middle finger of right hand     Primary osteoarthritis of first carpometacarpal joint of left hand     Chronic right shoulder pain     History of kidney stones     Age-related nuclear cataract of both eyes     Exertional angina (HCC) (anomalous RCA)     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    Review of Patient's Allergies indicates:   Naproxen                Swelling    Comment:Ed visit with angioedema   Ibuprofen               Other (See Comments)    Comment:Epigastric pain, throat swelling?             05/06/12 pt states uses sometimes without issue   Morphine                Other (See Comments)    Comment:"THE LAST TIME I WAS HERE, THEY GAVE IT TO ME AND             TOLD ME TO TELL THE DOCTOR NOT TO GIVE IT TO ME             ANYMORE. IT MADE ME ACT DIFFERENT".   Pollen extract-tree*    Runny Nose    Comment:HA, itchy watery eyes  Social History     Socioeconomic History   . Marital status: Divorced     Spouse name: Not on file   . Number of children: 1   . Years of  education: Not on file   . Highest education level: Not on file   Occupational History   . 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 Smoking status: Former     Packs/day: 0.50     Years: 27.00     Pack years: 13.50     Types: Cigarettes     Quit date: 06/28/2002     Years since quitting: 19.1   . Smokeless tobacco: Never   Vaping Use   . Vaping status: Not on file   Substance and Sexual Activity   . Alcohol use: No     Alcohol/week: 0.0 standard drinks of alcohol   . Drug use: No   . Sexual activity: Not Currently     Partners: Male     Comment: no STI hx; HIV neg 2002   Other Topics Concern   . Military Service Not Asked   . Blood Transfusions Not Asked   . Caffeine Concern Not Asked   . Occupational Exposure Not Asked   . Hobby Hazards Not Asked   . Sleep Concern Not Asked   . Stress Concern Yes   . Weight Concern Yes   . Special Diet Yes   . Back Care Not Asked   . Exercise No   . Bike Helmet Not Asked   . Seat Belt Yes   . Self-Exams Yes   Social History Narrative    [place], [place]. To [place] 2000.    Lives with her son and her parents. Divorced from husband.    Denies DV. No guns in home.        No regular exercise. Dental care in place.   Social Determinants of Health  Financial Resource Strain: Not on file  Food Insecurity: Not on file  Transportation Needs: Not on file  Physical Activity: Not on  file  Stress: Not on file  Social Connections: Not on file  Intimate Partner Violence: Not on file  Housing Stability: Not on file  Past Surgical History:  11/12/2019: ------------OTHER-------------; Left      Comment:  Left ureteroscopy and laser lithotripsy, ureteral stent                placement, and retrograde pyelography  No date: BREAST MASS EXCISION; Left      Comment:  negative  No date: CESAREAN DELIVERY ONLY      Comment:  breech  No date: [place] BREAST SPECIMEN      Comment:  s/p fibroadenoma on the L,   No date: SEPTOPLASTY/SUBMUCOUS RESECJ W/WO CARTILAGE GRF  Review of patient's  family history indicates:  Problem: Heart      Relation: Father          Age of Onset: (Not Specified)          Comment: arrhythmia  Problem: Hypertension      Relation: Mother          Age of Onset: (Not Specified)  Problem: Hypertension      Relation: Father          Age of Onset: (Not Specified)  Problem: Diabetes      Relation: Mother          Age of Onset: (Not Specified)  Problem: Diabetes      Relation: Maternal Uncle          Age of Onset: (Not Specified)  Problem: Diabetes      Relation: Maternal Uncle          Age of Onset: (Not Specified)  Problem: Glaucoma      Relation: Maternal Uncle          Age of Onset: (Not Specified)  Problem: Heart      Relation: Maternal Grandmother          Age of Onset: (Not Specified)  Problem: Heart      Relation: Maternal Uncle          Age of Onset: (Not Specified)  Problem: Heart      Relation: Maternal Uncle          Age of Onset: (Not Specified)  Problem: Cancer - Other      Relation: FamHxNeg          Age of Onset: (Not Specified)  Problem: Cancer - Breast      Relation: FamHxNeg          Age of Onset: (Not Specified)  Problem: Cancer - Colon      Relation: FamHxNeg          Age of Onset: (Not Specified)  Problem: Cancer - Lung      Relation: FamHxNeg          Age of Onset: (Not Specified)  Problem: Cancer - Ovarian      Relation: FamHxNeg          Age of Onset: (Not Specified)           Objective     All ROS reviewed and discussed and are otherwise negative unless listed above.         PHYSICAL EXAM:  BP 120/77 (Site: LA, Position: Sitting, Cuff Size: Reg)   Pulse 66   Temp 96.8 F (36 C) (Temporal)   Resp 16   Ht 5' 1.02" (1.55 m)   Wt 83.5 kg (184 lb)   LMP 10/17/2006  SpO2 99%   BMI 34.74 kg/m       GENERAL APPEARANCE -  A&Ox3, WDWN, NAD  HEENT - head normocephalic, atraumatic, EOMI, PERRLA, conjunctiva clear bilaterally, no occular discharge. TM bilaterally gray and translucent, without bulging, erythema or exudate. Light reflex visible bilaterally.    NECK - Neck soft, supple. No ant/posterior cervical or supraclavicular LAD .    LUNGS - Lungs CTATB without WRR. Normal inspiratory effort.   CARDIOVASCULAR -  RRR without S3, S4. No murmurs, clicks, gallops or rubs.  ABDOMEN - Abdomen soft, NTND. BS normal. No masses, no hepatosplenomegaly.  GU - no suprapubic TTP or inguinal LAD.  Labia without masses or lesions.  Vaginal mucosa without erythema, edema or mass. Cervix nonfriable without masses or cervical motion tenderness.  No adnexal TTP.  Uterus small without TTP  Back -  No CVA tenderness to percussion  Extrem - no tremor or edema. DTRs + 2 and equal UE and LE bilat. Pulses normal and equal bilat at dorsalis pedis and posterior tibial arteries  SKIN - skin color, texture, temperature, and turgor are normal in the exposed areas without rash or concerning lesions  Neuro- CN II-XII normal and equal bilat.  Gait and station steady. Muscle tone and strength normal and equal bilat.   Psych -  Appears stated age, well groomed. Behavior calm and appropriate. Mood and affect appropriate. Appears to have good judgement and insight.     A/P:  1. Routine physical examination  Screening udpated for age according to pt preference, discussed diet, exercise, sleep, safety.   - CYTOPATH, C/V, THIN LAYER  - OBTAINING SCREEN PAP SMEAR  - HUMAN PAPILLOMAVIRUS (HPV)  - POC IMMUNOASSAY FECAL OCCULT BLOOD TEST; Future  - TREPONEMA PALLIDUM AB IGG  - RUBELLA IGG ANTIBODY  - RUBEOLA IGG ANTIBODY  - MUMPS IGG ANTIBODY  - VARICELLA ZOSTER IGG ANTIBODY  - HEPATITIS B SURFACE AB QUANT  - HEPATITIS B SURFACE ANTIGEN  - HEMOGLOBIN A1C  - CHLAMYDIA GC NAAT    2. Vitamin D deficiency  - cholecalciferol (VITAMIN D3) 2000 UNIT tablet; Take 1 tablet by mouth in the morning.  Dispense: 90 tablet; Refill: 3    3. Gastroesophageal reflux disease, unspecified whether esophagitis present  Pt ed provided and reviewed. Counseled re: low acid diet, small meals, avoid late night eating and tight  clothing.  Start PPI, f/u 1 mo if not improving, sooner if new/worsening symptoms  - omeprazole (PRILOSEC) 20 MG capsule; Take 1 capsule by mouth in the morning.  Dispense: 90 capsule; Refill: 3    4. Colon cancer screening  - POC IMMUNOASSAY FECAL OCCULT BLOOD TEST; Future    5. Need for zoster vaccination  - IMMUNIZATION ADMIN SINGLE      I have reviewed the past medical, surgical, social and family history and updated these sections of EpicCare as relevant. All interim labs, test results, and consult notes were reviewed and discussed with patient. Medications were reconciled during this visit and a current medication list was given to the patient at the end of the visit.      follow up as above, prn with PCP  Ernie Avena, PA-C

## 2021-08-12 NOTE — Progress Notes (Signed)
08/12/2021  VIS given prior to administration and reviewed with the patient and or legal guardian. Patient understands the disease and the vaccine. See immunization/Injection module or chart review for date of publication and additional information.  Merleen Nicely Rodriges, LPN

## 2021-08-14 LAB — EKG

## 2021-08-15 ENCOUNTER — Other Ambulatory Visit (HOSPITAL_BASED_OUTPATIENT_CLINIC_OR_DEPARTMENT_OTHER): Payer: Self-pay

## 2021-08-15 LAB — RUBELLA IGG ANTIBODY: RUBELLA IGG ANTIBODY: 0.7 AI (ref 0.0–0.7)

## 2021-08-15 LAB — RUBEOLA IGG ANTIBODY: RUBEOLA IGG ANTIBODY: 5.9 AI (ref 0.0–0.8)

## 2021-08-15 LAB — VARICELLA ZOSTER IGG ANTIBODY: VARICELLA ZOSTER IGG ANTIBODY: 3.3 AI (ref 0.0–0.8)

## 2021-08-15 LAB — TREPONEMA PALLIDUM AB IGG: TREPONEMA PALLIDUM AB IGG: NONREACTIVE

## 2021-08-15 LAB — MUMPS IGG ANTIBODY: MUMPS IGG ANTIBODY: 1.8 AI (ref 0.0–0.8)

## 2021-08-15 LAB — HUMAN PAPILLOMAVIRUS (HPV): HUMAN PAPILLOMAVIRUS: NEGATIVE

## 2021-08-16 ENCOUNTER — Telehealth (HOSPITAL_BASED_OUTPATIENT_CLINIC_OR_DEPARTMENT_OTHER): Payer: Self-pay

## 2021-08-16 NOTE — Telephone Encounter (Signed)
Spoke with pt    Spoke with immigration doctor needs these:  MMR, needs vaccine  Varicella (has immunity)  Hep B, needs vaccine  covid bivalent     Pt has received 2 non-bivalet covid doses. None showing in the system, pt to bring covid vaccine card during Rn visit on 5/12    Desiree Lucy, RN, 08/16/2021

## 2021-08-16 NOTE — Telephone Encounter (Signed)
-----   Message from Jennette Banker, [place] sent at 08/16/2021  9:02 AM EDT -----  Regarding: Immigration vaccines  Deborah [name] [id number], 56 year old, female    Calls today:  Clinical Questions     Name of person calling [name]     Specific nature of request requesting call back from RN regarding immigration vaccines (pt stated RN told me to call )    Return phone number [phone]    Person calling on behalf of patient: Patient (self)    CALL BACK NUMBER: [phone]  e:    Patient's language of care: [place]    Patient does not need an interpreter.    Patient's PCP: Jonathon Resides, CNP    Primary Care Home Site:  [facility]

## 2021-08-18 LAB — CYTOPATH, C/V, THIN LAYER

## 2021-08-19 ENCOUNTER — Ambulatory Visit: Payer: No Typology Code available for payment source | Admitting: Registered Nurse

## 2021-08-19 ENCOUNTER — Other Ambulatory Visit: Payer: Self-pay

## 2021-08-19 DIAGNOSIS — Z23 Encounter for immunization: Secondary | ICD-10-CM | POA: Insufficient documentation

## 2021-08-19 NOTE — Progress Notes (Signed)
Pt in clinic for vaccines  Reports doing well  Allergies reviewed  Tolerated well  RTC 09/20/21 for 2nd MMR and Hep B    08/19/2021  VIS given prior to administration and reviewed with the patient and or legal guardian. Patient understands the disease and the vaccine. See immunization/Injection module or chart review for date of publication and additional information.  Pearla Dubonnet, RN  [phone] Vaccine Administration:    Patient screened according to guidelines.?Vaccine Information Fact Sheet given prior to administration and all patient/parent?questions addressed. Covid-19 vaccine given without incident.? Possible side effects reviewed.? Return?to clinic?for next?scheduled dose if indicated.??    Pearla Dubonnet, RN

## 2021-08-29 ENCOUNTER — Ambulatory Visit
Admission: RE | Admit: 2021-08-29 | Discharge: 2021-08-29 | Disposition: A | Discharge status: Home / Self Care | Payer: No Typology Code available for payment source | Attending: Specialist | Admitting: Specialist

## 2021-08-29 ENCOUNTER — Other Ambulatory Visit: Payer: Self-pay

## 2021-08-29 DIAGNOSIS — N200 Calculus of kidney: Secondary | ICD-10-CM | POA: Insufficient documentation

## 2021-09-13 ENCOUNTER — Ambulatory Visit (HOSPITAL_BASED_OUTPATIENT_CLINIC_OR_DEPARTMENT_OTHER): Payer: Self-pay | Admitting: Specialist

## 2021-09-16 ENCOUNTER — Other Ambulatory Visit (HOSPITAL_BASED_OUTPATIENT_CLINIC_OR_DEPARTMENT_OTHER): Payer: Self-pay | Admitting: Internal Medicine

## 2021-09-16 NOTE — Telephone Encounter (Signed)
PER Pharmacy, Deborah [name] is a 56 year old female has requested a refill of : METOPROLOL '100MG'$  ER      Last Office Visit: 02/08/2021 with Soundra Pilon  Last Physical Exam: 03/13/2017    There are no preventive care reminders to display for this patient.    Other Med Adult:  Most Recent BP Reading(s)  08/12/21 : 120/77        Cholesterol (mg/dL)   Date Value   04/29/2021 216     LOW DENSITY LIPOPROTEIN DIRECT (mg/dL)   Date Value   04/29/2021 144     HIGH DENSITY LIPOPROTEIN (mg/dL)   Date Value   04/29/2021 55     TRIGLYCERIDES (mg/dL)   Date Value   04/29/2021 124         THYROID SCREEN TSH REFLEX FT4 (uIU/mL)   Date Value   04/19/2021 2.180         TSH (THYROID STIM HORMONE) (uIU/mL)   Date Value   12/22/2010 1.31       HEMOGLOBIN A1C (%)   Date Value   08/12/2021 5.3       No results found for: POCA1C      INR (no units)   Date Value   06/15/2008 1.0 (L)       SODIUM (mmol/L)   Date Value   04/29/2021 139       POTASSIUM (mmol/L)   Date Value   04/29/2021 4.9           CREATININE (mg/dL)   Date Value   04/29/2021 0.7       Documented patient preferred pharmacies:    [facility] [place].  Phone: [phone] Fax: [phone]

## 2021-09-20 ENCOUNTER — Ambulatory Visit: Payer: No Typology Code available for payment source | Admitting: Licensed Practical Nurse

## 2021-09-20 ENCOUNTER — Other Ambulatory Visit: Payer: Self-pay

## 2021-09-20 DIAGNOSIS — Z23 Encounter for immunization: Secondary | ICD-10-CM | POA: Insufficient documentation

## 2021-09-20 NOTE — Progress Notes (Signed)
09/20/2021  VIS given prior to administration and reviewed with the patient and or legal guardian. Patient understands the disease and the vaccine. See immunization/Injection module or chart review for date of publication and additional information.  Darrin Nipper LPN

## 2021-10-04 ENCOUNTER — Ambulatory Visit (HOSPITAL_BASED_OUTPATIENT_CLINIC_OR_DEPARTMENT_OTHER): Payer: Self-pay | Admitting: Specialist

## 2021-11-11 ENCOUNTER — Ambulatory Visit (HOSPITAL_BASED_OUTPATIENT_CLINIC_OR_DEPARTMENT_OTHER): Payer: No Typology Code available for payment source | Admitting: Clinical Cardiac Electrophysiology

## 2021-11-23 ENCOUNTER — Other Ambulatory Visit: Payer: Self-pay

## 2022-01-03 ENCOUNTER — Ambulatory Visit: Payer: No Typology Code available for payment source | Attending: Specialist | Admitting: Urology

## 2022-01-03 ENCOUNTER — Encounter (HOSPITAL_BASED_OUTPATIENT_CLINIC_OR_DEPARTMENT_OTHER): Payer: Self-pay | Admitting: Specialist

## 2022-01-03 ENCOUNTER — Other Ambulatory Visit: Payer: Self-pay

## 2022-01-03 DIAGNOSIS — N200 Calculus of kidney: Secondary | ICD-10-CM | POA: Diagnosis not present

## 2022-01-03 DIAGNOSIS — R3129 Other microscopic hematuria: Secondary | ICD-10-CM | POA: Diagnosis present

## 2022-01-03 LAB — URINALYSIS
BACTERIA: NONE SEEN PER HPF (ref 0–5)
BILIRUBIN, URINE: NEGATIVE
CASTS: NONE SEEN PER LPF
CRYSTALS: NONE SEEN
GLUCOSE, URINE: NEGATIVE MG/DL
KETONE, URINE: NEGATIVE MG/DL
LEUKOCYTE ESTERASE: NEGATIVE
NITRITE, URINE: NEGATIVE
PH URINE: 7 (ref 5.0–8.0)
PROTEIN, URINE: NEGATIVE MG/DL
SPECIFIC GRAVITY URINE: 1.004 (ref 1.003–1.035)
WHITE BLOOD CELLS URINE: NONE SEEN PER HPF (ref 0–4)

## 2022-01-03 LAB — POC URINALYSIS
BILIRUBIN, URINE: NEGATIVE
GLUCOSE,URINE: NEGATIVE
KETONE, URINE: NEGATIVE
LEUKOCYTE ESTERASE: NEGATIVE
NITRITE, URINE: NEGATIVE
PH URINE: 6.5 (ref 5.0–8.0)
PROTEIN, URINE: NEGATIVE
SPECIFIC GRAVITY, URINE: <=1.005 (ref 1.003–1.030)
UROBILINOGEN URINE: 0.2 (ref 0.2–1.0)

## 2022-01-03 NOTE — Progress Notes (Signed)
CC: hx of  kidney stones    56 yo female hx laser URS with Dr Gwinda Passe 11/11/19.    Ca Ox stone.     CT 11/2019: no other stones viewable.     Has passed a stone previously on her own.      Last US renal neg.      No renal colic  No gross hematuria  No dysuria    Fluid intake: around 4 bottles water/d.     She had renal US and is here for followup.    REVIEW OF SYSTEMS:  CONSTITUTIONAL: Negative for any fever, weight loss or weight gain.  HEENT: No visual problems. No sore throat.   CARDIOVASCULAR: No chest pain/palpitation.  PULMONARY: Denies any wheeze. No SOB  GASTROINTESTINAL: Denies any nausea, vomiting. No constipation.  GENITOURINARY: Voiding as above in HPI.  NEUROLOGIC: No mental status changes or visual changes. No seizures or paresthesias. No neurologic changes.  ENDOCRINE: No diabetes. no thyroid disease.  SKIN: Denies any skin lesions. No rashes.  PSYCHIATRIC: no psychiatric illness.  MUSCULOSKETAL:  Normal gait.       Past Medical History: reviewed, and no sig changes.   Past Family History: reviewed, and no sig changes.  Social History: reviewed, and no sig changes.  Medications: list reviewed, and without any sig interactions/side effects, unless as noted above in HPI.      PHYSICAL EXAMINATION:  GENERAL: The patient is healthy-appearing. Well developed. Well nourished. No acute distress.  SKIN: No rashes. No lesions.  HEENT: Atraumatic, normocephalic. Tongue midline. Conjunctivae and sclerae are clear. Extraocular movements are full. Oral mucosa normal. Pharynx is negative.  NECK: Supple. There is no lymphadenopathy. No masses are noted. Trachea is midline.   LUNGS: Normal respiratory effort. No wheezing.  CARDIOVASCULAR: Regular pulses in upper extremities.   ABDOMEN: No organomegaly. No masses. No tenderness. No CVA tenderness.  NEUROLOGICAL: Alert and oriented. Good gait and balance.      Renal US  IMPRESSION:     1. Unremarkable ultrasound appearance of the kidneys and bladder. No hydronephrosis or  visible renal calculus.     2. Echogenic liver suggests hepatic steatosis. Co-existing hepatic fibrosis or cirrhosis cannot be excluded by sonographic imaging. [name]       US Renal  Narrative:   CLINICAL INDICATION: 56 years-old Female presents for follow-up of renal stones     COMPARISON: 08/09/2020 through 11/12/2019    TECHNIQUE:  Renal ultrasound was performed with a curved array transducer in real-time. Static images were obtained.    FINDINGS:    Right kidney: Measures 10.7 cm in sagittal dimension. There is no hydronephrosis. No calculi are appreciated. The cortical echotexture appears unremarkable.     Left kidney: Measures 12 cm in sagittal dimension. There is no hydronephrosis. No calculi are appreciated. The cortical echotexture appears unremarkable.    Bladder: Incompletely distended and grossly unremarkable.    Ureteral jets: Not visualized.    Additional findings: None.  Impression: Unremarkable renal ultrasound.     Reviewed and Electronically Signed By: Ralene Bathe, MD  Signed Date and Time: 08/29/2021 6:02 PM           COLOR (no units)   Date Value   01/03/2022 YELLOW   12/30/2019 YELLOW     CLARITY (no units)   Date Value   01/03/2022 CLEAR   12/30/2019 CLEAR     GLUCOSE, URINE (MG/DL)   Date Value   12/30/2019 NEGATIVE     GLUCOSE,URINE (no units)  Date Value   01/03/2022 NEGATIVE     BILIRUBIN, URINE (no units)   Date Value   01/03/2022 NEGATIVE   12/30/2019 NEGATIVE     KETONE, URINE   Date Value   01/03/2022 NEGATIVE   12/30/2019 NEGATIVE MG/DL     No components found for: UASGU  OCCULT BLOOD, URINE (no units)   Date Value   01/03/2022 TRACE-INTACT (A)   12/30/2019 TRACE (A)     PH URINE (no units)   Date Value   01/03/2022 6.5   12/30/2019 5.5     PROTEIN, URINE   Date Value   01/03/2022 NEGATIVE   12/30/2019 NEGATIVE MG/DL     NITRITE, URINE (no units)   Date Value   01/03/2022 NEGATIVE   12/30/2019 NEGATIVE     LEUKOCYTE ESTERASE (no units)   Date Value   04/20/1999 NEGATIVE     MICROSCOPIC (no  units)   Date Value   12/30/2019 SEE RESULTS (A)     WHITE BLOOD CELLS URINE (PER HPF)   Date Value   12/30/2019 NONE SEEN     RED BLOOD CELLS URINE (PER HPF)   Date Value   12/30/2019 RARE 0-2     BACTERIA (PER HPF)   Date Value   12/30/2019 NONE SEEN     CRYSTALS (no units)   Date Value   12/30/2019 NONE SEEN     CASTS (PER LPF)   Date Value   12/30/2019 NONE SEEN     SQUAMOUS EPITHELIAL CELLS (PER LPF)   Date Value   12/30/2019 0-4     No components found for: UATRA  RENAL EPITHELIAL CELLS (PER LPF)   Date Value   11/03/2019 NONE SEEN     No results found for: UAAMM  No results found for: UAAMO  No results found for: UACAC  No results found for: UACAO  No results found for: UACAP  No results found for: UATRI  No results found for: UAUAC  No results found for: UACGC  No results found for: UAFGC  No results found for: UAHYA  No results found for: [facility]  No results found for: UAWAX  No results found for: UAWCC  No results found for: UAMUC  No results found for: UAYEA  No results found for: UACOM    CALCIUM (mg/dL)   Date Value   04/29/2021 9.9   07/06/2020 9.1   12/11/2019 9.2           56 yo female hx kidney stones.   [place]  x 2 without any renal issues.    Option to follow up prn given no new stones.     Pt would wish to check a 24 h urine first, prior to discharge.     Plan for 24 h urine.     Rtc for follow up in 3 months for results review.

## 2022-01-27 ENCOUNTER — Encounter (HOSPITAL_BASED_OUTPATIENT_CLINIC_OR_DEPARTMENT_OTHER): Payer: Self-pay | Admitting: Clinical Cardiac Electrophysiology

## 2022-01-27 ENCOUNTER — Ambulatory Visit
Payer: No Typology Code available for payment source | Attending: Clinical Cardiac Electrophysiology | Admitting: Clinical Cardiac Electrophysiology

## 2022-01-27 ENCOUNTER — Other Ambulatory Visit: Payer: Self-pay

## 2022-01-27 VITALS — BP 122/81 | HR 63 | Temp 96.3°F | Wt 187.0 lb

## 2022-01-27 DIAGNOSIS — Q245 Malformation of coronary vessels: Secondary | ICD-10-CM | POA: Diagnosis present

## 2022-01-27 DIAGNOSIS — Z6835 Body mass index (BMI) 35.0-35.9, adult: Secondary | ICD-10-CM | POA: Diagnosis not present

## 2022-01-27 DIAGNOSIS — Z6834 Body mass index (BMI) 34.0-34.9, adult: Secondary | ICD-10-CM

## 2022-01-27 DIAGNOSIS — I471 Supraventricular tachycardia, unspecified: Secondary | ICD-10-CM | POA: Diagnosis not present

## 2022-01-27 DIAGNOSIS — I2089 Other forms of angina pectoris: Secondary | ICD-10-CM | POA: Diagnosis present

## 2022-01-27 NOTE — Progress Notes (Signed)
OUTPATIENT CARDIOLOGY PROGRESS NOTE  Date of visit: 01/27/2022    Patient returns for follow-up in cardiology clinic.      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    She saw Dr. 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 Her symptoms do sound real despite the fact that they did not correlate with any frank signs of ischemia by stress echo. She is also having a significant amount of difficulty worrying about sudden cardiac death. Looking at the CT scan there appears to be a long intramural course and quite a slitlike opening for a large dominant right coronary artery. Symptoms tend to appear with age given small amounts of aortic growth and thinning of the aortic wall tissue which can translate to compression. I would recommend unroofing. We had a long discussion about what unroofing a coronary artery entails. The patient is nervous about surgery but would like this problem to be behind her.    Risks and benefits of surgery discussed by Dr. Martinique with patient extensively. The recovery after heart surgery including expectations for the post-operative in- and out-patient setting including the significant activity limitations for the first 6-12 weeks. The patient is not ready to book surgery at this time. I asked her to call the office if and when she becomes ready."    She is still very afraid to undergo cardiac surgery. She understands the risk of sudden death without the surgery but she is not yet mentally prepared to  scheduled it.     On 12/11/21: At rest, she developed recurrent SVT lasted <1-2 minutes resolved with vagal maneuvers. That was her only episode in ~1 year.    Climbing stairs slower and has been able to avoid throat pressure. Gets some SOB after walking up 1-2 FOS but no further throat pain/tightness. B knee (L>R) pain also limits her.    Patient denies lightheadedness, syncope, presyncope, significant edema, orthopnea, paroxysmal nocturnal dyspnea, or claudication.     Going to [place] for 1 month in Jan 2024.    Current Medications:  metoprolol (TOPROL-XL) 100 MG 24 hr tablet, Take 1 tablet by mouth in the morning., Disp: 90 tablet, Rfl: 3  cholecalciferol (VITAMIN D3) 2000 UNIT tablet, Take 1 tablet by mouth in the morning., Disp: 90 tablet, Rfl: 3  omeprazole (PRILOSEC) 20 MG capsule, Take 1 capsule by mouth in the morning., Disp: 90 capsule, Rfl: 3  diclofenac (VOLTAREN) 1 % GEL Gel, Apply 4 g topically in the morning and 4 g at noon and 4 g in the evening and 4 g before bedtime., Disp: 100 g, Rfl: 3    No current facility-administered medications on file prior to visit.      Review of Patient's Allergies indicates:   Naproxen                Swelling    Comment:Ed visit with angioedema   Ibuprofen               Other (See Comments)    Comment:Epigastric pain,  throat swelling?             05/06/12 pt states uses sometimes without issue   Morphine                Other (See Comments)    Comment:"THE LAST TIME I WAS HERE, THEY GAVE IT TO ME AND             TOLD ME TO TELL THE DOCTOR NOT TO GIVE IT TO ME             ANYMORE. IT MADE ME ACT DIFFERENT".   Pollen extract-tree*    Runny Nose    Comment:HA, itchy watery eyes    FAMILY HISTORY: Father has AFib. Mother is diabetes and has HTN.     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 Lives with adult son, father and mother. No cigs, EtOH, or drugs.     ROS: All other systems were pertinently reviewed and were negative unless  otherwise outlined above.    PHYSICAL EXAM:  General: Obese; normal appearance for age; no apparent distress   01/27/22  1030   BP: 122/81   Site: Left Arm   Position: Sitting   Cuff Size: Large   Pulse: 63   Temp: 96.3 F (35.7 C)   TempSrc: Temporal   SpO2: 98%   Weight: 84.8 kg (187 lb)     HEENT: Oral mucosa is moist, no icterus, no pallor noted.  NECK: Supple. No thyromegaly.  CHEST: Clear to auscultation, no crackles or rhonchi. No wheezes.  HEART: Normal JVP. Carotid pulse are +2 bilaterally with normal upstrokes and no bruits. Regular rate and rhythm. No murmurs, rubs, or gallops.   ABDOMEN: Soft, no organomegaly detected. No abdominal bruits.  EXTREMITIES: Warm and well perfused. Peripheral pulses are +2 bilaterally in both upper and lower extremities. There is no evidence of peripheral edema.    PSYCHIATRIC: Mood and affect are appropriate.  SKIN: No rash observed.    PERTINENT INVESTIGATIONS:  1. EKG today (on my personal review): Sinus rhythm with sinus arrhythmia at 57 bpm. Normal intervals. Normal QRS axis. Normal tracing. Compared to ECG from 08/09/2021, no significant change.  2. LABS:   Lab Results   Component Value Date    NA 139 04/29/2021    K 4.9 04/29/2021    CL 102 04/29/2021    CO2 28 04/29/2021    BUN 19 (H) 04/29/2021    CREAT 0.7 04/29/2021    GLUCOSER 85 04/29/2021     Lab Results   Component Value Date    LDL 144 04/29/2021    HDL 55 04/29/2021    TG 124 04/29/2021     ECHO 02/22/2016:  1. LV ejection fraction is 60%.     2. There are no regional wall motion abnormalities.     3. Normal study.     4. Compared to the previous study dated 04/18/11, there is no significant change.     30-Day Event Monitor Aug/Sept 2021:  Baseline rhythm at sinus at 76 bpm. There were no symptomatic recordings. There were 8 auto-triggered recordings which showed sinus at 47-138 bpm with no ectopy.     Stress Echo 09/03/2020:  1. Bruce Protocol to 5 minutes and 7 METS, achieving 141 BPM = 85% max predicted.  Good double product of 24. Exercise capacity was 95% of normal.   2. 5/10 non-pleuritic throat pressure typical of that of chief  complaint developed during exercise and subsided over 5 minutes of recovery.   3. No ST changes developed.   4. LV function was normal by echo at rest and with exertion, with no regional wall motion abnormalities.   5. No evidence of inducible ischemia.     Coronary CTA 05/04/2021 [facility]):  Anomalous RCA which originates from the Left Main coronary artery with an interarterial course. No CAD.     ASSESSMENT:  55 year old woman with preserved LVEF and normal baseline ECG, h/o SVT diagnosed in March 2010 (suppressed on Toprol XL) who was diagnosed with an anomalous RCA on coronary CTA Jan 2023 (originating from LMCA w/ interarterial course) after she developed increased DOE/exertional throat tightness since March 2022. She was evaluated by Dr. Alver Fisher from Cardiology at [facility] and by Dr. Gust Rung by CT surgery at [facility] in May 2023 and surgical correction of her RCA was recommended. While she understands the risk of sudden death with her condition, she is afraid of having surgery and is not yet mentally ready to scheduled it.     Clinically, she is doing well - she has curtailed some activities and is doing them slower thus avoiding recurrent symptoms of sig DOE/throat tightness.    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 She demonstrates good understanding and all questions answered. She is currently not mentally ready to schedule it.  (Z68.34) BMI 34.0-34.9,adult  The 10-year ASCVD risk score (Arnett DK, et al., 2019) is: 2.4%    Values used to calculate the score:      Age: 79 years       Sex: Female      Is Non-Hispanic African American: No      Diabetic: No      Tobacco smoker: No      Systolic Blood Pressure: 161 mmHg      Is BP treated: No      HDL Cholesterol: 55 mg/dL      Total Cholesterol: 216 mg/dL    Plan:   1. She will continue to think about Cardiac Surgery but not currently ready to schedule it.  2. She knows to call 911 for any progressive symptoms of angina, SOB, syncope, or presyncope.  3. Discussed crucial importance of weight loss to diminish the stress on her heart and for her overall improved health.    (I47.1) SVT To 240 bpm (diagnosed March 2010); on Toprol XL; recurrent symptoms relieved w/ vagal maneuvers  Comment: Suspect AVNRT although I cannot retrieve her SVT ECG from March 2010.  Plan:   1. Continue Toprol XL 100 mg qPM.  2. If symptoms progress, will consider SVT ablation.      Return for follow up in Cardiology Clinic in 4 months. To contact earlier if there are any cardiac related issues.  I spent a total of 37 minutes on this visit on the date of service (total time includes all activities performed on the date of service).  I have reviewed the patient's outside hospital records and tests in detail and incorporated the information in my medical decision making today.    This Cardiology Division patient encounter note was created using voice-recognition software and in real time during the clinic visit. Please  excuse any typographical errors that have not been edited out.     Electronically signed by: [name], MD, 01/28/2022 2:48 PM

## 2022-01-28 ENCOUNTER — Encounter (HOSPITAL_BASED_OUTPATIENT_CLINIC_OR_DEPARTMENT_OTHER): Payer: Self-pay | Admitting: Clinical Cardiac Electrophysiology

## 2022-01-29 LAB — EKG

## 2022-02-03 ENCOUNTER — Other Ambulatory Visit: Payer: Self-pay

## 2022-02-03 ENCOUNTER — Ambulatory Visit
Admission: RE | Admit: 2022-02-03 | Discharge: 2022-02-03 | Disposition: A | Discharge status: Home / Self Care | Payer: No Typology Code available for payment source | Attending: Clinical Cardiac Electrophysiology | Admitting: Clinical Cardiac Electrophysiology

## 2022-02-03 DIAGNOSIS — I471 Supraventricular tachycardia, unspecified: Secondary | ICD-10-CM | POA: Diagnosis not present

## 2022-03-07 ENCOUNTER — Telehealth (HOSPITAL_BASED_OUTPATIENT_CLINIC_OR_DEPARTMENT_OTHER): Payer: Self-pay

## 2022-03-07 NOTE — Telephone Encounter (Signed)
Called patient, no answer. LVM to call office back.        Joventino, Hervey Ard., MD  [facility]  Please let her know that her monitor looked OK. She had no concerning arrhythmias. She had a very short run of her known SVT lasting only a few seconds which seemed asymptomatic. Continue current medications.

## 2022-03-08 NOTE — Telephone Encounter (Signed)
Spoke  with  patient  .  Name  and  DOB  verified.  Message  reviewed  about  monitor  results.  [place]  interpreter  used  [employer]  [phone]   [place].  Erskine Speed  RN

## 2022-03-14 ENCOUNTER — Other Ambulatory Visit (HOSPITAL_BASED_OUTPATIENT_CLINIC_OR_DEPARTMENT_OTHER): Payer: Self-pay

## 2022-03-14 DIAGNOSIS — G8929 Other chronic pain: Secondary | ICD-10-CM

## 2022-03-14 NOTE — Telephone Encounter (Signed)
PER Pharmacy, Deborah [name] is a 56 year old female has requested a refill of diclofenac gel.      Last Office Visit: 08/12/2021 with Berlinda Last  Last Physical Exam: 08/12/2021    There are no preventive care reminders to display for this patient.    Other Med Adult:  Most Recent BP Reading(s)  01/27/22 : 122/81        Cholesterol (mg/dL)   Date Value   04/29/2021 216     LOW DENSITY LIPOPROTEIN DIRECT (mg/dL)   Date Value   04/29/2021 144     HIGH DENSITY LIPOPROTEIN (mg/dL)   Date Value   04/29/2021 55     TRIGLYCERIDES (mg/dL)   Date Value   04/29/2021 124         THYROID SCREEN TSH REFLEX FT4 (uIU/mL)   Date Value   04/19/2021 2.180         TSH (THYROID STIM HORMONE) (uIU/mL)   Date Value   12/22/2010 1.31       HEMOGLOBIN A1C (%)   Date Value   08/12/2021 5.3       No results found for: "POCA1C"      INR (no units)   Date Value   06/15/2008 1.0 (L)       SODIUM (mmol/L)   Date Value   04/29/2021 139       POTASSIUM (mmol/L)   Date Value   04/29/2021 4.9           CREATININE (mg/dL)   Date Value   04/29/2021 0.7       Documented patient preferred pharmacies:    [facility], [place] [facility] [place].  Phone: [phone] Fax: [phone]

## 2022-04-04 ENCOUNTER — Ambulatory Visit (HOSPITAL_BASED_OUTPATIENT_CLINIC_OR_DEPARTMENT_OTHER): Payer: No Typology Code available for payment source | Admitting: Specialist

## 2022-06-06 ENCOUNTER — Ambulatory Visit (HOSPITAL_BASED_OUTPATIENT_CLINIC_OR_DEPARTMENT_OTHER): Payer: No Typology Code available for payment source | Admitting: Clinical Cardiac Electrophysiology

## 2022-07-18 ENCOUNTER — Ambulatory Visit (HOSPITAL_BASED_OUTPATIENT_CLINIC_OR_DEPARTMENT_OTHER): Payer: No Typology Code available for payment source | Admitting: Clinical Cardiac Electrophysiology

## 2022-08-14 ENCOUNTER — Ambulatory Visit: Payer: 59 | Attending: Family Medicine

## 2022-08-14 ENCOUNTER — Other Ambulatory Visit: Payer: Self-pay

## 2022-08-14 VITALS — BP 110/76 | HR 90 | Wt 192.0 lb

## 2022-08-14 DIAGNOSIS — Z1212 Encounter for screening for malignant neoplasm of rectum: Secondary | ICD-10-CM | POA: Insufficient documentation

## 2022-08-14 DIAGNOSIS — Z833 Family history of diabetes mellitus: Secondary | ICD-10-CM | POA: Diagnosis present

## 2022-08-14 DIAGNOSIS — R5383 Other fatigue: Secondary | ICD-10-CM | POA: Diagnosis present

## 2022-08-14 DIAGNOSIS — E559 Vitamin D deficiency, unspecified: Secondary | ICD-10-CM | POA: Insufficient documentation

## 2022-08-14 DIAGNOSIS — M2550 Pain in unspecified joint: Secondary | ICD-10-CM | POA: Diagnosis not present

## 2022-08-14 DIAGNOSIS — K219 Gastro-esophageal reflux disease without esophagitis: Secondary | ICD-10-CM | POA: Insufficient documentation

## 2022-08-14 DIAGNOSIS — Z6835 Body mass index (BMI) 35.0-35.9, adult: Secondary | ICD-10-CM | POA: Insufficient documentation

## 2022-08-14 DIAGNOSIS — L259 Unspecified contact dermatitis, unspecified cause: Secondary | ICD-10-CM | POA: Diagnosis present

## 2022-08-14 DIAGNOSIS — R5381 Other malaise: Secondary | ICD-10-CM | POA: Insufficient documentation

## 2022-08-14 DIAGNOSIS — Z1231 Encounter for screening mammogram for malignant neoplasm of breast: Secondary | ICD-10-CM | POA: Diagnosis not present

## 2022-08-14 DIAGNOSIS — Z1211 Encounter for screening for malignant neoplasm of colon: Secondary | ICD-10-CM | POA: Diagnosis not present

## 2022-08-14 DIAGNOSIS — Z0000 Encounter for general adult medical examination without abnormal findings: Secondary | ICD-10-CM | POA: Diagnosis not present

## 2022-08-14 LAB — CBC WITH PLATELET
ABSOLUTE NRBC COUNT: 0 10*3/uL (ref 0.0–0.0)
HEMATOCRIT: 40.8 % (ref 34.1–44.9)
HEMOGLOBIN: 13.1 g/dL (ref 11.2–15.7)
MEAN CORP HGB CONC: 32.1 g/dL (ref 31.0–37.0)
MEAN CORPUSCULAR HGB: 28.5 pg (ref 26.0–34.0)
MEAN CORPUSCULAR VOL: 88.9 fl (ref 80.0–100.0)
MEAN PLATELET VOLUME: 10.2 fL (ref 8.7–12.5)
NRBC %: 0 % (ref 0.0–0.0)
PLATELET COUNT: 240 10*3/uL (ref 150–400)
RBC DISTRIBUTION WIDTH STD DEV: 39.8 fL (ref 35.1–46.3)
RED BLOOD CELL COUNT: 4.59 M/uL (ref 3.90–5.20)
WHITE BLOOD CELL COUNT: 9.8 10*3/uL (ref 4.0–11.0)

## 2022-08-14 LAB — BASIC METABOLIC PANEL
ANION GAP: 13 mmol/L (ref 10–22)
BUN (UREA NITROGEN): 16 mg/dL (ref 7–18)
CALCIUM: 9.9 mg/dL (ref 8.5–10.5)
CARBON DIOXIDE: 26 mmol/L (ref 21–32)
CHLORIDE: 103 mmol/L (ref 98–107)
CREATININE: 0.8 mg/dL (ref 0.4–1.2)
ESTIMATED GLOMERULAR FILT RATE: >60 mL/min (ref >60)
Glucose Random: 91 mg/dL (ref 74–160)
POTASSIUM: 4.5 mmol/L (ref 3.5–5.1)
SODIUM: 141 mmol/L (ref 136–145)

## 2022-08-14 LAB — HEMOGLOBIN A1C
ESTIMATED AVERAGE GLUCOSE: 111 mg/dL (ref 74–160)
HEMOGLOBIN A1C: 5.5 % (ref 4.0–5.6)

## 2022-08-14 LAB — THYROID SCREEN TSH REFLEX FT4: THYROID SCREEN TSH REFLEX FT4: 2.28 u[IU]/mL (ref 0.270–4.200)

## 2022-08-14 LAB — RBC SEDIMENTATION RATE: RBC SEDIMENTATION RATE: 18 MM/HR (ref 0–30)

## 2022-08-14 LAB — VITAMIN D,25 HYDROXY: VITAMIN D,25 HYDROXY: 32 ng/mL (ref 30.0–100.0)

## 2022-08-14 LAB — C-REACTIVE PROTEIN: C-REACTIVE PROTEIN: 9 mg/L (ref 0–18)

## 2022-08-14 LAB — VITAMIN B12: VITAMIN B12: 962 pg/mL (ref 232–1245)

## 2022-08-14 MED ORDER — TRIAMCINOLONE ACETONIDE 0.5 % EX CREA
TOPICAL_CREAM | Freq: Three times a day (TID) | CUTANEOUS | 0 refills | Status: DC
Start: 2022-08-14 — End: 2023-03-05

## 2022-08-14 NOTE — Progress Notes (Signed)
HPI  Deborah [name] is a 57 year old female here for preventive exam and f/u medical issues.    Health maintenance:  Mammogram  is due  Cologuard due  Pap smear up to date    UTD cardiology surveillance for h/o SVT, continues on Toprol.     Concerns today:  #polymyalgia - Worsening joint pains x years in her knees. Worse than ever now. Now also has lower back pain, R shoulder, base of both thumbs. "Insupportable" pain sometimes. 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 Denies joint swelling, redness, heat, or tenderness to touch. Denies fever, rash. Takes Tylenol prn, one or two pills of 500 mg pills per day for, helps temporarily. Avoids ibuprofen d/t stomach pain, reflux.     #GERD - aware of swallowing food all the way to stomach, chest pressure when swallowing. Takes omeprazole infrequently with good effect.    #dry skin to hairline, tips of fingers, skin of heels. Worse in winter. Uses Aveeno cream moisturizer.    #c/o few episodes of feeling profoundly tired. Most recently woke up feeling fine last week, had toast and an egg, went to work, but suddenly felt extremely weak and sleepy when she got there, friend had to take her home. Denies trouble speaking or completing a thought, facial or limb weakness. Felt that way from 8 am until 2 pm, improved after eating a sandwich and having a Coke. This has been the pattern the other times it happened. Checked her blood sugar yesterday, when she was feeling well: 109. No missed doses of metoprolol, takes every day at same time. Denies taking any sleep aids.     Current Outpatient Medications:     metoprolol (TOPROL-XL) 100 MG 24 hr tablet, Take 1 tablet by mouth in the morning., Disp: 90 tablet, Rfl: 3    cholecalciferol (VITAMIN D3) 2000 UNIT tablet, Take 1 tablet by mouth in the morning., Disp: 90 tablet, Rfl: 3    omeprazole (PRILOSEC) 20 MG capsule, Take 1 capsule by mouth in the morning., Disp: 90 capsule, Rfl: 3    Review of Patient's Allergies indicates:    Naproxen                Swelling    Comment:Ed visit with angioedema   Ibuprofen               Other (See Comments)    Comment:Epigastric pain, throat swelling?             05/06/12 pt states uses sometimes without issue   Morphine                Other (See Comments)    Comment:"THE LAST TIME I WAS HERE, THEY GAVE IT TO ME AND             TOLD ME TO TELL THE DOCTOR NOT TO GIVE IT TO ME             ANYMORE. IT MADE ME ACT DIFFERENT".   Pollen extract-tree*    Runny Nose    Comment:HA, itchy watery eyes    Past Medical History:  04/04/2006: Abdominal pain, epigastric      Comment:  Nml LFTs, Hep A/B/C serologies neg; RUQ U/S shows no                hepatobiliary abnormalities (completely normal)  Seen by                GI 3/08 with evaluation  pending  12/24/2020: Age-related nuclear cataract of both eyes      Comment:  Mild, early   06/30/2014: Bilateral arm pain  03/15/2015: Chronic right-sided low back pain with right-sided sciatica  02/12/2019: Covid Infection March 2021 -- Ill But Not Hosp'd.      Comment:  Risk category:   Low Tested? Positive Community managed:               Principal risk manager: Requires additional outreach: No                Risk factors: Obesity, latinX  Clinical Course First date               of symptoms:    06/18/19 06/27/19: (DOI 9): CM Result call               - tested positive at outside location (3/15). Overall her               sx have greatly improved, main complaint is nasal                congestion. Previously had sore throat, fevers, and cough  02/22/2008: CTS (Carpal Tunnel Syndrome), b/l      Comment:  Seen by ortho 02/21/08, referred back to physiatry for                EMG  02/22/2008: De Quervain's Tenosynovitis, R      Comment:  Seen by ortho 02/21/08 referred to OT/splinting  No date: Esophageal reflux  10/07/2008: Greater Trochanteric Bursitis, b/l      Comment:  Seen by physiatry with L steroid injection  No date: Heart disease  05/18/2017: Hyperopia of both eyes with  astigmatism and presbyopia  No date: Irregular menstrual cycle  06/06/2017: Lateral epicondylitis of right elbow  11/12/2019: Left ureteral stone  11/14/2006: Leg pain      Comment:  Intermittent, with fatigue, subj weakness; elevated ESR,               but nml CRP/TSH/Ferritin/ANA  Seen by physiatry 09/28/08                with MRI pending  02/25/2013: Lump of skin of lower extremity      Comment:  Dermatofibroma R foot on biopsy  11/10/2004: Lump or mass in breast  05/18/2017: Macular chorioretinal scar of left eye      Comment:  Temporal to fovea, left eye  07/27/2006: Plantar Fasciitis, L      Comment:  S/p cortisone injxn with podiatry 07/25/06  06/30/2014: Positive occult stool blood test  No date: Pregnant state, incidental      Comment:  c/s breech  06/06/2017: Right shoulder injury  08/08/2007: Right subacromial bursitis/rotator cuff tendinopathy      Comment:  S/p steroid injection with physiatry 07/08/08  3/10: SVT (supraventricular tachycardia)      Comment:  [facility]  08/08/2007: Triggering of Finger, R 3rd      Comment:  Seen by Dr. Olam Idler 02/21/08 s/p steroid injection; again               10/07/08    Patient Active Problem List:     Gastroesophageal reflux disease     Other specified gastritis     Allergy to naproxen     Chondromalacia patellae     Lower back pain     Family history of diabetes mellitus (DM)     Intermittent spinal claudication (HCC)  Vitamin D deficiency     DDD (degenerative disc disease), lumbar     Bilateral chronic knee pain     SVT To 240 bpm (diagnosed March 2010); on Toprol XL; recurrent symptoms relieved w/ vagal maneuvers     BMI 35.0-35.9,adult     Macular chorioretinal scar of left eye     Hyperopia of both eyes with astigmatism and presbyopia     Osteoarthritis of both knees     Trigger middle finger of right hand     Primary osteoarthritis of first carpometacarpal joint of left hand     Chronic right shoulder pain     History of kidney stones     Age-related nuclear cataract of both eyes      Exertional angina (HCC) (anomalous RCA)     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    Social History     Socioeconomic History    Marital status: Divorced     Spouse name: Not on file    Number of children: 1    Years of education: Not on file    Highest education level: Not on file   Occupational History    Occupation: housecleaning     Employer: [employer]   Tobacco Use    Smoking status: Former     Packs/day: 0.50     Years: 27.00     Additional pack years: 0.00     Total pack years: 13.50     Types: Cigarettes     Quit date: 06/28/2002     Years since quitting: 20.1    Smokeless tobacco: Never   Substance and Sexual Activity    Alcohol use: No     Alcohol/week: 0.0 standard drinks of alcohol    Drug use: No    Sexual activity: Not Currently     Partners: Male     Comment: no STI hx; HIV neg 2002   Other Topics Concern    Military Service Not Asked    Blood Transfusions Not Asked    Caffeine Concern Not Asked    Occupational Exposure Not Asked    Hobby Hazards Not Asked    Sleep Concern Not Asked    Stress Concern Yes    Weight Concern Yes    Special Diet Yes    Back Care Not Asked    Exercise No    Bike Helmet Not Asked    Seat Belt Yes    Self-Exams Yes   Social History Narrative    [place], [place]. To [place] 2000.    Lives with her son and her parents. Divorced from husband.    Denies DV. No guns in home.        No regular exercise. Dental care in place.     Review of Systems  See HPI    Physical Exam  BP 110/76   Pulse 90   Wt 87.1 kg (192 lb)   LMP 10/17/2006   SpO2 98%   BMI 36.25 kg/m     General: alert and oriented, no distress, normal affect  Eyes: Sclera and conjunctivae clear, PERRL, EOMI  Ears: normal  external ear canals, tympanic membranes flat and pearly  Nose: no discharge, no  sinus tenderness  Throat: no erythema or exudate, normal voice  Neck: supple, normal cervical spine ROM, no thyromegaly  Lungs: clear to auscultation, good air exchange bilaterally  Heart RRR, S1S2 present, no murmurs, rubs, or gallops  Abdomen: soft, nontender, bowel sounds normoactive x 4, no masses, no organomegaly, no hernia  Extremities: extremities normal, atraumatic, no cyanosis or edema. FROM fingers, R shoulder, lumbar spine. No joint swelling, redness, heat, or tenderness to touch  Pulses: 2+ and symmetric  Skin: skin color, texture, turgor normal. Mild xerosis to hairline, fingertips, heels. No fissuring, scale, erythema, plaque formation.  Lymph nodes: no cervical, supraclavicular lymphadenopathy  Neurologic: normal strength and tone, normal symmetric deep tendon reflexes, normal coordination and gait. Cranial nerves II-XII grossly intact    Assessment and Plan  Shaterria was seen today for physical.    Diagnoses and all orders for this visit:    Routine general medical examination at a health care facility  -     CBC WITH PLATELET  -     Cancel: BASIC METABOLIC PANEL  -     BASIC METABOLIC PANEL    Polyarthralgia  Comments:  R/o inflammatory process. More likely OA. Increase tylenol to 650 mg ER initially, then trial naproxen or Celebrex if needed  Orders:  -     Cancel: C-REACTIVE PROTEIN  -     Cancel: RBC SEDIMENTATION RATE  -     XR SHOULDER RIGHT MINIMUM 2 VIEWS; Future  -     C-REACTIVE PROTEIN  -     RBC SEDIMENTATION RATE    Gastroesophageal reflux disease without esophagitis  Comments:  omeprazole 20 mg daily x 2 weeks, empty stomach, wait 30 minutes before eating or drinking. Consider EGD if not improving    BMI 35.0-35.9,adult  -     HEMOGLOBIN A1C    Family history of diabetes mellitus (DM)  -     HEMOGLOBIN A1C    Contact dermatitis and other eczema, due to unspecified cause  Comments:  prn use of triamcinolone  cream, followed by her usual Aveeno cream  Orders:  -     triamcinolone (KENALOG) 0.5 % cream; Apply topically 3 (three) times daily  for 10 days    Malaise and fatigue  Comments:  ?low blood sugar or blood pressure, will check at home if this happens again or go to nearest ED if severe  Orders:  -     THYROID SCREEN TSH REFLEX FT4  -     VITAMIN B12    Vitamin D deficiency  -     Cancel: VITAMIN D,25 HYDROXY    Encounter for screening mammogram for malignant neoplasm of breast  -      SCREENING MAMMO BILATERAL DIGITAL WITH DBT & CAD; Future  -     VITAMIN D,25 HYDROXY    Encounter for colorectal cancer screening  -     STOOL DNA       Reviewed all of the above with the patient at today's visit. We reviewed healthy lifestyle measures to reduce the risk of cancer and chronic disease. Reviewed schedule for upcoming health screenings. The vaccine list was reviewed and updated. We addressed the patient's health goals for the coming year. Return for comprehensive exam in one year, sooner if needed.      Serena Colonel, CNP

## 2022-08-15 ENCOUNTER — Ambulatory Visit (HOSPITAL_BASED_OUTPATIENT_CLINIC_OR_DEPARTMENT_OTHER): Payer: No Typology Code available for payment source | Admitting: Clinical Cardiac Electrophysiology

## 2022-08-15 NOTE — Progress Notes (Signed)
Dear MAs   Please:     1. Select the language Lexicographer) appropriate Letter Template.       2. Insert the following comments:           "Seus resultados esto normais.     Signed, Serena Colonel, CNP"     3. Send the letter to the patient.    Thank you,  Serena Colonel, CNP

## 2022-08-31 ENCOUNTER — Other Ambulatory Visit (HOSPITAL_BASED_OUTPATIENT_CLINIC_OR_DEPARTMENT_OTHER): Payer: Self-pay | Admitting: Physician Assistant

## 2022-08-31 ENCOUNTER — Other Ambulatory Visit (HOSPITAL_BASED_OUTPATIENT_CLINIC_OR_DEPARTMENT_OTHER): Payer: Self-pay | Admitting: Internal Medicine

## 2022-08-31 DIAGNOSIS — E559 Vitamin D deficiency, unspecified: Secondary | ICD-10-CM

## 2022-08-31 NOTE — Telephone Encounter (Signed)
PER Pharmacy, Deborah [name] is a 57 year old female has requested a refill of      -  Vitamin D3 and Prilosec        Last Office Visit: 08/14/22 with PCP       There are no preventive care reminders to display for this patient.     Other Med Adult:  Most Recent BP Reading(s)  08/14/22 : 110/76        Cholesterol (mg/dL)   Date Value   16/01/9603 216     LOW DENSITY LIPOPROTEIN DIRECT (mg/dL)   Date Value   54/12/8117 144     HIGH DENSITY LIPOPROTEIN (mg/dL)   Date Value   14/78/2956 55     TRIGLYCERIDES (mg/dL)   Date Value   21/30/8657 124         THYROID SCREEN TSH REFLEX FT4 (uIU/mL)   Date Value   08/14/2022 2.280         TSH (THYROID STIM HORMONE) (uIU/mL)   Date Value   12/22/2010 1.31       HEMOGLOBIN A1C (%)   Date Value   08/14/2022 5.5       No results found for: "POCA1C"      INR (no units)   Date Value   06/15/2008 1.0 (L)       SODIUM (mmol/L)   Date Value   08/14/2022 141       POTASSIUM (mmol/L)   Date Value   08/14/2022 4.5           CREATININE (mg/dL)   Date Value   84/69/6295 0.8        Documented patient preferred pharmacies:    [facility] [place] [place] - [address]  Phone: [phone] Fax: [phone]

## 2022-09-01 NOTE — Telephone Encounter (Signed)
PER Pharmacy, Deborah [name] is a 57 year old female has requested a refill of  - metoprolol    Last Office Visit: 01/27/22      There are no preventive care reminders to display for this patient.  Other Med Adult:  Most Recent BP Reading(s)  08/14/22 : 110/76        Cholesterol (mg/dL)   Date Value   16/01/9603 216     LOW DENSITY LIPOPROTEIN DIRECT (mg/dL)   Date Value   54/12/8117 144     HIGH DENSITY LIPOPROTEIN (mg/dL)   Date Value   14/78/2956 55     TRIGLYCERIDES (mg/dL)   Date Value   21/30/8657 124         THYROID SCREEN TSH REFLEX FT4 (uIU/mL)   Date Value   08/14/2022 2.280         TSH (THYROID STIM HORMONE) (uIU/mL)   Date Value   12/22/2010 1.31       HEMOGLOBIN A1C (%)   Date Value   08/14/2022 5.5       No results found for: "POCA1C"      INR (no units)   Date Value   06/15/2008 1.0 (L)       SODIUM (mmol/L)   Date Value   08/14/2022 141       POTASSIUM (mmol/L)   Date Value   08/14/2022 4.5           CREATININE (mg/dL)   Date Value   84/69/6295 0.8     Documented patient preferred pharmacies:    [facility] [place] [place] - [address]  Phone: [phone] Fax: [phone]

## 2022-10-10 ENCOUNTER — Other Ambulatory Visit: Payer: Self-pay

## 2022-10-10 ENCOUNTER — Encounter (HOSPITAL_BASED_OUTPATIENT_CLINIC_OR_DEPARTMENT_OTHER): Payer: Self-pay | Admitting: Clinical Cardiac Electrophysiology

## 2022-10-10 ENCOUNTER — Ambulatory Visit: Payer: 59 | Attending: Clinical Cardiac Electrophysiology | Admitting: Clinical Cardiac Electrophysiology

## 2022-10-10 VITALS — BP 117/77 | HR 81 | Temp 96.4°F | Wt 186.0 lb

## 2022-10-10 DIAGNOSIS — Q245 Malformation of coronary vessels: Secondary | ICD-10-CM | POA: Insufficient documentation

## 2022-10-10 DIAGNOSIS — M2550 Pain in unspecified joint: Secondary | ICD-10-CM | POA: Diagnosis present

## 2022-10-10 DIAGNOSIS — Z6835 Body mass index (BMI) 35.0-35.9, adult: Secondary | ICD-10-CM | POA: Diagnosis present

## 2022-10-10 DIAGNOSIS — I2089 Other forms of angina pectoris: Secondary | ICD-10-CM | POA: Diagnosis present

## 2022-10-10 DIAGNOSIS — I471 Supraventricular tachycardia, unspecified: Secondary | ICD-10-CM | POA: Diagnosis present

## 2022-10-10 MED ORDER — METOPROLOL SUCCINATE ER 100 MG PO TB24
100.0000 mg | ORAL_TABLET | Freq: Every day | ORAL | 3 refills | Status: DC
Start: 2022-10-10 — End: 2023-10-30

## 2022-10-10 NOTE — Progress Notes (Signed)
OUTPATIENT CARDIOLOGY PROGRESS NOTE  Date of visit: 10/10/2022    Patient returns for follow-up in cardiology clinic.      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    She remains very reluctant to undergo cardiac surgery for anomalous coronary artery as recommended by CT Surgery at [facility] (Dr. [place] Bloom).    Fortunately, she has been doing clinically well. 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 She denies DOE/SOB. No LH, syncope, or presyncope. No edema. No orthopnea or PND.    Palpitations only twice since Oct 2023 lasting only 1-2 seconds.    Rare off balance lasting on and off for 1-2 days requiring meclizine - these symptoms occur once every 3-4 months.    Her main complaint is of diffuse joint pains (L thumb very severe). C-reactive protein was normal at 9. ESR was also normal at 18.    Current Medications:  cholecalciferol (VITAMIN D3) 2000 UNIT tablet, Take 1 tablet by mouth in the morning., Disp: 90 tablet, Rfl: 3  omeprazole (PRILOSEC) 20 MG capsule, TAKE 1 CAPSULE BY MOUTH IN THE MORNING., Disp: 90 capsule, Rfl: 3    No current facility-administered medications on file prior to visit.      Review of Patient's Allergies indicates:   Naproxen                Swelling    Comment:Ed visit with angioedema   Ibuprofen               Other (See Comments)    Comment:Epigastric pain, throat swelling?             05/06/12 pt states uses sometimes without issue   Morphine                Other (See Comments)    Comment:"THE LAST TIME I WAS HERE, THEY GAVE IT TO ME  AND             TOLD ME TO TELL THE DOCTOR NOT TO GIVE IT TO ME             ANYMORE. IT MADE ME ACT DIFFERENT".   Pollen extract-tree*    Runny Nose    Comment:HA, itchy watery eyes    FAMILY HISTORY: Father has AFib. Mother is diabetes and has HTN.     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 Lives with adult son, father and mother. No cigs, EtOH, or drugs.     ROS: All other systems were pertinently reviewed and were negative unless otherwise outlined above.    PHYSICAL EXAM:  General: Obese; normal appearance for age; no apparent distress   10/10/22  1449   BP: 117/77   Site: Left Arm   Position: Sitting   Cuff Size: Regular   Pulse: 81   Temp: 96.4 F (35.8 C)   TempSrc: Temporal   SpO2: 98%   Weight: 84.4 kg (186 lb)     HEENT: Oral mucosa is moist, no icterus, no pallor  noted.  NECK: Supple. No thyromegaly.  CHEST: Clear to auscultation, no crackles or rhonchi. No wheezes.  HEART: Normal JVP. Carotid pulse are +2 bilaterally with normal upstrokes and no bruits. Regular rate and rhythm. No murmurs, rubs, or gallops.   ABDOMEN: Soft, no organomegaly detected. No abdominal bruits.  EXTREMITIES: Warm and well perfused. Peripheral pulses are +2 bilaterally in both upper and lower extremities. There is no evidence of peripheral edema.    PSYCHIATRIC: Mood and affect are appropriate.  SKIN: No rash observed.    PERTINENT INVESTIGATIONS:  1. EKG today (on my personal review): Sinus rhythm with 72 bpm. Normal intervals. Normal QRS axis. Normal tracing. Compared to ECG from 01/27/2022, no significant change.  2. LABS:   Lab Results   Component Value Date    NA 141 08/14/2022    K 4.5 08/14/2022    CL 103 08/14/2022    CO2 26 08/14/2022    BUN 16 08/14/2022    CREAT 0.8 08/14/2022    GLUCOSER 91 08/14/2022     Lab Results   Component Value Date    LDL 144 04/29/2021    HDL 55 04/29/2021    TG 124 04/29/2021     ECHO 02/22/2016:  1. LV ejection fraction is 60%.     2. There  are no regional wall motion abnormalities.     3. Normal study.     4. Compared to the previous study dated 04/18/11, there is no significant change.     30-Day Event Monitor Aug/Sept 2021:  Baseline rhythm at sinus at 76 bpm. There were no symptomatic recordings. There were 8 auto-triggered recordings which showed sinus at 47-138 bpm with no ectopy.     Stress Echo 09/03/2020:  1. Bruce Protocol to 5 minutes and 7 METS, achieving 141 BPM = 85% max predicted. Good double product of 24. Exercise capacity was 95% of normal.   2. 5/10 non-pleuritic throat pressure typical of that of chief complaint developed during exercise and subsided over 5 minutes of recovery.   3. No ST changes developed.   4. LV function was normal by echo at rest and with exertion, with no regional wall motion abnormalities.   5. No evidence of inducible ischemia.     Coronary CTA 05/04/2021 [facility]):  Anomalous RCA which originates from the Left Main coronary artery with an interarterial course. No CAD.     14-Day Zio Patch 02/03/22-02/16/22:  The predominant rhythm was Sinus Rhythm with average HR 79 bpm.  There were no patient triggered recordings. There were no diary entries.  There was 1 auto-triggered (asymptomatic) run of supraventricular tachycardia (SVT) lasting 18 beats with a max rate of 174 bpm (avg 165 bpm).   There were 4 isolated PVCs.  There were 75 isolated PACs, 3 atrial couplets, and no atrial triplets.       ASSESSMENT:  57 year old woman with preserved LVEF and normal baseline ECG, h/o SVT diagnosed in March 2010 (suppressed on Toprol XL) who was diagnosed with an anomalous RCA on coronary CTA Jan 2023 (originating from LMCA w/ interarterial course) after she developed increased DOE/exertional throat tightness since March 2022. She was evaluated by Dr. Leonette Most from Cardiology at [facility] and by Dr. Wilma Flavin by CT surgery at [facility] in May 2023 and surgical correction of her RCA was recommended. While she understands the risk of sudden  death with her condition, she is afraid of having surgery and is not yet mentally ready to scheduled it.  Clinically, she is doing well - No recurrent symptoms of sig DOE/throat tightness.    Main complaint is diffuse joint pains including in hands/fingers.    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 She demonstrates good understanding and all questions answered. She is currently not mentally ready to schedule it.  (Z68.34) BMI 34.0-34.9,adult  The 10-year ASCVD risk score (Arnett DK, et al., 2019) is: 2.4%    Values used to calculate the score:      Age: 35 years      Sex: Female      Is Non-Hispanic African American: No      Diabetic: No      Tobacco smoker: No      Systolic Blood Pressure: 117 mmHg      Is BP treated: No      HDL Cholesterol: 55 mg/dL      Total Cholesterol: 216 mg/dL    Plan:   1. She will continue to think about Cardiac Surgery but not currently ready to schedule it.  2. She knows to call 911 for any progressive symptoms of angina, SOB, syncope, or presyncope.  3. Discussed crucial importance of weight loss to diminish the stress on her heart and for her overall improved health.    (I47.1) SVT To 240 bpm (diagnosed March 2010); on Toprol XL; recurrent symptoms relieved w/ vagal maneuvers  Comment: Suspect AVNRT although I cannot retrieve her SVT ECG from March 2010.  Plan:   1. Continue Toprol XL 100 mg qPM.  2. If symptoms progress, will consider SVT ablation.      Return for follow up in Cardiology Clinic in 6 months. To contact earlier if there are any cardiac related issues.  I spent a total of 35 minutes on this visit on the date of service (total time includes all activities  performed on the date of service).    This Cardiology Division patient encounter note was created using voice-recognition software and in real time during the clinic visit. Please excuse any typographical errors that have not been edited out.     Electronically signed by: Lurena Joiner, MD, 10/13/2022 11:58 PM

## 2022-10-12 LAB — EKG

## 2022-10-13 ENCOUNTER — Encounter (HOSPITAL_BASED_OUTPATIENT_CLINIC_OR_DEPARTMENT_OTHER): Payer: Self-pay | Admitting: Clinical Cardiac Electrophysiology

## 2022-11-06 ENCOUNTER — Ambulatory Visit (HOSPITAL_BASED_OUTPATIENT_CLINIC_OR_DEPARTMENT_OTHER): Payer: 59

## 2022-11-06 ENCOUNTER — Ambulatory Visit
Admission: RE | Admit: 2022-11-06 | Discharge: 2022-11-06 | Disposition: A | Discharge status: Home / Self Care | Payer: 59 | Attending: Diagnostic Radiology | Admitting: Diagnostic Radiology

## 2022-11-06 ENCOUNTER — Other Ambulatory Visit: Payer: Self-pay

## 2022-11-06 ENCOUNTER — Telehealth (HOSPITAL_BASED_OUTPATIENT_CLINIC_OR_DEPARTMENT_OTHER): Payer: Self-pay

## 2022-11-06 DIAGNOSIS — M2550 Pain in unspecified joint: Secondary | ICD-10-CM

## 2022-11-06 DIAGNOSIS — M25511 Pain in right shoulder: Secondary | ICD-10-CM | POA: Insufficient documentation

## 2022-11-06 NOTE — Telephone Encounter (Signed)
[facility] Outreach  Call made to Deborah [name] to schedule an appointment  Reschedule  . No interpreter needed.   Patient (self) did not answer at [phone] Outreach patient 2x to reschedule appointment missed ; this team member left a message asking for a call back.  I have completed the following communication and reminder steps: Patient notified by telephone  Sheran Fava, 11/06/2022    On behalf of PCP: Serena Colonel, CNP

## 2022-11-06 NOTE — Progress Notes (Deleted)
HPI    Deborah [name] is a 57 year old female with h/o polyarthralgia. History and exam 5/6 c/w known OA (knees, hands, lumbar spine).  Normal inflammatory markers 5/24, normal R shoulder plain films today.      Takes Tylenol prn, one or two pills of 500 mg pills per day for, helps temporarily. Avoids ibuprofen d/t stomach pain, reflux. ***consider Celebrex, gabapentin, duloxetine    GERD symptoms ***omeprazole 20 mg daily x 2 weeks. ***Consider EGD if not improving         Current Outpatient Medications:     metoprolol (TOPROL-XL) 100 MG 24 hr tablet, Take 1 tablet by mouth daily with dinner, Disp: 90 tablet, Rfl: 3    cholecalciferol (VITAMIN D3) 2000 UNIT tablet, Take 1 tablet by mouth in the morning., Disp: 90 tablet, Rfl: 3    omeprazole (PRILOSEC) 20 MG capsule, TAKE 1 CAPSULE BY MOUTH IN THE MORNING., Disp: 90 capsule, Rfl: 3    Review of Patient's Allergies indicates:   Naproxen                Swelling    Comment:Ed visit with angioedema   Ibuprofen               Other (See Comments)    Comment:Epigastric pain, throat swelling?             05/06/12 pt states uses sometimes without issue   Morphine                Other (See Comments)    Comment:"THE LAST TIME I WAS HERE, THEY GAVE IT TO ME AND             TOLD ME TO TELL THE DOCTOR NOT TO GIVE IT TO ME             ANYMORE. IT MADE ME ACT DIFFERENT".   Pollen extract-tree*    Runny Nose    Comment:HA, itchy watery eyes    Past Medical History:  04/04/2006: Abdominal pain, epigastric      Comment:  Nml LFTs, Hep A/B/C serologies neg; RUQ U/S shows no                hepatobiliary abnormalities (completely normal)  Seen by                GI 3/08 with evaluation pending  12/24/2020: Age-related nuclear cataract of both eyes      Comment:  Mild, early   06/30/2014: Bilateral arm pain  03/15/2015: Chronic right-sided low back pain with right-sided sciatica  02/12/2019: Covid Infection March 2021 -- Ill But Not Hosp'd.      Comment:  Risk category:   Low Tested?  Positive Community managed:               Principal risk manager: Requires additional outreach: No                Risk factors: Obesity, latinX  Clinical Course First date               of symptoms:    06/18/19 06/27/19: (DOI 9): CM Result call               - tested positive at outside location (3/15). Overall her               sx have greatly improved, main complaint is nasal  congestion. Previously had sore throat, fevers, and cough  02/22/2008: CTS (Carpal Tunnel Syndrome), b/l      Comment:  Seen by ortho 02/21/08, referred back to physiatry for                EMG  02/22/2008: De Quervain's Tenosynovitis, R      Comment:  Seen by ortho 02/21/08 referred to OT/splinting  No date: Esophageal reflux  10/07/2008: Greater Trochanteric Bursitis, b/l      Comment:  Seen by physiatry with L steroid injection  No date: Heart disease  05/18/2017: Hyperopia of both eyes with astigmatism and presbyopia  No date: Irregular menstrual cycle  06/06/2017: Lateral epicondylitis of right elbow  11/12/2019: Left ureteral stone  11/14/2006: Leg pain      Comment:  Intermittent, with fatigue, subj weakness; elevated ESR,               but nml CRP/TSH/Ferritin/ANA  Seen by physiatry 09/28/08                with MRI pending  02/25/2013: Lump of skin of lower extremity      Comment:  Dermatofibroma R foot on biopsy  11/10/2004: Lump or mass in breast  05/18/2017: Macular chorioretinal scar of left eye      Comment:  Temporal to fovea, left eye  07/27/2006: Plantar Fasciitis, L      Comment:  S/p cortisone injxn with podiatry 07/25/06  06/30/2014: Positive occult stool blood test  No date: Pregnant state, incidental      Comment:  c/s breech  06/06/2017: Right shoulder injury  08/08/2007: Right subacromial bursitis/rotator cuff tendinopathy      Comment:  S/p steroid injection with physiatry 07/08/08  3/10: SVT (supraventricular tachycardia)      Comment:  [facility]  08/08/2007: Triggering of Finger, R 3rd      Comment:  Seen by Dr. Olam Idler 02/21/08  s/p steroid injection; again               10/07/08    Patient Active Problem List:     Gastroesophageal reflux disease     Other specified gastritis     Allergy to naproxen     Chondromalacia patellae     Lower back pain     Family history of diabetes mellitus (DM)     Intermittent spinal claudication (HCC)     Vitamin D deficiency     DDD (degenerative disc disease), lumbar     Bilateral chronic knee pain     SVT To 240 bpm (diagnosed March 2010); on Toprol XL; recurrent symptoms relieved w/ vagal maneuvers     BMI 35.0-35.9,adult     Macular chorioretinal scar of left eye     Hyperopia of both eyes with astigmatism and presbyopia     Osteoarthritis of both knees     Trigger middle finger of right hand     Primary osteoarthritis of first carpometacarpal joint of left hand     Chronic right shoulder pain     History of kidney stones     Age-related nuclear cataract of both eyes     Exertional angina (HCC) (anomalous RCA)     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    Physical Exam    LMP 10/17/2006     Constitutional: Alert and oriented, appears well, no acute distress    Assessment and Plan    There are no diagnoses linked to this encounter.     Serena Colonel, CNP

## 2022-11-07 ENCOUNTER — Encounter (HOSPITAL_BASED_OUTPATIENT_CLINIC_OR_DEPARTMENT_OTHER): Payer: Self-pay

## 2022-11-07 ENCOUNTER — Other Ambulatory Visit: Payer: Self-pay

## 2022-11-07 ENCOUNTER — Ambulatory Visit
Admission: RE | Admit: 2022-11-07 | Discharge: 2022-11-07 | Disposition: A | Discharge status: Home / Self Care | Payer: 59 | Attending: Internal Medicine | Admitting: Internal Medicine

## 2022-11-07 DIAGNOSIS — Z1231 Encounter for screening mammogram for malignant neoplasm of breast: Secondary | ICD-10-CM | POA: Diagnosis not present

## 2022-11-27 ENCOUNTER — Encounter (HOSPITAL_BASED_OUTPATIENT_CLINIC_OR_DEPARTMENT_OTHER): Payer: Self-pay

## 2022-11-27 ENCOUNTER — Other Ambulatory Visit: Payer: Self-pay

## 2022-11-27 ENCOUNTER — Ambulatory Visit: Payer: 59 | Attending: Internal Medicine

## 2022-11-27 VITALS — BP 109/76 | HR 78 | Temp 97.3°F | Wt 189.0 lb

## 2022-11-27 DIAGNOSIS — Z6835 Body mass index (BMI) 35.0-35.9, adult: Secondary | ICD-10-CM | POA: Insufficient documentation

## 2022-11-27 DIAGNOSIS — G8929 Other chronic pain: Secondary | ICD-10-CM | POA: Insufficient documentation

## 2022-11-27 MED ORDER — DULOXETINE HCL 30 MG PO CPEP
30.0000 mg | ORAL_CAPSULE | Freq: Every day | ORAL | 0 refills | Status: DC
Start: 2022-11-27 — End: 2022-12-25

## 2022-11-27 MED ORDER — TRULICITY 0.75 MG/0.5ML SC SOPN
0.7500 mg | PEN_INJECTOR | SUBCUTANEOUS | 11 refills | Status: DC
Start: 2022-11-27 — End: 2022-12-25

## 2022-11-27 NOTE — Progress Notes (Signed)
HPI    Deborah [name] is a 57 year old female who presents for f/u chronic pain. CRP, ESR, plain films of R shoulder, and physical exam were normal in May when we discussed her joint pains. R shoulder is a little better but her whole rest of her body hurts: mid-back on both sides, bilateral sciatica from mid-buttocks to back of knees, bilateral upper arms at night, R posterior and plantar heel, both knees. She can get through a day of housecleaning but is in agony in the car on the way home.     Tylenol 650 mg didn't really help. Takes ibuprofen sparingly, remote h/o angioedema but has tolerated it for years without recurrence.       Current Outpatient Medications:     dulaglutide (TRULICITY) 0.75 MG/0.5ML sc pen-injector, Inject 0.75 mg under the skin once a week, Disp: 2 mL, Rfl: 11    DULoxetine (CYMBALTA) 30 MG capsule, Take 1 capsule by mouth in the morning., Disp: 30 capsule, Rfl: 0    metoprolol (TOPROL-XL) 100 MG 24 hr tablet, Take 1 tablet by mouth daily with dinner, Disp: 90 tablet, Rfl: 3    cholecalciferol (VITAMIN D3) 2000 UNIT tablet, Take 1 tablet by mouth in the morning., Disp: 90 tablet, Rfl: 3    omeprazole (PRILOSEC) 20 MG capsule, TAKE 1 CAPSULE BY MOUTH IN THE MORNING., Disp: 90 capsule, Rfl: 3    Review of Patient's Allergies indicates:   Naproxen                Swelling    Comment:Ed visit with angioedema   Ibuprofen               Other (See Comments)    Comment:Epigastric pain, throat swelling?             05/06/12 pt states uses sometimes without issue   Morphine                Other (See Comments)    Comment:"THE LAST TIME I WAS HERE, THEY GAVE IT TO ME AND             TOLD ME TO TELL THE DOCTOR NOT TO GIVE IT TO ME             ANYMORE. IT MADE ME ACT DIFFERENT".   Pollen extract-tree*    Runny Nose    Comment:HA, itchy watery eyes    Past Medical History:  04/04/2006: Abdominal pain, epigastric      Comment:  Nml LFTs, Hep A/B/C serologies neg; RUQ U/S shows no                 hepatobiliary abnormalities (completely normal)  Seen by                GI 3/08 with evaluation pending  12/24/2020: Age-related nuclear cataract of both eyes      Comment:  Mild, early   06/30/2014: Bilateral arm pain  03/15/2015: Chronic right-sided low back pain with right-sided sciatica  02/12/2019: Covid Infection March 2021 -- Ill But Not Hosp'd.      Comment:  Risk category:   Low Tested? Positive Community managed:               Principal risk manager: Requires additional outreach: No                Risk factors: Obesity, latinX  Clinical Course First date  of symptoms:    06/18/19 06/27/19: (DOI 9): CM Result call               - tested positive at outside location (3/15). Overall her               sx have greatly improved, main complaint is nasal                congestion. Previously had sore throat, fevers, and cough  02/22/2008: CTS (Carpal Tunnel Syndrome), b/l      Comment:  Seen by ortho 02/21/08, referred back to physiatry for                EMG  02/22/2008: De Quervain's Tenosynovitis, R      Comment:  Seen by ortho 02/21/08 referred to OT/splinting  No date: Esophageal reflux  10/07/2008: Greater Trochanteric Bursitis, b/l      Comment:  Seen by physiatry with L steroid injection  No date: Heart disease  05/18/2017: Hyperopia of both eyes with astigmatism and presbyopia  No date: Irregular menstrual cycle  06/06/2017: Lateral epicondylitis of right elbow  11/12/2019: Left ureteral stone  11/14/2006: Leg pain      Comment:  Intermittent, with fatigue, subj weakness; elevated ESR,               but nml CRP/TSH/Ferritin/ANA  Seen by physiatry 09/28/08                with MRI pending  02/25/2013: Lump of skin of lower extremity      Comment:  Dermatofibroma R foot on biopsy  11/10/2004: Lump or mass in breast  05/18/2017: Macular chorioretinal scar of left eye      Comment:  Temporal to fovea, left eye  07/27/2006: Plantar Fasciitis, L      Comment:  S/p cortisone injxn with podiatry 07/25/06  06/30/2014:  Positive occult stool blood test  No date: Pregnant state, incidental      Comment:  c/s breech  06/06/2017: Right shoulder injury  08/08/2007: Right subacromial bursitis/rotator cuff tendinopathy      Comment:  S/p steroid injection with physiatry 07/08/08  3/10: SVT (supraventricular tachycardia)      Comment:  [facility]  08/08/2007: Triggering of Finger, R 3rd      Comment:  Seen by Dr. Olam Idler 02/21/08 s/p steroid injection; again               10/07/08    Patient Active Problem List:     Gastroesophageal reflux disease     Other specified gastritis     Allergy to naproxen     Chondromalacia patellae     Lower back pain     Family history of diabetes mellitus (DM)     Intermittent spinal claudication (HCC)     Vitamin D deficiency     DDD (degenerative disc disease), lumbar     Bilateral chronic knee pain     SVT To 240 bpm (diagnosed March 2010); on Toprol XL; recurrent symptoms relieved w/ vagal maneuvers     BMI 35.0-35.9,adult     Macular chorioretinal scar of left eye     Hyperopia of both eyes with astigmatism and presbyopia     Osteoarthritis of both knees     Trigger middle finger of right hand     Primary osteoarthritis of first carpometacarpal joint of left hand     Chronic right shoulder pain     History of kidney stones  Age-related nuclear cataract of both eyes     Exertional angina (HCC) (anomalous RCA)     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    Physical Exam    BP 109/76   Pulse 78   Temp 97.3 F (36.3 C) (Temporal)   Wt 85.7 kg (189 lb)   LMP 10/17/2006   SpO2 97%   BMI 35.68 kg/m     Constitutional: Alert and oriented, appears well, no acute  distress    Assessment and Plan    Diagnoses and all orders for this visit:    Other chronic pain  -     DULoxetine (CYMBALTA) 30 MG capsule; Take 1 capsule by mouth in the morning.    BMI 35.0-35.9,adult  -     dulaglutide (TRULICITY) 0.75 MG/0.5ML sc pen-injector; Inject 0.75 mg under the skin once a week     Trial of duloxetine for chronic pain, in addition to acetaminophen, sparing use of ibuprofen. Reviewed potential side effects, will take up to 6 weeks to see full effect. Can increase to 60 mg dose at that time if tolerated and seeing some response at 30 mg.     Her weight and heavy breasts are also contributing to chronic shoulder, back and leg pains. No family or personal h/o MENS2, medullary thyroid CA, pancreatitis, or gallbladder dz. Willing to start trial of dulaglutide for weight loss, in addition to continuing with active lifestyle and healthy eating. Referral to nutrition placed for help with the latter. She is familiar with the injections, she gives them to her mother, who has had good response to her own GLP1a treatment.    F/u 4 weeks to assess response to duloxetine, dulaglutide.    Serena Colonel, CNP

## 2022-12-25 ENCOUNTER — Ambulatory Visit: Payer: 59 | Attending: Internal Medicine

## 2022-12-25 ENCOUNTER — Other Ambulatory Visit: Payer: Self-pay

## 2022-12-25 VITALS — BP 122/80 | HR 90 | Temp 97.4°F | Wt 184.0 lb

## 2022-12-25 DIAGNOSIS — Z6835 Body mass index (BMI) 35.0-35.9, adult: Secondary | ICD-10-CM | POA: Diagnosis present

## 2022-12-25 DIAGNOSIS — G8929 Other chronic pain: Secondary | ICD-10-CM | POA: Diagnosis not present

## 2022-12-25 DIAGNOSIS — E669 Obesity, unspecified: Secondary | ICD-10-CM | POA: Insufficient documentation

## 2022-12-25 MED ORDER — TRULICITY 0.75 MG/0.5ML SC SOPN
0.7500 mg | PEN_INJECTOR | SUBCUTANEOUS | 11 refills | Status: DC
Start: 2022-12-25 — End: 2023-01-29

## 2022-12-25 MED ORDER — DULOXETINE HCL 30 MG PO CPEP
30.0000 mg | ORAL_CAPSULE | Freq: Every day | ORAL | 3 refills | Status: DC
Start: 2022-12-25 — End: 2023-01-29

## 2022-12-25 NOTE — Progress Notes (Signed)
HPI    Deborah [name] is a 57 year old female here for f/u after starting Trulicity for obesity, duloxetine for chronic pain. She is taking both  as directed, denies missed doses, endorses mild side effect of early fullness on 3rd day after the injection, good response - joints are less sore and less swollen, and weight is down 5 lbs in one month.         [name] Outpatient Medications:     dulaglutide (TRULICITY) 0.75 MG/0.5ML sc pen-injector, Inject 0.75 mg under the skin once a week, Disp: 2 mL, Rfl: 11    DULoxetine (CYMBALTA) 30 MG capsule, Take 1 capsule by mouth in the morning., Disp: 90 capsule, Rfl: 3    metoprolol (TOPROL-XL) 100 MG 24 hr tablet, Take 1 tablet by mouth daily with dinner, Disp: 90 tablet, Rfl: 3    cholecalciferol (VITAMIN D3) 2000 UNIT tablet, Take 1 tablet by mouth in the morning., Disp: 90 tablet, Rfl: 3    omeprazole (PRILOSEC) 20 MG capsule, TAKE 1 CAPSULE BY MOUTH IN THE MORNING., Disp: 90 capsule, Rfl: 3    Review of Patient's Allergies indicates:   Naproxen                Swelling    Comment:Ed visit with angioedema   Ibuprofen               Other (See Comments)    Comment:Epigastric pain, throat swelling?             05/06/12 pt states uses sometimes without issue   Morphine                Other (See Comments)    Comment:"THE LAST TIME I WAS HERE, THEY GAVE IT TO ME AND             TOLD ME TO TELL THE DOCTOR NOT TO GIVE IT TO ME             ANYMORE. IT MADE ME ACT DIFFERENT".   Pollen extract-tree*    Runny Nose    Comment:HA, itchy watery eyes    Past Medical History:  04/04/2006: Abdominal pain, epigastric      Comment:  Nml LFTs, Hep A/B/C serologies neg; RUQ U/S shows no                hepatobiliary abnormalities (completely normal)  Seen by                GI 3/08 with evaluation pending  12/24/2020: Age-related nuclear cataract of both eyes      Comment:  Mild, early   06/30/2014: Bilateral arm pain  03/15/2015: Chronic right-sided low back pain with right-sided  sciatica  02/12/2019: Covid Infection March 2021 -- Ill But Not Hosp'd.      Comment:  Risk category:   Low Tested? Positive Community managed:               Principal risk manager: Requires additional outreach: No                Risk factors: Obesity, latinX  Clinical Course First date               of symptoms:    06/18/19 06/27/19: (DOI 9): CM Result call               - tested positive at outside location (3/15). Overall her  sx have greatly improved, main complaint is nasal                congestion. Previously had sore throat, fevers, and cough  02/22/2008: CTS (Carpal Tunnel Syndrome), b/l      Comment:  Seen by ortho 02/21/08, referred back to physiatry for                EMG  02/22/2008: De Quervain's Tenosynovitis, R      Comment:  Seen by ortho 02/21/08 referred to OT/splinting  No date: Esophageal reflux  10/07/2008: Greater Trochanteric Bursitis, b/l      Comment:  Seen by physiatry with L steroid injection  No date: Heart disease  05/18/2017: Hyperopia of both eyes with astigmatism and presbyopia  No date: Irregular menstrual cycle  06/06/2017: Lateral epicondylitis of right elbow  11/12/2019: Left ureteral stone  11/14/2006: Leg pain      Comment:  Intermittent, with fatigue, subj weakness; elevated ESR,               but nml CRP/TSH/Ferritin/ANA  Seen by physiatry 09/28/08                with MRI pending  02/25/2013: Lump of skin of lower extremity      Comment:  Dermatofibroma R foot on biopsy  11/10/2004: Lump or mass in breast  05/18/2017: Macular chorioretinal scar of left eye      Comment:  Temporal to fovea, left eye  07/27/2006: Plantar Fasciitis, L      Comment:  S/p cortisone injxn with podiatry 07/25/06  06/30/2014: Positive occult stool blood test  No date: Pregnant state, incidental      Comment:  c/s breech  06/06/2017: Right shoulder injury  08/08/2007: Right subacromial bursitis/rotator cuff tendinopathy      Comment:  S/p steroid injection with physiatry 07/08/08  3/10: SVT (supraventricular  tachycardia)      Comment:  [facility]  08/08/2007: Triggering of Finger, R 3rd      Comment:  Seen by Dr. Olam Idler 02/21/08 s/p steroid injection; again               10/07/08    Patient Active Problem List:     Gastroesophageal reflux disease     Other specified gastritis     Allergy to naproxen     Chondromalacia patellae     Lower back pain     Family history of diabetes mellitus (DM)     Intermittent spinal claudication (HCC)     Vitamin D deficiency     DDD (degenerative disc disease), lumbar     Bilateral chronic knee pain     SVT To 240 bpm (diagnosed March 2010); on Toprol XL; recurrent symptoms relieved w/ vagal maneuvers     BMI 35.0-35.9,adult     Macular chorioretinal scar of left eye     Hyperopia of both eyes with astigmatism and presbyopia     Osteoarthritis of both knees     Trigger middle finger of right hand     Primary osteoarthritis of first carpometacarpal joint of left hand     Chronic right shoulder pain     History of kidney stones     Age-related nuclear cataract of both eyes     Exertional angina (HCC) (anomalous RCA)     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    Physical Exam    BP 122/80 (Site: RA, Position: Sitting, Cuff Size: Reg)   Pulse 90   Temp 97.4 F (36.3 C) (Temporal)   Wt 83.5 kg (184 lb)   LMP 10/17/2006   SpO2 96%   BMI 34.74 kg/m     Constitutional: Alert and oriented, appears well, no acute distress    Most Recent Weight Reading(s)  12/25/22 : 83.5 kg (184 lb)  11/27/22 : 85.7 kg (189 lb)  10/10/22 : 84.4 kg (186 lb)      Assessment and Plan    Deborah [name] was seen today for follow up.    Diagnoses and all orders for this  visit:    BMI 35.0-35.9,adult  -     dulaglutide (TRULICITY) 0.75 MG/0.5ML sc pen-injector; Inject 0.75 mg under the skin once a week    Other chronic pain  -     DULoxetine (CYMBALTA) 30 MG capsule; Take 1 capsule by mouth in the morning.     F/u one month for next weight check, trial of increasing to 1.5 mg Trulicity if tolerating better at that point.     Serena Colonel, CNP

## 2022-12-26 ENCOUNTER — Telehealth (HOSPITAL_BASED_OUTPATIENT_CLINIC_OR_DEPARTMENT_OTHER): Payer: Self-pay

## 2022-12-26 NOTE — PostOp Call (Signed)
Called patient to book a f/u appointment with Dr. Manley Mason pr. Did not answered left msg to call back to book

## 2022-12-26 NOTE — Telephone Encounter (Signed)
Called patient to book a f/u appointment with Dr. Manley Mason pr. Did not answered left msg to call back to book

## 2023-01-26 IMAGING — MR MUSCULO^JOELHO
7 series · 39 of 40 positions shown · non-contrast
Comparison: none

[Series 10: T2 · coronal · 2.5mm · 0.35mm/px · 2 of 9 slices shown]
[im 1/9]
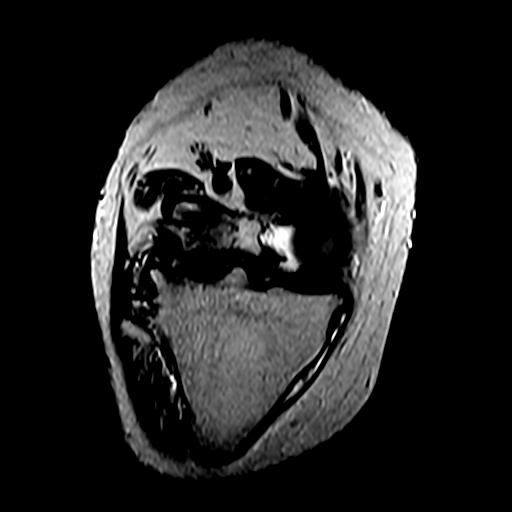
[im 9/9]
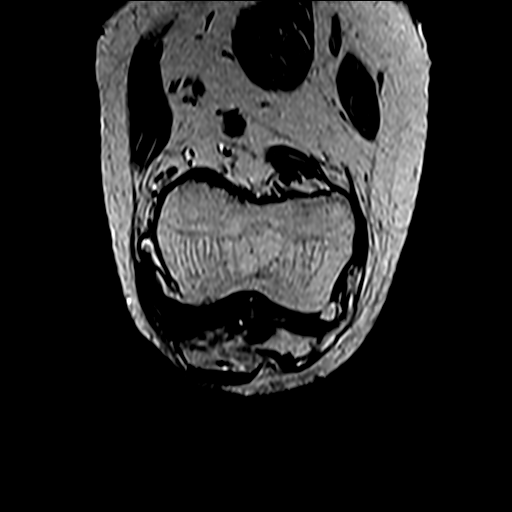

[Series 11: sagital stir_(person_name)_1 · sagittal · 4.0mm · 0.70mm/px · 5 of 16 slices shown]
[im 1/16]
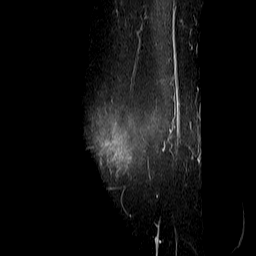
[im 4/16]
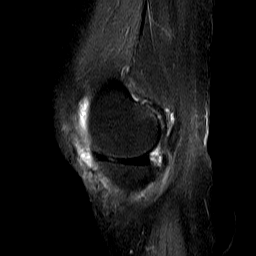
[im 8/16]
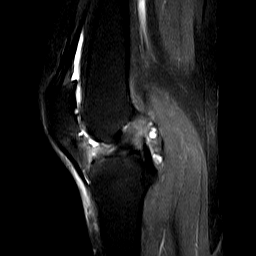
[im 12/16]
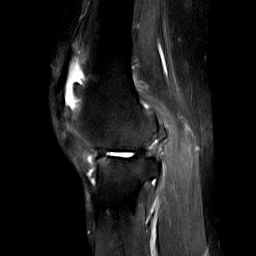
[im 16/16]
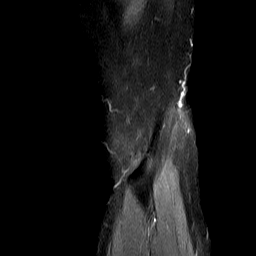

[Series 12: sagital dp_(person_name)_1 · sagittal · 4.0mm · 0.56mm/px · 6 of 16 slices shown]
[im 1/16]
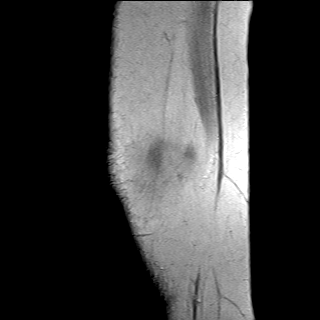
[im 4/16]
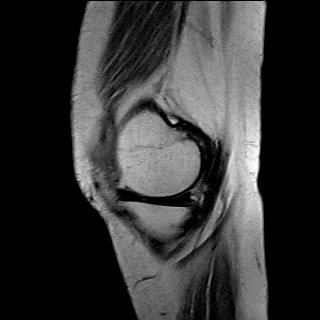
[im 7/16]
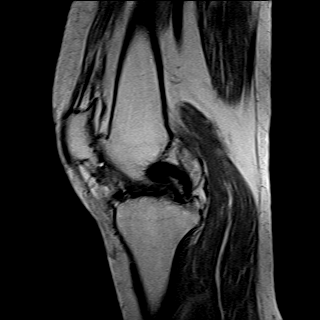
[im 10/16]
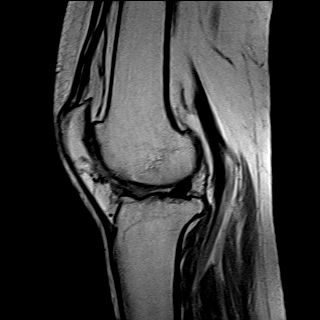
[im 13/16]
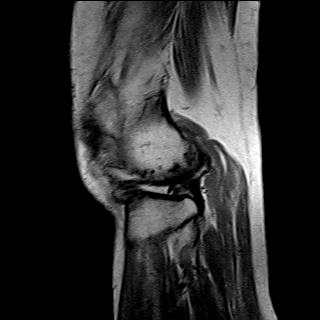
[im 16/16]
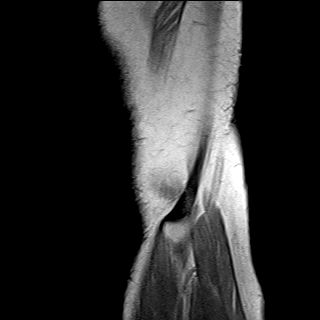

[Series 13: axial stir_(person_name)_1 · axial · 4.0mm · 0.70mm/px · z∈[-53,+57]mm · 7 of 20 slices shown]
[im 1/20]
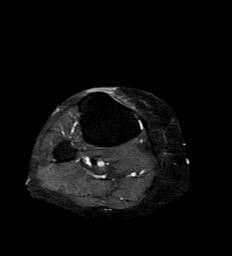
[im 4/20]
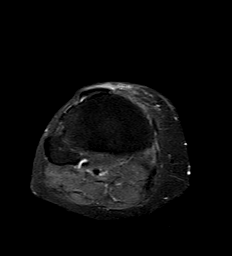
[im 7/20]
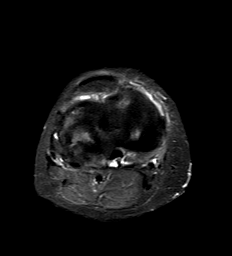
[im 10/20]
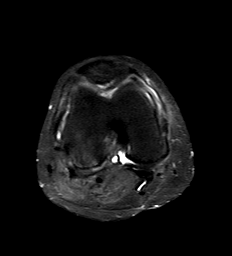
[im 13/20]
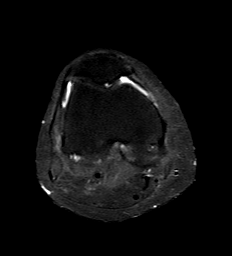
[im 16/20]
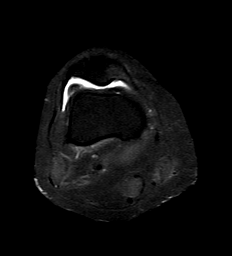
[im 20/20]
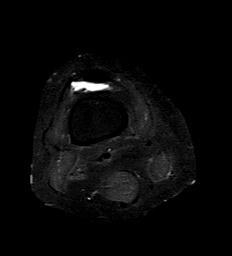

[Series 14: coronal stir_(person_name)_1 · coronal · 4.0mm · 0.70mm/px · 6 of 16 slices shown]
[im 1/16]
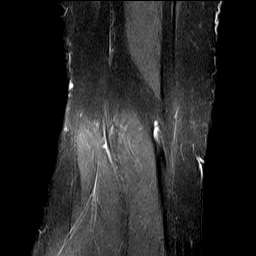
[im 4/16]
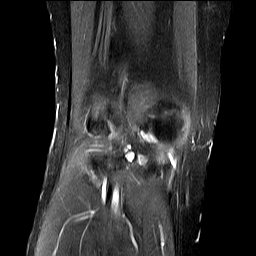
[im 7/16]
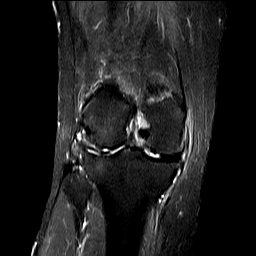
[im 10/16]
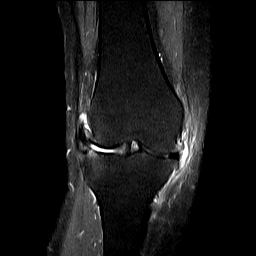
[im 13/16]
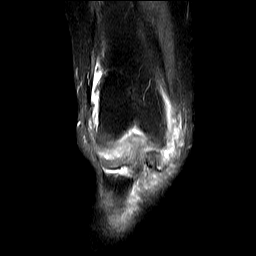
[im 16/16]
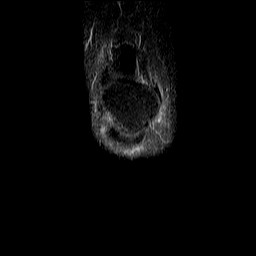

[Series 15: coronal t1_(person_name)_1 · coronal · 4.0mm · 0.56mm/px · 6 of 16 slices shown]
[im 1/16]
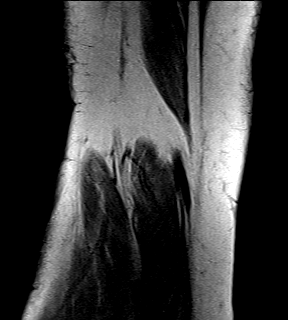
[im 4/16]
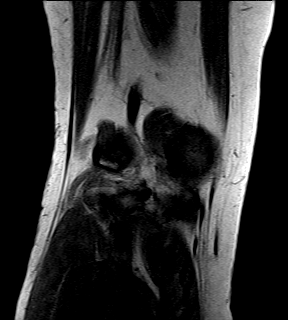
[im 7/16]
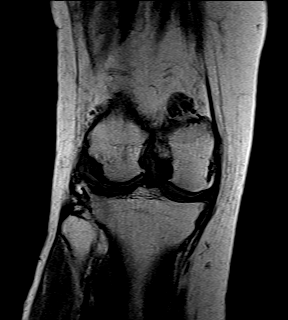
[im 10/16]
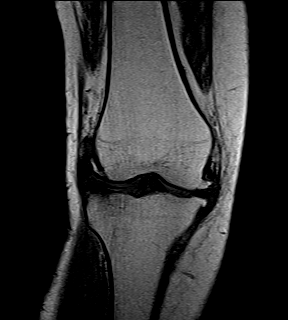
[im 13/16]
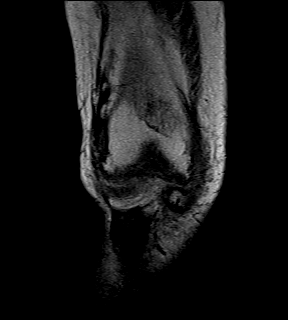
[im 16/16]
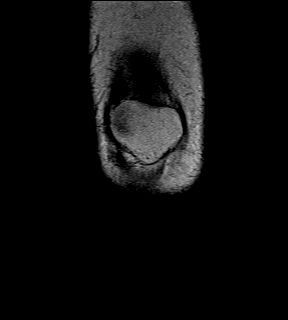

[Series 16: axial patela_(person_name)_1 · axial · 3.0mm · 0.31mm/px · z∈[-14,+40]mm · 7 of 22 slices shown]
[im 1/22]
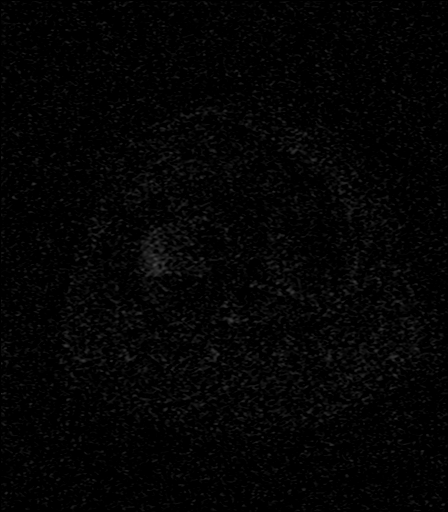
[im 4/22]
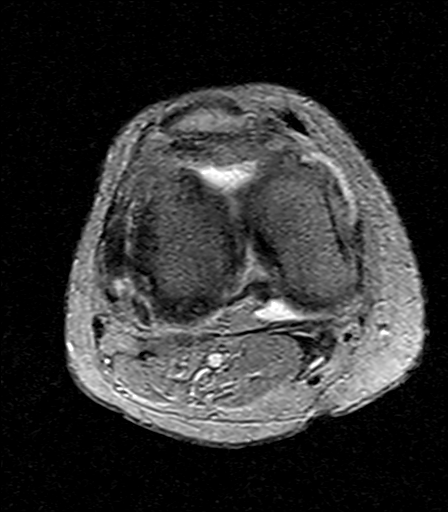
[im 7/22]
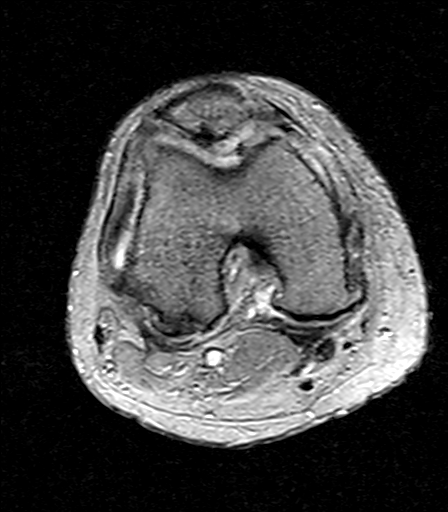
[im 10/22]
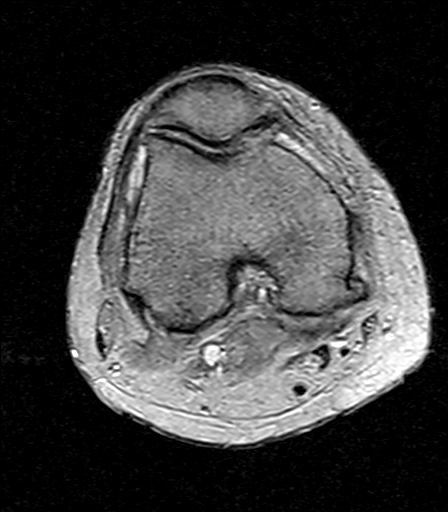
[im 13/22]
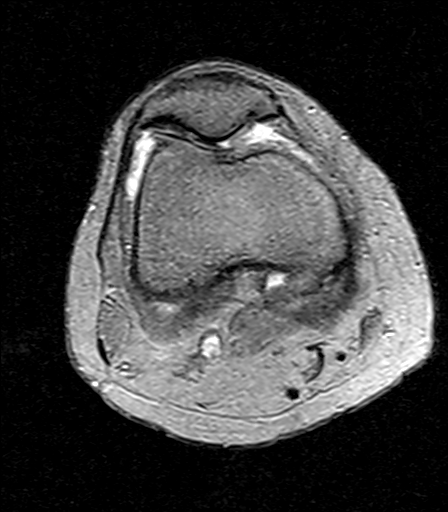
[im 16/22]
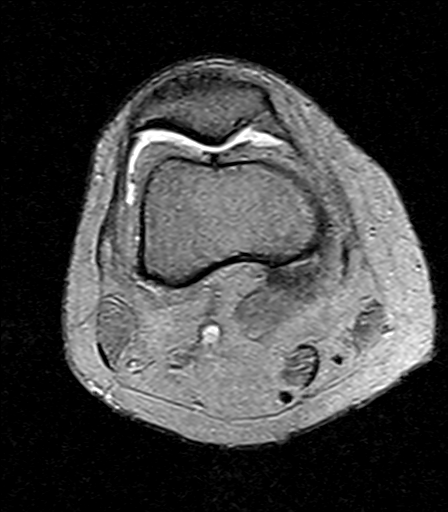
[im 19/22]
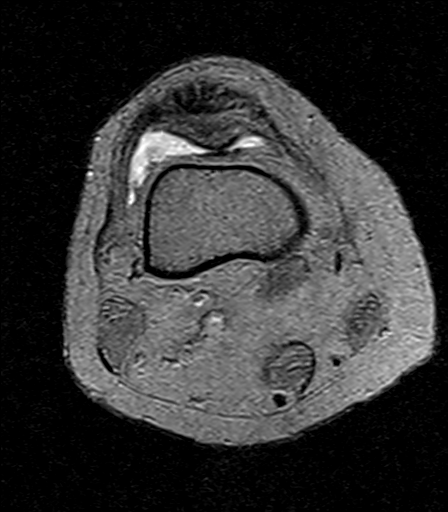

[39 of 40 positions shown; findings below may reference images not displayed]

Técnica:
Exame realizado com sequências ponderadas em T1, T2 e T2 com supressão de gordura.
Relatório:
Espessamento fibrocicatricial da gordura infrapatelar relacionado a manipulação cirúrgica pregressa.
RESSONÂNCIA MAGNÉTICA DO JOELHO DIREITO
Leve alteração do sinal de aspecto degenerativo no corno posterior do menisco medial.
Redução das dimensões do menisco lateral compatível com meniscectomia parcial com alteração
degenerativa dos remanescentes meniscais e sinais de extrusão do remanescente do corpo em relação à
interlinha articular.
Alteração degenerativa mucoide do ligamento cruzado anterior.
Ligamentos cruzado posterior e colaterais íntegros.
Entesófitos no polo superior da patela relacionados à inserção do tendão quadríceps.
Tendão patelar de aspecto conservado.
Alterações degenerativas tricompartimentais caracterizadas por afilamento condral irregular e reação
osteofitária marginal, destacando-se erosões condrais profundas com exposição do osso subcondral e edema
/ cistos subcondrais nas superfícies de carga do compartimento femorotibial lateral e na porção
posterossuperior do côndilo femoral medial.
Moderado derrame articular com leve sinovite.
Distensão líquida do recesso do gastrocnêmio medial / semimembranoso com formação de cisto poplíteo.
Tendinopatia insercional do semimembranoso.
Peritendinopatia da pata de ganso.
Demais estruturas periarticulares sem alterações ao método.
IMPRESSÃO:
Espessamento fibrocicatricial da gordura infrapatelar relacionado a manipulação cirúrgica pregressa.
Leve alteração do sinal de aspecto degenerativo no corno posterior do menisco medial.
Sinais de meniscectomia parcial lateral com alteração degenerativa dos remanescentes meniscais e sinais de
extrusão do remanescente do corpo em relação à interlinha articular.
Alteração degenerativa mucoide do ligamento cruzado anterior.
Entesófitos no polo superior da patela relacionados à inserção do tendão quadríceps.
Alterações degenerativas tricompartimentais, mais proeminentes no compartimento femorotibial lateral.
Moderado derrame articular com leve sinovite.
Cisto poplíteo.
Tendinopatia insercional do semimembranoso.
Peritendinopatia da pata de ganso.

## 2023-01-29 ENCOUNTER — Ambulatory Visit: Payer: 59 | Attending: Internal Medicine

## 2023-01-29 ENCOUNTER — Other Ambulatory Visit: Payer: Self-pay

## 2023-01-29 ENCOUNTER — Encounter (HOSPITAL_BASED_OUTPATIENT_CLINIC_OR_DEPARTMENT_OTHER): Payer: Self-pay

## 2023-01-29 VITALS — BP 113/76 | HR 97 | Temp 97.8°F | Ht 61.02 in | Wt 186.0 lb

## 2023-01-29 DIAGNOSIS — G8929 Other chronic pain: Secondary | ICD-10-CM | POA: Diagnosis present

## 2023-01-29 DIAGNOSIS — Z6835 Body mass index (BMI) 35.0-35.9, adult: Secondary | ICD-10-CM | POA: Insufficient documentation

## 2023-01-29 DIAGNOSIS — Z23 Encounter for immunization: Secondary | ICD-10-CM | POA: Diagnosis present

## 2023-01-29 DIAGNOSIS — E669 Obesity, unspecified: Secondary | ICD-10-CM | POA: Insufficient documentation

## 2023-01-29 MED ORDER — DULOXETINE HCL 60 MG PO CPEP
60.00 mg | ORAL_CAPSULE | Freq: Every day | ORAL | 3 refills | Status: AC
Start: 2023-01-29 — End: 2024-01-29

## 2023-01-29 MED ORDER — TRULICITY 1.5 MG/0.5ML SC SOPN
1.5000 mg | PEN_INJECTOR | SUBCUTANEOUS | 11 refills | Status: DC
Start: 2023-01-29 — End: 2023-05-07

## 2023-01-29 NOTE — Progress Notes (Signed)
HPI    Deborah [name] is a 57 year old female here for f/u Trulicity injection for weight management, duloxetine for chronic pain.     Trulicity - denies side effects, esp early satiety that she was experiencing before. Weight is up 2 lbs from last month unfortunately. Interested in increasing dose to 1.5 mg weekly from 0.75 mg weekly.    Duloxetine - continues having much less pain, mood is also better. Still has daily pain in wrists, knees.       Current Outpatient Medications:     dulaglutide (TRULICITY) 1.5 MG/0.5ML sc pen-injector, Inject 1.5 mg under the skin once a week, Disp: 2 mL, Rfl: 11    DULoxetine (CYMBALTA) 60 MG capsule, Take 1 capsule by mouth daily, Disp: 90 capsule, Rfl: 3    metoprolol (TOPROL-XL) 100 MG 24 hr tablet, Take 1 tablet by mouth daily with dinner, Disp: 90 tablet, Rfl: 3    cholecalciferol (VITAMIN D3) 2000 UNIT tablet, Take 1 tablet by mouth in the morning., Disp: 90 tablet, Rfl: 3    omeprazole (PRILOSEC) 20 MG capsule, TAKE 1 CAPSULE BY MOUTH IN THE MORNING., Disp: 90 capsule, Rfl: 3    Review of Patient's Allergies indicates:   Naproxen                Swelling    Comment:Ed visit with angioedema   Ibuprofen               Other (See Comments)    Comment:Epigastric pain, throat swelling?             05/06/12 pt states uses sometimes without issue   Morphine                Other (See Comments)    Comment:"THE LAST TIME I WAS HERE, THEY GAVE IT TO ME AND             TOLD ME TO TELL THE DOCTOR NOT TO GIVE IT TO ME             ANYMORE. IT MADE ME ACT DIFFERENT".   Pollen extract-tree*    Runny Nose    Comment:HA, itchy watery eyes    Past Medical History:  04/04/2006: Abdominal pain, epigastric      Comment:  Nml LFTs, Hep A/B/C serologies neg; RUQ U/S shows no                hepatobiliary abnormalities (completely normal)  Seen by                GI 3/08 with evaluation pending  12/24/2020: Age-related nuclear cataract of both eyes      Comment:  Mild, early   06/30/2014: Bilateral  arm pain  03/15/2015: Chronic right-sided low back pain with right-sided sciatica  02/12/2019: Covid Infection March 2021 -- Ill But Not Hosp'd.      Comment:  Risk category:   Low Tested? Positive Community managed:               Principal risk manager: Requires additional outreach: No                Risk factors: Obesity, latinX  Clinical Course First date               of symptoms:    06/18/19 06/27/19: (DOI 9): CM Result call               - tested positive at outside location (3/15). Overall  her               sx have greatly improved, main complaint is nasal                congestion. Previously had sore throat, fevers, and cough  02/22/2008: CTS (Carpal Tunnel Syndrome), b/l      Comment:  Seen by ortho 02/21/08, referred back to physiatry for                EMG  02/22/2008: De Quervain's Tenosynovitis, R      Comment:  Seen by ortho 02/21/08 referred to OT/splinting  No date: Esophageal reflux  10/07/2008: Greater Trochanteric Bursitis, b/l      Comment:  Seen by physiatry with L steroid injection  No date: Heart disease  05/18/2017: Hyperopia of both eyes with astigmatism and presbyopia  No date: Irregular menstrual cycle  06/06/2017: Lateral epicondylitis of right elbow  11/12/2019: Left ureteral stone  11/14/2006: Leg pain      Comment:  Intermittent, with fatigue, subj weakness; elevated ESR,               but nml CRP/TSH/Ferritin/ANA  Seen by physiatry 09/28/08                with MRI pending  02/25/2013: Lump of skin of lower extremity      Comment:  Dermatofibroma R foot on biopsy  11/10/2004: Lump or mass in breast  05/18/2017: Macular chorioretinal scar of left eye      Comment:  Temporal to fovea, left eye  07/27/2006: Plantar Fasciitis, L      Comment:  S/p cortisone injxn with podiatry 07/25/06  06/30/2014: Positive occult stool blood test  No date: Pregnant state, incidental      Comment:  c/s breech  06/06/2017: Right shoulder injury  08/08/2007: Right subacromial bursitis/rotator cuff tendinopathy      Comment:   S/p steroid injection with physiatry 07/08/08  3/10: SVT (supraventricular tachycardia) (HCC)      Comment:  [facility]  08/08/2007: Triggering of Finger, R 3rd      Comment:  Seen by Dr. Olam Idler 02/21/08 s/p steroid injection; again               10/07/08    Patient Active Problem List:     Gastroesophageal reflux disease     Other specified gastritis     Allergy to naproxen     Chondromalacia patellae     Lower back pain     Family history of diabetes mellitus (DM)     Intermittent spinal claudication (HCC)     Vitamin D deficiency     DDD (degenerative disc disease), lumbar     Bilateral chronic knee pain     SVT To 240 bpm (diagnosed March 2010); on Toprol XL; recurrent symptoms relieved w/ vagal maneuvers     BMI 35.0-35.9,adult     Macular chorioretinal scar of left eye     Hyperopia of both eyes with astigmatism and presbyopia     Osteoarthritis of both knees     Trigger middle finger of right hand     Primary osteoarthritis of first carpometacarpal joint of left hand     Chronic right shoulder pain     History of kidney stones     Age-related nuclear cataract of both eyes     Exertional angina (HCC) (anomalous RCA)     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    Physical Exam    BP 113/76 (Site: LA, Position: Sitting, Cuff Size: Reg)   Pulse 97   Temp 97.8 F (36.6 C) (Temporal)   Ht 5' 1.02" (1.55 m)   Wt 84.4 kg (186 lb)   LMP 10/17/2006   SpO2 98%   BMI 35.12 kg/m     Constitutional: Alert and oriented, appears well, no acute distress    Assessment and Plan    Kashea was seen today for follow up.    Diagnoses and all orders for this visit:    BMI  35.0-35.9,adult  increase Trulicity to 1.5 mg dose, f/u if side effects or not starting to lose wt again. If tolerating and losing wt, increase to 3 mg at f/u, otherwise consider Wegovy, Zepbound     Other chronic pain  trial duloxetine 60 mg, decrease back to 30 mg if pain management not better at 60 mg or if she develops side effects        Serena Colonel, CNP

## 2023-01-29 NOTE — Progress Notes (Signed)
Influenza Vaccine Procedure  January 29, 2023    1. Has the patient received the information for the influenza vaccine? Yes    2. Does the patient have any of the following contraindications?  Allergy to eggs? No  Allergic reaction to previous influenza vaccines? No  Any other problems to previous influenza vaccines? No  Paralyzed by Guillain-Barre syndrome?  No  Current moderate or severe illness? No    3. The vaccine has been administered in the usual fashion.     Immunization information reviewed. Current VIS reviewed and given to patient/ guardian. Verbal assent obtained from patient/ guardian.  See immunization/Injection module or chart review for date of publication and additional information. Verbal assent obtained from patient/guardian. Comfort measures for possible side effects reviewed.

## 2023-03-05 ENCOUNTER — Other Ambulatory Visit (HOSPITAL_BASED_OUTPATIENT_CLINIC_OR_DEPARTMENT_OTHER): Payer: Self-pay

## 2023-03-05 DIAGNOSIS — L259 Unspecified contact dermatitis, unspecified cause: Secondary | ICD-10-CM

## 2023-03-05 NOTE — Telephone Encounter (Signed)
[facility], Deborah [name] is a 57 year old female has requested a refill of kenalog.      Last OFFICE/TELE Visit:  01/29/23 with pcp      Last Physical Exam:   08/14/2022     There are no preventive care reminders to display for this patient.    Other Med Adult:  Most Recent BP Reading(s)  01/29/23 : 113/76        Cholesterol (mg/dL)   Date Value   78/46/9629 216     LOW DENSITY LIPOPROTEIN DIRECT (mg/dL)   Date Value   52/84/1324 144     HIGH DENSITY LIPOPROTEIN (mg/dL)   Date Value   40/01/2724 55     TRIGLYCERIDES (mg/dL)   Date Value   36/64/4034 124         THYROID SCREEN TSH REFLEX FT4 (uIU/mL)   Date Value   08/14/2022 2.280         TSH (THYROID STIM HORMONE) (uIU/mL)   Date Value   12/22/2010 1.31       HEMOGLOBIN A1C (%)   Date Value   08/14/2022 5.5       No results found for: "POCA1C"      INR (no units)   Date Value   06/15/2008 1.0 (L)       SODIUM (mmol/L)   Date Value   08/14/2022 141       POTASSIUM (mmol/L)   Date Value   08/14/2022 4.5           CREATININE (mg/dL)   Date Value   74/25/9563 0.8

## 2023-04-17 ENCOUNTER — Encounter (HOSPITAL_BASED_OUTPATIENT_CLINIC_OR_DEPARTMENT_OTHER): Payer: Self-pay | Admitting: Clinical Cardiac Electrophysiology

## 2023-04-17 ENCOUNTER — Ambulatory Visit: Payer: 59 | Attending: Clinical Cardiac Electrophysiology | Admitting: Clinical Cardiac Electrophysiology

## 2023-04-17 ENCOUNTER — Other Ambulatory Visit: Payer: Self-pay

## 2023-04-17 VITALS — BP 124/81 | HR 79 | Temp 96.2°F | Wt 182.0 lb

## 2023-04-17 DIAGNOSIS — I2089 Other forms of angina pectoris: Secondary | ICD-10-CM | POA: Insufficient documentation

## 2023-04-17 DIAGNOSIS — Q245 Malformation of coronary vessels: Secondary | ICD-10-CM | POA: Insufficient documentation

## 2023-04-17 DIAGNOSIS — I471 Supraventricular tachycardia, unspecified: Secondary | ICD-10-CM | POA: Insufficient documentation

## 2023-04-17 DIAGNOSIS — Z6834 Body mass index (BMI) 34.0-34.9, adult: Secondary | ICD-10-CM | POA: Insufficient documentation

## 2023-04-17 NOTE — Progress Notes (Signed)
OUTPATIENT CARDIOLOGY PROGRESS NOTE  Date of visit: 04/17/2023    Patient returns for follow-up in cardiology clinic.      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    She still remains very reluctant to undergo cardiac surgery for anomalous coronary artery as recommended by CT Surgery at [facility] (Dr. [place] Bloom).    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 No CP/throat tightness/SOB. No LH, syncope, or presyncope. No edema. No orthopnea or PND.    Lost 10 lbs since May 2024.    Since July 2024, she repots 1 episode of palpitations about 3 weeks ago - she woke up in the middle of the night to urinate - when she got up out of bed and stood up, she developed onset of rapid onset heart racing. She sat down and felt her legs a bit weak. No syncope or presyncope. No associated CP. She did Valsava maneuver and SVT terminated within ~30 seconds. She believes episode may have been triggered by increased stress.    Current Medications:  dulaglutide (TRULICITY) 1.5 MG/0.5ML sc pen-injector, Inject 1.5 mg under the skin once a week, Disp: 2 mL, Rfl: 11  DULoxetine (CYMBALTA) 60 MG capsule, Take 1 capsule by mouth daily, Disp: 90 capsule, Rfl: 3  metoprolol (TOPROL-XL) 100 MG 24 hr tablet, Take 1 tablet by mouth daily with dinner, Disp: 90 tablet, Rfl: 3  cholecalciferol (VITAMIN D3) 2000 UNIT tablet, Take 1 tablet by mouth in the morning., Disp: 90 tablet, Rfl: 3  omeprazole (PRILOSEC) 20 MG capsule, TAKE 1  CAPSULE BY MOUTH IN THE MORNING., Disp: 90 capsule, Rfl: 3    No current facility-administered medications on file prior to visit.      Review of Patient's Allergies indicates:   Naproxen                Swelling    Comment:Ed visit with angioedema   Ibuprofen               Other (See Comments)    Comment:Epigastric pain, throat swelling?             05/06/12 pt states uses sometimes without issue   Morphine                Other (See Comments)    Comment:"THE LAST TIME I WAS HERE, THEY GAVE IT TO ME AND             TOLD ME TO TELL THE DOCTOR NOT TO GIVE IT TO ME             ANYMORE. IT MADE ME ACT DIFFERENT".   Pollen extract-tree*    Runny Nose    Comment:HA, itchy watery eyes    FAMILY HISTORY: Father has AFib. Mother is diabetes and has HTN.     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 Lives with adult son, father and mother. No cigs, EtOH, or drugs.  ROS: All other systems were pertinently reviewed and were negative unless otherwise outlined above.    PHYSICAL EXAM:  General: Obese; normal appearance for age; no apparent distress   04/17/23  1455   BP: 124/81   Site: Left Arm   Position: Sitting   Cuff Size: Regular   Pulse: 79   Temp: 96.2 F (35.7 C)   TempSrc: Temporal   SpO2: 98%   Weight: 82.6 kg (182 lb)     HEENT: Oral mucosa is moist, no icterus, no pallor noted.  NECK: Supple. No thyromegaly.  CHEST: Clear to auscultation, no crackles or rhonchi. No wheezes.  HEART: Normal JVP. Carotid pulse are +2 bilaterally with normal upstrokes and no bruits. Regular rate and rhythm. No murmurs, rubs, or gallops.   ABDOMEN: Soft, no organomegaly detected. No abdominal bruits.  EXTREMITIES: Warm and well perfused. Peripheral pulses are +2 bilaterally in both upper and lower extremities. There is no evidence of peripheral edema.    PSYCHIATRIC: Mood and affect are appropriate.  SKIN: No rash observed.    PERTINENT INVESTIGATIONS:  1. EKG today (on my personal review):  Sinus rhythm with 76 bpm. Normal intervals. Normal QRS axis. Normal tracing. Compared to ECG from 10/10/2022, no significant change.  2. LABS:   Lab Results   Component Value Date    NA 141 08/14/2022    K 4.5 08/14/2022    CL 103 08/14/2022    CO2 26 08/14/2022    BUN 16 08/14/2022    CREAT 0.8 08/14/2022    GLUCOSER 91 08/14/2022     Lab Results   Component Value Date    LDL 144 04/29/2021    HDL 55 04/29/2021    TG 124 04/29/2021     ECHO 02/22/2016:  1. LV ejection fraction is 60%.     2. There are no regional wall motion abnormalities.     3. Normal study.     4. Compared to the previous study dated 04/18/11, there is no significant change.     30-Day Event Monitor Aug/Sept 2021:  Baseline rhythm at sinus at 76 bpm. There were no symptomatic recordings. There were 8 auto-triggered recordings which showed sinus at 47-138 bpm with no ectopy.     Stress Echo 09/03/2020:  1. Bruce Protocol to 5 minutes and 7 METS, achieving 141 BPM = 85% max predicted. Good double product of 24. Exercise capacity was 95% of normal.   2. 5/10 non-pleuritic throat pressure typical of that of chief complaint developed during exercise and subsided over 5 minutes of recovery.   3. No ST changes developed.   4. LV function was normal by echo at rest and with exertion, with no regional wall motion abnormalities.   5. No evidence of inducible ischemia.     Coronary CTA 05/04/2021 [facility]):  Anomalous RCA which originates from the Left Main coronary artery with an interarterial course. No CAD.     14-Day Zio Patch 02/03/22-02/16/22:  The predominant rhythm was Sinus Rhythm with average HR 79 bpm.  There were no patient triggered recordings. There were no diary entries.  There was 1 auto-triggered (asymptomatic) run of supraventricular tachycardia (SVT) lasting 18 beats with a max rate of 174 bpm (avg 165 bpm).   There were 4 isolated PVCs.  There were 75 isolated PACs, 3 atrial couplets, and no atrial triplets.       ASSESSMENT:  58 year old  woman with preserved LVEF and normal baseline ECG, h/o SVT diagnosed in  March 2010 (suppressed on Toprol XL) who was diagnosed with an anomalous RCA on coronary CTA Jan 2023 (originating from LMCA w/ interarterial course) after she developed increased DOE/exertional throat tightness since March 2022. She was evaluated by Dr. Leonette Most from Cardiology at [facility] and by Dr. Wilma Flavin by CT surgery at [facility] in May 2023 and surgical correction of her RCA was recommended. While she understands the risk of sudden death with her condition, she is afraid of having surgery and continues to decline surgery.    Clinically, she is doing well - No recurrent symptoms of sig DOE/throat tightness.    Has only rare recurrences of palpitations lasting <1 min; she wishes to continue Med Rx with Toprol XL unless symptoms progress.    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 She demonstrates good understanding and all questions answered. She is declines surgery.  (Z68.34) BMI 34.0-34.9,adult  The 10-year ASCVD risk score (Arnett DK, et al., 2019) is: 3%    Values used to calculate the score:      Age: 24 years      Sex: Female      Is Non-Hispanic African American: No      Diabetic: No      Tobacco smoker: No      Systolic Blood Pressure: 124 mmHg      Is BP treated: No      HDL Cholesterol: 55 mg/dL      Total Cholesterol: 216 mg/dL  Plan:   1. She knows to call 911 for any progressive symptoms of angina, SOB, syncope, or presyncope.  2. Continue efforts in weight loss.  3. At next blood draw, would obtain repeat fasting lipids - in the meantime, continue low fat low cholesterol diet.    (I47.1) SVT To 240 bpm (diagnosed March 2010);  on Toprol XL; recurrent symptoms relieved w/ vagal maneuvers  Comment: Suspect AVNRT although I cannot retrieve her SVT ECG from March 2010.  Plan:   1. Continue Toprol XL 100 mg qPM.  2. If symptoms progress, will consider SVT ablation.      Return for follow up in Cardiology Clinic in 6 months. To contact earlier if there are any cardiac related issues.  I spent a total of 34 minutes on this visit on the date of service (total time includes all activities performed on the date of service).    This Cardiology Division patient encounter note was created using voice-recognition software and in real time during the clinic visit. Please excuse any typographical errors that have not been edited out.     Electronically signed by: Lurena Joiner, MD, 04/17/2023 11:00 PM

## 2023-04-22 LAB — EKG

## 2023-04-23 ENCOUNTER — Ambulatory Visit (HOSPITAL_BASED_OUTPATIENT_CLINIC_OR_DEPARTMENT_OTHER): Payer: 59

## 2023-05-07 ENCOUNTER — Ambulatory Visit: Payer: 59 | Attending: Internal Medicine

## 2023-05-07 ENCOUNTER — Other Ambulatory Visit: Payer: Self-pay

## 2023-05-07 VITALS — BP 122/82 | Temp 97.8°F | Ht 61.02 in | Wt 179.0 lb

## 2023-05-07 DIAGNOSIS — E559 Vitamin D deficiency, unspecified: Secondary | ICD-10-CM | POA: Insufficient documentation

## 2023-05-07 DIAGNOSIS — M170 Bilateral primary osteoarthritis of knee: Secondary | ICD-10-CM | POA: Insufficient documentation

## 2023-05-07 DIAGNOSIS — R8290 Unspecified abnormal findings in urine: Secondary | ICD-10-CM | POA: Insufficient documentation

## 2023-05-07 DIAGNOSIS — Z6835 Body mass index (BMI) 35.0-35.9, adult: Secondary | ICD-10-CM | POA: Diagnosis not present

## 2023-05-07 DIAGNOSIS — E669 Obesity, unspecified: Secondary | ICD-10-CM | POA: Diagnosis present

## 2023-05-07 DIAGNOSIS — K219 Gastro-esophageal reflux disease without esophagitis: Secondary | ICD-10-CM | POA: Insufficient documentation

## 2023-05-07 LAB — POC URINALYSIS
BILIRUBIN, URINE: NEGATIVE
GLUCOSE,URINE: NEGATIVE
KETONE, URINE: NEGATIVE
LEUKOCYTE ESTERASE: NEGATIVE
NITRITE, URINE: NEGATIVE
PH URINE: 6 (ref 5.0–8.0)
PROTEIN, URINE: NEGATIVE
SPECIFIC GRAVITY, URINE: 1.02 (ref 1.003–1.030)
UROBILINOGEN URINE: 0.2 (ref 0.2–1.0)

## 2023-05-07 MED ORDER — DICLOFENAC SODIUM 1 % EX GEL
4.0000 g | Freq: Four times a day (QID) | CUTANEOUS | 3 refills | Status: DC
Start: 2023-05-07 — End: 2023-08-01

## 2023-05-07 MED ORDER — TRIAMCINOLONE ACETONIDE 0.5 % EX CREA
TOPICAL_CREAM | Freq: Three times a day (TID) | CUTANEOUS | 0 refills | Status: AC
Start: 2023-05-07 — End: 2023-05-17

## 2023-05-07 MED ORDER — VITAMIN D 50 MCG (2000 UT) PO TABS
1.0000 | ORAL_TABLET | Freq: Every day | ORAL | 3 refills | Status: DC
Start: 2023-05-07 — End: 2023-06-11

## 2023-05-07 MED ORDER — OMEPRAZOLE 20 MG PO CPDR
20.0000 mg | DELAYED_RELEASE_CAPSULE | Freq: Every day | ORAL | 3 refills | Status: AC
Start: 2023-05-07 — End: 2024-05-06

## 2023-05-07 MED ORDER — DULAGLUTIDE 3 MG/0.5ML SC SOAJ
3.0000 mg | SUBCUTANEOUS | 2 refills | Status: DC
Start: 2023-05-07 — End: 2023-06-11

## 2023-05-07 NOTE — Progress Notes (Signed)
HPI    Deborah [name] is a 58 year old female who presents for f/u weight management with dulaglutide 1.5mg  weekly, increased from 0.75 mg weekly on 01/29/23. She is taking it as directed, denies missed doses, denies side effects, weight is down 3 lbs - it was down more but she regained 5 lbs on vacation recently. Dr. Rod Holler noticed a higher heart rate at 04/17/23 cards f/u and was wondering if the Trulicity was the cause.    Has been noticing a strong odor to urine, especially at night. No frequency, dysuria, leakage. No h/o UTI but does have h/o stones. No new supplements. Drinks water throughout the day.     Needs several med refills.         Current Outpatient Medications:     dulaglutide (TRULICITY) 3 MG/0.5ML sc auto-injector, Inject 3 mg under the skin once a week, Disp: 4 each, Rfl: 2    triamcinolone (KENALOG) 0.5 % cream, Apply topically 3 (three) times daily  for 10 days, Disp: 30 g, Rfl: 0    diclofenac (VOLTAREN) 1 % GEL Gel, Apply 4 g topically in the morning and 4 g at noon and 4 g in the evening and 4 g before bedtime., Disp: 100 g, Rfl: 3    cholecalciferol (VITAMIN D3) 2000 UNIT tablet, Take 1 tablet by mouth daily, Disp: 90 tablet, Rfl: 3    omeprazole (PRILOSEC) 20 MG capsule, Take 1 capsule by mouth daily, Disp: 90 capsule, Rfl: 3    DULoxetine (CYMBALTA) 60 MG capsule, Take 1 capsule by mouth daily, Disp: 90 capsule, Rfl: 3    metoprolol (TOPROL-XL) 100 MG 24 hr tablet, Take 1 tablet by mouth daily with dinner, Disp: 90 tablet, Rfl: 3    Review of Patient's Allergies indicates:   Naproxen                Swelling    Comment:Ed visit with angioedema   Ibuprofen               Other (See Comments)    Comment:Epigastric pain, throat swelling?             05/06/12 pt states uses sometimes without issue   Morphine                Other (See Comments)    Comment:"THE LAST TIME I WAS HERE, THEY GAVE IT TO ME AND             TOLD ME TO TELL THE DOCTOR NOT TO GIVE IT TO ME             ANYMORE. IT  MADE ME ACT DIFFERENT".   Pollen extract-tree*    Runny Nose    Comment:HA, itchy watery eyes    Past Medical History:  04/04/2006: Abdominal pain, epigastric      Comment:  Nml LFTs, Hep A/B/C serologies neg; RUQ U/S shows no                hepatobiliary abnormalities (completely normal)  Seen by                GI 3/08 with evaluation pending  12/24/2020: Age-related nuclear cataract of both eyes      Comment:  Mild, early   06/30/2014: Bilateral arm pain  03/15/2015: Chronic right-sided low back pain with right-sided sciatica  02/12/2019: Covid Infection March 2021 -- Ill But Not Hosp'd.      Comment:  Risk category:   Low Tested?  Positive Community managed:               Principal risk manager: Requires additional outreach: No                Risk factors: Obesity, latinX  Clinical Course First date               of symptoms:    06/18/19 06/27/19: (DOI 9): CM Result call               - tested positive at outside location (3/15). Overall her               sx have greatly improved, main complaint is nasal                congestion. Previously had sore throat, fevers, and cough  02/22/2008: CTS (Carpal Tunnel Syndrome), b/l      Comment:  Seen by ortho 02/21/08, referred back to physiatry for                EMG  02/22/2008: De Quervain's Tenosynovitis, R      Comment:  Seen by ortho 02/21/08 referred to OT/splinting  No date: Esophageal reflux  10/07/2008: Greater Trochanteric Bursitis, b/l      Comment:  Seen by physiatry with L steroid injection  No date: Heart disease  05/18/2017: Hyperopia of both eyes with astigmatism and presbyopia  No date: Irregular menstrual cycle  06/06/2017: Lateral epicondylitis of right elbow  11/12/2019: Left ureteral stone  11/14/2006: Leg pain      Comment:  Intermittent, with fatigue, subj weakness; elevated ESR,               but nml CRP/TSH/Ferritin/ANA  Seen by physiatry 09/28/08                with MRI pending  02/25/2013: Lump of skin of lower extremity      Comment:  Dermatofibroma R foot  on biopsy  11/10/2004: Lump or mass in breast  05/18/2017: Macular chorioretinal scar of left eye      Comment:  Temporal to fovea, left eye  07/27/2006: Plantar Fasciitis, L      Comment:  S/p cortisone injxn with podiatry 07/25/06  06/30/2014: Positive occult stool blood test  No date: Pregnant state, incidental      Comment:  c/s breech  06/06/2017: Right shoulder injury  08/08/2007: Right subacromial bursitis/rotator cuff tendinopathy      Comment:  S/p steroid injection with physiatry 07/08/08  3/10: SVT (supraventricular tachycardia) (HCC)      Comment:  [facility]  08/08/2007: Triggering of Finger, R 3rd      Comment:  Seen by Dr. Olam Idler 02/21/08 s/p steroid injection; again               10/07/08    Patient Active Problem List:     Gastroesophageal reflux disease     Other specified gastritis     Allergy to naproxen     Chondromalacia patellae     Lower back pain     Family history of diabetes mellitus (DM)     Intermittent spinal claudication (HCC)     Vitamin D deficiency     DDD (degenerative disc disease), lumbar     Bilateral chronic knee pain     SVT To 240 bpm (diagnosed March 2010); on Toprol XL; recurrent symptoms relieved w/ vagal maneuvers     BMI 35.0-35.9,adult     Macular chorioretinal scar of left  eye     Hyperopia of both eyes with astigmatism and presbyopia     Osteoarthritis of both knees     Trigger middle finger of right hand     Primary osteoarthritis of first carpometacarpal joint of left hand     Chronic right shoulder pain     History of kidney stones     Age-related nuclear cataract of both eyes     Exertional angina (HCC) (anomalous RCA)     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    Physical Exam    BP 122/82   Temp 97.8 F (36.6 C) (Temporal)   Ht 5' 1.02" (1.55 m)   Wt 81.2 kg (179 lb)   LMP 10/17/2006   BMI 33.80 kg/m     Most Recent Weight Reading(s)  05/07/23 : 81.2 kg (179 lb)  04/17/23 : 82.6 kg (182 lb)  01/29/23 : 84.4 kg (186 lb)  12/25/22 : 83.5 kg (184 lb)  11/27/22 : 85.7 kg (189 lb)    Constitutional: Alert and oriented, appears well, no acute distress    Assessment and Plan    Tyjanae was seen today for weight check.    Diagnoses and all orders for this visit:    BMI 35.0-35.9,adult  Comments:  Increase Trulicity to 3 mg/wk, monitor HR on her pulse ox at home and call if >100. Keep log.  Orders:  -     LIPID PANEL  -     dulaglutide (TRULICITY) 3 MG/0.5ML sc auto-injector; Inject 3 mg under the skin once a week    Abnormal urine odor  Comments:  r/o UTI  Orders:  -     URINE DIPSTICK    Osteoarthritis of both knees, unspecified osteoarthritis type  -     diclofenac (VOLTAREN) 1 % GEL Gel; Apply 4 g topically in the morning and 4 g at noon and 4 g in the evening and 4 g before bedtime.    Vitamin D deficiency  -     cholecalciferol (VITAMIN D3) 2000 UNIT tablet; Take 1 tablet by mouth daily    Gastroesophageal reflux disease without esophagitis  -     omeprazole (PRILOSEC) 20 MG capsule; Take 1 capsule by mouth daily    Other orders  -     triamcinolone (KENALOG) 0.5 % cream; Apply topically 3 (three) times daily  for 10 days  -     POC URINALYSIS     Increase Trulicity to 3 mg/week, f/u in 1 month, sooner with concerns.    Serena Colonel, CNP

## 2023-05-31 ENCOUNTER — Ambulatory Visit: Payer: 59

## 2023-05-31 NOTE — Progress Notes (Signed)
 INITIAL NUTRITION ASSESSMENT / INTERVENTION    Conducted with [place] interpreter [phone]. Called patient and she stated she needed to reschedule for a Monday time slot due to her work schedule. Rescheduled to 3/10 at 9am via telehealth.

## 2023-06-11 ENCOUNTER — Ambulatory Visit: Payer: 59 | Attending: Internal Medicine

## 2023-06-11 ENCOUNTER — Other Ambulatory Visit: Payer: Self-pay

## 2023-06-11 VITALS — BP 118/82 | HR 87 | Temp 97.0°F | Ht 61.02 in | Wt 180.0 lb

## 2023-06-11 DIAGNOSIS — Z1211 Encounter for screening for malignant neoplasm of colon: Secondary | ICD-10-CM | POA: Insufficient documentation

## 2023-06-11 DIAGNOSIS — E669 Obesity, unspecified: Secondary | ICD-10-CM | POA: Insufficient documentation

## 2023-06-11 DIAGNOSIS — E559 Vitamin D deficiency, unspecified: Secondary | ICD-10-CM | POA: Insufficient documentation

## 2023-06-11 DIAGNOSIS — Z6835 Body mass index (BMI) 35.0-35.9, adult: Secondary | ICD-10-CM | POA: Diagnosis not present

## 2023-06-11 MED ORDER — VITAMIN D 50 MCG (2000 UT) PO TABS
1.0000 | ORAL_TABLET | Freq: Every day | ORAL | 3 refills | Status: AC
Start: 2023-06-11 — End: 2024-06-10

## 2023-06-11 MED ORDER — DULAGLUTIDE 4.5 MG/0.5ML SC SOAJ
4.5000 mg | SUBCUTANEOUS | 2 refills | Status: DC
Start: 2023-06-11 — End: 2023-08-01

## 2023-06-11 NOTE — Progress Notes (Signed)
 HPI    The patient verbally consented to an audio recording of their visit to assist with the completion of documentation. The patient is aware the recording is not retained after the visit is summarized.     Deborah [name] is a 58 year old female     Has not had any sustained weight loss on dulaglutide, now at 3 mg weekly dose, but does feel like clothes are fitting better and other people have said she looks like she's losing weight.     Denies repeated episodes of elevated HR so she didn't keep track.      Current Outpatient Medications:     dulaglutide (TRULICITY) 3 MG/0.5ML sc auto-injector, Inject 3 mg under the skin once a week, Disp: 4 each, Rfl: 2    diclofenac (VOLTAREN) 1 % GEL Gel, Apply 4 g topically in the morning and 4 g at noon and 4 g in the evening and 4 g before bedtime., Disp: 100 g, Rfl: 3    cholecalciferol (VITAMIN D3) 2000 UNIT tablet, Take 1 tablet by mouth daily, Disp: 90 tablet, Rfl: 3    omeprazole (PRILOSEC) 20 MG capsule, Take 1 capsule by mouth daily, Disp: 90 capsule, Rfl: 3    DULoxetine (CYMBALTA) 60 MG capsule, Take 1 capsule by mouth daily, Disp: 90 capsule, Rfl: 3    metoprolol (TOPROL-XL) 100 MG 24 hr tablet, Take 1 tablet by mouth daily with dinner, Disp: 90 tablet, Rfl: 3    Review of Patient's Allergies indicates:   Naproxen                Swelling    Comment:Ed visit with angioedema   Ibuprofen               Other (See Comments)    Comment:Epigastric pain, throat swelling?             05/06/12 pt states uses sometimes without issue   Morphine                Other (See Comments)    Comment:"THE LAST TIME I WAS HERE, THEY GAVE IT TO ME AND             TOLD ME TO TELL THE DOCTOR NOT TO GIVE IT TO ME             ANYMORE. IT MADE ME ACT DIFFERENT".   Pollen extract-tree*    Runny Nose    Comment:HA, itchy watery eyes    Past Medical History:  04/04/2006: Abdominal pain, epigastric      Comment:  Nml LFTs, Hep A/B/C serologies neg; RUQ U/S shows no                 hepatobiliary abnormalities (completely normal)  Seen by                GI 3/08 with evaluation pending  12/24/2020: Age-related nuclear cataract of both eyes      Comment:  Mild, early   06/30/2014: Bilateral arm pain  03/15/2015: Chronic right-sided low back pain with right-sided sciatica  02/12/2019: Covid Infection March 2021 -- Ill But Not Hosp'd.      Comment:  Risk category:   Low Tested? Positive Community managed:               Principal risk manager: Requires additional outreach: No                Risk factors: Obesity, latinX  Clinical Course  First date               of symptoms:    06/18/19 06/27/19: (DOI 9): CM Result call               - tested positive at outside location (3/15). Overall her               sx have greatly improved, main complaint is nasal                congestion. Previously had sore throat, fevers, and cough  02/22/2008: CTS (Carpal Tunnel Syndrome), b/l      Comment:  Seen by ortho 02/21/08, referred back to physiatry for                EMG  02/22/2008: De Quervain's Tenosynovitis, R      Comment:  Seen by ortho 02/21/08 referred to OT/splinting  No date: Esophageal reflux  10/07/2008: Greater Trochanteric Bursitis, b/l      Comment:  Seen by physiatry with L steroid injection  No date: Heart disease  05/18/2017: Hyperopia of both eyes with astigmatism and presbyopia  No date: Irregular menstrual cycle  06/06/2017: Lateral epicondylitis of right elbow  11/12/2019: Left ureteral stone  11/14/2006: Leg pain      Comment:  Intermittent, with fatigue, subj weakness; elevated ESR,               but nml CRP/TSH/Ferritin/ANA  Seen by physiatry 09/28/08                with MRI pending  02/25/2013: Lump of skin of lower extremity      Comment:  Dermatofibroma R foot on biopsy  11/10/2004: Lump or mass in breast  05/18/2017: Macular chorioretinal scar of left eye      Comment:  Temporal to fovea, left eye  07/27/2006: Plantar Fasciitis, L      Comment:  S/p cortisone injxn with podiatry 07/25/06  06/30/2014:  Positive occult stool blood test  No date: Pregnant state, incidental      Comment:  c/s breech  06/06/2017: Right shoulder injury  08/08/2007: Right subacromial bursitis/rotator cuff tendinopathy      Comment:  S/p steroid injection with physiatry 07/08/08  3/10: SVT (supraventricular tachycardia) (HCC)      Comment:  [facility]  08/08/2007: Triggering of Finger, R 3rd      Comment:  Seen by Dr. Olam Idler 02/21/08 s/p steroid injection; again               10/07/08    Patient Active Problem List:     Gastroesophageal reflux disease     Other specified gastritis     Allergy to naproxen     Chondromalacia patellae     Lower back pain     Family history of diabetes mellitus (DM)     Intermittent spinal claudication (HCC)     Vitamin D deficiency     DDD (degenerative disc disease), lumbar     Bilateral chronic knee pain     SVT To 240 bpm (diagnosed March 2010); on Toprol XL; recurrent symptoms relieved w/ vagal maneuvers     BMI 35.0-35.9,adult     Macular chorioretinal scar of left eye     Hyperopia of both eyes with astigmatism and presbyopia     Osteoarthritis of both knees     Trigger middle finger of right hand     Primary osteoarthritis of first carpometacarpal joint of left hand  Chronic right shoulder pain     History of kidney stones     Age-related nuclear cataract of both eyes     Exertional angina (HCC) (anomalous RCA)     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    Physical Exam    BP 118/82   Pulse 87   Temp 97 F (36.1 C) (Temporal)   Ht 5' 1.02" (1.55 m)   Wt 81.6 kg (180 lb)   LMP 10/17/2006   SpO2 97%   BMI 33.98 kg/m     Most Recent Weight  Reading(s)  06/11/23 : 81.6 kg (180 lb)  05/07/23 : 81.2 kg (179 lb)  04/17/23 : 82.6 kg (182 lb)  01/29/23 : 84.4 kg (186 lb)  12/25/22 : 83.5 kg (184 lb)    Constitutional: Alert and oriented, appears well, no acute distress  Heart RRR, no tachycardia    Assessment and Plan    Assessment & Plan  BMI 35.0-35.9,adult   Suboptimal control. Plan to increase Trulicity to 4.5mg  weekly.  -Increase Trulicity to 4.5mg  weekly.  -Return in 1 month to assess response to increased dose.Switch to tirzepatide if no sustained weight loss.     Vitamin D Deficiency  Patient reports running out of Vitamin D.  -Refill Vitamin D prescription.    Tachycardia  No recent episodes reported.  -If episodes recur, patient to count heart rate and report at next visit.    Immunizations  Patient declined COVID-19 and shingles vaccines at this visit.  -Offer vaccines again at next visit.    Follow-up in 1 month to assess response to increased Trulicity dose and discuss potential medication changes.      Serena Colonel, CNP

## 2023-06-15 NOTE — Progress Notes (Unsigned)
 INITIAL NUTRITION ASSESSMENT / INTERVENTION     Total Time: {Minutes Spent Nutrition Visit:20119}    Home Clinic: [address] [facility] [place] / [place]*  [phone]    Deborah [name] is a 58 year old female who presents with desire for weight loss, currently on Trulicty with pmh of GERD, vitamin D deficiency, and kidney stones.  {INTERPRETER:10137}.  Patient {OCCUPATION:11953}  Patient lives {LIVING SITUATION:10140}.     Subjective: Patient reports ***      Review of patient's family history indicates:  Problem: Hypertension      Relation: Mother          Age of Onset: (Not Specified)  Problem: Diabetes      Relation: Mother          Age of Onset: (Not Specified)  Problem: Heart      Relation: Father          Age of Onset: (Not Specified)          Comment: arrhythmia  Problem: Hypertension      Relation: Father          Age of Onset: (Not Specified)  Problem: Heart      Relation: Maternal Grandmother          Age of Onset: (Not Specified)  Problem: Diabetes      Relation: Maternal Uncle          Age of Onset: (Not Specified)  Problem: Diabetes      Relation: Maternal Uncle          Age of Onset: (Not Specified)  Problem: Glaucoma      Relation: Maternal Uncle          Age of Onset: (Not Specified)  Problem: Heart      Relation: Maternal Uncle          Age of Onset: (Not Specified)  Problem: Heart      Relation: Maternal Uncle          Age of Onset: (Not Specified)  Problem: Cancer - Ovarian      Relation: Paternal Aunt          Age of Onset: 12  Problem: Cancer - Other      Relation: FamHxNeg          Age of Onset: (Not Specified)  Problem: Cancer - Breast      Relation: FamHxNeg          Age of Onset: (Not Specified)  Problem: Cancer - Colon      Relation: FamHxNeg          Age of Onset: (Not Specified)  Problem: Cancer - Lung      Relation: FamHxNeg          Age of Onset: (Not Specified)        Patient has had *** for {TIME-MONTHS-YEARS:10139} and {NUTRITION THERAPY:11139}    Current  Outpatient Medications   Medication Sig    dulaglutide (TRULICITY) 4.5 MG/0.5ML sc auto-injector Inject 4.5 mg under the skin once a week    cholecalciferol (VITAMIN D3) 2000 UNIT tablet Take 1 tablet by mouth daily    diclofenac (VOLTAREN) 1 % GEL Gel Apply 4 g topically in the morning and 4 g at noon and 4 g in the evening and 4 g before bedtime.    omeprazole (PRILOSEC) 20 MG capsule Take 1 capsule by mouth daily    DULoxetine (CYMBALTA) 60 MG capsule Take 1 capsule by mouth  daily    metoprolol (TOPROL-XL) 100 MG 24 hr tablet Take 1 tablet by mouth daily with dinner     No current facility-administered medications for this visit.       Vitamins/Minerals/Herbal Supplements: {VITAMINS, RUEAVW:09811}    Social History    Tobacco Use      Smoking status: Former        Packs/day: 0.00        Years: 0.5 packs/day for 27.0 years (13.5 ttl pk-yrs)        Types: Cigarettes        Start date: 06/28/1975        Quit date: 06/28/2002        Years since quitting: 20.9      Smokeless tobacco: Never    Alcohol use: No      Alcohol/week: 0.0 standard drinks of alcohol      Review of Patient's Allergies indicates:   Naproxen                Swelling    Comment:Ed visit with angioedema   Ibuprofen               Other (See Comments)    Comment:Epigastric pain, throat swelling?             05/06/12 pt states uses sometimes without issue   Morphine                Other (See Comments)    Comment:"THE LAST TIME I WAS HERE, THEY GAVE IT TO ME AND             TOLD ME TO TELL THE DOCTOR NOT TO GIVE IT TO ME             ANYMORE. IT MADE ME ACT DIFFERENT".   Pollen extract-tree*    Runny Nose    Comment:HA, itchy watery eyes    Labs reviewed: { :11402}    Lab Results   Component Value Date    HGB 13.1 08/14/2022     Lab Results   Component Value Date    HCT 40.8 08/14/2022     Lab Results   Component Value Date    HGBA1C 5.5 08/14/2022       Lab Results   Component Value Date    CHOLESTEROL 216 04/29/2021     Lab Results   Component Value Date     LDL 144 04/29/2021     Lab Results   Component Value Date    HDL 55 04/29/2021     Lab Results   Component Value Date    TG 124 04/29/2021       Lab Results   Component Value Date    GLUCOSER 91 08/14/2022            Vitals:  Most Recent BP Reading(s)  06/11/23 : 118/82            Most Recent Height Reading(s)  06/11/23 : 5' 1.02" (1.55 m)          Most Recent Weight Reading(s)  06/11/23 : 81.6 kg (180 lb)  05/07/23 : 81.2 kg (179 lb)  04/17/23 : 82.6 kg (182 lb)  01/29/23 : 84.4 kg (186 lb)  12/25/22 : 83.5 kg (184 lb)    Weight change: {WEIGHT CHANGE:11147}    Estimated body mass index is 33.98 kg/m as calculated from the following:    Height as of 06/11/23: 5' 1.02" (1.55 m).    Weight as  of 06/11/23: 81.6 kg (180 lb).  BMI Category: {BMI CATEGORY:10141}    Highest weight: ***       Lowest weight: ***       Desired weight: ***         Physical Activity:  Type: {ACTIVITY TYPE:10142}     Barriers to physical activity include: {BARRIERS TO PHYSICAL ACTIVITY:10143}    Activities of Daily Living: {ACITVITY OF DAILY LIVING:11579}    Chewing ability: {:11954}   Swallowing ability: {:11954}   Appetite: ***    Food Allergies: {FOOD ALLERGIES:10798}  Food Intolerances: ***       Religious restrictions/Special diet: {SPECIAL DIET:10144}  Food purchased by: ***       Food prepared by: ***  Meal sources: {MEAL SOURCES:10145}  Economic factors and food assist: ***  Psychosocial factors / Mental Status: {MENTAL STATUS:11582}  Stress: {:11580}  Readiness to learn: ***    Diet Recall:        Portion size is {QUANTIFIER:10266}.  Eating frequency is {QUANTIFIER:10266}  Eating habits {EATING HABITS:10267}    Estimated intake shows: {NUTRIENT TYPE:10154}    Assessment/Plan/Conclusion:  Discussed with patient ***    Barriers to dietary changes include: {BARRIERS:10257}    Patient's goals: ***    Material provided:***    Future educational needs: ***    Referred to: {NUTRITION REFERRALS:10265}    Follow-up: {FOLLOW  UP:10156}        Danae Orleans, MS, RDN

## 2023-06-18 ENCOUNTER — Ambulatory Visit: Payer: 59

## 2023-06-18 DIAGNOSIS — E66811 Obesity, class 1: Secondary | ICD-10-CM | POA: Diagnosis present

## 2023-07-16 ENCOUNTER — Ambulatory Visit (HOSPITAL_BASED_OUTPATIENT_CLINIC_OR_DEPARTMENT_OTHER)

## 2023-07-16 ENCOUNTER — Other Ambulatory Visit: Payer: Self-pay

## 2023-07-16 NOTE — Progress Notes (Deleted)
 HPI    The patient verbally consented to an audio recording of their visit to assist with the completion of documentation. The patient is aware the recording is not retained after the visit is summarized.     Kiela Shisler is a 58 year old female here for f/u weight management. Unfortunately [facility] stopped paying for any weight loss agents as of 07/10/23.    History of Present Illness      Health Maintenance due:      Current Outpatient Medications:     dulaglutide (TRULICITY) 4.5 MG/0.5ML sc auto-injector, Inject 4.5 mg under the skin once a week, Disp: 4 each, Rfl: 2    cholecalciferol (VITAMIN D3) 2000 UNIT tablet, Take 1 tablet by mouth daily, Disp: 90 tablet, Rfl: 3    diclofenac (VOLTAREN) 1 % GEL Gel, Apply 4 g topically in the morning and 4 g at noon and 4 g in the evening and 4 g before bedtime., Disp: 100 g, Rfl: 3    omeprazole (PRILOSEC) 20 MG capsule, Take 1 capsule by mouth daily, Disp: 90 capsule, Rfl: 3    DULoxetine (CYMBALTA) 60 MG capsule, Take 1 capsule by mouth daily, Disp: 90 capsule, Rfl: 3    metoprolol (TOPROL-XL) 100 MG 24 hr tablet, Take 1 tablet by mouth daily with dinner, Disp: 90 tablet, Rfl: 3    Review of Patient's Allergies indicates:   Naproxen                Swelling    Comment:Ed visit with angioedema   Ibuprofen               Other (See Comments)    Comment:Epigastric pain, throat swelling?             05/06/12 pt states uses sometimes without issue   Morphine                Other (See Comments)    Comment:"THE LAST TIME I WAS HERE, THEY GAVE IT TO ME AND             TOLD ME TO TELL THE DOCTOR NOT TO GIVE IT TO ME             ANYMORE. IT MADE ME ACT DIFFERENT".   Pollen extract-tree*    Runny Nose    Comment:HA, itchy watery eyes    Past Medical History:  04/04/2006: Abdominal pain, epigastric      Comment:  Nml LFTs, Hep A/B/C serologies neg; RUQ U/S shows no                hepatobiliary abnormalities (completely normal)  Seen by                GI 3/08 with evaluation  pending  12/24/2020: Age-related nuclear cataract of both eyes      Comment:  Mild, early   06/30/2014: Bilateral arm pain  03/15/2015: Chronic right-sided low back pain with right-sided sciatica  02/12/2019: Covid Infection March 2021 -- Ill But Not Hosp'd.      Comment:  Risk category:   Low Tested? Positive Community managed:               Principal risk manager: Requires additional outreach: No                Risk factors: Obesity, latinX  Clinical Course First date               of symptoms:  06/18/19 06/27/19: (DOI 9): CM Result call               - tested positive at outside location (3/15). Overall her               sx have greatly improved, main complaint is nasal                congestion. Previously had sore throat, fevers, and cough  02/22/2008: CTS (Carpal Tunnel Syndrome), b/l      Comment:  Seen by ortho 02/21/08, referred back to physiatry for                EMG  02/22/2008: De Quervain's Tenosynovitis, R      Comment:  Seen by ortho 02/21/08 referred to OT/splinting  No date: Esophageal reflux  10/07/2008: Greater Trochanteric Bursitis, b/l      Comment:  Seen by physiatry with L steroid injection  No date: Heart disease  05/18/2017: Hyperopia of both eyes with astigmatism and presbyopia  No date: Irregular menstrual cycle  06/06/2017: Lateral epicondylitis of right elbow  11/12/2019: Left ureteral stone  11/14/2006: Leg pain      Comment:  Intermittent, with fatigue, subj weakness; elevated ESR,               but nml CRP/TSH/Ferritin/ANA  Seen by physiatry 09/28/08                with MRI pending  02/25/2013: Lump of skin of lower extremity      Comment:  Dermatofibroma R foot on biopsy  11/10/2004: Lump or mass in breast  05/18/2017: Macular chorioretinal scar of left eye      Comment:  Temporal to fovea, left eye  07/27/2006: Plantar Fasciitis, L      Comment:  S/p cortisone injxn with podiatry 07/25/06  06/30/2014: Positive occult stool blood test  No date: Pregnant state, incidental      Comment:  c/s  breech  06/06/2017: Right shoulder injury  08/08/2007: Right subacromial bursitis/rotator cuff tendinopathy      Comment:  S/p steroid injection with physiatry 07/08/08  3/10: SVT (supraventricular tachycardia) (HCC)      Comment:  [facility]  08/08/2007: Triggering of Finger, R 3rd      Comment:  Seen by Dr. Olam Idler 02/21/08 s/p steroid injection; again               10/07/08    Patient Active Problem List:     Gastroesophageal reflux disease     Other specified gastritis     Allergy to naproxen     Chondromalacia patellae     Lower back pain     Family history of diabetes mellitus (DM)     Intermittent spinal claudication (HCC)     Vitamin D deficiency     DDD (degenerative disc disease), lumbar     Bilateral chronic knee pain     SVT To 240 bpm (diagnosed March 2010); on Toprol XL; recurrent symptoms relieved w/ vagal maneuvers     BMI 35.0-35.9,adult     Macular chorioretinal scar of left eye     Hyperopia of both eyes with astigmatism and presbyopia     Osteoarthritis of both knees     Trigger middle finger of right hand     Primary osteoarthritis of first carpometacarpal joint of left hand     Chronic right shoulder pain     History of kidney stones     Age-related nuclear cataract  of both eyes     Exertional angina (HCC) (anomalous RCA)     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    Physical Exam    LMP 10/17/2006     Constitutional: Alert and oriented, appears well, no acute distress    Assessment & Plan    Diagnoses and all orders for this visit:  BMI 35.0-35.9,adult    Serena Colonel, CNP

## 2023-07-20 NOTE — Progress Notes (Unsigned)
 NUTRITION RE-ASSESSMENT / INTERVENTION    Total Time: {Minutes Spent Nutrition Visit:20119}    Home Clinic:  [address] [facility] [place] / [place]  [phone]    Deborah [name] is a 58 year old female who presents with desire for weight loss, currently on Trulicity with pmh of GERD, vitamin D deficiency, and kidney stones.  Visit via [place] interpreter, [phone], via phone.  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  Patient lives with her parents and son (23 yrs).    -Begin to eat breakfast daily that includes protein food sources  -Eat protein first at dinner, vegetables second, starches last  -Eat slowly at all meals   -Add [place] yogurt or cottage cheese to increase calcium intake      Subjective:  -Has been taking Trulicity for >3 months - had some nausea at first   -Has lost ~10 lbs (per pt report), has maintained the loss   -Dosage of Trulicity has been increased so she could continue to lose weight OR she may try another option if insurance covers it  -Has decreased food portions, but reports wt loss has stalled        Review of patient's family history indicates:     Problem: Hypertension      Relation: Mother          Age of Onset: (Not Specified)  Problem: Diabetes      Relation: Mother          Age of Onset: (Not Specified)  Problem: Heart      Relation: Father          Age of Onset: (Not Specified)          Comment: arrhythmia  Problem: Hypertension      Relation: Father          Age of Onset: (Not Specified)  Problem: Heart      Relation: Maternal Grandmother          Age of Onset: (Not Specified)  Problem: Diabetes      Relation: Maternal Uncle          Age of Onset: (Not Specified)  Problem: Diabetes      Relation: Maternal Uncle          Age of Onset: (Not Specified)  Problem: Glaucoma      Relation: Maternal Uncle          Age of Onset: (Not Specified)  Problem: Heart      Relation: Maternal Uncle          Age of Onset: (Not Specified)  Problem: Heart       Relation: Maternal Uncle          Age of Onset: (Not Specified)  Problem: Cancer - Ovarian      Relation: Paternal Aunt          Age of Onset: 79  Problem: Cancer - Other      Relation: FamHxNeg          Age of Onset: (Not Specified)  Problem: Cancer - Breast      Relation: FamHxNeg          Age of Onset: (Not Specified)  Problem: Cancer - Colon      Relation: FamHxNeg          Age of Onset: (Not Specified)  Problem: Cancer - Lung      Relation: FamHxNeg          Age  of Onset: (Not Specified)              Current Outpatient Medications   Medication Sig    dulaglutide (TRULICITY) 4.5 MG/0.5ML sc auto-injector Inject 4.5 mg under the skin once a week    cholecalciferol (VITAMIN D3) 2000 UNIT tablet Take 1 tablet by mouth daily    diclofenac (VOLTAREN) 1 % GEL Gel Apply 4 g topically in the morning and 4 g at noon and 4 g in the evening and 4 g before bedtime.    omeprazole (PRILOSEC) 20 MG capsule Take 1 capsule by mouth daily    DULoxetine (CYMBALTA) 60 MG capsule Take 1 capsule by mouth daily    metoprolol (TOPROL-XL) 100 MG 24 hr tablet Take 1 tablet by mouth daily with dinner         No current facility-administered medications for this visit.            Vitamins/Minerals/Herbal Supplements: vitamin D3 2000 IU, prescribed      Social History    Tobacco Use      Smoking status: Former        Packs/day: 0.00        Years: 0.5 packs/day for 27.0 years (13.5 ttl pk-yrs)        Types: Cigarettes        Start date: 06/28/1975        Quit date: 06/28/2002        Years since quitting: 20.9      Smokeless tobacco: Never    Alcohol use: No      Alcohol/week: 0.0 standard drinks of alcohol          Review of Patient's Allergies indicates:      Naproxen                Swelling    Comment:Ed visit with angioedema   Ibuprofen               Other (See Comments)    Comment:Epigastric pain, throat swelling?             05/06/12 pt states uses sometimes without issue   Morphine                Other (See Comments)    Comment:"THE LAST  TIME I WAS HERE, THEY GAVE IT TO ME AND             TOLD ME TO TELL THE DOCTOR NOT TO GIVE IT TO ME             ANYMORE. IT MADE ME ACT DIFFERENT".   Pollen extract-tree*    Runny Nose    Comment:HA, itchy watery eyes        Labs reviewed: Yes           Lab Results   Component Value Date     HGB 13.1 08/14/2022            Lab Results   Component Value Date     HCT 40.8 08/14/2022            Lab Results   Component Value Date     HGBA1C 5.5 08/14/2022               Lab Results   Component Value Date     CHOLESTEROL 216 04/29/2021            Lab Results   Component Value Date     LDL 144 04/29/2021  Lab Results   Component Value Date     HDL 55 04/29/2021            Lab Results   Component Value Date     TG 124 04/29/2021               Lab Results   Component Value Date     GLUCOSER 91 08/14/2022         Vitals:  Most Recent BP Reading(s)  06/11/23 : 118/82        Most Recent Height Reading(s)  06/11/23 : 5' 1.02" (1.55 m)         Most Recent Weight Reading(s)  06/11/23 : 81.6 kg (180 lb)  05/07/23 : 81.2 kg (179 lb)  04/17/23 : 82.6 kg (182 lb)  01/29/23 : 84.4 kg (186 lb)  12/25/22 : 83.5 kg (184 lb)     Weight change: no change since last clinic weight        Estimated body mass index is 33.98 kg/m as calculated from the following:       Height as of 06/11/23: 5' 1.02" (1.55 m).    Weight as of 06/11/23: 81.6 kg (180 lb).        Physical Activity:  Type: job is very active, does not do other exercise outside of work      Activities of Daily Living: Independent     Chewing ability: no concerns   Swallowing ability: no concerns   Appetite: typically feels very hungry, especially when she has anxiety     Food Allergies: No known allergies  Food Dislikes: not really      Food purchased by: mother and patient - Biochemist, clinical, Best boy, Geographical information systems officer for fruits   Food prepared by: mostly pt's mother   Meal sources: mainly home cooked  Stress: some increased stress   Sleep: sleeps about ~6-7 hrs on weekdays, doesn't feel  tired   Readiness to learn: attentive      Diet Recall:  B: sometimes doesn't feel hungry, but others times takes toast/cheese or eggs  L: Brings ham sandwich w/lettuce OR pizza OR cranberries with nuts   D: Rice, beans, meat, salad   Might have fruit after dinner  Fluids: 3-4 bottles (48-64 oz) of water, sometimes has Pepsi      Weekends: typically skips breakfast, goes out to lunch with parents     Eating frequency shows some inconsistencies   Eating habits show some stress/anxiety eating      Estimated intake shows: sub-optimal nutrients: protein, calcium, and fiber     Assessment/Plan/Conclusion:  Discussed with patient the importance of consistent meals for weight loss, including weekends. Reviewed protein food sources and functions in the body, especially in relation to weight loss. Encouraged her to begin to add breakfast daily to enhance her metabolism and to aid in reducing hunger during the day. Discussed ideas she could bring to work ([place] yogurt, cottage cheese, eggs). Also reviewed slowing down at meals and focusing on eating protein and vegetables first at meals to assess hunger/fullness cues. All patient questions answered and addressed. Will reassess progress x 1 month.      Patient's goals:        Follow-up:      Amador Bad, MS, RDN

## 2023-07-23 ENCOUNTER — Ambulatory Visit

## 2023-07-23 DIAGNOSIS — E66811 Obesity, class 1: Secondary | ICD-10-CM | POA: Insufficient documentation

## 2023-07-30 ENCOUNTER — Ambulatory Visit (HOSPITAL_BASED_OUTPATIENT_CLINIC_OR_DEPARTMENT_OTHER)

## 2023-08-01 ENCOUNTER — Other Ambulatory Visit: Payer: Self-pay

## 2023-08-01 ENCOUNTER — Ambulatory Visit: Attending: Internal Medicine

## 2023-08-01 VITALS — BP 118/80 | HR 95 | Temp 98.4°F | Ht 61.0 in | Wt 175.0 lb

## 2023-08-01 DIAGNOSIS — M170 Bilateral primary osteoarthritis of knee: Secondary | ICD-10-CM | POA: Insufficient documentation

## 2023-08-01 DIAGNOSIS — E669 Obesity, unspecified: Secondary | ICD-10-CM | POA: Diagnosis present

## 2023-08-01 DIAGNOSIS — I2089 Other forms of angina pectoris: Secondary | ICD-10-CM | POA: Diagnosis present

## 2023-08-01 DIAGNOSIS — Z6835 Body mass index (BMI) 35.0-35.9, adult: Secondary | ICD-10-CM | POA: Diagnosis present

## 2023-08-01 LAB — LIPID PANEL
Cholesterol: 196 mg/dL (ref 0–239)
HIGH DENSITY LIPOPROTEIN: 46 mg/dL (ref 40–60)
LOW DENSITY LIPOPROTEIN DIRECT: 124 mg/dL (ref 0–189)
TRIGLYCERIDES: 200 mg/dL — ABNORMAL HIGH (ref 0–150)

## 2023-08-01 MED ORDER — DICLOFENAC SODIUM 1 % EX GEL
4.0000 g | Freq: Four times a day (QID) | CUTANEOUS | 3 refills | Status: DC
Start: 2023-08-01 — End: 2024-02-13

## 2023-08-01 MED ORDER — DULAGLUTIDE 4.5 MG/0.5ML SC SOAJ
4.5000 mg | SUBCUTANEOUS | 2 refills | Status: AC
Start: 2023-08-01 — End: 2023-10-30

## 2023-08-01 NOTE — Progress Notes (Unsigned)
 HPI    The patient verbally consented to an audio recording of their visit to assist with the completion of documentation. The patient is aware the recording is not retained after the visit is summarized.     Deborah [name] is a 58 year old female here for f/u weight management after increasing dulaglutide  to 4.5 mg weekly. However, she didn't get the 4.5 mg dose because she missed an appointment and has had a month of missed doses. Weight is down 5 lbs by our scale despite this.    History of Present Illness          Current Outpatient Medications:     dulaglutide  (TRULICITY ) 4.5 MG/0.5ML sc auto-injector, Inject 4.5 mg under the skin once a week, Disp: 4 each, Rfl: 2    diclofenac  (VOLTAREN ) 1 % GEL Gel, Apply 4 g topically in the morning and 4 g at noon and 4 g in the evening and 4 g before bedtime., Disp: 100 g, Rfl: 3    cholecalciferol (VITAMIN D3) 2000 UNIT tablet, Take 1 tablet by mouth daily, Disp: 90 tablet, Rfl: 3    omeprazole  (PRILOSEC ) 20 MG capsule, Take 1 capsule by mouth daily, Disp: 90 capsule, Rfl: 3    DULoxetine  (CYMBALTA ) 60 MG capsule, Take 1 capsule by mouth daily, Disp: 90 capsule, Rfl: 3    metoprolol  (TOPROL -XL) 100 MG 24 hr tablet, Take 1 tablet by mouth daily with dinner, Disp: 90 tablet, Rfl: 3    Review of Patient's Allergies indicates:   Naproxen                 Swelling    Comment:Ed visit with angioedema   Ibuprofen                Other (See Comments)    Comment:Epigastric pain, throat swelling?             05/06/12 pt states uses sometimes without issue   Morphine                 Other (See Comments)    Comment:"THE LAST TIME I WAS HERE, THEY GAVE IT TO ME AND             TOLD ME TO TELL THE DOCTOR NOT TO GIVE IT TO ME             ANYMORE. IT MADE ME ACT DIFFERENT".   Pollen extract-tree*    Runny Nose    Comment:HA, itchy watery eyes    Past Medical History:  04/04/2006: Abdominal pain, epigastric      Comment:  Nml LFTs, Hep A/B/C serologies neg; RUQ U/S shows no                 hepatobiliary abnormalities (completely normal)  Seen by                GI 3/08 with evaluation pending  12/24/2020: Age-related nuclear cataract of both eyes      Comment:  Mild, early   06/30/2014: Bilateral arm pain  03/15/2015: Chronic right-sided low back pain with right-sided sciatica  02/12/2019: Covid Infection March 2021 -- Ill But Not Hosp'd.      Comment:  Risk category:   Low Tested? Positive Community managed:               Principal risk manager: Requires additional outreach: No                Risk factors: Obesity, latinX  Clinical  Course First date               of symptoms:    06/18/19 06/27/19: (DOI 9): CM Result call               - tested positive at outside location (3/15). Overall her               sx have greatly improved, main complaint is nasal                congestion. Previously had sore throat, fevers, and cough  02/22/2008: CTS (Carpal Tunnel Syndrome), b/l      Comment:  Seen by ortho 02/21/08, referred back to physiatry for                EMG  02/22/2008: De Quervain's Tenosynovitis, R      Comment:  Seen by ortho 02/21/08 referred to OT/splinting  No date: Esophageal reflux  10/07/2008: Greater Trochanteric Bursitis, b/l      Comment:  Seen by physiatry with L steroid injection  No date: Heart disease  05/18/2017: Hyperopia of both eyes with astigmatism and presbyopia  No date: Irregular menstrual cycle  06/06/2017: Lateral epicondylitis of right elbow  11/12/2019: Left ureteral stone  11/14/2006: Leg pain      Comment:  Intermittent, with fatigue, subj weakness; elevated ESR,               but nml CRP/TSH/Ferritin/ANA  Seen by physiatry 09/28/08                with MRI pending  02/25/2013: Lump of skin of lower extremity      Comment:  Dermatofibroma R foot on biopsy  11/10/2004: Lump or mass in breast  05/18/2017: Macular chorioretinal scar of left eye      Comment:  Temporal to fovea, left eye  07/27/2006: Plantar Fasciitis, L      Comment:  S/p cortisone injxn with podiatry 07/25/06  06/30/2014:  Positive occult stool blood test  No date: Pregnant state, incidental      Comment:  c/s breech  06/06/2017: Right shoulder injury  08/08/2007: Right subacromial bursitis/rotator cuff tendinopathy      Comment:  S/p steroid injection with physiatry 07/08/08  3/10: SVT (supraventricular tachycardia) (HCC)      Comment:  [facility]  08/08/2007: Triggering of Finger, R 3rd      Comment:  Seen by Dr. Anthonette Kinsman 02/21/08 s/p steroid injection; again               10/07/08    Patient Active Problem List:     Gastroesophageal reflux disease     Other specified gastritis     Allergy  to naproxen      Chondromalacia patellae     Lower back pain     Family history of diabetes mellitus (DM)     Intermittent spinal claudication (HCC)     Vitamin D  deficiency     DDD (degenerative disc disease), lumbar     Bilateral chronic knee pain     SVT To 240 bpm (diagnosed March 2010); on Toprol  XL; recurrent symptoms relieved w/ vagal maneuvers     BMI 35.0-35.9,adult     Macular chorioretinal scar of left eye     Hyperopia of both eyes with astigmatism and presbyopia     Osteoarthritis of both knees     Trigger middle finger of right hand     Primary osteoarthritis of first carpometacarpal joint of left hand  Chronic right shoulder pain     History of kidney stones     Age-related nuclear cataract of both eyes     Exertional angina (HCC) (anomalous RCA)     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    Physical Exam    BP 118/80   Pulse 95   Temp 98.4 F (36.9 C) (Temporal)   Ht 5\' 1"  (1.549 m)   Wt 79.4 kg (175 lb)   LMP 10/17/2006   SpO2 98%   BMI 33.07 kg/m     Most Recent Weight  Reading(s)  08/01/23 : 79.4 kg (175 lb)  06/11/23 : 81.6 kg (180 lb)  05/07/23 : 81.2 kg (179 lb)  04/17/23 : 82.6 kg (182 lb)  01/29/23 : 84.4 kg (186 lb)      Constitutional: Alert and oriented, appears well, no acute distress    Assessment & Plan    Diagnoses and all orders for this visit:  BMI 35.0-35.9,adult  -     dulaglutide  (TRULICITY ) 4.5 MG/0.5ML sc auto-injector; Inject 4.5 mg under the skin once a week  Exertional angina (HCC) (anomalous RCA)  -     LIPID PANEL  Osteoarthritis of both knees, unspecified osteoarthritis type  -     diclofenac  (VOLTAREN ) 1 % GEL Gel; Apply 4 g topically in the morning and 4 g at noon and 4 g in the evening and 4 g before bedtime.    Ruel Cotta, CNP

## 2023-08-02 ENCOUNTER — Ambulatory Visit (HOSPITAL_BASED_OUTPATIENT_CLINIC_OR_DEPARTMENT_OTHER): Payer: Self-pay

## 2023-08-02 ENCOUNTER — Encounter (HOSPITAL_BASED_OUTPATIENT_CLINIC_OR_DEPARTMENT_OTHER): Payer: Self-pay

## 2023-08-02 NOTE — Progress Notes (Signed)
 Dear MAs   Please:     1. Select the language ([place] appropriate Letter Template.       2. Insert the following comments:           "Seus resultados esto normais.     Signed, Serena Colonel, CNP"     3. Send the letter to the patient.    Thank you,  Serena Colonel, CNP

## 2023-08-15 ENCOUNTER — Other Ambulatory Visit (HOSPITAL_BASED_OUTPATIENT_CLINIC_OR_DEPARTMENT_OTHER): Payer: Self-pay

## 2023-08-15 DIAGNOSIS — Z1211 Encounter for screening for malignant neoplasm of colon: Secondary | ICD-10-CM

## 2023-09-21 NOTE — Progress Notes (Unsigned)
 NUTRITION RE-ASSESSMENT / INTERVENTION     Total Time: {Minutes Spent Nutrition Visit:20119}    Home Clinic:  [address] [facility] / [place]*  [phone]    Deborah [name] is a 58 year old female who is at nutrition visit for re-assessment for reassessment for weight management, with pmh of GERD, vitamin D  deficiency, and kidney stones.  Visit via [place] interpreter, [phone], via phone.  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  Patient lives with her parents and son (23 yrs).    -Continue with current nutrition changes  -Continue to focus on meal consistency on weekends     Subjective:  -Has been off Trulicity  for the past 3 weeks, will have follow up with PCP  -Has maintained her weight loss - 177 lb   -Reduced portion size of rice at meals             Current Outpatient Medications   Medication Sig    dulaglutide  (TRULICITY ) 4.5 MG/0.5ML sc auto-injector Inject 4.5 mg under the skin once a week    cholecalciferol (VITAMIN D3) 2000 UNIT tablet Take 1 tablet by mouth daily    diclofenac  (VOLTAREN ) 1 % GEL Gel Apply 4 g topically in the morning and 4 g at noon and 4 g in the evening and 4 g before bedtime.    omeprazole  (PRILOSEC ) 20 MG capsule Take 1 capsule by mouth daily    DULoxetine  (CYMBALTA ) 60 MG capsule Take 1 capsule by mouth daily    metoprolol  (TOPROL -XL) 100 MG 24 hr tablet Take 1 tablet by mouth daily with dinner         No current facility-administered medications for this visit.            Vitamins/Minerals/Herbal Supplements: vitamin D3 2000 IU, prescribed      Social History    Tobacco Use      Smoking status: Former        Packs/day: 0.00        Years: 0.5 packs/day for 27.0 years (13.5 ttl pk-yrs)        Types: Cigarettes        Start date: 06/28/1975        Quit date: 06/28/2002        Years since quitting: 20.9      Smokeless tobacco: Never    Alcohol use: No      Alcohol/week: 0.0 standard drinks of alcohol           Review of Patient's  Allergies indicates:       Naproxen                 Swelling    Comment:Ed visit with angioedema   Ibuprofen                Other (See Comments)    Comment:Epigastric pain, throat swelling?             05/06/12 pt states uses sometimes without issue   Morphine                 Other (See Comments)    Comment:THE LAST TIME I WAS HERE, THEY GAVE IT TO ME AND             TOLD ME TO TELL THE DOCTOR NOT TO GIVE IT TO ME             ANYMORE. IT MADE ME ACT DIFFERENT.   Pollen extract-tree*    Runny  Nose    Comment:HA, itchy watery eyes         Labs reviewed: Yes               Lab Results   Component Value Date     HGB 13.1 08/14/2022                Lab Results   Component Value Date     HCT 40.8 08/14/2022                Lab Results   Component Value Date     HGBA1C 5.5 08/14/2022                   Lab Results   Component Value Date     CHOLESTEROL 216 04/29/2021                Lab Results   Component Value Date     LDL 144 04/29/2021                Lab Results   Component Value Date     HDL 55 04/29/2021                Lab Results   Component Value Date     TG 124 04/29/2021                   Lab Results   Component Value Date     GLUCOSER 91 08/14/2022         Vitals:  Most Recent BP Reading(s)  06/11/23 : 118/82        Most Recent Height Reading(s)  06/11/23 : 5' 1.02 (1.55 m)         Most Recent Weight Reading(s)  06/11/23 : 81.6 kg (180 lb)  05/07/23 : 81.2 kg (179 lb)  04/17/23 : 82.6 kg (182 lb)  01/29/23 : 84.4 kg (186 lb)  12/25/22 : 83.5 kg (184 lb)     Weight change: 177 lb per pt scale (07/23/23)        Estimated body mass index is 33.98 kg/m as calculated from the following:        Height as of 06/11/23: 5' 1.02 (1.55 m).    Weight as of 06/11/23: 81.6 kg (180 lb).         Physical Activity:  Type: job is very active, does not do other exercise outside of work      Diet Recall: 07/23/23  B: 1-2 eggs or cottage cheese  L: Chicken with beans, salad, reduced portion of rice   D: Salad with protein, sometimes cheese  sandwich with egg  Fluids: tea, 4 bottles per day   Bowel movements have improved      Diet Recall: 06/18/2023  B: sometimes doesn't feel hungry, but others times takes toast/cheese or eggs  L: Brings ham sandwich w/lettuce OR pizza OR cranberries with nuts   D: Rice, beans, meat, salad   Might have fruit after dinner  Fluids: 3-4 bottles (48-64 oz) of water , sometimes has Pepsi      Weekends: aiming to improve meal consistency      Estimated intake shows: improved protein intake     Assessment/Plan/Conclusion:  Deborah [name] presents for reassessment for weight management. She reports that she has been off Trulicity  for the past three weeks and has maintained her weight loss thus far. She has an appt with her PCP next week to discuss next steps for weight loss  medication. She has successfully implemented nutrition changes, such as increasing her protein, water , and vegetable intake, as well as reducing portion sizes for rice. She is also working on meal consistency on weekends. Will plan to follow up x 2 months to assess further progress, especially if she is restarted on Trulicity  or begins an injection medication.     Patient's goals:        Follow-up:      Amador Bad, MS, RDN

## 2023-09-24 ENCOUNTER — Ambulatory Visit

## 2023-09-24 DIAGNOSIS — E66811 Obesity, class 1: Secondary | ICD-10-CM | POA: Insufficient documentation

## 2023-10-29 ENCOUNTER — Ambulatory Visit

## 2023-10-30 ENCOUNTER — Other Ambulatory Visit: Payer: Self-pay

## 2023-10-30 ENCOUNTER — Ambulatory Visit
Payer: No Typology Code available for payment source | Attending: Clinical Cardiac Electrophysiology | Admitting: Clinical Cardiac Electrophysiology

## 2023-10-30 VITALS — BP 102/68 | HR 81 | Temp 97.0°F | Wt 177.0 lb

## 2023-10-30 DIAGNOSIS — I2089 Other forms of angina pectoris: Secondary | ICD-10-CM | POA: Diagnosis present

## 2023-10-30 DIAGNOSIS — Q245 Malformation of coronary vessels: Secondary | ICD-10-CM | POA: Insufficient documentation

## 2023-10-30 DIAGNOSIS — Z6833 Body mass index (BMI) 33.0-33.9, adult: Secondary | ICD-10-CM | POA: Diagnosis present

## 2023-10-30 DIAGNOSIS — I471 Supraventricular tachycardia, unspecified: Secondary | ICD-10-CM | POA: Insufficient documentation

## 2023-10-30 MED ORDER — METOPROLOL SUCCINATE ER 100 MG PO TB24
100.0000 mg | ORAL_TABLET | Freq: Every day | ORAL | 3 refills | Status: AC
Start: 2023-10-30 — End: 2024-10-30

## 2023-10-30 NOTE — Progress Notes (Signed)
 OUTPATIENT CARDIOLOGY PROGRESS NOTE  Date of visit: 10/30/2023    Patient returns for follow-up in cardiology clinic.      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    She feels well. Has been losing weight - Lost 13 lbs since May 2024.    She denies any recurrent palpitations. No further episodes of chest/throat tightness for >2 yrs. No SOB. No syncope or presyncope. No orthopnea or PND. No edema.    Some chronic intermittent off balance rarely related to positional vertigo after abrupt positions changes such as after bending or turning head.     She still remains very reluctant to undergo cardiac surgery for anomalous coronary artery as recommended by CT Surgery at [facility] (Dr. [place] Bloom) - Risk of SCD again reviewed with patient today which could occur without warning in the absence of any other symptoms.    ROS: Intermittent sudden onset B eye pain with some tearing; I contacted Dr. Nevada to facilitate appt.    Current Medications:  diclofenac  (VOLTAREN ) 1 % GEL Gel, Apply 4 g topically in the morning and 4 g at noon and 4 g in the evening and 4 g before bedtime., Disp: 100 g, Rfl: 3  cholecalciferol (VITAMIN D3) 2000 UNIT tablet, Take 1 tablet by mouth daily, Disp: 90 tablet, Rfl: 3  omeprazole  (PRILOSEC ) 20 MG capsule, Take 1 capsule by mouth daily, Disp: 90 capsule, Rfl: 3  DULoxetine  (CYMBALTA ) 60 MG capsule, Take 1 capsule by mouth daily, Disp: 90 capsule, Rfl: 3    No current facility-administered medications on file prior to visit.      Review of Patient's Allergies indicates:   Naproxen                  Swelling    Comment:Ed visit with angioedema   Ibuprofen                Other (See Comments)    Comment:Epigastric pain, throat swelling?             05/06/12 pt states uses sometimes without issue   Morphine                 Other (See Comments)    Comment:THE LAST TIME I WAS HERE, THEY GAVE IT TO ME AND             TOLD ME TO TELL THE DOCTOR NOT TO GIVE IT TO ME             ANYMORE. IT MADE ME ACT DIFFERENT.   Pollen extract-tree*    Runny Nose    Comment:HA, itchy watery eyes    FAMILY HISTORY: Father has AFib. Mother is diabetes and has HTN.     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 Lives with adult son, father and mother. No cigs, EtOH, or drugs.     ROS: All other systems were pertinently reviewed and were negative unless otherwise outlined above.    PHYSICAL EXAM:  General: Obese; normal appearance for age; no apparent distress   10/30/23  1515   BP: 102/68   Site: Right Arm   Position: Sitting   Cuff Size: Regular   Pulse: 81   Temp: 97 F (36.1 C)   TempSrc: Temporal   SpO2: 97%   Weight: 80.3 kg (177 lb)     HEENT: Oral mucosa is moist, no icterus, no pallor noted.  NECK: Supple. No thyromegaly.  CHEST: Clear to auscultation, no crackles or rhonchi. No wheezes.  HEART: Normal JVP. Carotid pulse are +2 bilaterally with normal upstrokes and no bruits. Regular rate and rhythm. No murmurs, rubs, or gallops.   ABDOMEN: Soft, no organomegaly detected. No abdominal bruits.  EXTREMITIES: Warm and well perfused. Peripheral pulses are +2 bilaterally in both upper and lower extremities. There is no evidence of peripheral edema.    PSYCHIATRIC: Mood and affect are appropriate.  SKIN: No rash observed.    PERTINENT INVESTIGATIONS:  1. EKG today (on my personal review): Sinus rhythm with 83 bpm. Normal intervals. Normal QRS axis. Normal tracing. Compared to ECG from 04/17/23, no significant change.  2. LABS:   Lab Results   Component Value Date    NA 141 08/14/2022    K  4.5 08/14/2022    CL 103 08/14/2022    CO2 26 08/14/2022    BUN 16 08/14/2022    CREAT 0.8 08/14/2022    GLUCOSER 91 08/14/2022     Lab Results   Component Value Date    LDL 124 08/01/2023    HDL 46 08/01/2023    TG 200 (H) 08/01/2023     ECHO 02/22/2016:  1. LV ejection fraction is 60%.     2. There are no regional wall motion abnormalities.     3. Normal study.     4. Compared to the previous study dated 04/18/11, there is no significant change.     30-Day Event Monitor Aug/Sept 2021:  Baseline rhythm at sinus at 76 bpm. There were no symptomatic recordings. There were 8 auto-triggered recordings which showed sinus at 47-138 bpm with no ectopy.     Stress Echo 09/03/2020:  1. Bruce Protocol to 5 minutes and 7 METS, achieving 141 BPM = 85% max predicted. Good double product of 24. Exercise capacity was 95% of normal.   2. 5/10 non-pleuritic throat pressure typical of that of chief complaint developed during exercise and subsided over 5 minutes of recovery.   3. No ST changes developed.   4. LV function was normal by echo at rest and with exertion, with no regional wall motion abnormalities.   5. No evidence of inducible ischemia.     Coronary CTA 05/04/2021 [facility]):  Anomalous RCA which originates from the Left Main coronary artery with an interarterial course. No CAD.     14-Day Zio Patch 02/03/22-02/16/22:  The predominant rhythm was Sinus Rhythm with average HR 79 bpm.  There were no patient triggered recordings. There were no diary entries.  There was 1 auto-triggered (asymptomatic) run of supraventricular tachycardia (SVT) lasting 18 beats with a max rate of 174 bpm (avg 165 bpm).   There were 4 isolated PVCs.  There were 75 isolated PACs, 3 atrial couplets, and no atrial triplets.       ASSESSMENT:  58 year old woman with preserved LVEF and normal baseline ECG, h/o SVT diagnosed in March 2010 (suppressed on Toprol  XL) who was diagnosed with an anomalous RCA on coronary CTA Jan 2023 (originating from LMCA w/  interarterial course) after she developed increased DOE/exertional throat tightness since  March 2022. She was evaluated by Dr. Kip Milder from Cardiology at [facility] and by Dr. Robinson by CT surgery at [facility] in May 2023 and surgical correction of her RCA was recommended. While she understands the risk of sudden death with her condition, she is afraid of having surgery and continues to decline surgery.    Clinically, she is doing well - No recurrent symptoms of sig DOE/throat tightness or recent palpitations.    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 She demonstrates good understanding and all questions answered. She is declines surgery.  (Z68.33) BMI 33.0-33.9,adult  The 10-year ASCVD risk score (Arnett DK, et al., 2019) is: 2.1%    Values used to calculate the score:      Age: 29 years      Sex: Female      Is Non-Hispanic African American: No      Diabetic: No      Tobacco smoker: No      Systolic Blood Pressure: 102 mmHg      Is BP treated: No      HDL Cholesterol: 46 mg/dL      Total Cholesterol: 196 mg/dL  Plan:   1. She knows to call 911 for any progressive symptoms of angina, SOB, syncope, or presyncope.  2. Continue efforts in weight loss.  3. Continue to encourage consideration of CT surgery.    (I47.1) SVT To 240 bpm (diagnosed March 2010); on Toprol  XL; recurrent symptoms relieved w/ vagal maneuvers  Comment: Suspect AVNRT although I cannot retrieve her SVT ECG from March 2010.  Plan:   1. Continue Toprol  XL 100 mg qPM.  2. If symptoms progress, will consider SVT ablation.      Return for follow up in Cardiology Clinic in 6 months. To contact earlier if there are any cardiac related issues.  I spent a  total of 35 minutes on this visit on the date of service (total time includes all activities performed on the date of service).    This Cardiology Division patient encounter note was created using voice-recognition software and in real time during the clinic visit. Please excuse any typographical errors that have not been edited out.     Electronically signed by: Arnetta SHAUNNA Lobo, MD, 11/04/2023 12:23 AM

## 2023-11-01 LAB — EKG

## 2023-11-04 ENCOUNTER — Encounter (HOSPITAL_BASED_OUTPATIENT_CLINIC_OR_DEPARTMENT_OTHER): Payer: Self-pay | Admitting: Clinical Cardiac Electrophysiology

## 2023-11-19 ENCOUNTER — Other Ambulatory Visit: Payer: Self-pay

## 2023-11-19 ENCOUNTER — Ambulatory Visit

## 2023-11-19 VITALS — BP 102/69 | HR 80 | Temp 96.8°F | Ht 60.98 in | Wt 180.0 lb

## 2023-11-19 DIAGNOSIS — Z6835 Body mass index (BMI) 35.0-35.9, adult: Secondary | ICD-10-CM | POA: Insufficient documentation

## 2023-11-19 DIAGNOSIS — E669 Obesity, unspecified: Secondary | ICD-10-CM

## 2023-11-19 DIAGNOSIS — H5713 Ocular pain, bilateral: Secondary | ICD-10-CM

## 2023-11-19 LAB — HEMOGLOBIN A1C
ESTIMATED AVERAGE GLUCOSE: 108 mg/dL (ref 74–160)
HEMOGLOBIN A1C: 5.4 % (ref 4.0–5.6)

## 2023-11-19 NOTE — Progress Notes (Signed)
 HPI    The patient verbally consented to an audio recording of their visit to assist with the completion of documentation. The patient is aware the recording is not retained after the visit is summarized.     Deborah [name] is a 58 year old female     Last seen 4/25, was on dulaglutide  4.5 mg at that time. She has been using Trulicity  for weight loss with positive results but has not taken it for the past two weeks due to travel. She resumed it this morning and has three doses left. Her weight was previously 75 kg, but she has gained a few pounds recently. Unfortunately, [facility] will no longer be paying for Trulicity  or any other weight loss medication. H/o SVT and exertional angina 2/2 anomalous RCA, so not a candidate for phentermine.     Snores a lot, raising question of OSA. No witnessed apnea, but has slept alone for decades. ESS is 8.     C/o sudden pains in both eyes, deep in the eye ball itself, intermittent x 8 mos. Fleeting but severe. Coming more frequently now, up to 3 x daily. Has to put counterpressure on the globes to suppress the pain. Denies vision changes; wears corrective glasses. 1 year wait to see her ophthalmologist Dr. Nevada.       Current Outpatient Medications:     TRULICITY  4.5 MG/0.5ML sc auto-injector, INJECT 4.5 MG UNDER THE SKIN ONCE A WEEK, Disp: 2 mL, Rfl: 2    metoprolol  (TOPROL -XL) 100 MG 24 hr tablet, Take 1 tablet by mouth daily with dinner, Disp: 90 tablet, Rfl: 3    diclofenac  (VOLTAREN ) 1 % GEL Gel, Apply 4 g topically in the morning and 4 g at noon and 4 g in the evening and 4 g before bedtime., Disp: 100 g, Rfl: 3    cholecalciferol (VITAMIN D3) 2000 UNIT tablet, Take 1 tablet by mouth daily, Disp: 90 tablet, Rfl: 3    omeprazole  (PRILOSEC ) 20 MG capsule, Take 1 capsule by mouth daily, Disp: 90 capsule, Rfl: 3    DULoxetine  (CYMBALTA ) 60 MG capsule, Take 1 capsule by mouth daily, Disp: 90 capsule, Rfl: 3    Review of Patient's Allergies indicates:   Naproxen                  Swelling    Comment:Ed visit with angioedema   Ibuprofen                Other (See Comments)    Comment:Epigastric pain, throat swelling?             05/06/12 pt states uses sometimes without issue   Morphine                 Other (See Comments)    Comment:THE LAST TIME I WAS HERE, THEY GAVE IT TO ME AND             TOLD ME TO TELL THE DOCTOR NOT TO GIVE IT TO ME             ANYMORE. IT MADE ME ACT DIFFERENT.   Pollen extract-tree*    Runny Nose    Comment:HA, itchy watery eyes    Past Medical History:  04/04/2006: Abdominal pain, epigastric      Comment:  Nml LFTs, Hep A/B/C serologies neg; RUQ U/S shows no                hepatobiliary abnormalities (completely normal)  Seen by  GI 3/08 with evaluation pending  12/24/2020: Age-related nuclear cataract of both eyes      Comment:  Mild, early   06/30/2014: Bilateral arm pain  03/15/2015: Chronic right-sided low back pain with right-sided sciatica  02/12/2019: Covid Infection March 2021 -- Ill But Not Hosp'd.      Comment:  Risk category:   Low Tested? Positive Community managed:               Principal risk manager: Requires additional outreach: No                Risk factors: Obesity, latinX  Clinical Course First date               of symptoms:    06/18/19 06/27/19: (DOI 9): CM Result call               - tested positive at outside location (3/15). Overall her               sx have greatly improved, main complaint is nasal                congestion. Previously had sore throat, fevers, and cough  02/22/2008: CTS (Carpal Tunnel Syndrome), b/l      Comment:  Seen by ortho 02/21/08, referred back to physiatry for                EMG  02/22/2008: De Quervain's Tenosynovitis, R      Comment:  Seen by ortho 02/21/08 referred to OT/splinting  No date: Esophageal reflux  10/07/2008: Greater Trochanteric Bursitis, b/l      Comment:  Seen by physiatry with L steroid injection  No date: Heart disease  05/18/2017: Hyperopia of both eyes with astigmatism and  presbyopia  No date: Irregular menstrual cycle  06/06/2017: Lateral epicondylitis of right elbow  11/12/2019: Left ureteral stone  11/14/2006: Leg pain      Comment:  Intermittent, with fatigue, subj weakness; elevated ESR,               but nml CRP/TSH/Ferritin/ANA  Seen by physiatry 09/28/08                with MRI pending  02/25/2013: Lump of skin of lower extremity      Comment:  Dermatofibroma R foot on biopsy  11/10/2004: Lump or mass in breast  05/18/2017: Macular chorioretinal scar of left eye      Comment:  Temporal to fovea, left eye  07/27/2006: Plantar Fasciitis, L      Comment:  S/p cortisone injxn with podiatry 07/25/06  06/30/2014: Positive occult stool blood test  No date: Pregnant state, incidental      Comment:  c/s breech  06/06/2017: Right shoulder injury  08/08/2007: Right subacromial bursitis/rotator cuff tendinopathy      Comment:  S/p steroid injection with physiatry 07/08/08  3/10: SVT (supraventricular tachycardia) (HCC)      Comment:  [facility]  08/08/2007: Triggering of Finger, R 3rd      Comment:  Seen by Dr. Belma 02/21/08 s/p steroid injection; again               10/07/08    Patient Active Problem List:     Gastroesophageal reflux disease     Other specified gastritis     Allergy  to naproxen      Chondromalacia patellae     Lower back pain     Family history of diabetes mellitus (DM)     Intermittent  spinal claudication (HCC)     Vitamin D  deficiency     DDD (degenerative disc disease), lumbar     Bilateral chronic knee pain     SVT To 240 bpm (diagnosed March 2010); on Toprol  XL; recurrent symptoms relieved w/ vagal maneuvers     BMI 35.0-35.9,adult     Macular chorioretinal scar of left eye     Hyperopia of both eyes with astigmatism and presbyopia     Osteoarthritis of both knees     Trigger middle finger of right hand     Primary osteoarthritis of first carpometacarpal joint of left hand     Chronic right shoulder pain     History of kidney stones     Age-related nuclear cataract of both eyes      Exertional angina (HCC) (anomalous RCA)     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    Physical Exam    BP 102/69   Pulse 80   Temp 96.8 F (36 C) (Temporal)   Ht 5' 0.98 (1.549 m)   Wt 81.6 kg (180 lb)   LMP 10/17/2006   SpO2 97%   BMI 34.03 kg/m     Most Recent Weight Reading(s)  11/19/23 : 81.6 kg (180 lb)  10/30/23 : 80.3 kg (177 lb)  08/01/23 : 79.4 kg (175 lb)  06/11/23 : 81.6 kg (180 lb)  05/07/23 : 81.2 kg (179 lb)      Constitutional: Alert and oriented, appears well, no acute distress  HEENT: PERRL, EOMI, sclera nonicteric, conjunctiva noninjected, pressure grossly normal to light pressure to both eyes    Assessment & Plan  Obesity  Obesity management with Trulicity  was effective, but Insurance no longer covers Trulicity  for weight loss. Weight gain noted. Alternative medications needed considering cardiac history.  - Refer to endocrinologist for obesity management and cardiac-safe medication.  - Check blood sugar levels for potential GLP1 coverage if in diabetic range.    Daytime sleepiness  Daytime sleepiness noted, especially post-noon, with snoring but no witnessed apneas. Sleep study needed to evaluate for sleep apnea, treatment of which may aid weight management and vice versa.  - Order home sleep study for sleep apnea evaluation.  - Schedule sleep study through [facility].    Diagnoses and all orders for this visit:  BMI 35.0-35.9,adult  -     REFERRAL TO ENDOCRINOLOGY (INT)  -     HEMOGLOBIN A1C  -     SLEEP STUDY; Future  Eye pain, bilateral  Comments:  Advised her to go to [facility] ED for bilateral eye pain. Urgent concern for  glaucoma or other serious eye disease. She showed excellent understanding.          Total time was 35 minutes. That includes chart review before the visit, the actual patient visit, and time spent on documentation after the visit     Lauraine Castle, CNP

## 2023-11-20 ENCOUNTER — Telehealth (HOSPITAL_BASED_OUTPATIENT_CLINIC_OR_DEPARTMENT_OTHER): Payer: Self-pay

## 2023-11-20 ENCOUNTER — Ambulatory Visit (HOSPITAL_BASED_OUTPATIENT_CLINIC_OR_DEPARTMENT_OTHER): Payer: Self-pay

## 2023-11-20 DIAGNOSIS — H5713 Ocular pain, bilateral: Secondary | ICD-10-CM

## 2023-11-20 NOTE — Telephone Encounter (Signed)
 Patient called asking for referral for eye appointment and call back . Please review and call patient     Thank you

## 2023-11-21 ENCOUNTER — Other Ambulatory Visit (HOSPITAL_BASED_OUTPATIENT_CLINIC_OR_DEPARTMENT_OTHER): Payer: Self-pay

## 2023-11-21 ENCOUNTER — Encounter (HOSPITAL_BASED_OUTPATIENT_CLINIC_OR_DEPARTMENT_OTHER): Payer: Self-pay

## 2023-11-21 NOTE — Telephone Encounter (Signed)
 Deborah Domino, Deborah [name] to Me  (Selected Message)  SS      11/21/23  7:30 AM  We discussed going to [facility] after checking with her insurance about the cost of an ED visit to [facility]. If it will be too expensive for her to go there, I will put in an urgent referral to Dr. Loris office, but I have no power to help her get in sooner. Either way, she doesn't need a referral per se.    Thank you,  Deborah [name] to Deborah Domino, Deborah [name]   SS      11/20/23  1:53 PM  Did you discuss eye pain?  What was the plan to try to get into Dr. Patalano?  [facility]?  Thanks  Devere

## 2023-11-21 NOTE — Telephone Encounter (Signed)
 Spoke with patient, declined  Went to [facility] and they did check for glasses but did not address the pain  Worried to go to [facility] because they told her they do not take her insurance   Both eyes with pain   Sporadic 'epidsodes', was 1-2 week but now happening everyday, yesterday x3  3 minutes at a time  No pattern, not sure what is triggering  Would like a referral to [facility]  If pain worsens or vision changes, must to to [facility] ED, reluctant but agrees  RN to notify provider

## 2023-11-21 NOTE — Telephone Encounter (Signed)
 Good morning, I put in an urgent referral for this patient to see Dr. Nevada, please give her [facility] number to call for scheduling. Thanks, Lauraine

## 2023-11-21 NOTE — Telephone Encounter (Signed)
 PER who is calling: Pharmacy, Naimah Yingst is a 58 year old female has requested a refill of       trulicity .        Last Office Visit  : 11/19/2023 Lannie Domino, CNP      Last Tele Visit:  09/24/2023      Last Physical Exam:  08/14/2022    There are no preventive care reminders to display for this patient.    Other Med Adult:  Most Recent BP Reading(s)  11/19/23 : 102/69        Cholesterol (mg/dL)   Date Value   95/76/7974 196     LOW DENSITY LIPOPROTEIN DIRECT (mg/dL)   Date Value   95/76/7974 124     HIGH DENSITY LIPOPROTEIN (mg/dL)   Date Value   95/76/7974 46     TRIGLYCERIDES (mg/dL)   Date Value   95/76/7974 200 (H)         THYROID  SCREEN TSH REFLEX FT4 (uIU/mL)   Date Value   08/14/2022 2.280         TSH (THYROID  STIM HORMONE) (uIU/mL)   Date Value   12/22/2010 1.31       HEMOGLOBIN A1C (%)   Date Value   11/19/2023 5.4       No results found for: POCA1C      INR (no units)   Date Value   06/15/2008 1.0 (L)       SODIUM (mmol/L)   Date Value   08/14/2022 141       POTASSIUM (mmol/L)   Date Value   08/14/2022 4.5           CREATININE (mg/dL)   Date Value   94/93/7975 0.8       Documented patient preferred pharmacies:    [facility] [place], [place] - [address]  Phone: [phone] Fax: [phone]

## 2023-11-23 ENCOUNTER — Encounter (HOSPITAL_BASED_OUTPATIENT_CLINIC_OR_DEPARTMENT_OTHER): Payer: Self-pay

## 2023-11-26 ENCOUNTER — Telehealth (HOSPITAL_BASED_OUTPATIENT_CLINIC_OR_DEPARTMENT_OTHER): Payer: Self-pay

## 2023-11-26 NOTE — Telephone Encounter (Signed)
 [facility]  Call made to Hadassah Lela Cary to inform patient Urgent Referral has been placed for Dr. [place] office . No interpreter needed.   WHO ARE YOU CALLING: Patient (self) answered at [phone] ; informed patient I sent Mychart message [id number] Referral request has been placed ready for booking .  I have completed the following communication and reminder steps: Patient notified by telephone  [name], 11/26/2023    On behalf of PCP: Lannie Domino, CNP

## 2023-11-26 NOTE — Telephone Encounter (Addendum)
 [facility]  Call made to Deborah [name] to schedule an appointment with Ophthalmology for urgent eye pain. Provider asked for FD assistance's . No interpreter needed.   WHO ARE YOU CALLING: Patient (self) did not answer at [phone] ; this team member left a message asking for a call back.  I have completed the following communication and reminder steps: Future InBasket message to care team members with follow up due date  Patient notified by telephone  [name], 11/26/2023    On behalf of PCP: Lannie Domino, CNP     [facility]  Call made to Deborah [name] to schedule an appointment with Ophthalmology for urgent eye pain. Provider asked for FD assistance's . No interpreter needed.   WHO ARE YOU CALLING: Patient (self) answered at [phone] informed patient Ophthalmologist trying to reach her; transferred her to [facility] to book appt.  I have completed the following communication and reminder steps: Patient notified by telephone  [name], 12/17/2023    On behalf of PCP: Lannie Domino, CNP

## 2023-11-30 ENCOUNTER — Telehealth (HOSPITAL_BASED_OUTPATIENT_CLINIC_OR_DEPARTMENT_OTHER): Payer: Self-pay

## 2023-11-30 NOTE — Telephone Encounter (Signed)
 Spoke with patient has burning and pain both eyes off and on last about 2 mins each time. Advised to try OTC artifical tears 4x a day. Told patient we will schedule an eye exam for her next available. If the Artifical tears dont help patient will call back.

## 2023-12-03 ENCOUNTER — Ambulatory Visit (HOSPITAL_BASED_OUTPATIENT_CLINIC_OR_DEPARTMENT_OTHER)

## 2023-12-25 ENCOUNTER — Other Ambulatory Visit: Payer: Self-pay

## 2023-12-25 ENCOUNTER — Ambulatory Visit: Attending: Ophthalmology | Admitting: Ophthalmology

## 2023-12-25 ENCOUNTER — Encounter (HOSPITAL_BASED_OUTPATIENT_CLINIC_OR_DEPARTMENT_OTHER): Payer: Self-pay | Admitting: Ophthalmology

## 2023-12-25 DIAGNOSIS — H52203 Unspecified astigmatism, bilateral: Secondary | ICD-10-CM | POA: Insufficient documentation

## 2023-12-25 DIAGNOSIS — H2513 Age-related nuclear cataract, bilateral: Secondary | ICD-10-CM | POA: Diagnosis not present

## 2023-12-25 DIAGNOSIS — H524 Presbyopia: Secondary | ICD-10-CM | POA: Insufficient documentation

## 2023-12-25 DIAGNOSIS — H31012 Macula scars of posterior pole (postinflammatory) (post-traumatic), left eye: Secondary | ICD-10-CM | POA: Insufficient documentation

## 2023-12-25 DIAGNOSIS — H04129 Dry eye syndrome of unspecified lacrimal gland: Secondary | ICD-10-CM | POA: Diagnosis not present

## 2023-12-25 DIAGNOSIS — H5203 Hypermetropia, bilateral: Secondary | ICD-10-CM | POA: Insufficient documentation

## 2023-12-25 MED ORDER — CARBOXYMETHYLCELLULOSE SODIUM 0.5 % OP SOLN: 1.0000 [drp] | Freq: Four times a day (QID) | OPHTHALMIC | 6 refills | Status: DC

## 2023-12-25 NOTE — Progress Notes (Signed)
 Patient presents with:  Burning Eyes: Both eyes, are burning, irritated.   She has dysfunctional tear syndrome (also known as dry eye syndrome).  She will use artificial tears in both eyes, 6 times a day.    Other: She has history of macular scarring, left eye due to remote uveitis, stable now, no change in her vision, for comprehensive eye exam. No active process at this time. Follow.    Blurry VA: Her vision is a little blurry, both eyes. Previously noted to have cataracts. Vision is not worse than previously.  Cataracts, early. No specific therapy at this point.    Ar, refract and dilate again next visit.

## 2023-12-25 NOTE — Progress Notes (Signed)
 Pt here for urgent care     Pt c/o that ou's have been burning intermittently x 6 months and seem to be getter worse  Pt  has been using AT's bid ou x 7-10 days and they help ou's slightly  Denies changes in va ou  Denies flashes or floaters ou    Ocular HX- Macular chorioretinal scar OS  Hyperopia,astigmatism and presbyopia  Cataracts    LEE-12-24-20

## 2023-12-28 ENCOUNTER — Telehealth (HOSPITAL_BASED_OUTPATIENT_CLINIC_OR_DEPARTMENT_OTHER): Payer: Self-pay | Admitting: Endocrinology

## 2023-12-28 NOTE — Telephone Encounter (Addendum)
 LVM that pt appt w/ Dr Raechel 11/28 EMS is rescheduled to 12/4 at 3:30 with Dr Candyce SMS. Sending letter dt 9/19

## 2024-01-15 ENCOUNTER — Other Ambulatory Visit: Payer: Self-pay

## 2024-01-15 ENCOUNTER — Ambulatory Visit

## 2024-01-15 DIAGNOSIS — Z6835 Body mass index (BMI) 35.0-35.9, adult: Secondary | ICD-10-CM | POA: Insufficient documentation

## 2024-01-15 DIAGNOSIS — G479 Sleep disorder, unspecified: Secondary | ICD-10-CM | POA: Insufficient documentation

## 2024-01-20 ENCOUNTER — Encounter (HOSPITAL_BASED_OUTPATIENT_CLINIC_OR_DEPARTMENT_OTHER): Payer: Self-pay | Admitting: Critical Care Medicine

## 2024-01-20 DIAGNOSIS — G4733 Obstructive sleep apnea (adult) (pediatric): Secondary | ICD-10-CM | POA: Diagnosis not present

## 2024-01-20 NOTE — Progress Notes (Signed)
 Sleep Study Findings:  *Please see below regarding communication of results to patient, next steps, and other helpful information.      Recent sleep study demonstrated:mild sleep apnea.        Results of study and next steps will need to be communicated to patient by ordering provider.    Thanks to all.  Ordering provider cc'd here.        *The results of this patient's recent sleep study are now available in the Cardio/Pulm/Neuro tab (in-lab studies) or will soon be available scanned under the Media tab (home sleep studies).  The major findings are summarized above. The full report can be reviewed with additional details.    These results have not been communicated to the patient, and are now available for the ordering physician to go over with the patient and discuss management.  The sleep team is unable to review the results of the study with every patient unless they are already followed in sleep clinic. Sleep lab will assist with booking studies and facilitating CPAP equipment referrals, however lab is unable to address questions about report findings. These should be discussed with patient by ordering team; patient can also be offered a sleep clinic appointment for additional input.     If OSA is diagnosed, the sleep lab will help facilitate a CPAP titration study if needed (eg could not be completed on diagnostic night due to time constraints). Once insurance approval is obtained for the CPAP study, the sleep lab will call the patient to schedule. Patients may also call the sleep lab at [phone] to schedule the CPAP titration study if desired.  Some insurance plans may not approve a CPAP titration study, in which case autoset CPAP device is typically approved by insurance. Sleep team will assist with this prescription if needed.    If CPAP has already been titrated (eg split night study or CPAP titration study), sleep team will order CPAP equipment to prevent delays in starting therapy. This typically  takes a few weeks to arrange. Patients should be counseled to expect a call from a DME company to set up equipment at their home.    Please note that some health plans may not cover CPAP therapy ([employer], [employer])--patients will be offered self-pay for refurbished machine. Alternatives (such as basic machine through a non-profit foundation) may be available--if desired please refer patient to sleep clinic to discuss how to obtain this.  Medicare patients require a face-to-face visit between 31-90 days after CPAP set up to document use and benefit, in order for ongoing insurance coverage of therapy.     For patients diagnosed with OSA, a sleep clinic appointment will be automatically attempted to be scheduled after CPAP equipment is set-up, in an effort to ensure necessary documentation for ongoing CPAP therapy is completed.  Mask clinic is available at [facility] if needed for mask fitting. Please contact sleep lab Epic pool to ask about this.    If additional input is desired earlier in the process, sleep consultation can be obtained at any time through a referral to sleep clinic, and questions about the process can be directed to the Sleep Lab EPIC message pool.  Thanks to all.

## 2024-01-21 ENCOUNTER — Encounter (HOSPITAL_BASED_OUTPATIENT_CLINIC_OR_DEPARTMENT_OTHER): Payer: Self-pay

## 2024-01-21 DIAGNOSIS — G4733 Obstructive sleep apnea (adult) (pediatric): Secondary | ICD-10-CM | POA: Insufficient documentation

## 2024-01-24 ENCOUNTER — Telehealth (HOSPITAL_BASED_OUTPATIENT_CLINIC_OR_DEPARTMENT_OTHER): Payer: Self-pay | Admitting: Registered Nurse

## 2024-01-24 NOTE — Telephone Encounter (Signed)
 Spoke with pt  Relayed mild sleep apnea results  Gave number to call and schedule titration  Advised she discuss with Endo about GLP1 coverage  Pt verbalized understanding  No further questions    Deborah Fonder, RN, 01/24/2024 1:00 PM      Good morning,     Please let Hadassah File know that she does have mild sleep apnea. This can be treated with a mask attached to a machine that she wears at night to improve her breathing, called a CPAP. If she is interested, she can call the sleep lab at [phone] to schedule the CPAP titration study.     Because the sleep apnea is mild, it will not help us  get a GLP1 covered for her by her insurance, unfortunately. She should follow up with the endocrinologist for weight management as scheduled, looks like she has an appointment for December.     Thank you,   Lauraine Castle, CNP

## 2024-02-06 ENCOUNTER — Telehealth (HOSPITAL_BASED_OUTPATIENT_CLINIC_OR_DEPARTMENT_OTHER): Payer: Self-pay

## 2024-02-06 NOTE — Telephone Encounter (Signed)
 Hello,     Trulicity  is only covered for the diagnosis of Type 2 Diabetes.     Please review and let central refill know how you would like to proceed.

## 2024-02-07 NOTE — Telephone Encounter (Signed)
 Looks like PCP already placed referral 8/11.  Please provide number for scheduling  Thanks,  C

## 2024-02-07 NOTE — Telephone Encounter (Signed)
 Covering for PCP - please inform pt unable to get GLP1, can refer to endo for further discussion re: non-GLP1 treatment options for obesity if pt is agreeable.   Thanks     Spoke with pt  Relayed information  Would like referral    Pieter Roers, RN, 02/07/2024

## 2024-02-07 NOTE — Telephone Encounter (Signed)
 Spoke with pt and gave her number to call and schedule Endo appt  To call back if any further questions    Nena Fonder, RN, 02/07/2024 12:49 PM

## 2024-02-12 ENCOUNTER — Other Ambulatory Visit (HOSPITAL_BASED_OUTPATIENT_CLINIC_OR_DEPARTMENT_OTHER): Payer: Self-pay

## 2024-02-12 DIAGNOSIS — M170 Bilateral primary osteoarthritis of knee: Secondary | ICD-10-CM

## 2024-02-12 NOTE — Telephone Encounter (Signed)
 PER who is calling: Pharmacy, Deborah [name] is a 58 year old female has requested a refill of diclofenac .      Last Office Visit: 11/19/2023 Lannie Domino, CNP     Last Physical Exam: 08/14/2022    There are no preventive care reminders to display for this patient.    Other Med Adult:  Most Recent BP Reading(s)  11/19/23 : 102/69        Cholesterol (mg/dL)   Date Value   95/76/7974 196     LOW DENSITY LIPOPROTEIN DIRECT (mg/dL)   Date Value   95/76/7974 124     HIGH DENSITY LIPOPROTEIN (mg/dL)   Date Value   95/76/7974 46     TRIGLYCERIDES (mg/dL)   Date Value   95/76/7974 200 (H)         THYROID  SCREEN TSH REFLEX FT4 (uIU/mL)   Date Value   08/14/2022 2.280         TSH (THYROID  STIM HORMONE) (uIU/mL)   Date Value   12/22/2010 1.31       HEMOGLOBIN A1C (%)   Date Value   11/19/2023 5.4       No results found for: POCA1C      INR (no units)   Date Value   06/15/2008 1.0 (L)       SODIUM (mmol/L)   Date Value   08/14/2022 141       POTASSIUM (mmol/L)   Date Value   08/14/2022 4.5           CREATININE (mg/dL)   Date Value   94/93/7975 0.8       Documented patient preferred pharmacies:    [facility] [place], [place] - [address]  Phone: [phone] Fax: [phone]

## 2024-02-18 ENCOUNTER — Ambulatory Visit: Admitting: Ophthalmology

## 2024-03-07 ENCOUNTER — Ambulatory Visit: Admitting: Endocrinology

## 2024-03-13 ENCOUNTER — Ambulatory Visit

## 2024-03-28 ENCOUNTER — Ambulatory Visit

## 2024-04-07 ENCOUNTER — Ambulatory Visit: Admitting: Ophthalmology

## 2024-04-07 ENCOUNTER — Encounter (HOSPITAL_BASED_OUTPATIENT_CLINIC_OR_DEPARTMENT_OTHER): Payer: Self-pay | Admitting: Ophthalmology

## 2024-04-07 ENCOUNTER — Other Ambulatory Visit: Payer: Self-pay

## 2024-04-07 ENCOUNTER — Ambulatory Visit (HOSPITAL_BASED_OUTPATIENT_CLINIC_OR_DEPARTMENT_OTHER): Admitting: Ophthalmology

## 2024-04-07 DIAGNOSIS — H5203 Hypermetropia, bilateral: Secondary | ICD-10-CM | POA: Insufficient documentation

## 2024-04-07 DIAGNOSIS — H2513 Age-related nuclear cataract, bilateral: Secondary | ICD-10-CM | POA: Diagnosis present

## 2024-04-07 DIAGNOSIS — H524 Presbyopia: Secondary | ICD-10-CM | POA: Insufficient documentation

## 2024-04-07 DIAGNOSIS — H539 Unspecified visual disturbance: Secondary | ICD-10-CM | POA: Diagnosis present

## 2024-04-07 DIAGNOSIS — H31012 Macula scars of posterior pole (postinflammatory) (post-traumatic), left eye: Secondary | ICD-10-CM | POA: Diagnosis not present

## 2024-04-07 DIAGNOSIS — H52203 Unspecified astigmatism, bilateral: Secondary | ICD-10-CM | POA: Insufficient documentation

## 2024-04-07 DIAGNOSIS — H04123 Dry eye syndrome of bilateral lacrimal glands: Secondary | ICD-10-CM | POA: Diagnosis present

## 2024-04-07 MED ORDER — CARBOXYMETHYLCELLULOSE SODIUM 0.5 % OP SOLN: 1.0000 [drp] | Freq: Four times a day (QID) | OPHTHALMIC | 6 refills | Status: AC

## 2024-04-07 NOTE — Progress Notes (Signed)
 Deborah [name] is a 58 year old female  Here for Cataract f/u,H/O Hyperopia of both eyes with astigmatism and presbyopia,Macular chorioretinal scar of left eye,Dry eye.             C/O Burning sensation OU.  Tearing OU.  Pain off/on.  Blurry vision off/on.  Floaters OS.  No light flashes.    Ocular med  Refresh tears PRN

## 2024-04-07 NOTE — Progress Notes (Addendum)
 Patient presents with:    Patient presents with:  Flashes: Two days ago, she had jagged flashes of light, in the left side of her peripheral vision, lasting approximately 15 minutes, then resolved. No headach.  No ophthalmic cause or clue. This is typical of aura of migraine, without headache.  Follow. If recurs, follow up with PCP.    Burning Eyes: Her eyes are sometimes burning, occasionally stabbing pain, and uncomfortable, but does better when uses artificial tears. Using only occasionally, not daily.  She has dysfunctional tear syndrome (also known as dry eye syndrome).  She will use artificial tears in both eyes, 4-6 times a day.      Other: She has history of macular scarring, left eye due to remote uveitis, stable now, no change in her vision, for comprehensive eye exam. No additional therapy.    Hyperopia with astigmatism and presbyopia. She's given a prescription for glasses.

## 2024-05-06 ENCOUNTER — Ambulatory Visit: Admitting: Clinical Cardiac Electrophysiology

## 2024-05-08 ENCOUNTER — Other Ambulatory Visit (HOSPITAL_BASED_OUTPATIENT_CLINIC_OR_DEPARTMENT_OTHER): Payer: Self-pay

## 2024-05-09 ENCOUNTER — Ambulatory Visit: Payer: Self-pay

## 2024-05-09 ENCOUNTER — Other Ambulatory Visit: Payer: Self-pay

## 2024-05-09 ENCOUNTER — Other Ambulatory Visit (HOSPITAL_BASED_OUTPATIENT_CLINIC_OR_DEPARTMENT_OTHER): Payer: Self-pay

## 2024-05-09 ENCOUNTER — Encounter (HOSPITAL_BASED_OUTPATIENT_CLINIC_OR_DEPARTMENT_OTHER): Payer: Self-pay

## 2024-05-09 ENCOUNTER — Ambulatory Visit: Admitting: Clinical Cardiac Electrophysiology

## 2024-05-09 VITALS — BP 125/86 | HR 65 | Temp 97.1°F | Resp 16 | Ht 60.24 in | Wt 181.0 lb

## 2024-05-09 DIAGNOSIS — E669 Obesity, unspecified: Secondary | ICD-10-CM | POA: Diagnosis present

## 2024-05-09 MED ORDER — LIRAGLUTIDE 18 MG/3ML SC SOPN: 0.6000 mg | PEN_INJECTOR | Freq: Every day | SUBCUTANEOUS | 1 refills | Status: AC

## 2024-05-09 NOTE — Progress Notes (Signed)
 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     Interpreter ID#: [id number]    Pt was initially losing weight on dulaglutide , unfortunately, was told [employer] no longer covers Trulicity  or any other weight loss medication. Stopped Trulicty and was able to reach 168 lbs (over 20 lb difference). Pt felt very good on it. Reported less back pain on it. Her stomach burning symptoms had gone away completely.     Lowest weight in adulthood:46 kg  Highest weight 85.9 kg  Current weight 82.1 kg    Diet:   Soda/juice: Coke 3-4 times a week.    Fast food: She eats out about 3-4 times a week (pizza), she works out and needs something quick.   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 She is not sure if it is due to her medication.      Meds  Current Outpatient Medications   Medication Instructions    carboxymethylcellulose (REFRESH TEARS) 0.5 % SOLN 1 drop, Both Eyes, 4 TIMES DAILY    cholecalciferol (VITAMIN D3) 2,000 Units, Oral, DAILY    diclofenac  (VOLTAREN ) 4 g, Topical, 4 TIMES DAILY    DULoxetine  (CYMBALTA ) 60 mg, Oral, DAILY    metoprolol  (TOPROL -XL) 100 mg, Oral, DAILY WITH DINNER    omeprazole  (PRILOSEC ) 20 mg, Oral, DAILY        Review of Systems  10-point review of systems  evaluated and negative except as mentioned in HPI.     Vital Signs  BP 125/86   Pulse 65   Temp 97.1 F (36.2 C) (Temporal)   Resp 16   Ht 5' 0.24 (1.53 m)   Wt 82.1 kg (181 lb)   LMP 10/17/2006   SpO2 99%   BMI 35.07 kg/m   Body mass index is 35.07 kg/m.     Physical Exam     General: alert, in no acute distress  Head: atraumatic  Ears: normal externally  Eyes: normal conjunctiva, normal EOM  Neck: normal on inspection, no neck vein distension, no lumps, masses.   Chest: normal chest expansion, normal effort.   Neuro: alert and oriented X3, no tremor  Psych: normal mood and affect.     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    Assessment & Plan  Obesity (BMI 30-39.9)  -vast majority of obesity are from imbalance of energy intake and expenditure influenced by environmental and behavioral patterns (sometimes there are genetic susceptibility)  -endocrine related causes include hypothyroidism, Cushing's syndrome or growth hormone deficiency  -Hypothyroidism ruled out; Low suspicion of Cushing's syndrome. Normal A1c.   -recommend aiming to implement lifestyle modification with healthy diet, increased activity and behavioral habits to maintain healthy weight  -aim for 5-10% weight loss in 6-12 months  -we discussed that pharmacologic options serve as adjunct therapy to lifestyle modification. If there is lack of sufficient weight loss with lifestyle modification, those >73 years old can be considered to be started on GLP-1 RA for adjunctive therapy.  However, weight regain is common after  its discontinuation.  -Labs obtained by PCP: A1c normal as above  -will obtain TSH to rule out hypothyroidism if clinically indicated  -increase physical activity with at least 30-60 mins of moderate to vigorous physical activity daily  -Recommend optimizing fiber and vegetable intake as well as lean proteins.  -pt has nutrition referral. Recommend to discuss higher fiber intake and management of hunger cues with changes to diet. Recommend eating at a calorie deficit.   Pharmacologic options:  -would not recommend phentermine/topiramate in the setting of SVT and kidney stones  -pt's insurance does not cover any weight loss medications  -Will proceed with Victoza  340b program, which would be approximately $25.00 for brand name.  Pt states that this is financially doable for her. Will be starting 0.6 mg daily for 2 weeks. If tolerating, can increase to 1.2 mg daily for 2 weeks.   -At next visit, will assess weight loss, can increase to 1.8 mg daily for 2 weeks if plateau. If tolerating, can increase to 2.4 mg daily (would require 0.6 mg and 1.8 mg combination, which would be 2 injections) for 2 weeks. If tolerating, can increase to 3 mg daily (1.2 mg and 1.8 mg daily). Discussed decreasing oral intake by at least 25% to decrease risk of experiencing adverse effects. Discussed side effects of nausea, vomiting, abd pain, bloating, diarrhea and constipation. If severe, recommend stopping medication and contact clinic. Pt has no contraindications to take this medication.   -recommend intake of water of 2.7L water on average daily.      Follow up:     3 months     Nat Donalds, DO  Division of Endocrinology  Time spent = 45 minutes (greater than 50% time devoted to counseling, education, coordination of care, evaluating and interpreting test results and reviewing chart).

## 2024-05-09 NOTE — Telephone Encounter (Signed)
 PER who is calling: Pharmacy, Shiree Altemus is a 59 year old female has requested a refill of       Insulin  pen needles.        Only complete for controlled medication:    Previously prescribed:  Start Date          End Date              Last Office Visit  : 05/09/2024 Candyce Garre, DO      Last Tele Visit:  Visit date not found      Last Physical Exam:  08/14/2022    There are no preventive care reminders to display for this patient.    Other Med Adult:  Most Recent BP Reading(s)  05/09/24 : 125/86        Cholesterol (mg/dL)   Date Value   95/76/7974 196     LOW DENSITY LIPOPROTEIN DIRECT (mg/dL)   Date Value   95/76/7974 124     HIGH DENSITY LIPOPROTEIN (mg/dL)   Date Value   95/76/7974 46     TRIGLYCERIDES (mg/dL)   Date Value   95/76/7974 200 (H)         THYROID  SCREEN TSH REFLEX FT4 (uIU/mL)   Date Value   08/14/2022 2.280         TSH (THYROID  STIM HORMONE) (uIU/mL)   Date Value   12/22/2010 1.31       HEMOGLOBIN A1C (%)   Date Value   11/19/2023 5.4       No results found for: POCA1C      INR (no units)   Date Value   06/15/2008 1.0 (L)       SODIUM (mmol/L)   Date Value   08/14/2022 141       POTASSIUM (mmol/L)   Date Value   08/14/2022 4.5           CREATININE (mg/dL)   Date Value   94/93/7975 0.8       Documented patient preferred pharmacies:    [facility] [place], [place] - [address]  Phone: [phone] Fax: [phone]

## 2024-05-12 MED ORDER — INSULIN PEN NEEDLE 32G X 4 MM MISC: 3 refills | Status: AC

## 2024-06-27 ENCOUNTER — Ambulatory Visit: Admitting: Clinical Cardiac Electrophysiology

## 2024-08-08 ENCOUNTER — Ambulatory Visit
